# Patient Record
Sex: Male | Born: 1945
Health system: Southern US, Community
[De-identification: ages and names within clinical notes are randomized; demographics above are authoritative.]

## PROBLEM LIST (undated history)

## (undated) DIAGNOSIS — I2510 Atherosclerotic heart disease of native coronary artery without angina pectoris: Secondary | ICD-10-CM

## (undated) DIAGNOSIS — M109 Gout, unspecified: Secondary | ICD-10-CM

## (undated) DIAGNOSIS — M069 Rheumatoid arthritis, unspecified: Secondary | ICD-10-CM

## (undated) DIAGNOSIS — Z23 Encounter for immunization: Secondary | ICD-10-CM

## (undated) DIAGNOSIS — Z8249 Family history of ischemic heart disease and other diseases of the circulatory system: Secondary | ICD-10-CM

## (undated) DIAGNOSIS — R000 Tachycardia, unspecified: Secondary | ICD-10-CM

## (undated) DIAGNOSIS — I499 Cardiac arrhythmia, unspecified: Secondary | ICD-10-CM

## (undated) DIAGNOSIS — D649 Anemia, unspecified: Secondary | ICD-10-CM

## (undated) DIAGNOSIS — M62830 Muscle spasm of back: Secondary | ICD-10-CM

## (undated) DIAGNOSIS — R51 Headache: Secondary | ICD-10-CM

## (undated) DIAGNOSIS — I10 Essential (primary) hypertension: Secondary | ICD-10-CM

## (undated) DIAGNOSIS — D4620 Refractory anemia with excess of blasts, unspecified: Secondary | ICD-10-CM

## (undated) DIAGNOSIS — R0602 Shortness of breath: Secondary | ICD-10-CM

## (undated) HISTORY — DX: Headache: R51

## (undated) HISTORY — PX: KNEE ARTHROSCOPY: SHX127

## (undated) HISTORY — DX: Encounter for immunization: Z23

## (undated) HISTORY — DX: Rheumatoid arthritis, unspecified: M06.9

## (undated) HISTORY — PX: UMBILICAL HERNIA REPAIR: SHX196

## (undated) HISTORY — PX: CIRCUMCISION: SUR203

## (undated) HISTORY — DX: Muscle spasm of back: M62.830

## (undated) HISTORY — DX: Refractory anemia with excess of blasts, unspecified: D46.20

## (undated) HISTORY — PX: HERNIA REPAIR: SHX51

---

## 1999-04-08 ENCOUNTER — Encounter: Admission: RE | Admit: 1999-04-08 | Discharge: 1999-04-22 | Payer: Self-pay | Admitting: *Deleted

## 2007-12-21 ENCOUNTER — Encounter: Admission: RE | Admit: 2007-12-21 | Discharge: 2007-12-21 | Payer: Self-pay | Admitting: General Surgery

## 2007-12-22 ENCOUNTER — Ambulatory Visit (HOSPITAL_BASED_OUTPATIENT_CLINIC_OR_DEPARTMENT_OTHER): Admission: RE | Admit: 2007-12-22 | Discharge: 2007-12-22 | Payer: Self-pay | Admitting: General Surgery

## 2008-04-23 ENCOUNTER — Encounter: Admission: RE | Admit: 2008-04-23 | Discharge: 2008-04-23 | Payer: Self-pay | Admitting: Emergency Medicine

## 2008-06-11 ENCOUNTER — Encounter: Admission: RE | Admit: 2008-06-11 | Discharge: 2008-06-11 | Payer: Self-pay | Admitting: Rheumatology

## 2010-12-06 DIAGNOSIS — D4620 Refractory anemia with excess of blasts, unspecified: Secondary | ICD-10-CM

## 2010-12-06 DIAGNOSIS — D46Z Other myelodysplastic syndromes: Secondary | ICD-10-CM

## 2010-12-06 HISTORY — DX: Refractory anemia with excess of blasts, unspecified: D46.20

## 2010-12-06 HISTORY — DX: Other myelodysplastic syndromes: D46.Z

## 2011-02-26 ENCOUNTER — Other Ambulatory Visit: Payer: Self-pay | Admitting: Oncology

## 2011-02-26 ENCOUNTER — Encounter: Payer: Self-pay | Admitting: Oncology

## 2011-02-26 ENCOUNTER — Encounter (HOSPITAL_BASED_OUTPATIENT_CLINIC_OR_DEPARTMENT_OTHER): Payer: 59 | Admitting: Oncology

## 2011-02-26 DIAGNOSIS — D649 Anemia, unspecified: Secondary | ICD-10-CM

## 2011-02-26 LAB — CBC & DIFF AND RETIC
Basophils Absolute: 0 10*3/uL (ref 0.0–0.1)
Eosinophils Absolute: 0 10*3/uL (ref 0.0–0.5)
HCT: 30.8 % — ABNORMAL LOW (ref 38.4–49.9)
HGB: 10.3 g/dL — ABNORMAL LOW (ref 13.0–17.1)
Immature Retic Fract: 12.5 % (ref 0.00–13.40)
LYMPH%: 25.3 % (ref 14.0–49.0)
MONO#: 0.3 10*3/uL (ref 0.1–0.9)
NEUT#: 2.8 10*3/uL (ref 1.5–6.5)
NEUT%: 66.8 % (ref 39.0–75.0)
Platelets: 118 10*3/uL — ABNORMAL LOW (ref 140–400)
WBC: 4.2 10*3/uL (ref 4.0–10.3)

## 2011-02-26 LAB — MORPHOLOGY

## 2011-03-02 LAB — ERYTHROPOIETIN: Erythropoietin: 90.3 m[IU]/mL — ABNORMAL HIGH (ref 2.6–34.0)

## 2011-03-02 LAB — COMPREHENSIVE METABOLIC PANEL
Albumin: 4.8 g/dL (ref 3.5–5.2)
Alkaline Phosphatase: 61 U/L (ref 39–117)
Glucose, Bld: 121 mg/dL — ABNORMAL HIGH (ref 70–99)
Potassium: 3.9 mEq/L (ref 3.5–5.3)
Sodium: 141 mEq/L (ref 135–145)
Total Protein: 7.5 g/dL (ref 6.0–8.3)

## 2011-03-02 LAB — FOLATE: Folate: >20 ng/mL

## 2011-03-02 LAB — IRON AND TIBC: Iron: 169 ug/dL — ABNORMAL HIGH (ref 42–165)

## 2011-03-02 LAB — FERRITIN: Ferritin: 269 ng/mL (ref 22–322)

## 2011-03-02 LAB — METHYLMALONIC ACID, SERUM: Methylmalonic Acid, Quantitative: 177 nmol/L (ref 87–318)

## 2011-03-02 LAB — HAPTOGLOBIN: Haptoglobin: 90 mg/dL (ref 16–200)

## 2011-03-02 LAB — VITAMIN B12: Vitamin B-12: 358 pg/mL (ref 211–911)

## 2011-04-16 ENCOUNTER — Other Ambulatory Visit (HOSPITAL_COMMUNITY)
Admission: RE | Admit: 2011-04-16 | Discharge: 2011-04-16 | Disposition: A | Discharge status: Home / Self Care | Payer: 59 | Source: Ambulatory Visit | Attending: Oncology | Admitting: Oncology

## 2011-04-16 ENCOUNTER — Other Ambulatory Visit: Payer: Self-pay | Admitting: Oncology

## 2011-04-16 ENCOUNTER — Encounter (HOSPITAL_BASED_OUTPATIENT_CLINIC_OR_DEPARTMENT_OTHER): Payer: 59 | Admitting: Oncology

## 2011-04-16 DIAGNOSIS — D61818 Other pancytopenia: Secondary | ICD-10-CM

## 2011-04-16 DIAGNOSIS — D649 Anemia, unspecified: Secondary | ICD-10-CM

## 2011-04-16 LAB — CBC WITH DIFFERENTIAL/PLATELET
Basophils Absolute: 0 10*3/uL (ref 0.0–0.1)
Eosinophils Absolute: 0 10*3/uL (ref 0.0–0.5)
HGB: 9.4 g/dL — ABNORMAL LOW (ref 13.0–17.1)
NEUT#: 2.4 10*3/uL (ref 1.5–6.5)
RDW: 25.6 % — ABNORMAL HIGH (ref 11.0–14.6)
WBC: 3.7 10*3/uL — ABNORMAL LOW (ref 4.0–10.3)
lymph#: 1 10*3/uL (ref 0.9–3.3)
nRBC: 0 % (ref 0–0)

## 2011-04-16 LAB — CHCC SMEAR

## 2011-04-20 ENCOUNTER — Encounter (HOSPITAL_BASED_OUTPATIENT_CLINIC_OR_DEPARTMENT_OTHER): Payer: 59 | Admitting: Oncology

## 2011-04-20 DIAGNOSIS — D469 Myelodysplastic syndrome, unspecified: Secondary | ICD-10-CM

## 2011-04-20 DIAGNOSIS — I10 Essential (primary) hypertension: Secondary | ICD-10-CM

## 2011-04-20 DIAGNOSIS — M069 Rheumatoid arthritis, unspecified: Secondary | ICD-10-CM

## 2011-04-20 NOTE — Op Note (Signed)
NAME:  Eric [name], Eric [name] NO.:  [id number]   MEDICAL RECORD NO.:  [id number]          PATIENT TYPE:  AMB   LOCATION:  DSC                          FACILITY:  [facility]   PHYSICIAN:  Adolph Pollack, M.D.DATE OF BIRTH:  01/21/1946   DATE OF PROCEDURE:  12/22/2007  DATE OF DISCHARGE:                               OPERATIVE REPORT   PREOPERATIVE DIAGNOSIS:  Chronically incarcerated umbilical hernia.   POSTOPERATIVE DIAGNOSIS:  Chronically incarcerated umbilical hernia.   PROCEDURE:  Umbilical hernia repair with mesh.   SURGEON:  Adolph Pollack, M.D.   ANESTHESIA:  General.   INDICATIONS:  This is 64 year old male with an umbilical hernia.  It has  become somewhat larger in size and has tenting of the skin.  He now  presents for repair.  We discussed the procedure, risks and aftercare  preoperatively.   TECHNIQUE:  He is seen in the holding area and brought to the operating  room, placed supine on the operating table and general anesthetic was  administered.  The hair in the periumbilical area was clipped and the  area was sterilely prepped and draped.  Marcaine 0.5% plain Marcaine  solution was infiltrated in the periumbilical region superficially and  deep.  A subumbilical curvilinear incision was made through skin and  subcutaneous tissues until the fascia was identified.  The umbilicus was  encircled and dissected free from the fascia after I was able to reduce  the contents of the umbilical hernia.  This exposed the fascial defect.  Using electrocautery I dissected subcutaneous tissue off the fascia for  distance of 3-4 cm around the defect.  The fascial defect was then  primarily closed with 0-0 Surgilon sutures left long.  A piece of  polypropylene mesh brought to the field and cut to appropriate size to  allow for 3-4 cm overlap.  The primary repair sutures were threaded up  through the mesh and tied down anchoring the mesh directly over the  primary  repair.  The periphery of the mesh was then anchored to the  fascia with a running 0-0 Prolene suture.  Local anesthetic was then  infiltrated into the fascia.   Following this hemostasis was adequate.  The umbilicus was reimplanted  on the mesh with 3-0 Vicryl suture.  The subcutaneous tissue was then  closed over the mesh with a running 3-0 Vicryl suture.  The skin was  closed with 4-0 Monocryl subcuticular stitch followed by Steri-Strips  and sterile dressings.   He tolerated the procedure without apparent complications was taken  recovery in satisfactory condition.     Adolph Pollack, M.D.  Electronically Signed    TJR/MEDQ  D:  12/22/2007  T:  12/22/2007  Job:  [id number]   cc:   Oley Balm. Georgina Pillion, M.D.

## 2011-04-22 LAB — HEMOCHROMATOSIS DNA-PCR(C282Y,H63D)

## 2011-05-14 ENCOUNTER — Other Ambulatory Visit: Payer: Self-pay | Admitting: Oncology

## 2011-05-14 ENCOUNTER — Encounter (HOSPITAL_BASED_OUTPATIENT_CLINIC_OR_DEPARTMENT_OTHER): Payer: 59 | Admitting: Oncology

## 2011-05-14 DIAGNOSIS — I10 Essential (primary) hypertension: Secondary | ICD-10-CM

## 2011-05-14 DIAGNOSIS — D469 Myelodysplastic syndrome, unspecified: Secondary | ICD-10-CM

## 2011-05-14 DIAGNOSIS — M069 Rheumatoid arthritis, unspecified: Secondary | ICD-10-CM

## 2011-05-14 DIAGNOSIS — D696 Thrombocytopenia, unspecified: Secondary | ICD-10-CM

## 2011-05-14 DIAGNOSIS — D649 Anemia, unspecified: Secondary | ICD-10-CM

## 2011-05-14 LAB — CBC WITH DIFFERENTIAL/PLATELET
BASO%: 0.2 % (ref 0.0–2.0)
Basophils Absolute: 0 10*3/uL (ref 0.0–0.1)
EOS%: 0 % (ref 0.0–7.0)
HCT: 27.9 % — ABNORMAL LOW (ref 38.4–49.9)
LYMPH%: 26.2 % (ref 14.0–49.0)
MCH: 26.1 pg — ABNORMAL LOW (ref 27.2–33.4)
MCHC: 33.3 g/dL (ref 32.0–36.0)
MCV: 78.2 fL — ABNORMAL LOW (ref 79.3–98.0)
MONO%: 4.4 % (ref 0.0–14.0)
NEUT%: 69.2 % (ref 39.0–75.0)
Platelets: 100 10*3/uL — ABNORMAL LOW (ref 140–400)
lymph#: 1.1 10*3/uL (ref 0.9–3.3)

## 2011-05-28 ENCOUNTER — Encounter (HOSPITAL_BASED_OUTPATIENT_CLINIC_OR_DEPARTMENT_OTHER): Payer: 59 | Admitting: Oncology

## 2011-05-28 ENCOUNTER — Other Ambulatory Visit: Payer: Self-pay | Admitting: Oncology

## 2011-05-28 DIAGNOSIS — M069 Rheumatoid arthritis, unspecified: Secondary | ICD-10-CM

## 2011-05-28 DIAGNOSIS — I10 Essential (primary) hypertension: Secondary | ICD-10-CM

## 2011-05-28 DIAGNOSIS — D469 Myelodysplastic syndrome, unspecified: Secondary | ICD-10-CM

## 2011-05-28 LAB — CBC WITH DIFFERENTIAL/PLATELET
BASO%: 0.4 % (ref 0.0–2.0)
HCT: 29.6 % — ABNORMAL LOW (ref 38.4–49.9)
LYMPH%: 18 % (ref 14.0–49.0)
MCH: 26.1 pg — ABNORMAL LOW (ref 27.2–33.4)
MCHC: 33.4 g/dL (ref 32.0–36.0)
MCV: 78.1 fL — ABNORMAL LOW (ref 79.3–98.0)
MONO%: 4.9 % (ref 0.0–14.0)
NEUT%: 76.7 % — ABNORMAL HIGH (ref 39.0–75.0)
Platelets: 105 10*3/uL — ABNORMAL LOW (ref 140–400)
RBC: 3.79 10*6/uL — ABNORMAL LOW (ref 4.20–5.82)
nRBC: 0 % (ref 0–0)

## 2011-06-11 ENCOUNTER — Other Ambulatory Visit: Payer: Self-pay | Admitting: Oncology

## 2011-06-11 ENCOUNTER — Encounter (HOSPITAL_BASED_OUTPATIENT_CLINIC_OR_DEPARTMENT_OTHER): Payer: 59 | Admitting: Oncology

## 2011-06-11 DIAGNOSIS — I10 Essential (primary) hypertension: Secondary | ICD-10-CM

## 2011-06-11 DIAGNOSIS — D469 Myelodysplastic syndrome, unspecified: Secondary | ICD-10-CM

## 2011-06-11 DIAGNOSIS — M069 Rheumatoid arthritis, unspecified: Secondary | ICD-10-CM

## 2011-06-11 LAB — CBC WITH DIFFERENTIAL/PLATELET
Basophils Absolute: 0 10*3/uL (ref 0.0–0.1)
EOS%: 0 % (ref 0.0–7.0)
Eosinophils Absolute: 0 10*3/uL (ref 0.0–0.5)
HCT: 31 % — ABNORMAL LOW (ref 38.4–49.9)
HGB: 10.3 g/dL — ABNORMAL LOW (ref 13.0–17.1)
MCH: 26.2 pg — ABNORMAL LOW (ref 27.2–33.4)
MCV: 78.9 fL — ABNORMAL LOW (ref 79.3–98.0)
MONO%: 5.6 % (ref 0.0–14.0)
NEUT#: 3.6 10*3/uL (ref 1.5–6.5)
NEUT%: 66.1 % (ref 39.0–75.0)

## 2011-06-25 ENCOUNTER — Encounter (HOSPITAL_BASED_OUTPATIENT_CLINIC_OR_DEPARTMENT_OTHER): Payer: 59 | Admitting: Oncology

## 2011-06-25 ENCOUNTER — Other Ambulatory Visit: Payer: Self-pay | Admitting: Oncology

## 2011-06-25 DIAGNOSIS — I10 Essential (primary) hypertension: Secondary | ICD-10-CM

## 2011-06-25 DIAGNOSIS — D469 Myelodysplastic syndrome, unspecified: Secondary | ICD-10-CM

## 2011-06-25 DIAGNOSIS — M069 Rheumatoid arthritis, unspecified: Secondary | ICD-10-CM

## 2011-06-25 LAB — CBC WITH DIFFERENTIAL/PLATELET
BASO%: 0.2 % (ref 0.0–2.0)
Basophils Absolute: 0 10*3/uL (ref 0.0–0.1)
HCT: 31.1 % — ABNORMAL LOW (ref 38.4–49.9)
HGB: 10.5 g/dL — ABNORMAL LOW (ref 13.0–17.1)
LYMPH%: 23 % (ref 14.0–49.0)
MCH: 26.4 pg — ABNORMAL LOW (ref 27.2–33.4)
MCHC: 33.8 g/dL (ref 32.0–36.0)
MONO#: 0.5 10*3/uL (ref 0.1–0.9)
NEUT%: 67.1 % (ref 39.0–75.0)
Platelets: 91 10*3/uL — ABNORMAL LOW (ref 140–400)
WBC: 5.2 10*3/uL (ref 4.0–10.3)
lymph#: 1.2 10*3/uL (ref 0.9–3.3)

## 2011-07-05 ENCOUNTER — Other Ambulatory Visit: Payer: Self-pay | Admitting: Oncology

## 2011-07-05 ENCOUNTER — Encounter (HOSPITAL_BASED_OUTPATIENT_CLINIC_OR_DEPARTMENT_OTHER): Payer: 59 | Admitting: Oncology

## 2011-07-05 DIAGNOSIS — D469 Myelodysplastic syndrome, unspecified: Secondary | ICD-10-CM

## 2011-07-05 DIAGNOSIS — D696 Thrombocytopenia, unspecified: Secondary | ICD-10-CM

## 2011-07-05 DIAGNOSIS — I10 Essential (primary) hypertension: Secondary | ICD-10-CM

## 2011-07-05 DIAGNOSIS — M069 Rheumatoid arthritis, unspecified: Secondary | ICD-10-CM

## 2011-07-05 LAB — CBC WITH DIFFERENTIAL/PLATELET
Basophils Absolute: 0 10*3/uL (ref 0.0–0.1)
Eosinophils Absolute: 0 10*3/uL (ref 0.0–0.5)
HCT: 31.9 % — ABNORMAL LOW (ref 38.4–49.9)
HGB: 10.8 g/dL — ABNORMAL LOW (ref 13.0–17.1)
LYMPH%: 31.7 % (ref 14.0–49.0)
MCV: 82.8 fL (ref 79.3–98.0)
MONO#: 0.3 10*3/uL (ref 0.1–0.9)
MONO%: 5 % (ref 0.0–14.0)
NEUT#: 3.3 10*3/uL (ref 1.5–6.5)
NEUT%: 62.8 % (ref 39.0–75.0)
Platelets: 80 10*3/uL — ABNORMAL LOW (ref 140–400)
RBC: 3.86 10*6/uL — ABNORMAL LOW (ref 4.20–5.82)
WBC: 5.2 10*3/uL (ref 4.0–10.3)

## 2011-07-05 LAB — COMPREHENSIVE METABOLIC PANEL
AST: 15 U/L (ref 0–37)
Albumin: 4.6 g/dL (ref 3.5–5.2)
BUN: 21 mg/dL (ref 6–23)
CO2: 26 mEq/L (ref 19–32)
Calcium: 9.4 mg/dL (ref 8.4–10.5)
Chloride: 100 mEq/L (ref 96–112)
Creatinine, Ser: 0.72 mg/dL (ref 0.50–1.35)
Glucose, Bld: 136 mg/dL — ABNORMAL HIGH (ref 70–99)
Potassium: 3.9 mEq/L (ref 3.5–5.3)

## 2011-07-05 LAB — CHCC SMEAR

## 2011-07-05 LAB — LACTATE DEHYDROGENASE: LDH: 176 U/L (ref 94–250)

## 2011-08-06 ENCOUNTER — Encounter (HOSPITAL_BASED_OUTPATIENT_CLINIC_OR_DEPARTMENT_OTHER): Payer: 59 | Admitting: Oncology

## 2011-08-06 ENCOUNTER — Other Ambulatory Visit: Payer: Self-pay | Admitting: Oncology

## 2011-08-06 DIAGNOSIS — M069 Rheumatoid arthritis, unspecified: Secondary | ICD-10-CM

## 2011-08-06 DIAGNOSIS — I10 Essential (primary) hypertension: Secondary | ICD-10-CM

## 2011-08-06 DIAGNOSIS — D696 Thrombocytopenia, unspecified: Secondary | ICD-10-CM

## 2011-08-06 DIAGNOSIS — D469 Myelodysplastic syndrome, unspecified: Secondary | ICD-10-CM

## 2011-08-06 LAB — CBC WITH DIFFERENTIAL/PLATELET
Basophils Absolute: 0 10*3/uL (ref 0.0–0.1)
EOS%: 0 % (ref 0.0–7.0)
Eosinophils Absolute: 0 10*3/uL (ref 0.0–0.5)
HGB: 9.8 g/dL — ABNORMAL LOW (ref 13.0–17.1)
MCV: 77.1 fL — ABNORMAL LOW (ref 79.3–98.0)
MONO%: 8.6 % (ref 0.0–14.0)
NEUT#: 2.7 10*3/uL (ref 1.5–6.5)
RBC: 3.8 10*6/uL — ABNORMAL LOW (ref 4.20–5.82)
RDW: 25.7 % — ABNORMAL HIGH (ref 11.0–14.6)
lymph#: 1.6 10*3/uL (ref 0.9–3.3)
nRBC: 1 % — ABNORMAL HIGH (ref 0–0)

## 2011-08-06 LAB — TECHNOLOGIST REVIEW

## 2011-08-26 LAB — DIFFERENTIAL
Basophils Relative: 1
Eosinophils Absolute: 0
Eosinophils Relative: 0
Monocytes Absolute: 0.2
Monocytes Relative: 5

## 2011-08-26 LAB — COMPREHENSIVE METABOLIC PANEL
ALT: 31
Albumin: 4.1
Alkaline Phosphatase: 78
Chloride: 98
Potassium: 4.5
Sodium: 133 — ABNORMAL LOW
Total Bilirubin: 1.2
Total Protein: 7.7

## 2011-08-26 LAB — CBC
Hemoglobin: 11.8 — ABNORMAL LOW
Platelets: 240
RDW: 19.9 — ABNORMAL HIGH
WBC: 4.5

## 2011-09-03 ENCOUNTER — Encounter (HOSPITAL_BASED_OUTPATIENT_CLINIC_OR_DEPARTMENT_OTHER): Payer: 59 | Admitting: Oncology

## 2011-09-03 ENCOUNTER — Other Ambulatory Visit: Payer: Self-pay | Admitting: Oncology

## 2011-09-03 DIAGNOSIS — I10 Essential (primary) hypertension: Secondary | ICD-10-CM

## 2011-09-03 DIAGNOSIS — D696 Thrombocytopenia, unspecified: Secondary | ICD-10-CM

## 2011-09-03 DIAGNOSIS — M069 Rheumatoid arthritis, unspecified: Secondary | ICD-10-CM

## 2011-09-03 DIAGNOSIS — D469 Myelodysplastic syndrome, unspecified: Secondary | ICD-10-CM

## 2011-09-03 DIAGNOSIS — D4620 Refractory anemia with excess of blasts, unspecified: Secondary | ICD-10-CM

## 2011-09-03 DIAGNOSIS — I011 Acute rheumatic endocarditis: Secondary | ICD-10-CM

## 2011-09-03 LAB — CBC WITH DIFFERENTIAL/PLATELET
BASO%: 0.2 % (ref 0.0–2.0)
Basophils Absolute: 0 10*3/uL (ref 0.0–0.1)
EOS%: 0 % (ref 0.0–7.0)
HGB: 9.1 g/dL — ABNORMAL LOW (ref 13.0–17.1)
MCH: 25.2 pg — ABNORMAL LOW (ref 27.2–33.4)
MCHC: 32.7 g/dL (ref 32.0–36.0)
MCV: 77 fL — ABNORMAL LOW (ref 79.3–98.0)
MONO%: 7.9 % (ref 0.0–14.0)
RBC: 3.61 10*6/uL — ABNORMAL LOW (ref 4.20–5.82)
RDW: 26.2 % — ABNORMAL HIGH (ref 11.0–14.6)

## 2011-09-03 LAB — TECHNOLOGIST REVIEW

## 2011-09-17 ENCOUNTER — Encounter: Payer: Self-pay | Admitting: Oncology

## 2011-09-17 DIAGNOSIS — M069 Rheumatoid arthritis, unspecified: Secondary | ICD-10-CM | POA: Insufficient documentation

## 2011-09-17 DIAGNOSIS — D4620 Refractory anemia with excess of blasts, unspecified: Secondary | ICD-10-CM | POA: Insufficient documentation

## 2011-09-19 ENCOUNTER — Encounter: Payer: Self-pay | Admitting: Oncology

## 2011-10-07 ENCOUNTER — Other Ambulatory Visit: Payer: Self-pay | Admitting: Oncology

## 2011-10-07 ENCOUNTER — Telehealth: Payer: Self-pay | Admitting: *Deleted

## 2011-10-07 ENCOUNTER — Encounter (HOSPITAL_BASED_OUTPATIENT_CLINIC_OR_DEPARTMENT_OTHER): Payer: 59 | Admitting: Oncology

## 2011-10-07 DIAGNOSIS — D696 Thrombocytopenia, unspecified: Secondary | ICD-10-CM

## 2011-10-07 DIAGNOSIS — M069 Rheumatoid arthritis, unspecified: Secondary | ICD-10-CM

## 2011-10-07 DIAGNOSIS — D469 Myelodysplastic syndrome, unspecified: Secondary | ICD-10-CM

## 2011-10-07 DIAGNOSIS — D649 Anemia, unspecified: Secondary | ICD-10-CM

## 2011-10-07 DIAGNOSIS — Z23 Encounter for immunization: Secondary | ICD-10-CM

## 2011-10-07 DIAGNOSIS — I10 Essential (primary) hypertension: Secondary | ICD-10-CM

## 2011-10-07 LAB — MORPHOLOGY: PLT EST: DECREASED

## 2011-10-07 LAB — CBC WITH DIFFERENTIAL/PLATELET
Basophils Absolute: 0 10*3/uL (ref 0.0–0.1)
EOS%: 0 % (ref 0.0–7.0)
HGB: 9.8 g/dL — ABNORMAL LOW (ref 13.0–17.1)
MCH: 25.5 pg — ABNORMAL LOW (ref 27.2–33.4)
MCV: 76.1 fL — ABNORMAL LOW (ref 79.3–98.0)
MONO%: 7.6 % (ref 0.0–14.0)
NEUT%: 63.9 % (ref 39.0–75.0)
RDW: 26.7 % — ABNORMAL HIGH (ref 11.0–14.6)

## 2011-10-07 LAB — COMPREHENSIVE METABOLIC PANEL
AST: 14 U/L (ref 0–37)
Alkaline Phosphatase: 57 U/L (ref 39–117)
BUN: 23 mg/dL (ref 6–23)
Creatinine, Ser: 0.8 mg/dL (ref 0.50–1.35)
Total Bilirubin: 0.7 mg/dL (ref 0.3–1.2)

## 2011-10-29 ENCOUNTER — Other Ambulatory Visit: Payer: 59

## 2011-11-06 ENCOUNTER — Other Ambulatory Visit: Payer: Self-pay | Admitting: Oncology

## 2011-11-19 ENCOUNTER — Other Ambulatory Visit: Payer: Self-pay | Admitting: Gastroenterology

## 2011-11-26 ENCOUNTER — Other Ambulatory Visit (HOSPITAL_BASED_OUTPATIENT_CLINIC_OR_DEPARTMENT_OTHER): Payer: 59 | Admitting: Lab

## 2011-11-26 ENCOUNTER — Other Ambulatory Visit: Payer: Self-pay | Admitting: Oncology

## 2011-11-26 DIAGNOSIS — I10 Essential (primary) hypertension: Secondary | ICD-10-CM

## 2011-11-26 DIAGNOSIS — M069 Rheumatoid arthritis, unspecified: Secondary | ICD-10-CM

## 2011-11-26 DIAGNOSIS — D696 Thrombocytopenia, unspecified: Secondary | ICD-10-CM

## 2011-11-26 DIAGNOSIS — D469 Myelodysplastic syndrome, unspecified: Secondary | ICD-10-CM

## 2011-11-26 LAB — CBC WITH DIFFERENTIAL/PLATELET
Basophils Absolute: 0 10*3/uL (ref 0.0–0.1)
EOS%: 0 % (ref 0.0–7.0)
HCT: 29.4 % — ABNORMAL LOW (ref 38.4–49.9)
HGB: 9.8 g/dL — ABNORMAL LOW (ref 13.0–17.1)
MCH: 24.9 pg — ABNORMAL LOW (ref 27.2–33.4)
MCV: 74.6 fL — ABNORMAL LOW (ref 79.3–98.0)
MONO%: 9.2 % (ref 0.0–14.0)
NEUT%: 56.2 % (ref 39.0–75.0)
lymph#: 2.2 10*3/uL (ref 0.9–3.3)

## 2011-12-07 DIAGNOSIS — R000 Tachycardia, unspecified: Secondary | ICD-10-CM

## 2011-12-07 HISTORY — DX: Tachycardia, unspecified: R00.0

## 2011-12-10 ENCOUNTER — Telehealth: Payer: Self-pay | Admitting: Oncology

## 2011-12-10 ENCOUNTER — Telehealth: Payer: Self-pay | Admitting: *Deleted

## 2011-12-10 ENCOUNTER — Other Ambulatory Visit: Payer: Self-pay | Admitting: Oncology

## 2011-12-10 NOTE — Telephone Encounter (Signed)
Called pt's home and spoke w/ his wife.  Informed of Feb appt canceled and Lab for 12/13/11 also canceled since pt just had lab 2 weeks ago.  Informed of next lab appt on 02/07/12 and scheduling will call to make appt for labs and to see [name] again in May. Instructed to call for any questions or concerns prior to next appt..  Wife verbalized understanding.

## 2011-12-10 NOTE — Telephone Encounter (Signed)
Message from pt states he was called w/ appt to see [name] in February but he thought [name] told him he didn't need to see him again for 6 months from his last visit?  Pt's last visit was 10/12/11.  Note to [name] for clarification.

## 2011-12-10 NOTE — Telephone Encounter (Signed)
S/w the pt's wife and she is aware that we have cancelled the pt's feb md appt and kept the lab appt only.

## 2011-12-10 NOTE — Telephone Encounter (Signed)
Please call him.  He's right.   I will cancel the appointment in Feb.  I'll see him in May.  Thanks.

## 2011-12-13 ENCOUNTER — Other Ambulatory Visit: Payer: 59 | Admitting: Lab

## 2011-12-27 ENCOUNTER — Other Ambulatory Visit: Payer: 59 | Admitting: Lab

## 2011-12-27 ENCOUNTER — Ambulatory Visit: Payer: 59 | Admitting: Oncology

## 2012-01-12 ENCOUNTER — Other Ambulatory Visit: Payer: 59 | Admitting: Lab

## 2012-01-12 ENCOUNTER — Ambulatory Visit: Payer: 59 | Admitting: Oncology

## 2012-02-07 ENCOUNTER — Other Ambulatory Visit (HOSPITAL_BASED_OUTPATIENT_CLINIC_OR_DEPARTMENT_OTHER): Payer: Managed Care, Other (non HMO) | Admitting: Lab

## 2012-02-07 ENCOUNTER — Telehealth: Payer: Self-pay | Admitting: *Deleted

## 2012-02-07 ENCOUNTER — Telehealth: Payer: Self-pay | Admitting: Oncology

## 2012-02-07 ENCOUNTER — Other Ambulatory Visit: Payer: Self-pay | Admitting: Oncology

## 2012-02-07 ENCOUNTER — Other Ambulatory Visit: Payer: Self-pay | Admitting: *Deleted

## 2012-02-07 DIAGNOSIS — D649 Anemia, unspecified: Secondary | ICD-10-CM

## 2012-02-07 DIAGNOSIS — D469 Myelodysplastic syndrome, unspecified: Secondary | ICD-10-CM

## 2012-02-07 DIAGNOSIS — D696 Thrombocytopenia, unspecified: Secondary | ICD-10-CM

## 2012-02-07 LAB — CBC WITH DIFFERENTIAL/PLATELET
BASO%: 0.2 % (ref 0.0–2.0)
Basophils Absolute: 0 10*3/uL (ref 0.0–0.1)
HCT: 29.1 % — ABNORMAL LOW (ref 38.4–49.9)
HGB: 9.7 g/dL — ABNORMAL LOW (ref 13.0–17.1)
LYMPH%: 21.6 % (ref 14.0–49.0)
MCHC: 33.3 g/dL (ref 32.0–36.0)
MONO#: 0.3 10*3/uL (ref 0.1–0.9)
NEUT%: 72.9 % (ref 39.0–75.0)
Platelets: 70 10*3/uL — ABNORMAL LOW (ref 140–400)
WBC: 5.5 10*3/uL (ref 4.0–10.3)

## 2012-02-07 LAB — MORPHOLOGY: PLT EST: DECREASED

## 2012-02-07 NOTE — Telephone Encounter (Signed)
Message copied by Wende Mott on Mon Feb 07, 2012  1:27 PM ------      Message from: HA, Raliegh Ip T      Created: Mon Feb 07, 2012  9:49 AM       Please call pt.  His plt has dropped a little bit.  A scheduler will call him to have more frequent lab.  If he has sign of bleeding, he should let us know.   Thanks.

## 2012-02-07 NOTE — Telephone Encounter (Signed)
Spoke w/ wife, Kathie Rhodes.  Informed of Platelet count dropping a little to 70 and Dr. [name] order to check CBC every 2 weeks.  Instructed to call for any signs of bleeding and to expect call from scheduling regarding lab appts.  She verbalized understanding.

## 2012-02-07 NOTE — Telephone Encounter (Signed)
called pts home s/w wife and provded appts for 03/18-05/02

## 2012-02-21 ENCOUNTER — Other Ambulatory Visit (HOSPITAL_BASED_OUTPATIENT_CLINIC_OR_DEPARTMENT_OTHER): Payer: Managed Care, Other (non HMO) | Admitting: Lab

## 2012-02-21 ENCOUNTER — Telehealth: Payer: Self-pay

## 2012-02-21 DIAGNOSIS — D469 Myelodysplastic syndrome, unspecified: Secondary | ICD-10-CM

## 2012-02-21 DIAGNOSIS — D649 Anemia, unspecified: Secondary | ICD-10-CM

## 2012-02-21 DIAGNOSIS — D696 Thrombocytopenia, unspecified: Secondary | ICD-10-CM

## 2012-02-21 LAB — CBC WITH DIFFERENTIAL/PLATELET
EOS%: 0 % (ref 0.0–7.0)
LYMPH%: 31.3 % (ref 14.0–49.0)
MCH: 25.1 pg — ABNORMAL LOW (ref 27.2–33.4)
MCV: 75.9 fL — ABNORMAL LOW (ref 79.3–98.0)
MONO%: 8.3 % (ref 0.0–14.0)
Platelets: 75 10*3/uL — ABNORMAL LOW (ref 140–400)
RBC: 3.78 10*6/uL — ABNORMAL LOW (ref 4.20–5.82)
RDW: 26.3 % — ABNORMAL HIGH (ref 11.0–14.6)
nRBC: 0 % (ref 0–0)

## 2012-02-21 NOTE — Telephone Encounter (Signed)
Message copied by Kallie Locks on Mon Feb 21, 2012 11:13 AM ------      Message from: HA, Raliegh Ip T      Created: Mon Feb 21, 2012  8:38 AM       Please call pt.  His MDS is stable (Hgb, Plt are still low but stable).  Continue observation.  Thanks.

## 2012-02-21 NOTE — Telephone Encounter (Signed)
Message copied by Kallie Locks on Mon Feb 21, 2012 11:08 AM ------      Message from: HA, Raliegh Ip T      Created: Mon Feb 21, 2012  8:38 AM       Please call pt.  His MDS is stable (Hgb, Plt are still low but stable).  Continue observation.  Thanks.

## 2012-03-06 ENCOUNTER — Telehealth: Payer: Self-pay

## 2012-03-06 ENCOUNTER — Other Ambulatory Visit (HOSPITAL_BASED_OUTPATIENT_CLINIC_OR_DEPARTMENT_OTHER): Payer: Medicare Other | Admitting: Lab

## 2012-03-06 DIAGNOSIS — D649 Anemia, unspecified: Secondary | ICD-10-CM

## 2012-03-06 DIAGNOSIS — D6949 Other primary thrombocytopenia: Secondary | ICD-10-CM

## 2012-03-06 DIAGNOSIS — D469 Myelodysplastic syndrome, unspecified: Secondary | ICD-10-CM

## 2012-03-06 DIAGNOSIS — D696 Thrombocytopenia, unspecified: Secondary | ICD-10-CM

## 2012-03-06 LAB — CBC WITH DIFFERENTIAL/PLATELET
BASO%: 0.3 % (ref 0.0–2.0)
Basophils Absolute: 0 10*3/uL (ref 0.0–0.1)
EOS%: 0 % (ref 0.0–7.0)
HCT: 29.4 % — ABNORMAL LOW (ref 38.4–49.9)
HGB: 9.7 g/dL — ABNORMAL LOW (ref 13.0–17.1)
MCH: 25.5 pg — ABNORMAL LOW (ref 27.2–33.4)
MCHC: 33 g/dL (ref 32.0–36.0)
MCV: 77.2 fL — ABNORMAL LOW (ref 79.3–98.0)
MONO%: 7.6 % (ref 0.0–14.0)
NEUT%: 57.4 % (ref 39.0–75.0)
RDW: 26.9 % — ABNORMAL HIGH (ref 11.0–14.6)
lymph#: 2.1 10*3/uL (ref 0.9–3.3)

## 2012-03-06 LAB — TECHNOLOGIST REVIEW

## 2012-03-06 NOTE — Telephone Encounter (Signed)
Message copied by Kallie Locks on Mon Mar 06, 2012  9:58 AM ------      Message from: HA, Raliegh Ip T      Created: Mon Mar 06, 2012  8:32 AM       Please call patient.  His anemia and low platelet are stable from his MDS.  Will continue to monitor.  Thanks.  Patient maybe waiting out in the waiting room for his result.

## 2012-03-06 NOTE — Telephone Encounter (Signed)
Message copied by Kallie Locks on Mon Mar 06, 2012  1:57 PM ------      Message from: HA, Raliegh Ip T      Created: Mon Mar 06, 2012  8:32 AM       Please call patient.  His anemia and low platelet are stable from his MDS.  Will continue to monitor.  Thanks.  Patient maybe waiting out in the waiting room for his result.

## 2012-03-07 ENCOUNTER — Telehealth: Payer: Self-pay

## 2012-03-07 NOTE — Telephone Encounter (Signed)
Message copied by Kallie Locks on Tue Mar 07, 2012  1:27 PM ------      Message from: HA, Raliegh Ip T      Created: Mon Mar 06, 2012  8:32 AM       Please call patient.  His anemia and low platelet are stable from his MDS.  Will continue to monitor.  Thanks.  Patient maybe waiting out in the waiting room for his result.

## 2012-03-07 NOTE — Telephone Encounter (Signed)
Message copied by Kallie Locks on Tue Mar 07, 2012 12:30 PM ------      Message from: HA, Raliegh Ip T      Created: Mon Mar 06, 2012  8:32 AM       Please call patient.  His anemia and low platelet are stable from his MDS.  Will continue to monitor.  Thanks.  Patient maybe waiting out in the waiting room for his result.

## 2012-03-20 ENCOUNTER — Other Ambulatory Visit: Payer: Medicare Other | Admitting: Lab

## 2012-03-20 DIAGNOSIS — D696 Thrombocytopenia, unspecified: Secondary | ICD-10-CM

## 2012-03-20 DIAGNOSIS — D649 Anemia, unspecified: Secondary | ICD-10-CM

## 2012-03-20 DIAGNOSIS — D469 Myelodysplastic syndrome, unspecified: Secondary | ICD-10-CM

## 2012-03-20 LAB — CBC WITH DIFFERENTIAL/PLATELET
BASO%: 0.2 % (ref 0.0–2.0)
EOS%: 0 % (ref 0.0–7.0)
Eosinophils Absolute: 0 10*3/uL (ref 0.0–0.5)
LYMPH%: 32.8 % (ref 14.0–49.0)
MCHC: 33.4 g/dL (ref 32.0–36.0)
MCV: 76.5 fL — ABNORMAL LOW (ref 79.3–98.0)
MONO%: 12.3 % (ref 0.0–14.0)
NEUT#: 3.6 10*3/uL (ref 1.5–6.5)
RBC: 3.75 10*6/uL — ABNORMAL LOW (ref 4.20–5.82)
RDW: 26.4 % — ABNORMAL HIGH (ref 11.0–14.6)

## 2012-03-20 LAB — TECHNOLOGIST REVIEW: Technologist Review: 4

## 2012-04-06 ENCOUNTER — Ambulatory Visit (HOSPITAL_BASED_OUTPATIENT_CLINIC_OR_DEPARTMENT_OTHER): Payer: Medicare Other | Admitting: Oncology

## 2012-04-06 ENCOUNTER — Telehealth: Payer: Self-pay | Admitting: Oncology

## 2012-04-06 ENCOUNTER — Other Ambulatory Visit (HOSPITAL_BASED_OUTPATIENT_CLINIC_OR_DEPARTMENT_OTHER): Payer: Medicare Other | Admitting: Lab

## 2012-04-06 VITALS — BP 136/73 | HR 63 | Temp 97.0°F | Ht 68.0 in | Wt 204.3 lb

## 2012-04-06 DIAGNOSIS — M069 Rheumatoid arthritis, unspecified: Secondary | ICD-10-CM

## 2012-04-06 DIAGNOSIS — I10 Essential (primary) hypertension: Secondary | ICD-10-CM

## 2012-04-06 DIAGNOSIS — D696 Thrombocytopenia, unspecified: Secondary | ICD-10-CM

## 2012-04-06 DIAGNOSIS — R0602 Shortness of breath: Secondary | ICD-10-CM

## 2012-04-06 DIAGNOSIS — D469 Myelodysplastic syndrome, unspecified: Secondary | ICD-10-CM

## 2012-04-06 DIAGNOSIS — D638 Anemia in other chronic diseases classified elsewhere: Secondary | ICD-10-CM

## 2012-04-06 LAB — CBC WITH DIFFERENTIAL/PLATELET
BASO%: 0.3 % (ref 0.0–2.0)
EOS%: 0 % (ref 0.0–7.0)
HCT: 28.1 % — ABNORMAL LOW (ref 38.4–49.9)
MCH: 25.3 pg — ABNORMAL LOW (ref 27.2–33.4)
MCHC: 33.5 g/dL (ref 32.0–36.0)
MONO#: 0.5 10*3/uL (ref 0.1–0.9)
RBC: 3.71 10*6/uL — ABNORMAL LOW (ref 4.20–5.82)
RDW: 27.2 % — ABNORMAL HIGH (ref 11.0–14.6)
WBC: 7.3 10*3/uL (ref 4.0–10.3)
lymph#: 2.1 10*3/uL (ref 0.9–3.3)
nRBC: 0 % (ref 0–0)

## 2012-04-06 LAB — COMPREHENSIVE METABOLIC PANEL
Albumin: 4.5 g/dL (ref 3.5–5.2)
Alkaline Phosphatase: 45 U/L (ref 39–117)
BUN: 30 mg/dL — ABNORMAL HIGH (ref 6–23)
CO2: 26 mEq/L (ref 19–32)
Glucose, Bld: 123 mg/dL — ABNORMAL HIGH (ref 70–99)
Potassium: 3.9 mEq/L (ref 3.5–5.3)
Total Bilirubin: 1 mg/dL (ref 0.3–1.2)
Total Protein: 6.9 g/dL (ref 6.0–8.3)

## 2012-04-06 LAB — MORPHOLOGY

## 2012-04-06 NOTE — Patient Instructions (Signed)
1.  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  - Continue to observe with monthly blood check.  - Once blood count is significantly lower (ANC <1; Hgb <8; or Plt <50), we may consider repeating bone marrow biopsy to see if MDS has progressed to higher risk. - Chemotherapy may be considered once MDS becomes more than low risk.

## 2012-04-06 NOTE — Telephone Encounter (Signed)
Gv pt appt for [id number]

## 2012-04-06 NOTE — Progress Notes (Signed)
[facility]  Telephone:([phone] Fax:(336) [phone]   OFFICE PROGRESS NOTE   Cc:  Daphene Calamity, MD, MD  DIAGNOSIS:  MDS; low to intermediate 1 risk; normal cytogenetics and FISH panel for MDS.   CURRENT THERAPY: watchful observation.   INTERVAL HISTORY: Eric [name] 66 y.o. male returns for regular follow up with his wife.  Since last visit, he had retired from his [employer] job in March 2013.  He is very active around the house performing chores, working in the yard.  He has mild SOB, and DOE with heavy exertion; however, this has been quite stable and chronic.  He denies PND, orthopnea, pedal edema.    Patient denies fatigue, headache, visual changes, confusion, drenching night sweats, palpable lymph node swelling, mucositis, odynophagia, dysphagia, nausea vomiting, jaundice, chest pain, palpitation, productive cough, gum bleeding, epistaxis, hematemesis, hemoptysis, abdominal pain, abdominal swelling, early satiety, melena, hematochezia, hematuria, skin rash, spontaneous bleeding, joint swelling, joint pain, heat or cold intolerance, bowel bladder incontinence, back pain, focal motor weakness, paresthesia, depression, suicidal or homocidal ideation, feeling hopelessness.   Past Medical History  Diagnosis Date  . MDS (myelodysplastic syndrome), low grade 2012    on observation  . Rheumatoid arthritis     was on MTX until MDS dx    No past surgical history on file.  Current Outpatient Prescriptions  Medication Sig Dispense Refill  . acetaminophen (TYLENOL) 325 MG tablet Take 650 mg by mouth every 6 (six) hours as needed.      . predniSONE (DELTASONE) 10 MG tablet Take 10 mg by mouth daily.        ALLERGIES:   has no known allergies.  REVIEW OF SYSTEMS:  The rest of the 14-point review of system was negative.   Filed Vitals:   04/06/12 0815  BP: 136/73  Pulse: 63  Temp: 97 F (36.1 C)   Wt Readings from Last 3 Encounters:  04/06/12 204 lb  4.8 oz (92.67 kg)   ECOG Performance status: 0  PHYSICAL EXAMINATION:   General:  Mildly obese man, in no acute distress.  Eyes:  no scleral icterus.  ENT:  There were no oropharyngeal lesions.  Neck was without thyromegaly.  Lymphatics:  Negative cervical, supraclavicular or axillary adenopathy.  Respiratory: lungs were clear bilaterally without wheezing or crackles.  Cardiovascular:  Regular rate and rhythm, S1/S2, without murmur, rub or gallop.  There was no pedal edema.  GI:  abdomen was soft, flat, nontender, nondistended, without organomegaly.  Muscoloskeletal:  no spinal tenderness of palpation of vertebral spine.  Skin exam was without echymosis, petichae.  Neuro exam was nonfocal.  Patient was able to get on and off exam table without assistance.  Gait was normal.  Patient was alerted and oriented.  Attention was good.   Language was appropriate.  Mood was normal without depression.  Speech was not pressured.  Thought content was not tangential.     LABORATORY/RADIOLOGY DATA:  Lab Results  Component Value Date   WBC 7.3 04/06/2012   HGB 9.4* 04/06/2012   HCT 28.1* 04/06/2012   PLT 100* 04/06/2012   GLUCOSE 144* 10/07/2011   GLUCOSE 144* 10/07/2011   ALKPHOS 57 10/07/2011   ALKPHOS 57 10/07/2011   ALT 17 10/07/2011   ALT 17 10/07/2011   AST 14 10/07/2011   AST 14 10/07/2011   NA 137 10/07/2011   NA 137 10/07/2011   K 4.1 10/07/2011   K 4.1 10/07/2011   CL 102 10/07/2011   CL  102 10/07/2011   CREATININE 0.80 10/07/2011   CREATININE 0.80 10/07/2011   BUN 23 10/07/2011   BUN 23 10/07/2011   CO2 24 10/07/2011   CO2 24 10/07/2011    ASSESSMENT AND PLAN:  1.  MDS:  Stable CBC.  No clear evidence of progression of his MDS to a higher risk category.  He does have slight anemia; but it is not severe enough for pRBC transfusion.  I again recommended watchful observation.  In the future, if he develops worsened cytopenia (ANC <1; Hgb <8; or plt <50K), we may consider repeating bone marrow biopsy to ensure  that his MDS has not progressed.  No chemotherapy (hypomethylating agent) is indicated at this time given only slight anemia.   2.  Slight anemia:  From #1.    3.  SOB:  From #1 and 2.  Low clinical suspicion for cardiac or pulmonary causes.    4.  Rheumatoid arthritis:  Continue low dose Prednisone per PCP.   5.  Follow up:  Lab only appointment monthly.  Return to clinic in about 6 months.   6.  Age-appropriate cancer screening:  He had negative EGD and colonoscopy with Dr. Marjorie Smolder in 11/2011.  Next colonscopy is due in 11/2021.   The length of time of the face-to-face encounter was 15 minutes. More than 50% of time was spent counseling and coordination of care.

## 2012-04-17 ENCOUNTER — Other Ambulatory Visit: Payer: Managed Care, Other (non HMO) | Admitting: Lab

## 2012-05-08 ENCOUNTER — Telehealth: Payer: Self-pay | Admitting: *Deleted

## 2012-05-08 ENCOUNTER — Other Ambulatory Visit (HOSPITAL_BASED_OUTPATIENT_CLINIC_OR_DEPARTMENT_OTHER): Payer: Medicare Other

## 2012-05-08 DIAGNOSIS — D469 Myelodysplastic syndrome, unspecified: Secondary | ICD-10-CM

## 2012-05-08 LAB — CBC WITH DIFFERENTIAL/PLATELET
Basophils Absolute: 0 10*3/uL (ref 0.0–0.1)
Eosinophils Absolute: 0 10*3/uL (ref 0.0–0.5)
HGB: 9.4 g/dL — ABNORMAL LOW (ref 13.0–17.1)
MCV: 76.2 fL — ABNORMAL LOW (ref 79.3–98.0)
MONO#: 0.5 10*3/uL (ref 0.1–0.9)
MONO%: 8.5 % (ref 0.0–14.0)
NEUT#: 3.5 10*3/uL (ref 1.5–6.5)
RBC: 3.69 10*6/uL — ABNORMAL LOW (ref 4.20–5.82)
RDW: 26.3 % — ABNORMAL HIGH (ref 11.0–14.6)
WBC: 6.2 10*3/uL (ref 4.0–10.3)
lymph#: 2.2 10*3/uL (ref 0.9–3.3)
nRBC: 0 % (ref 0–0)

## 2012-05-08 LAB — TECHNOLOGIST REVIEW

## 2012-05-08 NOTE — Telephone Encounter (Signed)
Spoke w/ pt in lobby this morning.  Gave him copy of labs and informed his Hgb stable and platelets slightly decreased per [name] and lab differences from his MDS.  Explained if platelets drop less than 50K,  [name] may repeat BMBx or start therapy.  At this point we will continue to observe and pt to keep lab appt next month as scheduled.  Pt verbalized understanding and denies any new problems or questions.

## 2012-05-08 NOTE — Telephone Encounter (Signed)
Message copied by Wende Mott on Mon May 08, 2012  9:06 AM ------      Message from: HA, Raliegh Ip T      Created: Mon May 08, 2012  8:43 AM       Please call pt.  His Hgb is stable; plt slightly decreases (from MDS).  Still plt above my threshold of 50K to repeat bone marrow biopsy or to start therapy.  I recommend to continue watchful observation.

## 2012-06-07 ENCOUNTER — Other Ambulatory Visit (HOSPITAL_BASED_OUTPATIENT_CLINIC_OR_DEPARTMENT_OTHER): Payer: Medicare Other

## 2012-06-07 ENCOUNTER — Telehealth: Payer: Self-pay

## 2012-06-07 DIAGNOSIS — D649 Anemia, unspecified: Secondary | ICD-10-CM

## 2012-06-07 DIAGNOSIS — D469 Myelodysplastic syndrome, unspecified: Secondary | ICD-10-CM

## 2012-06-07 DIAGNOSIS — D696 Thrombocytopenia, unspecified: Secondary | ICD-10-CM

## 2012-06-07 LAB — CBC WITH DIFFERENTIAL/PLATELET
Basophils Absolute: 0 10*3/uL (ref 0.0–0.1)
EOS%: 0 % (ref 0.0–7.0)
Eosinophils Absolute: 0 10*3/uL (ref 0.0–0.5)
HCT: 27.7 % — ABNORMAL LOW (ref 38.4–49.9)
HGB: 9.3 g/dL — ABNORMAL LOW (ref 13.0–17.1)
MCH: 25.3 pg — ABNORMAL LOW (ref 27.2–33.4)
MONO#: 0.6 10*3/uL (ref 0.1–0.9)
NEUT#: 4.2 10*3/uL (ref 1.5–6.5)
NEUT%: 59.1 % (ref 39.0–75.0)
RDW: 26.4 % — ABNORMAL HIGH (ref 11.0–14.6)
WBC: 7.2 10*3/uL (ref 4.0–10.3)
lymph#: 2.3 10*3/uL (ref 0.9–3.3)

## 2012-06-07 LAB — TECHNOLOGIST REVIEW: Technologist Review: 3

## 2012-06-07 NOTE — Telephone Encounter (Signed)
Message copied by Kallie Locks on Wed Jun 07, 2012 11:45 AM ------      Message from: HA, Raliegh Ip T      Created: Wed Jun 07, 2012  8:37 AM       Please call pt.  Stable anemia and thrombocytopenia (from MDS).  Continue observation.  Thanks.

## 2012-06-28 ENCOUNTER — Other Ambulatory Visit: Payer: Self-pay | Admitting: Family Medicine

## 2012-06-28 ENCOUNTER — Ambulatory Visit
Admission: RE | Admit: 2012-06-28 | Discharge: 2012-06-28 | Disposition: A | Discharge status: Home / Self Care | Payer: Medicare Other | Source: Ambulatory Visit | Attending: Family Medicine | Admitting: Family Medicine

## 2012-06-28 DIAGNOSIS — R0602 Shortness of breath: Secondary | ICD-10-CM

## 2012-06-29 ENCOUNTER — Telehealth: Payer: Self-pay | Admitting: *Deleted

## 2012-06-29 NOTE — Telephone Encounter (Signed)
Pt left VM states returning call to [name].

## 2012-06-30 ENCOUNTER — Telehealth: Payer: Self-pay | Admitting: *Deleted

## 2012-06-30 NOTE — Telephone Encounter (Signed)
Returned pt's call.  He reports he saw Dr. Gerri Spore at [facility] (in place of Dr. Paulino Rily) who was out for a sore throat.  His hgb was 8.8 and they instructed pt to f/u w/ [name].  Pt states he feels fine and verbalizes understanding to keep his lab appt here next week on 8/02.   CBC received from [place] at [place] [facility].  Hgb was 8.8 and Plats 118 on 06/28/12.Marland Kitchen

## 2012-07-07 ENCOUNTER — Other Ambulatory Visit (HOSPITAL_BASED_OUTPATIENT_CLINIC_OR_DEPARTMENT_OTHER): Payer: Medicare Other | Admitting: Lab

## 2012-07-07 ENCOUNTER — Telehealth: Payer: Self-pay | Admitting: *Deleted

## 2012-07-07 DIAGNOSIS — D469 Myelodysplastic syndrome, unspecified: Secondary | ICD-10-CM

## 2012-07-07 DIAGNOSIS — D696 Thrombocytopenia, unspecified: Secondary | ICD-10-CM

## 2012-07-07 LAB — CBC WITH DIFFERENTIAL/PLATELET
BASO%: 0.2 % (ref 0.0–2.0)
EOS%: 0 % (ref 0.0–7.0)
LYMPH%: 30.3 % (ref 14.0–49.0)
MCHC: 33.2 g/dL (ref 32.0–36.0)
MCV: 75.5 fL — ABNORMAL LOW (ref 79.3–98.0)
MONO%: 7.5 % (ref 0.0–14.0)
NEUT#: 5.3 10*3/uL (ref 1.5–6.5)
Platelets: 95 10*3/uL — ABNORMAL LOW (ref 140–400)
RBC: 3.67 10*6/uL — ABNORMAL LOW (ref 4.20–5.82)
RDW: 26.4 % — ABNORMAL HIGH (ref 11.0–14.6)
nRBC: 0 % (ref 0–0)

## 2012-07-07 NOTE — Telephone Encounter (Signed)
Spoke w/ pt and wife in lobby this morning.  Gave him results of CBC,  Informed stable and to keep monthly lab appts as scheduled. Pt denies any new symptoms or problems,  Is recovering from recent URI.

## 2012-07-07 NOTE — Telephone Encounter (Signed)
Message copied by Wende Mott on Fri Jul 07, 2012 12:06 PM ------      Message from: [name] T      Created: Fri Jul 07, 2012  8:46 AM       Please call pt.  His MDS is stable (stable Hgb and Plt).  Continue observation.  Thanks.

## 2012-08-08 ENCOUNTER — Other Ambulatory Visit (HOSPITAL_BASED_OUTPATIENT_CLINIC_OR_DEPARTMENT_OTHER): Payer: Medicare Other | Admitting: Lab

## 2012-08-08 DIAGNOSIS — D469 Myelodysplastic syndrome, unspecified: Secondary | ICD-10-CM

## 2012-08-08 LAB — CBC WITH DIFFERENTIAL/PLATELET
Basophils Absolute: 0 10*3/uL (ref 0.0–0.1)
EOS%: 0 % (ref 0.0–7.0)
LYMPH%: 35.3 % (ref 14.0–49.0)
MCH: 25.3 pg — ABNORMAL LOW (ref 27.2–33.4)
MCV: 75.8 fL — ABNORMAL LOW (ref 79.3–98.0)
MONO%: 6.5 % (ref 0.0–14.0)
Platelets: 79 10*3/uL — ABNORMAL LOW (ref 140–400)
RBC: 3.76 10*6/uL — ABNORMAL LOW (ref 4.20–5.82)
RDW: 26.1 % — ABNORMAL HIGH (ref 11.0–14.6)
nRBC: 0 % (ref 0–0)

## 2012-08-08 LAB — TECHNOLOGIST REVIEW

## 2012-08-22 ENCOUNTER — Telehealth: Payer: Self-pay

## 2012-08-22 NOTE — Telephone Encounter (Signed)
Message copied by Kallie Locks on Tue Aug 22, 2012  2:27 PM ------      Message from: Eric [name]      Created: Tue Aug 08, 2012 12:41 PM       Please call pt.  His plt has slightly decreased; but Hgb slightly improved.  I think that his MDS is still stable.  Continue observation for now.  Thanks.

## 2012-09-07 ENCOUNTER — Other Ambulatory Visit: Payer: Medicare Other | Admitting: Lab

## 2012-09-07 ENCOUNTER — Telehealth: Payer: Self-pay

## 2012-09-07 DIAGNOSIS — D469 Myelodysplastic syndrome, unspecified: Secondary | ICD-10-CM

## 2012-09-07 DIAGNOSIS — D696 Thrombocytopenia, unspecified: Secondary | ICD-10-CM

## 2012-09-07 LAB — CBC WITH DIFFERENTIAL/PLATELET
BASO%: 0.2 % (ref 0.0–2.0)
Basophils Absolute: 0 10*3/uL (ref 0.0–0.1)
EOS%: 0 % (ref 0.0–7.0)
HCT: 30.8 % — ABNORMAL LOW (ref 38.4–49.9)
HGB: 10.1 g/dL — ABNORMAL LOW (ref 13.0–17.1)
MCH: 25.4 pg — ABNORMAL LOW (ref 27.2–33.4)
MCHC: 32.8 g/dL (ref 32.0–36.0)
MCV: 77.6 fL — ABNORMAL LOW (ref 79.3–98.0)
MONO%: 8.3 % (ref 0.0–14.0)
NEUT%: 65.4 % (ref 39.0–75.0)
lymph#: 2.1 10*3/uL (ref 0.9–3.3)

## 2012-09-07 NOTE — Telephone Encounter (Signed)
Message copied by Kallie Locks on Thu Sep 07, 2012  4:54 PM ------      Message from: HA, Raliegh Ip T      Created: Thu Sep 07, 2012  9:04 AM       Please call pt.  His blood counts from MDS are stable.  Continue observation.  Thanks.

## 2012-10-09 ENCOUNTER — Other Ambulatory Visit (HOSPITAL_BASED_OUTPATIENT_CLINIC_OR_DEPARTMENT_OTHER): Payer: Medicare Other | Admitting: Lab

## 2012-10-09 ENCOUNTER — Telehealth: Payer: Self-pay | Admitting: Oncology

## 2012-10-09 ENCOUNTER — Ambulatory Visit (HOSPITAL_BASED_OUTPATIENT_CLINIC_OR_DEPARTMENT_OTHER): Payer: Medicare Other | Admitting: Oncology

## 2012-10-09 ENCOUNTER — Encounter: Payer: Self-pay | Admitting: Oncology

## 2012-10-09 VITALS — BP 157/71 | HR 62 | Temp 96.9°F | Resp 20 | Ht 68.0 in | Wt 205.6 lb

## 2012-10-09 DIAGNOSIS — D469 Myelodysplastic syndrome, unspecified: Secondary | ICD-10-CM

## 2012-10-09 DIAGNOSIS — D649 Anemia, unspecified: Secondary | ICD-10-CM

## 2012-10-09 DIAGNOSIS — M069 Rheumatoid arthritis, unspecified: Secondary | ICD-10-CM

## 2012-10-09 DIAGNOSIS — D4620 Refractory anemia with excess of blasts, unspecified: Secondary | ICD-10-CM

## 2012-10-09 LAB — COMPREHENSIVE METABOLIC PANEL (CC13)
AST: 12 U/L (ref 5–34)
Albumin: 4 g/dL (ref 3.5–5.0)
Alkaline Phosphatase: 55 U/L (ref 40–150)
Chloride: 103 mEq/L (ref 98–107)
Potassium: 3.8 mEq/L (ref 3.5–5.1)
Sodium: 139 mEq/L (ref 136–145)
Total Protein: 7.5 g/dL (ref 6.4–8.3)

## 2012-10-09 LAB — MORPHOLOGY: PLT EST: DECREASED

## 2012-10-09 LAB — CBC WITH DIFFERENTIAL/PLATELET
BASO%: 0.2 % (ref 0.0–2.0)
EOS%: 0 % (ref 0.0–7.0)
HCT: 29.3 % — ABNORMAL LOW (ref 38.4–49.9)
LYMPH%: 36.9 % (ref 14.0–49.0)
MCH: 24.6 pg — ABNORMAL LOW (ref 27.2–33.4)
MCHC: 32.4 g/dL (ref 32.0–36.0)
MCV: 75.9 fL — ABNORMAL LOW (ref 79.3–98.0)
MONO#: 0.5 10*3/uL (ref 0.1–0.9)
MONO%: 8.1 % (ref 0.0–14.0)
NEUT%: 54.8 % (ref 39.0–75.0)
Platelets: 100 10*3/uL — ABNORMAL LOW (ref 140–400)
RBC: 3.86 10*6/uL — ABNORMAL LOW (ref 4.20–5.82)
WBC: 6.5 10*3/uL (ref 4.0–10.3)
nRBC: 0 % (ref 0–0)

## 2012-10-09 NOTE — Telephone Encounter (Signed)
appts made and printed for pt aom °

## 2012-10-09 NOTE — Progress Notes (Signed)
[facility]  Telephone:(336) [phone] Fax:(336) [phone]   OFFICE PROGRESS NOTE   Cc:  No primary provider on file.  DIAGNOSIS:  MDS; low to intermediate 1 risk; normal cytogenetics and FISH panel for MDS.   CURRENT THERAPY: watchful observation.   INTERVAL HISTORY: Eric [name] 66 y.o. male returns for regular follow up with his wife. He is having more pain in his bilateral knees due to his RA. He has had multiple steroid injections without relief. He is now doing PT at home without much improvement. He thinks he may need to consider knee replacements if no improvement. He has mild SOB, and DOE with heavy exertion; however, this has been quite stable and chronic.  He denies PND, orthopnea, pedal edema.    Patient denies fatigue, headache, visual changes, confusion, drenching night sweats, palpable lymph node swelling, mucositis, odynophagia, dysphagia, nausea vomiting, jaundice, chest pain, palpitation, productive cough, gum bleeding, epistaxis, hematemesis, hemoptysis, abdominal pain, abdominal swelling, early satiety, melena, hematochezia, hematuria, skin rash, spontaneous bleeding, joint swelling, joint pain, heat or cold intolerance, bowel bladder incontinence, back pain, focal motor weakness, paresthesia, depression, suicidal or homocidal ideation, feeling hopelessness.   Past Medical History  Diagnosis Date  . MDS (myelodysplastic syndrome), low grade 2012    on observation  . Rheumatoid arthritis     was on MTX until MDS dx    History reviewed. No pertinent past surgical history.  Current Outpatient Prescriptions  Medication Sig Dispense Refill  . acetaminophen (TYLENOL) 325 MG tablet Take 650 mg by mouth every 6 (six) hours as needed.      . Calcium-Vitamin D (CALTRATE 600 PLUS-VIT D PO) Take by mouth.      . predniSONE (DELTASONE) 10 MG tablet Take 7.5 mg by mouth daily.       Marland Kitchen lisinopril-hydrochlorothiazide (PRINZIDE,ZESTORETIC) 20-25 MG per tablet  Take 1 tablet by mouth Daily.      . traMADol (ULTRAM) 50 MG tablet Take 50 mg by mouth Every 6 hours as needed.        ALLERGIES:   has no known allergies.  REVIEW OF SYSTEMS:  The rest of the 14-point review of system was negative.   Filed Vitals:   10/09/12 0839  BP: 157/71  Pulse: 62  Temp: 96.9 F (36.1 C)  Resp: 20   Wt Readings from Last 3 Encounters:  10/09/12 205 lb 9.6 oz (93.26 kg)  04/06/12 204 lb 4.8 oz (92.67 kg)   ECOG Performance status: 0  PHYSICAL EXAMINATION:   General:  Mildly obese man, in no acute distress.  Eyes:  no scleral icterus.  ENT:  There were no oropharyngeal lesions.  Neck was without thyromegaly.  Lymphatics:  Negative cervical, supraclavicular or axillary adenopathy.  Respiratory: lungs were clear bilaterally without wheezing or crackles.  Cardiovascular:  Regular rate and rhythm, S1/S2, without murmur, rub or gallop.  There was no pedal edema.  GI:  abdomen was soft, flat, nontender, nondistended, without organomegaly.  Muscoloskeletal:  no spinal tenderness of palpation of vertebral spine.  Skin exam was without echymosis, petichae.  Neuro exam was nonfocal.  Patient was able to get on and off exam table without assistance.  Gait was normal.  Patient was alerted and oriented.  Attention was good.   Language was appropriate.  Mood was normal without depression.  Speech was not pressured.  Thought content was not tangential.     LABORATORY/RADIOLOGY DATA:  Lab Results  Component Value Date   WBC 6.5  10/09/2012   HGB 9.5* 10/09/2012   HCT 29.3* 10/09/2012   PLT 100* 10/09/2012   GLUCOSE 134* 10/09/2012   ALKPHOS 55 10/09/2012   ALT 13 10/09/2012   AST 12 10/09/2012   NA 139 10/09/2012   K 3.8 10/09/2012   CL 103 10/09/2012   CREATININE 0.8 10/09/2012   BUN 18.0 10/09/2012   CO2 27 10/09/2012    ASSESSMENT AND PLAN:  1.  MDS:  Stable CBC.  No clear evidence of progression of his MDS to a higher risk category.  He does have slight anemia; but it is  not severe enough for pRBC transfusion.  I again recommended watchful observation.  In the future, if he develops worsened cytopenia (ANC <1; Hgb <8; or plt <50K), we may consider repeating bone marrow biopsy to ensure that his MDS has not progressed.  No chemotherapy (hypomethylating agent) is indicated at this time given only slight anemia.   2.  Slight anemia:  From #1.    3.  SOB:  From #1 and 2.  Low clinical suspicion for cardiac or pulmonary causes.    4.  Rheumatoid arthritis:  Continue low dose Prednisone per PCP. He may need bilateral knee replacements. Counts are acceptable at this point if he needs surgery.  5.  Follow up:  Lab only appointment monthly.  Return to clinic in about 6 months.   6.  Age-appropriate cancer screening:  He had negative EGD and colonoscopy with Dr. Marjorie Smolder in 11/2011.  Next colonscopy is due in 11/2021.   The length of time of the face-to-face encounter was 15 minutes. More than 50% of time was spent counseling and coordination of care.

## 2012-11-06 ENCOUNTER — Telehealth: Payer: Self-pay | Admitting: *Deleted

## 2012-11-06 ENCOUNTER — Other Ambulatory Visit (HOSPITAL_BASED_OUTPATIENT_CLINIC_OR_DEPARTMENT_OTHER): Payer: Medicare Other

## 2012-11-06 DIAGNOSIS — D4620 Refractory anemia with excess of blasts, unspecified: Secondary | ICD-10-CM

## 2012-11-06 LAB — CBC WITH DIFFERENTIAL/PLATELET
Basophils Absolute: 0 10*3/uL (ref 0.0–0.1)
EOS%: 0 % (ref 0.0–7.0)
HCT: 29.5 % — ABNORMAL LOW (ref 38.4–49.9)
HGB: 9.5 g/dL — ABNORMAL LOW (ref 13.0–17.1)
MCH: 24.5 pg — ABNORMAL LOW (ref 27.2–33.4)
MCV: 76.2 fL — ABNORMAL LOW (ref 79.3–98.0)
MONO%: 9.6 % (ref 0.0–14.0)
NEUT%: 55.6 % (ref 39.0–75.0)
Platelets: 82 10*3/uL — ABNORMAL LOW (ref 140–400)

## 2012-11-06 NOTE — Telephone Encounter (Signed)
Message copied by Wende Mott on Mon Nov 06, 2012 10:34 AM ------      Message from: Clenton Pare R      Created: Mon Nov 06, 2012  9:49 AM       Call pt. Hemoglobin is stable. Platelets down slightly to 82,000. Continue observation with monthly labs as scheduled.

## 2012-11-06 NOTE — Telephone Encounter (Signed)
Called pt's home and spoke w/ wife.  Gave her pt;s lab results and instructed for pt to continue lab appts as scheduled.  She verbalized understanding.

## 2012-11-08 ENCOUNTER — Other Ambulatory Visit: Payer: Self-pay

## 2012-11-08 ENCOUNTER — Encounter (HOSPITAL_COMMUNITY): Payer: Self-pay | Admitting: Emergency Medicine

## 2012-11-08 ENCOUNTER — Emergency Department (HOSPITAL_COMMUNITY)
Admission: EM | Admit: 2012-11-08 | Discharge: 2012-11-08 | Disposition: A | Discharge status: Home / Self Care | Payer: Medicare Other | Attending: Emergency Medicine | Admitting: Emergency Medicine

## 2012-11-08 DIAGNOSIS — M069 Rheumatoid arthritis, unspecified: Secondary | ICD-10-CM | POA: Insufficient documentation

## 2012-11-08 DIAGNOSIS — D61818 Other pancytopenia: Secondary | ICD-10-CM | POA: Insufficient documentation

## 2012-11-08 DIAGNOSIS — R002 Palpitations: Secondary | ICD-10-CM | POA: Insufficient documentation

## 2012-11-08 DIAGNOSIS — Z8249 Family history of ischemic heart disease and other diseases of the circulatory system: Secondary | ICD-10-CM | POA: Insufficient documentation

## 2012-11-08 DIAGNOSIS — R42 Dizziness and giddiness: Secondary | ICD-10-CM | POA: Insufficient documentation

## 2012-11-08 DIAGNOSIS — D649 Anemia, unspecified: Secondary | ICD-10-CM | POA: Insufficient documentation

## 2012-11-08 DIAGNOSIS — Z79899 Other long term (current) drug therapy: Secondary | ICD-10-CM | POA: Insufficient documentation

## 2012-11-08 DIAGNOSIS — I498 Other specified cardiac arrhythmias: Secondary | ICD-10-CM | POA: Insufficient documentation

## 2012-11-08 DIAGNOSIS — I471 Supraventricular tachycardia: Secondary | ICD-10-CM

## 2012-11-08 DIAGNOSIS — I10 Essential (primary) hypertension: Secondary | ICD-10-CM | POA: Insufficient documentation

## 2012-11-08 HISTORY — DX: Anemia, unspecified: D64.9

## 2012-11-08 HISTORY — DX: Essential (primary) hypertension: I10

## 2012-11-08 HISTORY — DX: Family history of ischemic heart disease and other diseases of the circulatory system: Z82.49

## 2012-11-08 LAB — CBC WITH DIFFERENTIAL/PLATELET
Basophils Absolute: 0 10*3/uL (ref 0.0–0.1)
Eosinophils Absolute: 0 10*3/uL (ref 0.0–0.7)
MCH: 25 pg — ABNORMAL LOW (ref 26.0–34.0)
MCHC: 32.8 g/dL (ref 30.0–36.0)
Monocytes Absolute: 0.3 10*3/uL (ref 0.1–1.0)
Neutrophils Relative %: 69 % (ref 43–77)
Platelets: 69 10*3/uL — ABNORMAL LOW (ref 150–400)
RDW: 26.6 % — ABNORMAL HIGH (ref 11.5–15.5)

## 2012-11-08 LAB — BASIC METABOLIC PANEL
Calcium: 9.2 mg/dL (ref 8.4–10.5)
GFR calc Af Amer: >90 mL/min (ref >90)
GFR calc non Af Amer: >90 mL/min (ref >90)
Glucose, Bld: 107 mg/dL — ABNORMAL HIGH (ref 70–99)
Sodium: 139 mEq/L (ref 135–145)

## 2012-11-08 LAB — MAGNESIUM: Magnesium: 1.8 mg/dL (ref 1.5–2.5)

## 2012-11-08 LAB — PHOSPHORUS: Phosphorus: 3.2 mg/dL (ref 2.3–4.6)

## 2012-11-08 MED ORDER — METOPROLOL TARTRATE 50 MG PO TABS: 25.0000 mg | ORAL_TABLET | Freq: Two times a day (BID) | ORAL | Status: DC

## 2012-11-08 NOTE — ED Provider Notes (Signed)
Complains of heart racing onset 9 AM today accompanied by lightheadedness symptoms intermittent he denies any chest pain denies shortness of breath denies other complaint symptoms resolved at approximately 2 PM he is presently asymptomatic patient alert Glasgow Coma Score 15 lungs clear auscultation heart regular rate and rhythm abdomen nontender extremities without edema  Doug Sou, MD 11/08/12 1810

## 2012-11-08 NOTE — ED Provider Notes (Signed)
History     CSN: [id number]  Arrival date & time 11/08/12  1550   First MD Initiated Contact with Patient 11/08/12 1656     Chief Complaint  Patient presents with  . Tachycardia   HPI: Mr. Turnbough is a 66 yo CM with history of MDS, RA and HTN who presents with tachycardia and dizziness. He was in his normal state of health until this morning at 9 am when he became dizzy. He describes this sensation as light-headedness but not vertigo. Symptoms were intermittent. He ran several errands today. At 2 pm his dizziness became more constant so he took his BP it was 118/62, but his pulse was 140. He continued to feel dizzy and his pulse remained elevated so he went to the fire department close to his house. He again was noted to be tachycardic to 120's, so he was brought in for evaluation. He does endorse chronic, baseline SOB since he was diagnosed with MDS. His SOB did worsen when his heart rate was elevated. His heart rate normalized spontaneously, and his symptoms resolved spontaneously. He is currently asymptomatic. He denies exertional CP or SOB, no diarrhea, vomiting, dark stools, leg swelling or abdominal pain.   Past Medical History  Diagnosis Date  . MDS (myelodysplastic syndrome), low grade 2012    on observation  . Rheumatoid arthritis     was on MTX until MDS dx  . Hypertension     History reviewed. No pertinent past surgical history.  History reviewed. No pertinent family history.  History  Substance Use Topics  . Smoking status: Never Smoker   . Smokeless tobacco: Not on file  . Alcohol Use: No   Review of Systems  Constitutional: Negative for fever and chills.  HENT: Negative for congestion and rhinorrhea.   Eyes: Negative for photophobia and visual disturbance.  Respiratory: Negative for cough and shortness of breath.   Cardiovascular: Positive for palpitations. Negative for chest pain.  Gastrointestinal: Negative for nausea, vomiting, abdominal pain and diarrhea.   Genitourinary: Negative for dysuria, urgency and decreased urine volume.  Musculoskeletal: Negative for myalgias, back pain and arthralgias.  Skin: Negative for pallor and rash.  Neurological: Positive for dizziness and light-headedness. Negative for syncope.  Psychiatric/Behavioral: Negative for confusion and agitation.  All other systems reviewed and are negative.   Allergies  Review of patient's allergies indicates no known allergies.  Home Medications   Current Outpatient Rx  Name  Route  Sig  Dispense  Refill  . ACETAMINOPHEN 325 MG PO TABS   Oral   Take 650 mg by mouth every 6 (six) hours as needed.         Marland Kitchen CALTRATE 600 PLUS-VIT D PO   Oral   Take 1 tablet by mouth 2 (two) times daily.          Marland Kitchen LISINOPRIL-HYDROCHLOROTHIAZIDE 20-25 MG PO TABS   Oral   Take 1 tablet by mouth Daily.         . OMEGA-3-ACID ETHYL ESTERS 1 G PO CAPS   Oral   Take 2 g by mouth 2 (two) times daily.         Marland Kitchen PREDNISONE 10 MG PO TABS   Oral   Take 5 mg by mouth daily.          . TRAMADOL HCL 50 MG PO TABS   Oral   Take 50 mg by mouth Every 6 hours as needed. For pain  BP 138/75  Pulse 93  Temp 98.2 F (36.8 C) (Oral)  Resp 16  SpO2 100%  Physical Exam  Nursing note and vitals reviewed. Constitutional: He is oriented to person, place, and time. He is cooperative. No distress.       Overweight gentleman, well groomed.   HENT:  Head: Normocephalic and atraumatic.  Mouth/Throat: Oropharynx is clear and moist.  Eyes: Conjunctivae normal and EOM are normal. Pupils are equal, round, and reactive to light.  Neck: Normal range of motion. Neck supple. No JVD present.  Cardiovascular: Normal rate and regular rhythm.   No murmur heard. Pulmonary/Chest: Effort normal and breath sounds normal. No respiratory distress.  Abdominal: Soft. Bowel sounds are normal. There is no tenderness. There is no guarding.  Musculoskeletal: Normal range of motion.  Neurological:  He is alert and oriented to person, place, and time.  Skin: Skin is warm and dry. No pallor.    ED Course  Procedures   Labs Reviewed  CBC WITH DIFFERENTIAL - Abnormal; Notable for the following:    RBC 3.60 (*)     Hemoglobin 9.0 (*)     HCT 27.4 (*)     MCV 76.1 (*)     MCH 25.0 (*)     RDW 26.6 (*)     Platelets 69 (*)  PLATELET COUNT CONFIRMED BY SMEAR   All other components within normal limits  BASIC METABOLIC PANEL - Abnormal; Notable for the following:    Glucose, Bld 107 (*)     All other components within normal limits  MAGNESIUM  PHOSPHORUS   No results found.  1. SVT (supraventricular tachycardia)   2. Anemia   3. Family history of ischemic heart disease    MDM  66 yo CM with history of MDS, RA and HTN who presents with tachycardia and dizziness. Afebrile, vital signs stable. Asymptomatic on my exam. ECG from EMS appears to be accelerated junctional. No acute ischemia. ECG in ED is normal, no WPW, prolonged QT, or Brugada. As symptoms have resolved, patient denies any symptoms currently, electrolytes all normal feel he can be managed further as an outpatient. Hgb stable as well. Discussed case with Dr. Eldridge Dace who evaluated the patient, he recommended starting metoprolol 25 mg BID, he will see in clinic. Patient in agreement with plan. He remained HD stable while in the ED. Return precautions to include recurrent episode, chest pain, trouble breathing, feeling faint or passing out were given.   Reviewed ECG, labs and previous medical records, utilized in MDM  Discussed case with Dr. Ethelda Chick  ECG: SR, rate 84, normal axis, normal intervals, Q wave in lead III, otherwise normal. No comparison.   Clinical Impression 1. SVT, resolved 2. Myelodysplastic syndrome 3. Chronic anemia      Margie Billet, MD 11/09/12 [phone]

## 2012-11-08 NOTE — Consult Note (Signed)
Admit date: 11/08/2012 Referring Physician  Dr. Ethelda Chick Primary Physician  Dr. Paulino Rily Primary Cardiologist  Tamana Hatfield-new Reason for Consultation  arrhythmia  HPI: 66 year old male  with dysplastic syndrome as usual state of health this morning.  He started to feel a little bit dizzy.  He was hesitant to get up and walk much.  He also felt somewhat tired.  Regardless, he was busy this morning with several activities including taking his wife to a doctor's appointment.  He checked his blood pressure at home and his blood pressure is in the normal range but his heart rate was around 140.  The dizzy feelings became worse over the course of the day.  He came to the emergency room via EMS.  He had a narrow complex tachycardia with a heart rate in the 130s on the EMS ECG.  There were no clear P waves.  The rhythm was regular.  There were no fibrillatory or flutter waves.  By the time he arrived in the emergency room, his heart rate was normal sinus rhythm in the 80s.  In fact, review of the rhythm strips from EMS showed a strip of sinus tachycardia, likely after the SVT resolved.  Currently he is feeling well.  He denied any chest pain or shortness of breath.  He does have a family history of heart disease which is significant but he has not had any ischemic symptoms in the past.   PMH:   Past Medical History  Diagnosis Date  . MDS (myelodysplastic syndrome), low grade 2012    on observation  . Rheumatoid arthritis     was on MTX until MDS dx  . Hypertension      PSH:  History reviewed. No pertinent past surgical history.  Allergies:  Review of patient's allergies indicates no known allergies. Prior to Admit Meds:   (Not in a hospital admission) Fam HX:   History reviewed. No pertinent family history. Social HX:    History   Social History  . Marital Status: Married    Spouse Name: N/A    Number of Children: N/A  . Years of Education: N/A   Occupational History  . Not on file.    Social History Main Topics  . Smoking status: Never Smoker   . Smokeless tobacco: Not on file  . Alcohol Use: No  . Drug Use: No  . Sexually Active:    Other Topics Concern  . Not on file   Social History Narrative  . No narrative on file     ROS:  All 11 ROS were addressed and are negative except what is stated in the HPI  Physical Exam: Blood pressure 138/75, pulse 93, temperature 98.2 F (36.8 C), temperature source Oral, resp. rate 16, SpO2 100.00%.    General: Well developed, well nourished, in no acute distress Head: No xanthomas.   Normal cephalic and atramatic  Lungs:   Clear bilaterally to auscultation and percussion. Heart: HRRR S1 S2  Abdomen: Bowel sounds are positive, abdomen soft and non-tender without masses or                  Hernia's noted. Msk:  Normal strength and tone for age. Extremities:   No edema.  DP  2+  Neuro: Alert and oriented X 3. Psych:  Normal  affect, responds appropriately    Labs:   Lab Results  Component Value Date   WBC 5.6 11/08/2012   HGB 9.0* 11/08/2012   HCT 27.4* 11/08/2012  MCV 76.1* 11/08/2012   PLT 69* 11/08/2012    Lab 11/08/12 1722  NA 139  K 4.4  CL 103  CO2 27  BUN 21  CREATININE 0.75  CALCIUM 9.2  PROT --  BILITOT --  ALKPHOS --  ALT --  AST --  GLUCOSE 107*   No results found for this basename: PTT   No results found for this basename: INR, PROTIME   No results found for this basename: CKTOTAL, CKMB, CKMBINDEX, TROPONINI     No results found for this basename: CHOL   No results found for this basename: HDL   No results found for this basename: LDLCALC   No results found for this basename: TRIG   No results found for this basename: CHOLHDL   No results found for this basename: LDLDIRECT      Radiology:  No results found.  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  Would start treatment with metoprolol 25 mg by mouth twice a day.  I discussed this with the patient.  He is hesitant to start a new medicine.  He would like to take the metoprolol as needed.  If his symptoms become more frequent, he will take the medicine more regularly.  I asked him to avoid caffeine and alcohol as well.  He already uses only minimal caffeine and alcohol.  Will add on thyroid testing.  Chronic anemia for myelodysplastic syndrome.  This may exacerbate his tachycardia.  Several relatives including his brother and father have had heart disease.  He needs to try to lose weight and keep his lipids under control.  We will schedule outpatient followup for him including an echocardiogram.  Corky Crafts., MD  11/08/2012  6:47 PM

## 2012-11-08 NOTE — ED Notes (Signed)
Per [facility] pt states he was up doing work around the house when he began having dizziness, and his heart began to race so he went to the fire dept to be checked out. Medic states he was in tripod position, HR was in the 140's and he had labored breathing. Pt stated he felt like he was going to pass out. Dizziness subsided 30 mins pta, HR was 95-99 , 100% RA, gave him 4L via canula because the pt felt like he was short of breath and he felt better. Pt has a hx of hypertension, and a blood disorder called MDS, which he gets his blood checked at [facility] once a month. Takes Lisinipril for b/p, prednisone for MDS, fish oil, and calcium. Received 250cc NS prior to arrival.

## 2012-11-08 NOTE — ED Notes (Signed)
MD at bedside. 

## 2012-11-09 NOTE — ED Provider Notes (Signed)
I have personally seen and examined the patient.  I have discussed the plan of care with the resident.  I have reviewed the documentation on PMH/FH/Soc. History.  I have reviewed the documentation of the resident and agree.  Doug Sou, MD 11/09/12 [phone]

## 2012-12-04 ENCOUNTER — Other Ambulatory Visit (HOSPITAL_BASED_OUTPATIENT_CLINIC_OR_DEPARTMENT_OTHER): Payer: Medicare Other

## 2012-12-04 ENCOUNTER — Telehealth: Payer: Self-pay | Admitting: *Deleted

## 2012-12-04 DIAGNOSIS — D4620 Refractory anemia with excess of blasts, unspecified: Secondary | ICD-10-CM

## 2012-12-04 DIAGNOSIS — D46Z Other myelodysplastic syndromes: Secondary | ICD-10-CM

## 2012-12-04 LAB — CBC WITH DIFFERENTIAL/PLATELET
BASO%: 0.2 % (ref 0.0–2.0)
EOS%: 0 % (ref 0.0–7.0)
HCT: 27.9 % — ABNORMAL LOW (ref 38.4–49.9)
MCH: 24.5 pg — ABNORMAL LOW (ref 27.2–33.4)
MCHC: 32.3 g/dL (ref 32.0–36.0)
NEUT%: 60.9 % (ref 39.0–75.0)
lymph#: 1.9 10*3/uL (ref 0.9–3.3)

## 2012-12-04 NOTE — Telephone Encounter (Signed)
Message copied by Wende Mott on Mon Dec 04, 2012  5:04 PM ------      Message from: Clenton Pare R      Created: Mon Dec 04, 2012 10:47 AM       Cal pt. CBC remains stable. Recommend continued observation.

## 2012-12-04 NOTE — Telephone Encounter (Signed)
Spoke w/ pt in lobby this morning.  Gave him lab results and informed stable to keep lab appt next month as scheduled.  He verbalized understanding.

## 2012-12-18 ENCOUNTER — Encounter: Payer: Self-pay | Admitting: *Deleted

## 2012-12-18 ENCOUNTER — Other Ambulatory Visit: Payer: Self-pay | Admitting: Oncology

## 2012-12-18 NOTE — Progress Notes (Signed)
Faxed over letter from [name] for clearance for surgery,  Total knee replacement, to [facility] at fax [phone].  Also faxed most recent office note.

## 2012-12-20 ENCOUNTER — Telehealth: Payer: Self-pay | Admitting: *Deleted

## 2012-12-20 NOTE — Telephone Encounter (Signed)
Pt called to check on his clearance from [name] for his knee replacement surgery.  Informed pt this RN faxed over Dr. [name] letter for clearance to his orthopedic office on 1/13 and I just re faxed it this morning after getting pt's message.  He verbalized understanding and will check w/ his ortho.

## 2013-01-01 ENCOUNTER — Telehealth: Payer: Self-pay | Admitting: *Deleted

## 2013-01-01 ENCOUNTER — Other Ambulatory Visit (HOSPITAL_BASED_OUTPATIENT_CLINIC_OR_DEPARTMENT_OTHER): Payer: Medicare Other

## 2013-01-01 DIAGNOSIS — D4620 Refractory anemia with excess of blasts, unspecified: Secondary | ICD-10-CM

## 2013-01-01 LAB — CBC WITH DIFFERENTIAL/PLATELET
BASO%: 0.1 % (ref 0.0–2.0)
EOS%: 0 % (ref 0.0–7.0)
LYMPH%: 27.6 % (ref 14.0–49.0)
MCHC: 32.5 g/dL (ref 32.0–36.0)
MCV: 75.3 fL — ABNORMAL LOW (ref 79.3–98.0)
MONO#: 0.8 10*3/uL (ref 0.1–0.9)
MONO%: 11.8 % (ref 0.0–14.0)
Platelets: 73 10*3/uL — ABNORMAL LOW (ref 140–400)
RBC: 3.72 10*6/uL — ABNORMAL LOW (ref 4.20–5.82)
WBC: 7.1 10*3/uL (ref 4.0–10.3)
nRBC: 0 % (ref 0–0)

## 2013-01-01 NOTE — Telephone Encounter (Signed)
Message copied by Reesa Chew on Mon Jan 01, 2013  5:25 PM ------      Message from: Su Hilt C      Created: Mon Jan 01, 2013  5:08 PM                   ----- Message -----         From: Myrtis Ser, NP         Sent: 01/01/2013   9:30 AM           To: Marlowe Aschoff, RN            Please call pt. Anemia and thrombocytopenia is stable. Continue observation.

## 2013-01-01 NOTE — Telephone Encounter (Signed)
Spoke with patient, anemia and thrombocytopenia stable. Continue observation. Patient verbalized understanding.

## 2013-01-22 ENCOUNTER — Encounter (HOSPITAL_COMMUNITY): Payer: Self-pay | Admitting: Pharmacy Technician

## 2013-01-26 ENCOUNTER — Encounter (HOSPITAL_COMMUNITY)
Admission: RE | Admit: 2013-01-26 | Discharge: 2013-01-26 | Disposition: A | Discharge status: Home / Self Care | Payer: Medicare Other | Source: Ambulatory Visit | Attending: Orthopedic Surgery | Admitting: Orthopedic Surgery

## 2013-01-26 ENCOUNTER — Encounter (HOSPITAL_COMMUNITY): Payer: Self-pay

## 2013-01-26 ENCOUNTER — Other Ambulatory Visit: Payer: Self-pay | Admitting: Physician Assistant

## 2013-01-26 HISTORY — DX: Cardiac arrhythmia, unspecified: I49.9

## 2013-01-26 HISTORY — DX: Gout, unspecified: M10.9

## 2013-01-26 HISTORY — DX: Tachycardia, unspecified: R00.0

## 2013-01-26 HISTORY — DX: Shortness of breath: R06.02

## 2013-01-26 LAB — CBC WITH DIFFERENTIAL/PLATELET
Basophils Absolute: 0 10*3/uL (ref 0.0–0.1)
Eosinophils Relative: 0 % (ref 0–5)
MCH: 25 pg — ABNORMAL LOW (ref 26.0–34.0)
Monocytes Absolute: 0.3 10*3/uL (ref 0.1–1.0)
Neutrophils Relative %: 78 % — ABNORMAL HIGH (ref 43–77)
Platelets: 92 10*3/uL — ABNORMAL LOW (ref 150–400)
RBC: 3.52 MIL/uL — ABNORMAL LOW (ref 4.22–5.81)
RDW: 27 % — ABNORMAL HIGH (ref 11.5–15.5)
WBC: 7.4 10*3/uL (ref 4.0–10.5)

## 2013-01-26 LAB — COMPREHENSIVE METABOLIC PANEL
ALT: 17 U/L (ref 0–53)
AST: 17 U/L (ref 0–37)
Albumin: 3.8 g/dL (ref 3.5–5.2)
CO2: 27 mEq/L (ref 19–32)
Calcium: 9.7 mg/dL (ref 8.4–10.5)
Creatinine, Ser: 0.72 mg/dL (ref 0.50–1.35)
GFR calc non Af Amer: >90 mL/min (ref >90)
Sodium: 138 mEq/L (ref 135–145)
Total Protein: 8 g/dL (ref 6.0–8.3)

## 2013-01-26 LAB — URINALYSIS, ROUTINE W REFLEX MICROSCOPIC
Bilirubin Urine: NEGATIVE
Ketones, ur: NEGATIVE mg/dL
Leukocytes, UA: NEGATIVE
Nitrite: NEGATIVE
Protein, ur: NEGATIVE mg/dL
pH: 5 (ref 5.0–8.0)

## 2013-01-26 LAB — ABO/RH: ABO/RH(D): O POS

## 2013-01-26 LAB — PROTIME-INR: INR: 1.15 (ref 0.00–1.49)

## 2013-01-26 NOTE — Pre-Procedure Instructions (Signed)
CHRITOPHER COSTER  01/26/2013   Your procedure is scheduled on:  Friday, Feb. 28  Report to [facility] on 3rd floor at 0530 AM.  Call this number if you have problems the morning of surgery: [phone]   Remember:   Do not eat food or drink liquids after midnight.Thursday night   Take these medicines the morning of surgery with A SIP OF WATER: Metoprolol,Prednisone, Colchicine,Tramadol   Do not wear jewelry, make-up or nail polish.  Do not wear lotions, powders, or perfumes. You may not  wear deodorant.  Do not shave 48 hours prior to surgery. Men may shave face and neck.  Do not bring valuables to the hospital.  Contacts, dentures or bridgework may not be worn into surgery.  Leave suitcase in the car. After surgery it may be brought to your room.  For patients admitted to the hospital, checkout time is 11:00 AM the day of  discharge.   Special Instructions: Incentive Spirometry - Practice and bring it with you on the day of surgery. Shower using CHG 2 nights before surgery and the night before surgery.  If you shower the day of surgery use CHG.  Use special wash - you have one bottle of CHG for all showers.  You should use approximately 1/3 of the bottle for each shower.   Please read over the following fact sheets that you were given: Pain Booklet, Coughing and Deep Breathing, Blood Transfusion Information and Surgical Site Infection Prevention

## 2013-01-26 NOTE — Progress Notes (Signed)
Stop-Bang tool assessment positive for sleep apnea;Questionnaire faxed to PCP

## 2013-01-26 NOTE — Progress Notes (Signed)
Telephone call To [facility] @ Dr Candise Bowens office; HGB 8.8; letter noted in chart by Dr [name] GN:[id number]  And need for platelets pre op. Sherri will get message to Dr [name] and/or PA.

## 2013-01-27 LAB — URINE CULTURE: Colony Count: NO GROWTH

## 2013-01-29 ENCOUNTER — Other Ambulatory Visit: Payer: Medicare Other

## 2013-01-29 LAB — PREPARE PLATELET PHERESIS: Unit division: 0

## 2013-01-29 NOTE — Progress Notes (Signed)
Spoke with Eric [name] at Dr. Candise Bowens office about need for order for platelets pre-op.  She states that [place], [place] knows about this and will be putting orders in.

## 2013-01-29 NOTE — Progress Notes (Signed)
Anesthesia Chart Review:  Patient is a 67 year old male scheduled for a right TKA on 02/02/13 by Dr. [name].  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 OSA screening score was a 5.  PCP is Dr. Mila Palmer.  Hematologist is [name], who felt patient was okay to proceed with TKA from his standpoint.  Patient has not yet required transfusion or chemotherapy for his MDS.  [name] did recommend transfusion with one unit of platelets on the way to the OR to bring patient's PLT count closer to 100K.  (Dr. Candise Bowens office is aware and will be entering the order.)    He was recently established with Cardiologist Dr. Eldridge Dace in the ED following evaluation for PSVT on 11/08/12.  Out-patient follow-up with echo was recommended.  He has since been evaluated at [facility] by Cristopher Peru, NP (for Dr. Eldridge Dace) on 12/15/12 for hospital follow-up AVNRT PSVT follow-up.  He was not having any chest pain, palpitations, swelling, syncope, or PND at that time.  EKG on 11/08/12 showed NSR.  Echo on 11/15/12 [facility]) showed LVEF estimated at 60-65%, mild left atrial enlargement, aortic valve sclerosis but opens well, mild tricuspid regurgitation, mildly elevated estimated RVSP, impaired relaxation.  CXR on 06/28/12 showed no acute cardiopulmonary abnormality.  Preoperative labs noted.  H/H 8.8/26.5 (previously hgb  ~ 9.0 - 10 since 07/2012; last 9.1 on 01/01/13), PLT 92K (previously ~ 70 - 90K since 07/2012.  Overall his labs appear stable, but with a slight drop in his H/H.  Will change his T&S to a T&C for 2 units to have available if needed.  Dr. [name] or his staff will be writing order for PLTs.  Patient has had a recent evaluation including echo for AVNRT/PSVT and has been maintaining NSR on metoprolol.  At this point, no stress test has been ordered as he has been without chest pain.  He has been cleared by his hematologist.  He will be evaluated by his anesthesiologist on the day of surgery, but if no acute changes then would anticipate he could proceed as planned. He is scheduled to receive pre-operative platelets and may ultimately require PRBC as well.  Anesthesiologist Dr. Sandford Craze agrees with this plan.  Shonna Chock, PA-C 01/30/13  [phone]

## 2013-01-29 NOTE — Progress Notes (Signed)
Left message at Dr. Hoyle Barr office for copies of EKG, Echo and office notes to be faxed to [place].

## 2013-01-30 NOTE — H&P (Signed)
TOTAL KNEE ADMISSION H&P  Patient is being admitted for right total knee arthroplasty.  Subjective:  Chief Complaint:right knee pain.  HPI: Eric [name], 67 y.o. male, has a history of pain and functional disability in the right knee due to arthritis and has failed non-surgical conservative treatments for greater than 12 weeks to includeNSAID's and/or analgesics, corticosteriod injections, viscosupplementation injections and activity modification.  Onset of symptoms was gradual, starting years ago with gradually worsening course since that time. The patient noted no past surgery on the right knee(s).  Patient currently rates pain in the right knee(s) as moderate to severe with activity. Patient has night pain, worsening of pain with activity and weight bearing, pain that interferes with activities of daily living, crepitus and joint swelling.  Patient has evidence of periarticular osteophytes and joint space narrowing by imaging studies.There is no active infection.  Patient Active Problem List   Diagnosis Date Noted  . Anemia   . Family history of ischemic heart disease   . MDS (myelodysplastic syndrome), low grade   . Rheumatoid arthritis    Past Medical History  Diagnosis Date  . MDS (myelodysplastic syndrome), low grade 2012    on observation  . Rheumatoid arthritis     was on MTX until MDS dx  . Hypertension   . Anemia   . Family history of ischemic heart disease   . Shortness of breath     exertional  . Dysrhythmia   . Tachycardia 2013    evaluated by Dr Varanasi @ [facility]  . Gout     Past Surgical History  Procedure Laterality Date  . Knee arthroscopy Left   . Hernia repair    . Umbilical hernia repair    . Circumcision       (Not in a hospital admission) No Known Allergies  History  Substance Use Topics  . Smoking status: Former Smoker    Types: Cigarettes    Quit date: 01/26/1970  . Smokeless tobacco: Never Used  . Alcohol Use: No    No family history on  file.   Review of Systems  Constitutional: Negative.   HENT: Positive for hearing loss. Negative for ear pain, nosebleeds and sore throat.   Eyes: Negative.   Respiratory: Positive for shortness of breath. Negative for cough, hemoptysis, sputum production and wheezing.   Cardiovascular: Negative.   Gastrointestinal: Negative.   Genitourinary: Negative.   Musculoskeletal: Positive for joint pain. Negative for falls.  Skin: Negative.   Neurological: Negative for dizziness, tingling, sensory change, focal weakness, seizures, loss of consciousness and headaches.  Endo/Heme/Allergies: Bruises/bleeds easily.  Psychiatric/Behavioral: Negative for depression and suicidal ideas.    Objective:  Physical Exam  Constitutional: He is oriented to person, place, and time. He appears well-developed and well-nourished. No distress.  HENT:  Head: Normocephalic and atraumatic.  Nose: Nose normal.  Eyes: Conjunctivae and EOM are normal. Pupils are equal, round, and reactive to light.  Neck: Normal range of motion. Neck supple.  Cardiovascular: Normal rate, regular rhythm and intact distal pulses.   Murmur heard. Respiratory: Effort normal and breath sounds normal. No respiratory distress. He has no wheezes. He has no rales. He exhibits no tenderness.  GI: Soft. Bowel sounds are normal. He exhibits no distension. There is no tenderness.  Musculoskeletal:       Right knee: He exhibits abnormal alignment. Tenderness found.  RLE NVI, no calf tenderness, MJL tenderness knee, positive PF crepitus, stable ligamentous testing, varus deformity  Lymphadenopathy:      He has no cervical adenopathy.  Neurological: He is alert and oriented to person, place, and time. No cranial nerve deficit.  Skin: Skin is warm and dry. No rash noted. No erythema. No pallor.  Psychiatric: He has a normal mood and affect. His behavior is normal.    Vital signs in last 24 hours: @VSRANGES@  Labs:   Estimated body mass  index is 30.96 kg/(m^2) as calculated from the following:   Height as of 10/09/12: 5' 8" (1.727 m).   Weight as of an earlier encounter on 01/26/13: 92.352 kg (203 lb 9.6 oz).   Imaging Review Plain radiographs demonstrate severe degenerative joint disease of the right knee(s). The overall alignment issignificant varus. The bone quality appears to be good for age and reported activity level.  Assessment/Plan:  End stage arthritis, right knee   The patient history, physical examination, clinical judgment of the provider and imaging studies are consistent with end stage degenerative joint disease of the right knee(s) and total knee arthroplasty is deemed medically necessary. The treatment options including medical management, injection therapy arthroscopy and arthroplasty were discussed at length. The risks and benefits of total knee arthroplasty were presented and reviewed. The risks due to aseptic loosening, infection, stiffness, patella tracking problems, thromboembolic complications and other imponderables were discussed. The patient acknowledged the explanation, agreed to proceed with the plan and consent was signed. Patient is being admitted for inpatient treatment for surgery, pain control, PT, OT, prophylactic antibiotics, VTE prophylaxis, progressive ambulation and ADL's and discharge planning. The patient is planning to be discharged to skilled nursing facility [facility].  Cardiologist recommends continued use of beta blocker perioperatively.  Hematologist recommends transfusion of one unit platelets on way to OR.  Will discuss with ansthesia preop regarding possible stress dose with prednisone.  Use of antibiotic cement.  Likely benefit from hospitalist consult while inpatient for assistance in medical management.     

## 2013-01-31 ENCOUNTER — Telehealth: Payer: Self-pay | Admitting: Oncology

## 2013-01-31 ENCOUNTER — Other Ambulatory Visit (HOSPITAL_BASED_OUTPATIENT_CLINIC_OR_DEPARTMENT_OTHER): Payer: Medicare Other | Admitting: Lab

## 2013-01-31 ENCOUNTER — Encounter (HOSPITAL_COMMUNITY)
Admission: RE | Admit: 2013-01-31 | Discharge: 2013-01-31 | Disposition: A | Discharge status: Home / Self Care | Payer: Medicare Other | Source: Ambulatory Visit | Attending: Oncology | Admitting: Oncology

## 2013-01-31 DIAGNOSIS — D696 Thrombocytopenia, unspecified: Secondary | ICD-10-CM

## 2013-01-31 DIAGNOSIS — D4620 Refractory anemia with excess of blasts, unspecified: Secondary | ICD-10-CM

## 2013-01-31 LAB — CBC WITH DIFFERENTIAL/PLATELET
Eosinophils Absolute: 0 10*3/uL (ref 0.0–0.5)
HCT: 25.9 % — ABNORMAL LOW (ref 38.4–49.9)
LYMPH%: 39.4 % (ref 14.0–49.0)
MCV: 75.3 fL — ABNORMAL LOW (ref 79.3–98.0)
MONO#: 0.3 10*3/uL (ref 0.1–0.9)
MONO%: 7.2 % (ref 0.0–14.0)
NEUT#: 2.3 10*3/uL (ref 1.5–6.5)
NEUT%: 53.2 % (ref 39.0–75.0)
Platelets: 75 10*3/uL — ABNORMAL LOW (ref 140–400)
RBC: 3.44 10*6/uL — ABNORMAL LOW (ref 4.20–5.82)
WBC: 4.3 10*3/uL (ref 4.0–10.3)

## 2013-01-31 NOTE — Telephone Encounter (Signed)
s.w. pt and advised on adding a lab b4 tx tomorrow.Marland KitchenMarland KitchenMarland KitchenMarland Kitchenpt ok and aware

## 2013-02-01 ENCOUNTER — Encounter: Payer: Self-pay | Admitting: Oncology

## 2013-02-01 ENCOUNTER — Telehealth: Payer: Self-pay | Admitting: *Deleted

## 2013-02-01 ENCOUNTER — Other Ambulatory Visit: Payer: Medicare Other | Admitting: Lab

## 2013-02-01 ENCOUNTER — Other Ambulatory Visit: Payer: Self-pay | Admitting: Oncology

## 2013-02-01 ENCOUNTER — Ambulatory Visit (HOSPITAL_BASED_OUTPATIENT_CLINIC_OR_DEPARTMENT_OTHER): Payer: Medicare Other

## 2013-02-01 VITALS — BP 119/54 | HR 58 | Temp 98.1°F | Resp 18

## 2013-02-01 DIAGNOSIS — D4620 Refractory anemia with excess of blasts, unspecified: Secondary | ICD-10-CM

## 2013-02-01 LAB — ABO/RH: ABO/RH(D): O POS

## 2013-02-01 MED ORDER — SODIUM CHLORIDE 0.9 % IV SOLN
250.0000 mL | Freq: Once | INTRAVENOUS | Status: AC
  Administered 2013-02-01: 250 mL via INTRAVENOUS

## 2013-02-01 MED ORDER — DIPHENHYDRAMINE HCL 25 MG PO CAPS
25.0000 mg | ORAL_CAPSULE | Freq: Once | ORAL | Status: AC
  Administered 2013-02-01: 25 mg via ORAL

## 2013-02-01 MED ORDER — SODIUM CHLORIDE 0.9 % IJ SOLN
3.0000 mL | INTRAMUSCULAR | Status: DC | PRN
  Filled 2013-02-01: qty 10

## 2013-02-01 MED ORDER — CEFAZOLIN SODIUM-DEXTROSE 2-3 GM-% IV SOLR
2.0000 g | INTRAVENOUS | Status: AC
  Administered 2013-02-02: 2 g via INTRAVENOUS
  Filled 2013-02-01 (×2): qty 50

## 2013-02-01 MED ORDER — ACETAMINOPHEN 325 MG PO TABS
650.0000 mg | ORAL_TABLET | Freq: Once | ORAL | Status: AC
  Administered 2013-02-01: 650 mg via ORAL

## 2013-02-01 NOTE — Telephone Encounter (Signed)
Faxed letter from [name] to Dr. [name] regarding DVT prophylaxis.  Faxed to #[phone].  Left VM for Tresa Endo at office informing of letter and to call us back if any further questions.

## 2013-02-01 NOTE — Patient Instructions (Addendum)
Platelet Transfusion Information This is information about transfusions of platelets. Platelets are tiny cells made by the bone marrow and found in the blood. When a blood vessel is damaged platelets rush to the damaged area to help form a clot. This begins the healing process. When platelets get very low your blood may have trouble clotting. This may be from:  Illness.  Blood disorder.  Chemotherapy to treat cancer. Often lower platelet counts do not usually cause problems.  Platelets usually last for 7 to 10 days. If they are not used not used in an injury, they are broken down by the liver or spleen. Symptoms of low platelet count include:  Nosebleeds.  Bleeding gums.  Heavy periods.  Bruising and tiny blood spots in the skin.  Pin point spots of bleeding are called (petechiae).  Larger bruises (purpura).  Bleeding can be more serious if it happens in the brain or bowel. Platelet transfusions are often used to keep the platelet count at an acceptable level. Serious bleeding due to low platelets is uncommon. RISKS AND COMPLICATIONS Severe side effects from platelet transfusions are uncommon. Minor reactions may include:  Itching.  Rashes.  High temperature and shivering. Medications are available to stop transfusion reactions. Let your caregivers know if you develop any of the above problems.  If you are having platelet transfusions frequently they may get less effective. This is called becoming refractory to platelets. It is uncommon. This can happen from non-immune causes and immune causes. Non-immune causes include:  High temperatures.  Some medications.  An enlarged spleen. Immune causes happen when your body discovers the platelets are not your own and begin making antibodies against them. The antibodies kill the platelets quickly. Even with platelet transfusions you may still notice problems with bleeding or bruising. Let your caregivers know about this. Other things  can be done to help if this happens.  BEFORE THE PROCEDURE   Your doctors will check your platelet count regularly.  If the platelet count is too low it may be necessary to have a platelet transfusion.  This is more important before certain procedures with a risk of bleeding such as a spinal tap.  Platelet transfusion reduces the risk of bleeding during or after the procedure.  Except in emergencies, giving a transfusion requires a written consent. Before blood is taken from a donor, a complete history is taken to make sure the person has no history of previous diseases, nor engages in risky social behavior. Examples of this are intravenous drug use or sexual activity with multiple partners. This could lead to infected blood or blood products being used. This history is done even in spite of the extensive testing to make sure the blood is safe. All blood products transfused are tested to make sure it is a match for the person getting the blood. It is also checked for infections. Blood is the safest it has ever been. The risk of getting an infection is very low. PROCEDURE  The platelets are stored in small plastic bags which are kept at a low temperature.  Each bag is called a unit and sometimes two units are given. They are given through an intravenous line by drip infusion over about one half hour.  Usually blood is collected from multiple people to get enough to transfuse.  Sometimes, the platelets are collected from a single person. This is done using a special machine that separates the platelets from the blood. The machine is called an apheresis machine. Platelets collected   in this way are called apheresed platelets. Apheresed platelets reduce the risk of becoming sensitive to the platelets. This lowers the chances of having a transfusion reaction.  As it only takes a short time to give the platelets, this treatment can be given in an outpatients department. Platelets can also be given  before or after other treatments. SEEK IMMEDIATE MEDICAL CARE IF: Any of the following symptoms over the next 12 hours or several days:  Shaking chills.  Fever with a temperature greater than 102 F (38.9 C) develops.  Back pain or muscle pain.  People around you feel you are not acting correctly, or you are confused.  Blood in the urine or bowel movements or bleeding from any place in your body.  Shortness of breath, or difficulty breathing.  Dizziness.  Fainting.  You break out in a rash or develop hives.  You have a decrease in the amount of urine you are putting out, or the urine turns a dark color or changes to pink, red, or brown.  A severe headache or stiff neck.  Bruising more easily. Document Released: 09/19/2007 Document Revised: 02/14/2012 Document Reviewed: 09/19/2007 ExitCare Patient Information 2013 [employer].  

## 2013-02-01 NOTE — Telephone Encounter (Signed)
Dr. Candise Bowens office asking if [name] has recommendation on DVT prophylaxis post op?

## 2013-02-01 NOTE — Progress Notes (Signed)
Reviewed platelet transfusion procedure with patient and wife and s/s of reaction to report. Able to verbalize reason for platelets and symptoms to report.

## 2013-02-02 ENCOUNTER — Encounter (HOSPITAL_COMMUNITY): Payer: Self-pay | Admitting: *Deleted

## 2013-02-02 ENCOUNTER — Inpatient Hospital Stay (HOSPITAL_COMMUNITY)
Admission: RE | Admit: 2013-02-02 | Discharge: 2013-02-05 | DRG: 470 | Disposition: A | Discharge status: Home Health | Payer: Medicare Other | Source: Ambulatory Visit | Attending: Orthopedic Surgery | Admitting: Orthopedic Surgery

## 2013-02-02 ENCOUNTER — Encounter (HOSPITAL_COMMUNITY)
Admission: RE | Disposition: A | Discharge status: Home Health | Payer: Self-pay | Source: Ambulatory Visit | Attending: Orthopedic Surgery

## 2013-02-02 ENCOUNTER — Inpatient Hospital Stay (HOSPITAL_COMMUNITY): Payer: Medicare Other | Admitting: Certified Registered"

## 2013-02-02 ENCOUNTER — Encounter (HOSPITAL_COMMUNITY): Payer: Self-pay | Admitting: Vascular Surgery

## 2013-02-02 DIAGNOSIS — M069 Rheumatoid arthritis, unspecified: Secondary | ICD-10-CM | POA: Diagnosis present

## 2013-02-02 DIAGNOSIS — Y9210 Unspecified residential institution as the place of occurrence of the external cause: Secondary | ICD-10-CM | POA: Diagnosis not present

## 2013-02-02 DIAGNOSIS — IMO0002 Reserved for concepts with insufficient information to code with codable children: Secondary | ICD-10-CM

## 2013-02-02 DIAGNOSIS — Z7901 Long term (current) use of anticoagulants: Secondary | ICD-10-CM

## 2013-02-02 DIAGNOSIS — I10 Essential (primary) hypertension: Secondary | ICD-10-CM | POA: Diagnosis present

## 2013-02-02 DIAGNOSIS — K560 Paralytic ileus: Secondary | ICD-10-CM | POA: Diagnosis not present

## 2013-02-02 DIAGNOSIS — Z8249 Family history of ischemic heart disease and other diseases of the circulatory system: Secondary | ICD-10-CM

## 2013-02-02 DIAGNOSIS — T50995A Adverse effect of other drugs, medicaments and biological substances, initial encounter: Secondary | ICD-10-CM | POA: Diagnosis not present

## 2013-02-02 DIAGNOSIS — M109 Gout, unspecified: Secondary | ICD-10-CM | POA: Diagnosis present

## 2013-02-02 DIAGNOSIS — Z87891 Personal history of nicotine dependence: Secondary | ICD-10-CM

## 2013-02-02 DIAGNOSIS — Z79899 Other long term (current) drug therapy: Secondary | ICD-10-CM

## 2013-02-02 DIAGNOSIS — K5900 Constipation, unspecified: Secondary | ICD-10-CM

## 2013-02-02 DIAGNOSIS — D696 Thrombocytopenia, unspecified: Secondary | ICD-10-CM | POA: Diagnosis present

## 2013-02-02 DIAGNOSIS — D62 Acute posthemorrhagic anemia: Secondary | ICD-10-CM | POA: Diagnosis not present

## 2013-02-02 DIAGNOSIS — Z01812 Encounter for preprocedural laboratory examination: Secondary | ICD-10-CM

## 2013-02-02 DIAGNOSIS — D4620 Refractory anemia with excess of blasts, unspecified: Secondary | ICD-10-CM | POA: Diagnosis present

## 2013-02-02 DIAGNOSIS — M1710 Unilateral primary osteoarthritis, unspecified knee: Principal | ICD-10-CM | POA: Diagnosis present

## 2013-02-02 HISTORY — PX: TOTAL KNEE ARTHROPLASTY: SHX125

## 2013-02-02 LAB — CBC
Hemoglobin: 7.7 g/dL — ABNORMAL LOW (ref 13.0–17.0)
Hemoglobin: 7 g/dL — ABNORMAL LOW (ref 13.0–17.0)
MCH: 24.7 pg — ABNORMAL LOW (ref 26.0–34.0)
MCHC: 33.2 g/dL (ref 30.0–36.0)
MCHC: 33.2 g/dL (ref 30.0–36.0)
Platelets: 82 10*3/uL — ABNORMAL LOW (ref 150–400)
RDW: 26.7 % — ABNORMAL HIGH (ref 11.5–15.5)
RDW: 26.7 % — ABNORMAL HIGH (ref 11.5–15.5)

## 2013-02-02 LAB — BASIC METABOLIC PANEL
BUN: 21 mg/dL (ref 6–23)
Calcium: 9.4 mg/dL (ref 8.4–10.5)
GFR calc Af Amer: >90 mL/min (ref >90)
GFR calc non Af Amer: 90 mL/min — ABNORMAL LOW (ref >90)
GFR calc non Af Amer: 89 mL/min — ABNORMAL LOW (ref >90)
Glucose, Bld: 146 mg/dL — ABNORMAL HIGH (ref 70–99)
Potassium: 4.4 mEq/L (ref 3.5–5.1)
Sodium: 137 mEq/L (ref 135–145)

## 2013-02-02 LAB — PREPARE PLATELET PHERESIS: Unit division: 0

## 2013-02-02 SURGERY — ARTHROPLASTY, KNEE, TOTAL
Anesthesia: General | Site: Knee | Laterality: Right | Wound class: Clean

## 2013-02-02 MED ORDER — CHLORHEXIDINE GLUCONATE 4 % EX LIQD: 60.0000 mL | Freq: Once | CUTANEOUS | Status: DC

## 2013-02-02 MED ORDER — SENNOSIDES-DOCUSATE SODIUM 8.6-50 MG PO TABS
1.0000 | ORAL_TABLET | Freq: Every evening | ORAL | Status: DC | PRN
  Administered 2013-02-02: 1 via ORAL
  Filled 2013-02-02: qty 1

## 2013-02-02 MED ORDER — MIDAZOLAM HCL 5 MG/5ML IJ SOLN
INTRAMUSCULAR | Status: DC | PRN
  Administered 2013-02-02: 2 mg via INTRAVENOUS

## 2013-02-02 MED ORDER — LACTATED RINGERS IV SOLN
INTRAVENOUS | Status: DC | PRN
  Administered 2013-02-02 (×2): via INTRAVENOUS

## 2013-02-02 MED ORDER — DOCUSATE SODIUM 100 MG PO CAPS
100.0000 mg | ORAL_CAPSULE | Freq: Two times a day (BID) | ORAL | Status: DC
  Administered 2013-02-02 – 2013-02-05 (×6): 100 mg via ORAL
  Filled 2013-02-02 (×7): qty 1

## 2013-02-02 MED ORDER — OXYCODONE HCL 5 MG PO TABS
ORAL_TABLET | ORAL | Status: AC
  Filled 2013-02-02: qty 2

## 2013-02-02 MED ORDER — OXYCODONE HCL 5 MG PO TABS: 5.0000 mg | ORAL_TABLET | Freq: Once | ORAL | Status: DC | PRN

## 2013-02-02 MED ORDER — HYDROCORTISONE SOD SUCCINATE 100 MG IJ SOLR
INTRAMUSCULAR | Status: DC | PRN
Start: 2013-02-02 — End: 2013-02-02
  Administered 2013-02-02: 100 mg via INTRAVENOUS

## 2013-02-02 MED ORDER — ACETAMINOPHEN 10 MG/ML IV SOLN
INTRAVENOUS | Status: AC
  Filled 2013-02-02: qty 100

## 2013-02-02 MED ORDER — PROPOFOL 10 MG/ML IV BOLUS
INTRAVENOUS | Status: DC | PRN
  Administered 2013-02-02: 200 mg via INTRAVENOUS

## 2013-02-02 MED ORDER — LIDOCAINE HCL (CARDIAC) 20 MG/ML IV SOLN
INTRAVENOUS | Status: DC | PRN
  Administered 2013-02-02: 100 mg via INTRAVENOUS

## 2013-02-02 MED ORDER — LORATADINE 10 MG PO TABS
10.0000 mg | ORAL_TABLET | Freq: Every day | ORAL | Status: DC
  Administered 2013-02-03 – 2013-02-05 (×3): 10 mg via ORAL
  Filled 2013-02-02 (×3): qty 1

## 2013-02-02 MED ORDER — PHENOL 1.4 % MT LIQD: 1.0000 | OROMUCOSAL | Status: DC | PRN

## 2013-02-02 MED ORDER — DEXAMETHASONE SODIUM PHOSPHATE 4 MG/ML IJ SOLN: INTRAMUSCULAR | Status: DC | PRN

## 2013-02-02 MED ORDER — METOCLOPRAMIDE HCL 5 MG/ML IJ SOLN: 10.0000 mg | Freq: Once | INTRAMUSCULAR | Status: DC | PRN

## 2013-02-02 MED ORDER — SODIUM CHLORIDE 0.9 % IV SOLN: INTRAVENOUS | Status: DC

## 2013-02-02 MED ORDER — NEOSTIGMINE METHYLSULFATE 1 MG/ML IJ SOLN
INTRAMUSCULAR | Status: DC | PRN
  Administered 2013-02-02: 3 mg via INTRAVENOUS

## 2013-02-02 MED ORDER — ONDANSETRON HCL 4 MG PO TABS
4.0000 mg | ORAL_TABLET | Freq: Four times a day (QID) | ORAL | Status: DC | PRN
  Administered 2013-02-03: 4 mg via ORAL
  Filled 2013-02-02: qty 1

## 2013-02-02 MED ORDER — HYDROMORPHONE HCL PF 1 MG/ML IJ SOLN
1.0000 mg | INTRAMUSCULAR | Status: DC | PRN
  Administered 2013-02-02 – 2013-02-03 (×7): 1 mg via INTRAVENOUS
  Filled 2013-02-02 (×7): qty 1

## 2013-02-02 MED ORDER — SODIUM CHLORIDE 0.9 % IV SOLN
INTRAVENOUS | Status: DC
  Administered 2013-02-02 – 2013-02-03 (×2): via INTRAVENOUS

## 2013-02-02 MED ORDER — ACETAMINOPHEN 650 MG RE SUPP: 650.0000 mg | Freq: Four times a day (QID) | RECTAL | Status: DC | PRN

## 2013-02-02 MED ORDER — HYDROCORTISONE SOD SUCCINATE 100 MG IJ SOLR
100.0000 mg | Freq: Once | INTRAMUSCULAR | Status: DC
  Filled 2013-02-02: qty 2

## 2013-02-02 MED ORDER — BISACODYL 10 MG RE SUPP: 10.0000 mg | Freq: Every day | RECTAL | Status: DC | PRN

## 2013-02-02 MED ORDER — SODIUM CHLORIDE 0.9 % IR SOLN
Status: DC | PRN
  Administered 2013-02-02: 3000 mL

## 2013-02-02 MED ORDER — FLEET ENEMA 7-19 GM/118ML RE ENEM: 1.0000 | ENEMA | Freq: Once | RECTAL | Status: AC | PRN

## 2013-02-02 MED ORDER — LISINOPRIL 10 MG PO TABS
10.0000 mg | ORAL_TABLET | Freq: Every day | ORAL | Status: DC
  Administered 2013-02-03 – 2013-02-05 (×3): 10 mg via ORAL
  Filled 2013-02-02 (×3): qty 1

## 2013-02-02 MED ORDER — EPHEDRINE SULFATE 50 MG/ML IJ SOLN
INTRAMUSCULAR | Status: DC | PRN
  Administered 2013-02-02 (×2): 10 mg via INTRAVENOUS

## 2013-02-02 MED ORDER — HEPARIN SOD (PORK) LOCK FLUSH 100 UNIT/ML IV SOLN: 500.0000 [IU] | Freq: Every day | INTRAVENOUS | Status: DC | PRN

## 2013-02-02 MED ORDER — CEFAZOLIN SODIUM 1-5 GM-% IV SOLN
1.0000 g | Freq: Four times a day (QID) | INTRAVENOUS | Status: AC
  Administered 2013-02-02: 1 g via INTRAVENOUS
  Filled 2013-02-02 (×2): qty 50

## 2013-02-02 MED ORDER — BUPIVACAINE HCL (PF) 0.5 % IJ SOLN
INTRAMUSCULAR | Status: DC | PRN
  Administered 2013-02-02: 30 mL

## 2013-02-02 MED ORDER — ROCURONIUM BROMIDE 100 MG/10ML IV SOLN
INTRAVENOUS | Status: DC | PRN
  Administered 2013-02-02: 50 mg via INTRAVENOUS

## 2013-02-02 MED ORDER — OMEGA-3-ACID ETHYL ESTERS 1 G PO CAPS
2.0000 g | ORAL_CAPSULE | Freq: Two times a day (BID) | ORAL | Status: DC
  Administered 2013-02-04 – 2013-02-05 (×3): 2 g via ORAL
  Filled 2013-02-02 (×7): qty 2

## 2013-02-02 MED ORDER — GLYCOPYRROLATE 0.2 MG/ML IJ SOLN
INTRAMUSCULAR | Status: DC | PRN
  Administered 2013-02-02: 0.2 mg via INTRAVENOUS
  Administered 2013-02-02: 0.4 mg via INTRAVENOUS

## 2013-02-02 MED ORDER — ACETAMINOPHEN 10 MG/ML IV SOLN
1000.0000 mg | Freq: Four times a day (QID) | INTRAVENOUS | Status: DC
  Administered 2013-02-02: 1000 mg via INTRAVENOUS
  Filled 2013-02-02 (×3): qty 100

## 2013-02-02 MED ORDER — SUFENTANIL CITRATE 50 MCG/ML IV SOLN
INTRAVENOUS | Status: DC | PRN
  Administered 2013-02-02 (×4): 10 ug via INTRAVENOUS

## 2013-02-02 MED ORDER — METHOCARBAMOL 500 MG PO TABS
500.0000 mg | ORAL_TABLET | Freq: Four times a day (QID) | ORAL | Status: DC | PRN
  Administered 2013-02-02 – 2013-02-05 (×7): 500 mg via ORAL
  Filled 2013-02-02 (×6): qty 1

## 2013-02-02 MED ORDER — HYDROMORPHONE HCL PF 1 MG/ML IJ SOLN
INTRAMUSCULAR | Status: AC
  Filled 2013-02-02: qty 1

## 2013-02-02 MED ORDER — HEPARIN SOD (PORK) LOCK FLUSH 100 UNIT/ML IV SOLN: 250.0000 [IU] | INTRAVENOUS | Status: DC | PRN

## 2013-02-02 MED ORDER — METOPROLOL TARTRATE 25 MG PO TABS
25.0000 mg | ORAL_TABLET | Freq: Two times a day (BID) | ORAL | Status: DC
  Administered 2013-02-02 – 2013-02-05 (×6): 25 mg via ORAL
  Filled 2013-02-02 (×7): qty 1

## 2013-02-02 MED ORDER — ACETAMINOPHEN 325 MG PO TABS
650.0000 mg | ORAL_TABLET | Freq: Four times a day (QID) | ORAL | Status: DC | PRN
  Administered 2013-02-04 – 2013-02-05 (×5): 650 mg via ORAL
  Filled 2013-02-02 (×5): qty 2

## 2013-02-02 MED ORDER — MENTHOL 3 MG MT LOZG: 1.0000 | LOZENGE | OROMUCOSAL | Status: DC | PRN

## 2013-02-02 MED ORDER — PREDNISONE 5 MG PO TABS
5.0000 mg | ORAL_TABLET | Freq: Every day | ORAL | Status: DC
  Administered 2013-02-03 – 2013-02-05 (×3): 5 mg via ORAL
  Filled 2013-02-02 (×4): qty 1

## 2013-02-02 MED ORDER — OXYCODONE HCL 5 MG PO TABS
5.0000 mg | ORAL_TABLET | ORAL | Status: DC | PRN
  Administered 2013-02-02 – 2013-02-03 (×7): 10 mg via ORAL
  Filled 2013-02-02 (×7): qty 2

## 2013-02-02 MED ORDER — ONDANSETRON HCL 4 MG/2ML IJ SOLN
4.0000 mg | Freq: Four times a day (QID) | INTRAMUSCULAR | Status: DC | PRN
  Administered 2013-02-02 – 2013-02-04 (×3): 4 mg via INTRAVENOUS
  Filled 2013-02-02 (×3): qty 2

## 2013-02-02 MED ORDER — SODIUM CHLORIDE 0.9 % IJ SOLN: 10.0000 mL | INTRAMUSCULAR | Status: DC | PRN

## 2013-02-02 MED ORDER — HYDROCHLOROTHIAZIDE 12.5 MG PO CAPS
12.5000 mg | ORAL_CAPSULE | Freq: Every day | ORAL | Status: DC
  Administered 2013-02-03 – 2013-02-05 (×3): 12.5 mg via ORAL
  Filled 2013-02-02 (×3): qty 1

## 2013-02-02 MED ORDER — OXYCODONE HCL 5 MG/5ML PO SOLN: 5.0000 mg | Freq: Once | ORAL | Status: DC | PRN

## 2013-02-02 MED ORDER — ACETAMINOPHEN 10 MG/ML IV SOLN
1000.0000 mg | Freq: Four times a day (QID) | INTRAVENOUS | Status: AC
  Administered 2013-02-02 – 2013-02-03 (×4): 1000 mg via INTRAVENOUS
  Filled 2013-02-02 (×4): qty 100

## 2013-02-02 MED ORDER — LIDOCAINE HCL 4 % MT SOLN
OROMUCOSAL | Status: DC | PRN
  Administered 2013-02-02: 4 mL via TOPICAL

## 2013-02-02 MED ORDER — METHOCARBAMOL 500 MG PO TABS
ORAL_TABLET | ORAL | Status: AC
  Filled 2013-02-02: qty 1

## 2013-02-02 MED ORDER — ENOXAPARIN SODIUM 30 MG/0.3ML ~~LOC~~ SOLN
30.0000 mg | Freq: Two times a day (BID) | SUBCUTANEOUS | Status: DC
  Administered 2013-02-03: 30 mg via SUBCUTANEOUS
  Filled 2013-02-02 (×3): qty 0.3

## 2013-02-02 MED ORDER — ONDANSETRON HCL 4 MG/2ML IJ SOLN
INTRAMUSCULAR | Status: DC | PRN
  Administered 2013-02-02: 4 mg via INTRAVENOUS

## 2013-02-02 MED ORDER — HYDROMORPHONE HCL PF 1 MG/ML IJ SOLN
0.2500 mg | INTRAMUSCULAR | Status: DC | PRN
  Administered 2013-02-02 (×2): 0.5 mg via INTRAVENOUS

## 2013-02-02 MED ORDER — LISINOPRIL-HYDROCHLOROTHIAZIDE 20-25 MG PO TABS: 0.5000 | ORAL_TABLET | Freq: Every day | ORAL | Status: DC

## 2013-02-02 MED ORDER — 0.9 % SODIUM CHLORIDE (POUR BTL) OPTIME
TOPICAL | Status: DC | PRN
  Administered 2013-02-02: 1000 mL

## 2013-02-02 MED ORDER — METHOCARBAMOL 100 MG/ML IJ SOLN
500.0000 mg | Freq: Four times a day (QID) | INTRAVENOUS | Status: DC | PRN
  Filled 2013-02-02: qty 5

## 2013-02-02 SURGICAL SUPPLY — 61 items
BANDAGE ELASTIC 4 VELCRO ST LF (GAUZE/BANDAGES/DRESSINGS) ×2 IMPLANT
BANDAGE ELASTIC 6 VELCRO ST LF (GAUZE/BANDAGES/DRESSINGS) ×2 IMPLANT
BANDAGE ESMARK 6X9 LF (GAUZE/BANDAGES/DRESSINGS) ×1 IMPLANT
BLADE SAGITTAL 25.0X1.19X90 (BLADE) ×2 IMPLANT
BLADE SAW SAG 90X13X1.27 (BLADE) ×2 IMPLANT
BLADE SURG 10 STRL SS (BLADE) ×1 IMPLANT
BNDG CMPR 9X6 STRL LF SNTH (GAUZE/BANDAGES/DRESSINGS) ×1
BNDG ESMARK 6X9 LF (GAUZE/BANDAGES/DRESSINGS) ×2
BONE CEMENT GENTAMICIN (Cement) ×4 IMPLANT
BOWL SMART MIX CTS (DISPOSABLE) ×2 IMPLANT
CEMENT BONE GENTAMICIN 40 (Cement) IMPLANT
CLOTH BEACON ORANGE TIMEOUT ST (SAFETY) ×2 IMPLANT
COVER SURGICAL LIGHT HANDLE (MISCELLANEOUS) ×2 IMPLANT
CUFF TOURNIQUET SINGLE 34IN LL (TOURNIQUET CUFF) ×2 IMPLANT
CUFF TOURNIQUET SINGLE 44IN (TOURNIQUET CUFF) IMPLANT
DRAPE INCISE IOBAN 66X45 STRL (DRAPES) IMPLANT
DRAPE ORTHO SPLIT 77X108 STRL (DRAPES) ×4
DRAPE SURG ORHT 6 SPLT 77X108 (DRAPES) ×2 IMPLANT
DRAPE U-SHAPE 47X51 STRL (DRAPES) ×2 IMPLANT
DRSG ADAPTIC 3X8 NADH LF (GAUZE/BANDAGES/DRESSINGS) ×2 IMPLANT
DRSG PAD ABDOMINAL 8X10 ST (GAUZE/BANDAGES/DRESSINGS) ×3 IMPLANT
DURAPREP 26ML APPLICATOR (WOUND CARE) ×2 IMPLANT
ELECT REM PT RETURN 9FT ADLT (ELECTROSURGICAL) ×2
ELECTRODE REM PT RTRN 9FT ADLT (ELECTROSURGICAL) ×1 IMPLANT
EVACUATOR 1/8 PVC DRAIN (DRAIN) ×2 IMPLANT
FACESHIELD LNG OPTICON STERILE (SAFETY) ×4 IMPLANT
FLOSEAL 10ML (HEMOSTASIS) IMPLANT
GLOVE BIOGEL PI IND STRL 8 (GLOVE) ×4 IMPLANT
GLOVE BIOGEL PI INDICATOR 8 (GLOVE) ×4
GLOVE ORTHO TXT STRL SZ7.5 (GLOVE) ×6 IMPLANT
GLOVE SURG ORTHO 8.0 STRL STRW (GLOVE) ×6 IMPLANT
GOWN PREVENTION PLUS XLARGE (GOWN DISPOSABLE) ×2 IMPLANT
GOWN PREVENTION PLUS XXLARGE (GOWN DISPOSABLE) ×2 IMPLANT
GOWN STRL NON-REIN LRG LVL3 (GOWN DISPOSABLE) ×4 IMPLANT
HANDPIECE INTERPULSE COAX TIP (DISPOSABLE) ×2
HOOD PEEL AWAY FACE SHEILD DIS (HOOD) ×2 IMPLANT
IMMOBILIZER KNEE 22 UNIV (SOFTGOODS) ×1 IMPLANT
KIT BASIN OR (CUSTOM PROCEDURE TRAY) ×2 IMPLANT
KIT ROOM TURNOVER OR (KITS) ×2 IMPLANT
MANIFOLD NEPTUNE II (INSTRUMENTS) ×2 IMPLANT
NEEDLE 22X1 1/2 (OR ONLY) (NEEDLE) IMPLANT
NS IRRIG 1000ML POUR BTL (IV SOLUTION) ×2 IMPLANT
PACK TOTAL JOINT (CUSTOM PROCEDURE TRAY) ×2 IMPLANT
PAD ARMBOARD 7.5X6 YLW CONV (MISCELLANEOUS) ×4 IMPLANT
PAD CAST 4YDX4 CTTN HI CHSV (CAST SUPPLIES) ×1 IMPLANT
PADDING CAST COTTON 4X4 STRL (CAST SUPPLIES) ×2
PADDING CAST COTTON 6X4 STRL (CAST SUPPLIES) ×2 IMPLANT
SET HNDPC FAN SPRY TIP SCT (DISPOSABLE) ×1 IMPLANT
SPONGE GAUZE 4X4 12PLY (GAUZE/BANDAGES/DRESSINGS) ×2 IMPLANT
STAPLER VISISTAT 35W (STAPLE) ×2 IMPLANT
SUCTION FRAZIER TIP 10 FR DISP (SUCTIONS) ×2 IMPLANT
SUT ETHIBOND NAB CT1 #1 30IN (SUTURE) ×6 IMPLANT
SUT VIC AB 0 CT1 27 (SUTURE) ×2
SUT VIC AB 0 CT1 27XBRD ANBCTR (SUTURE) ×1 IMPLANT
SUT VIC AB 2-0 CT1 27 (SUTURE) ×4
SUT VIC AB 2-0 CT1 TAPERPNT 27 (SUTURE) ×2 IMPLANT
SYR CONTROL 10ML LL (SYRINGE) IMPLANT
TOWEL OR 17X24 6PK STRL BLUE (TOWEL DISPOSABLE) ×2 IMPLANT
TOWEL OR 17X26 10 PK STRL BLUE (TOWEL DISPOSABLE) ×2 IMPLANT
TRAY FOLEY CATH 14FR (SET/KITS/TRAYS/PACK) ×2 IMPLANT
WATER STERILE IRR 1000ML POUR (IV SOLUTION) ×4 IMPLANT

## 2013-02-02 NOTE — Brief Op Note (Signed)
02/02/2013  10:07 AM  PATIENT:  Eric [name]  67 y.o. male  PRE-OPERATIVE DIAGNOSIS:  OA RIGHT KNEE  POST-OPERATIVE DIAGNOSIS:  OA RIGHT KNEE  PROCEDURE:  Procedure(s) with comments: TOTAL KNEE ARTHROPLASTY (Right) - RIGHT ARTHROPLASTY KNEE MEDIAL/LATERAL COMPARTMENTS WITH PATELLA RESURFACING ANESTHESIA: GENERAL, PRE/POST OP FEMORAL NERVE  SURGEON:  Surgeon(s) and Role:    * W D Carloyn Manner., MD - Primary  PHYSICIAN ASSISTANT:   ASSISTANTS: Margart Sickles, PA-C   ANESTHESIA:   regional and general  EBL:  Total I/O In: 1000 [I.V.:1000] Out: 100 [Urine:100]  BLOOD ADMINISTERED:none  DRAINS: hemovac right knee self suction  LOCAL MEDICATIONS USED:  NONE  SPECIMEN:  No Specimen  DISPOSITION OF SPECIMEN:  N/A  COUNTS:  YES  TOURNIQUET:   Total Tourniquet Time Documented: Thigh (Right) - 81 minutes Total: Thigh (Right) - 81 minutes   DICTATION: .Other Dictation: Dictation Number unknown  PLAN OF CARE: Admit to inpatient   PATIENT DISPOSITION:  PACU - hemodynamically stable.   Delay start of Pharmacological VTE agent (>24hrs) due to surgical blood loss or risk of bleeding: yes

## 2013-02-02 NOTE — H&P (View-Only) (Signed)
TOTAL KNEE ADMISSION H&P  Patient is being admitted for right total knee arthroplasty.  Subjective:  Chief Complaint:right knee pain.  HPI: Eric [name], 67 y.o. male, has a history of pain and functional disability in the right knee due to arthritis and has failed non-surgical conservative treatments for greater than 12 weeks to includeNSAID's and/or analgesics, corticosteriod injections, viscosupplementation injections and activity modification.  Onset of symptoms was gradual, starting years ago with gradually worsening course since that time. The patient noted no past surgery on the right knee(s).  Patient currently rates pain in the right knee(s) as moderate to severe with activity. Patient has night pain, worsening of pain with activity and weight bearing, pain that interferes with activities of daily living, crepitus and joint swelling.  Patient has evidence of periarticular osteophytes and joint space narrowing by imaging studies.There is no active infection.  Patient Active Problem List   Diagnosis Date Noted  . Anemia   . Family history of ischemic heart disease   . MDS (myelodysplastic syndrome), low grade   . Rheumatoid arthritis    Past Medical History  Diagnosis Date  . MDS (myelodysplastic syndrome), low grade 2012    on observation  . Rheumatoid arthritis     was on MTX until MDS dx  . Hypertension   . Anemia   . Family history of ischemic heart disease   . Shortness of breath     exertional  . Dysrhythmia   . Tachycardia 2013    evaluated by Dr Eldridge Dace @ [place]  . Gout     Past Surgical History  Procedure Laterality Date  . Knee arthroscopy Left   . Hernia repair    . Umbilical hernia repair    . Circumcision       (Not in a hospital admission) No Known Allergies  History  Substance Use Topics  . Smoking status: Former Smoker    Types: Cigarettes    Quit date: 01/26/1970  . Smokeless tobacco: Never Used  . Alcohol Use: No    No family history on  file.   Review of Systems  Constitutional: Negative.   HENT: Positive for hearing loss. Negative for ear pain, nosebleeds and sore throat.   Eyes: Negative.   Respiratory: Positive for shortness of breath. Negative for cough, hemoptysis, sputum production and wheezing.   Cardiovascular: Negative.   Gastrointestinal: Negative.   Genitourinary: Negative.   Musculoskeletal: Positive for joint pain. Negative for falls.  Skin: Negative.   Neurological: Negative for dizziness, tingling, sensory change, focal weakness, seizures, loss of consciousness and headaches.  Endo/Heme/Allergies: Bruises/bleeds easily.  Psychiatric/Behavioral: Negative for depression and suicidal ideas.    Objective:  Physical Exam  Constitutional: He is oriented to person, place, and time. He appears well-developed and well-nourished. No distress.  HENT:  Head: Normocephalic and atraumatic.  Nose: Nose normal.  Eyes: Conjunctivae and EOM are normal. Pupils are equal, round, and reactive to light.  Neck: Normal range of motion. Neck supple.  Cardiovascular: Normal rate, regular rhythm and intact distal pulses.   Murmur heard. Respiratory: Effort normal and breath sounds normal. No respiratory distress. He has no wheezes. He has no rales. He exhibits no tenderness.  GI: Soft. Bowel sounds are normal. He exhibits no distension. There is no tenderness.  Musculoskeletal:       Right knee: He exhibits abnormal alignment. Tenderness found.  RLE NVI, no calf tenderness, MJL tenderness knee, positive PF crepitus, stable ligamentous testing, varus deformity  Lymphadenopathy:  He has no cervical adenopathy.  Neurological: He is alert and oriented to person, place, and time. No cranial nerve deficit.  Skin: Skin is warm and dry. No rash noted. No erythema. No pallor.  Psychiatric: He has a normal mood and affect. His behavior is normal.    Vital signs in last 24 hours: @VSRANGES @  Labs:   Estimated body mass  index is 30.96 kg/(m^2) as calculated from the following:   Height as of 10/09/12: 5\' 8"  (1.727 m).   Weight as of an earlier encounter on 01/26/13: 92.352 kg (203 lb 9.6 oz).   Imaging Review Plain radiographs demonstrate severe degenerative joint disease of the right knee(s). The overall alignment issignificant varus. The bone quality appears to be good for age and reported activity level.  Assessment/Plan:  End stage arthritis, right knee   The patient history, physical examination, clinical judgment of the provider and imaging studies are consistent with end stage degenerative joint disease of the right knee(s) and total knee arthroplasty is deemed medically necessary. The treatment options including medical management, injection therapy arthroscopy and arthroplasty were discussed at length. The risks and benefits of total knee arthroplasty were presented and reviewed. The risks due to aseptic loosening, infection, stiffness, patella tracking problems, thromboembolic complications and other imponderables were discussed. The patient acknowledged the explanation, agreed to proceed with the plan and consent was signed. Patient is being admitted for inpatient treatment for surgery, pain control, PT, OT, prophylactic antibiotics, VTE prophylaxis, progressive ambulation and ADL's and discharge planning. The patient is planning to be discharged to skilled nursing facility [facility].  Cardiologist recommends continued use of beta blocker perioperatively.  Hematologist recommends transfusion of one unit platelets on way to OR.  Will discuss with ansthesia preop regarding possible stress dose with prednisone.  Use of antibiotic cement.  Likely benefit from hospitalist consult while inpatient for assistance in medical management.

## 2013-02-02 NOTE — Plan of Care (Signed)
Problem: Phase I Progression Outcomes Goal: Dangle or out of bed evening of surgery Outcome: Completed/Met Date Met:  02/02/13 Pt ambulated 50 feet with PT.

## 2013-02-02 NOTE — Transfer of Care (Signed)
Immediate Anesthesia Transfer of Care Note  Patient: Eric [name]  Procedure(s) Performed: Procedure(s) with comments: TOTAL KNEE ARTHROPLASTY (Right) - RIGHT ARTHROPLASTY KNEE MEDIAL/LATERAL COMPARTMENTS WITH PATELLA RESURFACING ANESTHESIA: GENERAL, PRE/POST OP FEMORAL NERVE  Patient Location: PACU  Anesthesia Type:GA combined with regional for post-op pain  Level of Consciousness: oriented, sedated, patient cooperative and responds to stimulation  Airway & Oxygen Therapy: Patient Spontanous Breathing and Patient connected to nasal cannula oxygen  Post-op Assessment: Post -op Vital signs reviewed and stable and Patient moving all extremities X 4  Post vital signs: Reviewed and stable  Complications: No apparent anesthesia complications

## 2013-02-02 NOTE — Clinical Social Work Placement (Addendum)
Clinical Social Work Department  CLINICAL SOCIAL WORK PLACEMENT NOTE  02/02/2013  Patient: DAXSON REFFETT Account Number: [id number] Admit date:02/02/2013 Clinical Social Worker: Sabino Niemann, MSW Date/time: 02/02/2013 11:30 AM  Clinical Social Work is seeking post-discharge placement for this patient at the following level of care: SKILLED NURSING (*CSW will update this form in Epic as items are completed)  02/02/2013 Patient/family provided with [facility] Department of Clinical Social Work's list of facilities offering this level of care within the geographic area requested by the patient (or if unable, by the patient's family).  02/02/2013 Patient/family informed of their freedom to choose among providers that offer the needed level of care, that participate in Medicare, Medicaid or managed care program needed by the patient, have an available bed and are willing to accept the patient.  02/02/2013 Patient/family informed of [facility]' ownership interest in [facility], as well as of the fact that they are under no obligation to receive care at this facility.  PASARR submitted to EDS on PASARR number received from EDS on  FL2 transmitted to all facilities in geographic area requested by pt/family on 02/02/2013  FL2 transmitted to all facilities within larger geographic area on  Patient informed that his/her managed care company has contracts with or will negotiate with certain facilities, including the following:  Patient/family informed of bed offers received: 02/05/2013 Patient chooses bed at [facility] Physician recommends and patient chooses bed at  Patient to be transferred to on 02/05/2013 Patient to be transferred to facility by private vehicle  The following physician request were entered in Epic:  Additional Comments:  Sabino Niemann, MSW, [phone] Signing Off

## 2013-02-02 NOTE — Progress Notes (Signed)
Orthopedic Tech Progress Note Patient Details:  Eric [name] 11-Jan-1946 [id number]  CPM Right Knee CPM Right Knee: On Right Knee Flexion (Degrees): 60 Right Knee Extension (Degrees): 0 Additional Comments: TRAPEZE BAR   Shawnie Pons 02/02/2013, 12:04 PM

## 2013-02-02 NOTE — Interval H&P Note (Signed)
History and Physical Interval Note:  02/02/2013 7:29 AM  Eric [name]  has presented today for surgery, with the diagnosis of OA RIGHT KNEE  The various methods of treatment have been discussed with the patient and family. After consideration of risks, benefits and other options for treatment, the patient has consented to  Procedure(s) with comments: TOTAL KNEE ARTHROPLASTY (Right) - RIGHT ARTHROPLASTY KNEE MEDIAL/LATERAL COMPARTMENTS WITH PATELLA RESURFACING ANESTHESIA: GENERAL, PRE/POST OP FEMORAL NERVE as a surgical intervention .  The patient's history has been reviewed, patient examined, no change in status, stable for surgery.  I have reviewed the patient's chart and labs.  Questions were answered to the patient's satisfaction.     Abigale Dorow JR,W D

## 2013-02-02 NOTE — Consult Note (Signed)
[facility] Medical Consultation  Eric [name] WNU:[id number] DOB: [birth year] DOA: 02/02/2013 PCP: Eric Reeve, MD   Requesting physician: Dr. [name] (orthopedic surgery) Date of consultation: 02/02/2013 Reason for consultation: MDS management  Impression/Recommendations Active Problems:   MDS (myelodysplastic syndrome), low grade   Rheumatoid arthritis   Anemia   Thrombocytopenia, unspecified   HPI:  67 -year-old male with past medical history of MDS (under Dr. Lodema [name] care from oncology), rheumatoid arthritis, osteoarthritis who presented 02/02/2013 with severe functional disability secondary to right knee arthritis. Patient is status post right knee arthroplasty 02/02/2013. Orthopedic surgery requested medicine consult for management of medical comorbidities including MDS. Patient has no reports of chest pain, shortness of breath, palpitations. No reports of abdominal pain, nausea or vomiting. No lightheadedness or dizziness. No subsequent complications status post procedure.  Assessment and Plan:  Principal Problem:   *Osteoarthritis of right knee  Status post right knee arthroplasty, done 02/02/2013. No subsequent complications.  Management per orthopedic surgery. Active Problems:   MDS (myelodysplastic syndrome), low grade  Spoke with Dr. Gaylyn [name] of oncology. Patient is under watchful observation at this time. He has recommended we can use on Lovenox for DVT prophylaxis as patient is high risk of thrombosis.  Transfuse platelets only if patient is bleeding. Patient did receive one unit of platelets preoperatively.  Followup platelet count today  Hypertension  Blood pressure 146/60  Restart home medications: prinzide and metoprolol  Rheumatoid arthritis  Patient received one dose of Solu-Cortef 100 mg IV.    Anemia of chronic disease  Secondary to MDS, rheumatoid arthritis  Followup CBC to   Thrombocytopenia, unspecified  Followup CBC today  Transfuse  platelets only if patient is bleeding  DVT prophylaxis: per oncology Dr, Eric [name], recommendation is for Lovenox sub Q  Manson Passey [facility] Pager [phone]  Will follow up again tomorrow.  If 7PM-7AM, please contact night-coverage [email or web address] Password [facility] 02/02/2013, 10:26 AM  Review of Systems:  Constitutional: Negative for fever, chills, diaphoresis, activity change, appetite change and fatigue.  HENT: Negative for ear pain, nosebleeds, congestion, facial swelling, rhinorrhea, neck pain, neck stiffness and ear discharge.   Eyes: Negative for pain, discharge, redness, itching and visual disturbance.  Respiratory: Negative for cough, choking, chest tightness, shortness of breath, wheezing and stridor.   Cardiovascular: Negative for chest pain, palpitations and leg swelling.  Gastrointestinal: Negative for abdominal distention.  Genitourinary: Negative for dysuria, urgency, frequency, hematuria, flank pain, decreased urine volume, difficulty urinating and dyspareunia.  Musculoskeletal: right knee pain Neurological: Negative for dizziness, tremors, seizures, syncope, facial asymmetry, speech difficulty, weakness, light-headedness, numbness and headaches.  Hematological: Negative for adenopathy. Does not bruise/bleed easily.  Psychiatric/Behavioral: Negative for hallucinations, behavioral problems, confusion, dysphoric mood, decreased concentration and agitation.     Past Medical History  Diagnosis Date  . MDS (myelodysplastic syndrome), low grade 2012    on observation  . Rheumatoid arthritis     was on MTX until MDS dx  . Hypertension   . Anemia   . Family history of ischemic heart disease   . Shortness of breath     exertional  . Dysrhythmia   . Tachycardia 2013    evaluated by Dr Eldridge Dace @ [place]  . Gout    Past Surgical History  Procedure Laterality Date  . Knee arthroscopy Left   . Hernia repair    . Umbilical hernia repair    . Circumcision     Social  History:  reports that he quit smoking about 43 years  ago. His smoking use included Cigarettes. He smoked 0.00 packs per day. He has never used smokeless tobacco. He reports that he does not drink alcohol or use illicit drugs.  No Known Allergies  Family history: heart disease in parents  Prior to Admission medications   Medication Sig Start Date End Date Taking? Authorizing Provider  acetaminophen (TYLENOL) 500 MG tablet Take 1,000 mg by mouth every 6 (six) hours as needed for pain.   Yes Historical Provider, MD  Calcium-Vitamin D (CALTRATE 600 PLUS-VIT D PO) Take 1 tablet by mouth 2 (two) times daily.    Yes Historical Provider, MD  lisinopril-hydrochlorothiazide (PRINZIDE,ZESTORETIC) 20-25 MG per tablet Take 0.5 tablets by mouth Daily.  09/20/12  Yes Historical Provider, MD  loratadine (CLARITIN) 10 MG tablet Take 10 mg by mouth daily.   Yes Historical Provider, MD  metoprolol (LOPRESSOR) 50 MG tablet Take 0.5 tablets (25 mg total) by mouth 2 (two) times daily. 11/08/12  Yes Margie Billet, MD  omega-3 acid ethyl esters (LOVAZA) 1 G capsule Take 2 g by mouth 2 (two) times daily.   Yes Historical Provider, MD  predniSONE (DELTASONE) 10 MG tablet Take 5 mg by mouth daily.    Yes Historical Provider, MD   Physical Exam: Blood pressure 144/64, pulse 71, temperature 97.9 F (36.6 C), temperature source Oral, resp. rate 14, SpO2 99.00%. Filed Vitals:   02/02/13 0550 02/02/13 1015  BP: 116/76 144/64  Pulse: 55 71  Temp: 98 F (36.7 C) 97.9 F (36.6 C)  TempSrc: Oral   Resp: 20 14  SpO2: 97% 99%     General:  No acute distress, awake, oriented x 3  Eyes: PERRLA, EOMI  ENT: no tonsillar erythema or exudates  Neck: supple, no lymphadenopathy  Cardiovascular: regular rate and rhythm, (+) S1, S2  Respiratory: bilateral air entry, clear to auscultation  Abdomen: non tender/nondistended, (+) BS  Skin: warm, dry  Musculoskeletal: right knee pain, no edema  Psychiatric: normal  mood, affect  Neurologic: alert, no focal neurologic deficits  CBC:  Recent Labs Lab 01/31/13 0919  WBC 4.3  NEUTROABS 2.3  HGB 8.4*  HCT 25.9*  MCV 75.3*  PLT 75*    Radiological Exams on Admission: No results found.   Time spent: 75 minutes

## 2013-02-02 NOTE — Progress Notes (Signed)
   CARE MANAGEMENT NOTE 02/02/2013  Patient:  Eric [name], Eric [name]   Account Number:  [id number]  Date Initiated:  02/02/2013  Documentation initiated by:  [facility]  Subjective/Objective Assessment:   TOTAL KNEE ARTHROPLASTY     Action/Plan:   Anticipated DC Date:  01/29/2013   Anticipated DC Plan:  SKILLED NURSING FACILITY      DC Planning Services  CM consult      Choice offered to / List presented to:             [facility] agency  [facility]   Status of service:  In process, will continue to follow Medicare Important Message given?   (If response is "NO", the following Medicare IM given date fields will be blank) Date Medicare IM given:   Date Additional Medicare IM given:    Discharge Disposition:    Per UR Regulation:    If discussed at Long Length of Stay Meetings, dates discussed:    Comments:  02/02/2013 1500 NCM spoke to pt and states he may go to SNF post dc. Wants to see how well he improves post surgery. [facility] aware of pt's possible dc SNF. Will need HH order with [facility] if dc home with HH. Isidoro Donning RN CCM Case Mgmt phone [phone]

## 2013-02-02 NOTE — Progress Notes (Signed)
UR COMPLETED  

## 2013-02-02 NOTE — Progress Notes (Signed)
Will await PT/OT evaluation for disposition determination.   Sabino Niemann, MSW [phone]

## 2013-02-02 NOTE — Anesthesia Preprocedure Evaluation (Addendum)
Anesthesia Evaluation  Patient identified by MRN, date of birth, ID band Patient awake    Reviewed: Allergy & Precautions, H&P , NPO status , Patient's Chart, lab work & pertinent test results, reviewed documented beta blocker date and time   History of Anesthesia Complications Negative for: history of anesthetic complications  Airway Mallampati: I TM Distance: >3 FB Neck ROM: full    Dental  (+) Teeth Intact   Pulmonary shortness of breath and with exertion,  breath sounds clear to auscultation        Cardiovascular hypertension, On Medications and On Home Beta Blockers + dysrhythmias Rhythm:regular     Neuro/Psych negative neurological ROS  negative psych ROS   GI/Hepatic negative GI ROS, Neg liver ROS,   Endo/Other  negative endocrine ROS  Renal/GU negative Renal ROS  negative genitourinary   Musculoskeletal   Abdominal   Peds  Hematology  (+) Blood dyscrasia, anemia , Pt has myelodysplastic syndrome low grade  Pt stated received platelets last pm   Anesthesia Other Findings See surgeon's H&P   Reproductive/Obstetrics negative OB ROS                         Anesthesia Physical Anesthesia Plan  ASA: II  Anesthesia Plan: General ETT and General   Post-op Pain Management: MAC Combined w/ Regional for Post-op pain   Induction: Intravenous  Airway Management Planned: Oral ETT  Additional Equipment:   Intra-op Plan:   Post-operative Plan: Extubation in OR  Informed Consent: I have reviewed the patients History and Physical, chart, labs and discussed the procedure including the risks, benefits and alternatives for the proposed anesthesia with the patient or authorized representative who has indicated his/her understanding and acceptance.   Dental Advisory Given  Plan Discussed with: Anesthesiologist, CRNA and Surgeon  Anesthesia Plan Comments:        Anesthesia Quick  Evaluation

## 2013-02-02 NOTE — Anesthesia Procedure Notes (Signed)
Anesthesia Regional Block:  Femoral nerve block  Pre-Anesthetic Checklist: ,, timeout performed, Correct Patient, Correct Site, Correct Laterality, Correct Procedure, Correct Position, site marked, Risks and benefits discussed,  Surgical consent,  Pre-op evaluation,  At surgeon's request and post-op pain management  Laterality: Right  Prep: chloraprep       Needles:   Needle Type: Other     Needle Length: 9cm  Needle Gauge: 21    Additional Needles:  Procedures: ultrasound guided (picture in chart) Femoral nerve block Narrative:  Start time: 02/02/2013 6:49 AM End time: 02/02/2013 6:56 AM Injection made incrementally with aspirations every 5 mL.  Performed by: Personally  Anesthesiologist: C. Raiden Haydu MD  Additional Notes: Ultrasound guidance used to: id relevant anatomy, confirm needle position, local anesthetic spread, avoidance of vascular puncture. Picture saved. No complications. Block performed personally by Janetta Hora. Gelene Mink, MD    Femoral nerve block

## 2013-02-02 NOTE — Clinical Social Work Psychosocial (Signed)
Clinical Social Work Department  BRIEF PSYCHOSOCIAL ASSESSMENT  Patient: Eric [name] Account Number: [id number]  Admit date: 02/02/2013 Clinical Social Worker Sabino Niemann, MSW Date/Time:  Referred by: Physician Date Referred: 02/02/2013 Referred for   SNF Placement   Other Referral:  Interview type: Patient and spouse Other interview type: PSYCHOSOCIAL DATA  Living Status: Spouse Admitted from facility:  Level of care:  Primary support name:Eric [name]   Primary support relationship to patient: wife Degree of support available:  Strong and vested  CURRENT CONCERNS  Current Concerns   Post-Acute Placement   Other Concerns:  SOCIAL WORK ASSESSMENT / PLAN  CSW met with pt re: PT has not made a recommendation but SW started SNF process anyway  Pt lives with wife in [place]  CSW explained placement process and answered questions.   Pt reports [facility]  as his preference    CSW completed FL2 and initiated SNF search.     Assessment/plan status: Information/Referral to [facility]  Other assessment/ plan:  Information/referral to community resources:  SNF      PATIENT'S/FAMILY'S RESPONSE TO PLAN OF CARE:  Pt  Reports he is agreeable to ST SNF, if recommended by PT, in order to increase strength and independence with mobility prior to return home  Pt verbalized understanding of placement process and appreciation for CSW assist.   Sabino Niemann, MSW [phone]

## 2013-02-02 NOTE — Anesthesia Postprocedure Evaluation (Signed)
Anesthesia Post Note  Patient: Eric [name]  Procedure(s) Performed: Procedure(s) (LRB): TOTAL KNEE ARTHROPLASTY (Right)  Anesthesia type: general  Patient location: PACU  Post pain: Pain level controlled  Post assessment: Patient's Cardiovascular Status Stable  Last Vitals:  Filed Vitals:   02/02/13 1115  BP: 133/60  Pulse: 63  Temp: 36.7 C  Resp: 14    Post vital signs: Reviewed and stable  Level of consciousness: sedated  Complications: No apparent anesthesia complications

## 2013-02-02 NOTE — Evaluation (Signed)
Physical Therapy Evaluation Patient Details Name: Eric [name] MRN: [id number] DOB: Mar 10, 1946 Today's Date: 02/02/2013 Time: 4098-1191 PT Time Calculation (min): 33 min  PT Assessment / Plan / Recommendation Clinical Impression  Pt is a 67 y/o s/p R TKA.  Pt doing exceptionally well with PT.  Pt ambulated 50 feet with RW and supervison and was able to perform independent SLRs.  Pt is an excellent candidate to d/c home on POD #1.  Acute PT to follow pt to promote mobility, safety and independence.      PT Assessment  Patient needs continued PT services    Follow Up Recommendations  Home health PT    Does the patient have the potential to tolerate intense rehabilitation      Barriers to Discharge None      Equipment Recommendations  Rolling walker with 5" wheels;None recommended by PT (3 in 1)    Recommendations for Other Services     Frequency 7X/week    Precautions / Restrictions Precautions Precautions: Knee Precaution Comments: Educated pt on knee positioning.  Required Braces or Orthoses: Knee Immobilizer - Right Restrictions Weight Bearing Restrictions: Yes RLE Weight Bearing: Weight bearing as tolerated   Pertinent Vitals/Pain 2-3/10 pain in knee.  Pt medicated by RN during session.       Mobility  Bed Mobility Bed Mobility: Supine to Sit Supine to Sit: 5: Supervision;HOB flat Details for Bed Mobility Assistance: vc for technique. Pt able to manage RLE independently Transfers Transfers: Sit to Stand;Stand to Sit Sit to Stand: 5: Supervision;From bed;With upper extremity assist Stand to Sit: 5: Supervision;To chair/3-in-1;With upper extremity assist Details for Transfer Assistance: vcs for hand placement Ambulation/Gait Ambulation/Gait Assistance: 5: Supervision Ambulation Distance (Feet): 50 Feet Assistive device: Rolling walker Ambulation/Gait Assistance Details: vcs for gait sequencing.  Gait Pattern: Step-to pattern Stairs: No Wheelchair  Mobility Wheelchair Mobility: No    Exercises Total Joint Exercises Ankle Circles/Pumps: 10 reps;Both Straight Leg Raises: AROM;5 reps;Right (Pt able to perform SLR independently)   PT Diagnosis: Acute pain;Abnormality of gait  PT Problem List: Decreased strength;Decreased mobility;Pain;Decreased knowledge of precautions;Decreased range of motion;Obesity;Decreased knowledge of use of DME PT Treatment Interventions: Gait training;DME instruction;Stair training;Functional mobility training;Therapeutic activities;Therapeutic exercise;Patient/family education   PT Goals Acute Rehab PT Goals PT Goal Formulation: With patient Time For Goal Achievement: 02/09/13 Potential to Achieve Goals: Good Pt will go Supine/Side to Sit: Independently PT Goal: Supine/Side to Sit - Progress: Goal set today Pt will go Sit to Supine/Side: Independently PT Goal: Sit to Supine/Side - Progress: Goal set today Pt will Transfer Bed to Chair/Chair to Bed: with modified independence PT Transfer Goal: Bed to Chair/Chair to Bed - Progress: Goal set today Pt will Ambulate: >150 feet;with modified independence;with rolling walker PT Goal: Ambulate - Progress: Goal set today Pt will Go Up / Down Stairs: 3-5 stairs;with supervision;with rail(s) PT Goal: Up/Down Stairs - Progress: Goal set today Pt will Perform Home Exercise Program: Independently PT Goal: Perform Home Exercise Program - Progress: Goal set today  Visit Information  Last PT Received On: 02/02/13 Assistance Needed: +1    Subjective Data  Subjective: Agree to PT Eval Patient Stated Goal: Walk without pain   Prior Functioning  Home Living Lives With: Spouse Available Help at Discharge: Family;Available 24 hours/day Type of Home: House Home Access: Stairs to enter [employer] of Steps: 3 Entrance Stairs-Rails: Right;Left;Can reach both Home Layout: One level Bathroom Shower/Tub: Tub/shower unit;Tub only;Walk-in shower Bathroom  Toilet: Standard Bathroom Accessibility: Yes How Accessible:  Accessible via wheelchair;Accessible via walker Home Adaptive Equipment: None Prior Function Level of Independence: Independent Able to Take Stairs?: Yes Driving: Yes Vocation: Retired Musician: No difficulties    Copywriter, advertising Overall Cognitive Status: Appears within functional limits for tasks assessed/performed Arousal/Alertness: Awake/alert Orientation Level: Oriented X4 / Intact Behavior During Session: WFL for tasks performed    Extremity/Trunk Assessment Right Lower Extremity Assessment RLE ROM/Strength/Tone: Unable to fully assess;Due to pain Left Lower Extremity Assessment LLE ROM/Strength/Tone: WFL for tasks assessed (pain in knee with gait. )   Balance    End of Session PT - End of Session Equipment Utilized During Treatment: Gait belt;Right knee immobilizer Activity Tolerance: Patient tolerated treatment well Patient left: in chair;with call bell/phone within reach;with family/visitor present Nurse Communication: Mobility status CPM Right Knee CPM Right Knee: Off Additional Comments: CPM off at 1615  GP     Eric [name] 02/02/2013, 5:07 PM Eric [name] DPT [phone]

## 2013-02-03 DIAGNOSIS — I10 Essential (primary) hypertension: Secondary | ICD-10-CM

## 2013-02-03 LAB — CBC
MCV: 74.2 fL — ABNORMAL LOW (ref 78.0–100.0)
Platelets: 110 10*3/uL — ABNORMAL LOW (ref 150–400)
RBC: 2.71 MIL/uL — ABNORMAL LOW (ref 4.22–5.81)
WBC: 8.8 10*3/uL (ref 4.0–10.5)

## 2013-02-03 MED ORDER — METOCLOPRAMIDE HCL 5 MG/ML IJ SOLN
10.0000 mg | Freq: Four times a day (QID) | INTRAMUSCULAR | Status: DC | PRN
  Administered 2013-02-03 (×3): 10 mg via INTRAVENOUS
  Filled 2013-02-03 (×3): qty 2

## 2013-02-03 MED ORDER — ENOXAPARIN SODIUM 30 MG/0.3ML ~~LOC~~ SOLN
30.0000 mg | SUBCUTANEOUS | Status: DC
  Administered 2013-02-04 – 2013-02-05 (×2): 30 mg via SUBCUTANEOUS
  Filled 2013-02-03 (×2): qty 0.3

## 2013-02-03 NOTE — Progress Notes (Signed)
Occupational Therapy Evaluation Patient Details Name: Eric [name] MRN: [id number] DOB: 15-May-1946 Today's Date: 02/03/2013 Time: 4098-1191 OT Time Calculation (min): 16 min  OT Assessment / Plan / Recommendation Clinical Impression  67 yo s/p R TKA. Pt will benefit from skilled OT to complete ADL education with pt'/wife to facilitate safe D/C home.     OT Assessment  Patient needs continued OT Services    Follow Up Recommendations  No OT follow up    Barriers to Discharge None    Equipment Recommendations  None recommended by OT    Recommendations for Other Services    Frequency  Min 2X/week    Precautions / Restrictions Precautions Precautions: Knee Required Braces or Orthoses: Knee Immobilizer - Right Restrictions RLE Weight Bearing: Weight bearing as tolerated   Pertinent Vitals/Pain Pain6/10. nsg aware    ADL  Grooming: Set up Upper Body Bathing: Set up Lower Body Bathing: Moderate assistance Upper Body Dressing: Set up Lower Body Dressing: Moderate assistance Toilet Transfer: Minimal assistance Tub/Shower Transfer: Other (comment) (demonstrated shower technique) Transfers/Ambulation Related to ADLs: S ADL Comments: limited today due to pain and nausea    OT Diagnosis: Generalized weakness;Acute pain  OT Problem List: Decreased strength;Decreased range of motion;Decreased activity tolerance;Decreased knowledge of use of DME or AE;Decreased knowledge of precautions;Obesity;Pain OT Treatment Interventions: Self-care/ADL training;Energy conservation;DME and/or AE instruction;Therapeutic activities;Patient/family education   OT Goals Acute Rehab OT Goals OT Goal Formulation: With patient Time For Goal Achievement: 02/17/13 Potential to Achieve Goals: Good ADL Goals Pt Will Perform Tub/Shower Transfer: Shower transfer;with caregiver independent in assisting;Ambulation;with DME;with supervision ADL Goal: Tub/Shower Transfer - Progress: Goal set  today Additional ADL Goal #1: pt/wife will verbalize understaning of compensatory techniques for ADL.AE to increase independence ADL Goal: Additional Goal #1 - Progress: Goal set today  Visit Information  Last OT Received On: 02/03/13 Assistance Needed: +1    Subjective Data      Prior Functioning     Home Living Lives With: Spouse Available Help at Discharge: Family;Available 24 hours/day Type of Home: House Home Access: Stairs to enter [employer] of Steps: 3 Entrance Stairs-Rails: Right;Left;Can reach both Home Layout: One level Bathroom Shower/Tub: Tub/shower unit;Tub only;Walk-in shower Bathroom Toilet: Standard Bathroom Accessibility: Yes How Accessible: Accessible via wheelchair;Accessible via walker Home Adaptive Equipment: None Prior Function Level of Independence: Independent Able to Take Stairs?: Yes Driving: Yes Vocation: Retired Musician: No difficulties         Vision/Perception     Copywriter, advertising Overall Cognitive Status: Appears within functional limits for tasks assessed/performed Arousal/Alertness: Awake/alert    Extremity/Trunk Assessment Right Upper Extremity Assessment RUE ROM/Strength/Tone: WFL for tasks assessed Left Upper Extremity Assessment LUE ROM/Strength/Tone: WFL for tasks assessed Right Lower Extremity Assessment RLE ROM/Strength/Tone: Deficits;Unable to fully assess;Due to pain;Due to precautions Left Lower Extremity Assessment LLE ROM/Strength/Tone: [facility] for tasks assessed (c/o knee pain) Trunk Assessment Trunk Assessment: Normal     Mobility Transfers Sit to Stand: 5: Supervision Stand to Sit: 5: Supervision Details for Transfer Assistance: S     Exercise     Balance  WFL   End of Session OT - End of Session Activity Tolerance: Patient limited by fatigue;Patient limited by pain;Other (comment) (limited by nausea) Patient left: in chair;with call bell/phone within reach;with  family/visitor present Nurse Communication: Mobility status  GO     Eric [name],HILLARY 02/03/2013, 3:34 PM [facility], OTR/L  [phone] 02/03/2013

## 2013-02-03 NOTE — Progress Notes (Signed)
PT Cancellation Note  Patient Details Name: Eric [name] MRN: [id number] DOB: Aug 14, 1946   Cancelled Treatment:    Reason Eval/Treat Not Completed: Medical issues which prohibited therapy (Pt Hgb 6.9 receiving transfusion )   Alferd Apa 02/03/2013, 11:55 AM

## 2013-02-03 NOTE — Progress Notes (Signed)
Physical Therapy Treatment Patient Details Name: Eric [name] MRN: [id number] DOB: 1946/05/15 Today's Date: 02/03/2013 Time: 1420-1505 PT Time Calculation (min): 45 min  PT Assessment / Plan / Recommendation Comments on Treatment Session  Pt doing well with mobility. Fatigued quickly.  Pt breathing heavily but spouse states that is how he always breathes.      Follow Up Recommendations  Home health PT     Does the patient have the potential to tolerate intense rehabilitation     Barriers to Discharge        Equipment Recommendations  Rolling walker with 5" wheels;None recommended by PT    Recommendations for Other Services    Frequency 7X/week   Plan Discharge plan remains appropriate;Frequency remains appropriate    Precautions / Restrictions Precautions Precautions: Knee Required Braces or Orthoses: Knee Immobilizer - Right Restrictions Weight Bearing Restrictions: Yes RLE Weight Bearing: Weight bearing as tolerated   Pertinent Vitals/Pain 6/10 pain in knee.  Pt medicated prior to session.     Mobility  Bed Mobility Bed Mobility: Supine to Sit Supine to Sit: 5: Supervision;HOB flat Details for Bed Mobility Assistance: Pt able to manage RLE independently Transfers Transfers: Sit to Stand;Stand to Sit Sit to Stand: 6: Modified independent (Device/Increase time);From bed;With upper extremity assist Stand to Sit: 5: Supervision;To chair/3-in-1;With upper extremity assist Details for Transfer Assistance: VCx for hand placement.  Ambulation/Gait Ambulation/Gait Assistance: 5: Supervision Ambulation Distance (Feet): 30 Feet Assistive device: Rolling walker Ambulation/Gait Assistance Details: No instruction required.  Gait Pattern: Step-to pattern Stairs: No Wheelchair Mobility Wheelchair Mobility: No    Exercises Total Joint Exercises Ankle Circles/Pumps: 10 reps;Both Quad Sets: 10 reps;Right;AROM Short Arc Quad: 10 reps;AAROM;Right Heel Slides: Right;10  reps;AAROM Knee Flexion: 5 reps;Right;AAROM;Seated Goniometric ROM: 6-90 degrees AAROM in R Knee   PT Diagnosis:    PT Problem List:   PT Treatment Interventions:     PT Goals Acute Rehab PT Goals PT Goal Formulation: With patient Time For Goal Achievement: 02/09/13 Potential to Achieve Goals: Good Pt will go Supine/Side to Sit: Independently PT Goal: Supine/Side to Sit - Progress: Progressing toward goal Pt will go Sit to Supine/Side: Independently PT Goal: Sit to Supine/Side - Progress: Progressing toward goal Pt will Transfer Bed to Chair/Chair to Bed: with modified independence PT Transfer Goal: Bed to Chair/Chair to Bed - Progress: Progressing toward goal Pt will Ambulate: >150 feet;with modified independence;with rolling walker PT Goal: Ambulate - Progress: Progressing toward goal Pt will Go Up / Down Stairs: 3-5 stairs;with supervision;with rail(s) Pt will Perform Home Exercise Program: Independently PT Goal: Perform Home Exercise Program - Progress: Progressing toward goal  Visit Information  Last PT Received On: 02/03/13 Assistance Needed: +1    Subjective Data  Subjective: I still feel sick but I will try Patient Stated Goal: Walk without pain   Cognition  Cognition Overall Cognitive Status: Appears within functional limits for tasks assessed/performed Arousal/Alertness: Awake/alert Orientation Level: Oriented X4 / Intact Behavior During Session: [name] for tasks performed    Balance     End of Session PT - End of Session Equipment Utilized During Treatment: Gait belt;Right knee immobilizer Activity Tolerance: Patient tolerated treatment well Patient left: in chair;with call bell/phone within reach;with family/visitor present Nurse Communication: Mobility status   GP     Eric [name] 02/03/2013, 6:42 PM Eric [name] L. Mena Simonis DPT [phone]

## 2013-02-03 NOTE — Progress Notes (Signed)
[facility] PROGRESS NOTE  Eric [name] AVW:[id number] DOB: [birth year] DOA: 02/02/2013 PCP: [name], MD  Assessment/Plan: 1. MDS - with chronic anemia and thrombocytopenia (baseline Hb around 8.5, baseline platelet count around 80)  2. Acute blood loss anemia post op on 02/03/13 Hb was 6.9 - ordered transfusion of 1 unit of prbcs on 02/03/13  3. Nausea and bloating with probable mild ileus related to opiates on 3/1/4 - plan for reglan  4. RA 5. S/ p TKR 02/02/13 4.    HTN   Code Status: full  Family Communication: wife at bedside  Disposition Plan: per primary team    HPI/Subjective: C/o nausea and bloating   Objective: Filed Vitals:   02/03/13 0000 02/03/13 0138 02/03/13 0400 02/03/13 0536  BP:  149/73  150/66  Pulse:  90  87  Temp:  98.4 F (36.9 C)  97.9 F (36.6 C)  TempSrc:  Oral  Oral  Resp: 18 18 18 16   SpO2:  97%  98%   Patient Vitals for the past 24 hrs:  BP Temp Temp src Pulse Resp SpO2  02/03/13 0536 150/66 mmHg 97.9 F (36.6 C) Oral 87 16 98 %  02/03/13 0400 - - - - 18 -  02/03/13 0138 149/73 mmHg 98.4 F (36.9 C) Oral 90 18 97 %  02/03/13 0000 - - - - 18 -  02/02/13 2120 135/62 mmHg 97.7 F (36.5 C) Oral 78 16 99 %  02/02/13 2000 - - - - 18 -  02/02/13 1530 - - - - 18 -  02/02/13 1517 122/55 mmHg 97.7 F (36.5 C) - 76 18 100 %  02/02/13 1200 - - - - 18 -  02/02/13 1142 126/59 mmHg 97.9 F (36.6 C) - 64 18 100 %  02/02/13 1130 - - - 68 17 100 %  02/02/13 1115 133/60 mmHg 98 F (36.7 C) - 63 14 100 %  02/02/13 1100 133/59 mmHg - - 65 15 100 %  02/02/13 1045 136/65 mmHg - - 61 17 97 %  02/02/13 1030 132/63 mmHg - - 65 12 100 %  02/02/13 1015 144/64 mmHg 97.9 F (36.6 C) - 71 14 99 %     Intake/Output Summary (Last 24 hours) at 02/03/13 0832 Last data filed at 02/03/13 0610  Gross per 24 hour  Intake   3080 ml  Output   3250 ml  Net   -170 ml   There were no vitals filed for this visit.  Exam:   General:   axox3  Cardiovascular: rrr  Respiratory: ctab   Abdomen: distended, tympanitic   Data Reviewed: Basic Metabolic Panel:  Recent Labs Lab 02/02/13 1409 02/02/13 1522  NA 137 135  K 4.4 4.4  CL 101 98  CO2 26 25  GLUCOSE 146* 188*  BUN 21 21  CREATININE 0.83 0.84  CALCIUM 9.4 8.8   Liver Function Tests: No results found for this basename: AST, ALT, ALKPHOS, BILITOT, PROT, ALBUMIN,  in the last 168 hours No results found for this basename: LIPASE, AMYLASE,  in the last 168 hours No results found for this basename: AMMONIA,  in the last 168 hours CBC:  Recent Labs Lab 01/31/13 0919 02/02/13 1409 02/02/13 1522 02/03/13 0635  WBC 4.3 5.6 5.9 8.8  NEUTROABS 2.3  --   --   --   HGB 8.4* 7.7* 7.0* 6.9*  HCT 25.9* 23.2* 21.1* 20.1*  MCV 75.3* 74.4* 74.0* 74.2*  PLT 75* 82* 81* 110*   Cardiac  Enzymes: No results found for this basename: CKTOTAL, CKMB, CKMBINDEX, TROPONINI,  in the last 168 hours BNP (last 3 results) No results found for this basename: PROBNP,  in the last 8760 hours CBG: No results found for this basename: GLUCAP,  in the last 168 hours  Recent Results (from the past 240 hour(s))  SURGICAL PCR SCREEN     Status: None   Collection Time    01/26/13 10:03 AM      Result Value Range Status   MRSA, PCR NEGATIVE  NEGATIVE Final   Staphylococcus aureus NEGATIVE  NEGATIVE Final   Comment:            The Xpert SA Assay (FDA     approved for NASAL specimens     in patients over 38 years of age),     is one component of     a comprehensive surveillance     program.  Test performance has     been validated by [employer] for patients greater     than or equal to 6 year old.     It is not intended     to diagnose infection nor to     guide or monitor treatment.  URINE CULTURE     Status: None   Collection Time    01/26/13 10:17 AM      Result Value Range Status   Specimen Description URINE, CLEAN CATCH   Final   Special Requests NONE   Final    Culture  Setup Time 01/26/2013 11:52   Final   Colony Count NO GROWTH   Final   Culture NO GROWTH   Final   Report Status 01/27/2013 FINAL   Final     Studies: No results found.  Scheduled Meds: . acetaminophen  1,000 mg Intravenous Q6H  . docusate sodium  100 mg Oral BID  . enoxaparin (LOVENOX) injection  30 mg Subcutaneous Q12H  . lisinopril  10 mg Oral Daily   And  . hydrochlorothiazide  12.5 mg Oral Daily  . loratadine  10 mg Oral Daily  . metoprolol  25 mg Oral BID  . omega-3 acid ethyl esters  2 g Oral BID  . predniSONE  5 mg Oral Q breakfast   Continuous Infusions: . sodium chloride 75 mL/hr at 02/03/13 0610    Principal Problem:   Osteoarthritis of right knee Active Problems:   MDS (myelodysplastic syndrome), low grade   Rheumatoid arthritis   Anemia   Thrombocytopenia, unspecified   HTN (hypertension)      Eric [name]  [facility] Pager [phone]. If 8PM-8AM, please contact night-coverage at [email or web address], password [facility] 02/03/2013, 8:32 AM  LOS: 1 day

## 2013-02-03 NOTE — Progress Notes (Signed)
Subjective: 1 Day Post-Op Procedure(s) (LRB): TOTAL KNEE ARTHROPLASTY (Right) Patient reports pain as 6 on 0-10 scale and moderate.    Objective: Vital signs in last 24 hours: Temp:  [97.7 F (36.5 C)-98.4 F (36.9 C)] 97.9 F (36.6 C) (03/01 0536) Pulse Rate:  [61-90] 87 (03/01 0536) Resp:  [12-18] 16 (03/01 0536) BP: (122-150)/(55-73) 150/66 mmHg (03/01 0536) SpO2:  [97 %-100 %] 98 % (03/01 0536)  Intake/Output from previous day: 02/28 0701 - 03/01 0700 In: 4080 [P.O.:1080; I.V.:3000] Out: 3250 [Urine:2500; Drains:750] Intake/Output this shift: Total I/O In: -  Out: 200 [Urine:200]   Recent Labs  02/02/13 1409 02/02/13 1522 02/03/13 0635  HGB 7.7* 7.0* 6.9*    Recent Labs  02/02/13 1522 02/03/13 0635  WBC 5.9 8.8  RBC 2.85* 2.71*  HCT 21.1* 20.1*  PLT 81* 110*    Recent Labs  02/02/13 1409 02/02/13 1522  NA 137 135  K 4.4 4.4  CL 101 98  CO2 26 25  BUN 21 21  CREATININE 0.83 0.84  GLUCOSE 146* 188*  CALCIUM 9.4 8.8   No results found for this basename: LABPT, INR,  in the last 72 hours  Neurovascular intact  Assessment/Plan: 1 Day Post-Op Procedure(s) (LRB): TOTAL KNEE ARTHROPLASTY (Right) Transfuse additional unit PRBC, decrease lovenox qd  Breland Trouten JR,W D 02/03/2013, 9:53 AM

## 2013-02-03 NOTE — Progress Notes (Signed)
PHYSICAL THERAPY PROGRESS NOTE 02/03/2013   02/03/13 1400  PT Visit Information  Last PT Received On 02/03/13  PT Time Calculation  PT Start Time 1210  PT Stop Time 1225  PT Time Calculation (min) 15 min  Subjective Data  Subjective I feel nauseated. Pt agreed to ther ex with PT.    Patient Stated Goal Walk without pain  Precautions  Precautions Knee  Required Braces or Orthoses Knee Immobilizer - Right  Restrictions  Weight Bearing Restrictions Yes  RLE Weight Bearing WBAT  Cognition  Overall Cognitive Status Appears within functional limits for tasks assessed/performed  Arousal/Alertness Awake/alert  Orientation Level Oriented X4 / Intact  Behavior During Session [facility] for tasks performed  Bed Mobility  Bed Mobility Not assessed  Transfers  Transfers Not assessed  Ambulation/Gait  Ambulation/Gait Assistance Not tested (comment)  Wheelchair Mobility  Wheelchair Mobility No  Exercises  Exercises Total Joint  Total Joint Exercises  Ankle Circles/Pumps 10 reps;Both  Quad Sets 10 reps;Right;AROM  Short Arc Quad 10 reps;AAROM;Right  Heel Slides Right;10 reps;AAROM  PT - End of Session  Activity Tolerance Patient tolerated treatment well  Patient left in bed;in CPM;with call bell/phone within reach  Nurse Communication Mobility status  PT - Assessment/Plan  Comments on Treatment Session Pt receiving 1st unit of blood for low Hgb.  Agree to ther ex.  Will see pt again later for mobility training.    PT Plan Discharge plan remains appropriate;Frequency remains appropriate  PT Frequency 7X/week  Follow Up Recommendations Home health PT  PT equipment Rolling walker with 5" wheels;None recommended by PT  Acute Rehab PT Goals  PT Goal Formulation With patient  Time For Goal Achievement 02/09/13  Potential to Achieve Goals Good  Pt will go Supine/Side to Sit Independently  Pt will Perform Home Exercise Program Independently  PT Goal: Perform Home Exercise Program - Progress  Progressing toward goal  PT General Charges  $$ ACUTE PT VISIT 1 Procedure  PT Treatments  $Therapeutic Exercise 8-22 mins  Shriyan Arakawa L. Lorana Maffeo DPT [phone]

## 2013-02-03 NOTE — Op Note (Signed)
NAME:  Eric [name], Eric [name] NO.:  [id number]  MEDICAL RECORD NO.:  [id number]  LOCATION:  [id number]                        FACILITY:  [facility]  PHYSICIAN:  Dyke Brackett, M.D.    DATE OF BIRTH:  [birth year]  DATE OF PROCEDURE:  02/02/2013 DATE OF DISCHARGE:                              OPERATIVE REPORT   PREOPERATIVE DIAGNOSIS:  Severe osteoarthritis of right knee.  POSTOPERATIVE DIAGNOSIS:  Severe osteoarthritis of right knee.  OPERATION:  Right total knee replacement ([employer] cemented knee size 4 femur, tibia, 10-mm bearing, 38-mm all poly patella).  TOURNIQUET TIME:  63 minutes.  ASSISTANT:  Margart Sickles, PA-C  ANESTHESIA:  General with nerve block.  DESCRIPTION OF PROCEDURE:  Sterile prep and drape, exsanguination of the leg, inflation of 350, straight skin incision in medial parapatellar approach made on the knee.  We cut 11 degrees off the distal femur with a 5-degree valgus cut due to a mild flexion contracture, then cut about 4 degrees below the most diseased medial compartment on the tibial side, although at the conclusion of placement of trial, we did a revision tibia cut to gain more motion in both flexion and extension, this resecting probably 5-6 mm below the tibia.  Extension gap was noted to be about 0 with the spacer in.  Femur was sized to be a size 4.  We placed the intramedullary guide with the proper rotation, performed anterior, posterior and chamfer cuts, flexion gap eventually mentioned, extension gap at 10 mm.  Carolin Guernsey was cut for the tibial prosthesis.  We then placed the tibial trial.  Box cut was made on the femur.  Trial femur was placed.  Patella was cut leaving about 15 mm of native patella for an all poly patella.  Again, the trials were placed.  We noted to be somewhat tight in both flexion and extension, and resected an additional 2 mm of tibia, then re-trialed.  Full extension was obtained.  Good stability in flexion.  We did  extensive medial reefing and stripping to relieve the contracted medial collateral ligament as well.  Final components were then inserted in after pulsatile lavage.  We did elect to use antibiotic impregnated cement due to the patient being on relatively high-dose prednisone and a history of a myeloproliferative disorder.  This was mailed to harden.  Small amount of cement was removed.  We did this with the trial bearing.  Once the trial bearing was removed, we removed some excess cement. Then, once the tourniquet was released, no excess bleeding was noted. We then closed with interrupted Ethibond #1 followed by 0, 2-0 Vicryl, and skin clips.  Hemovac drain was placed exiting superolaterally. Taken to the recovery room in stable condition.     Dyke Brackett, M.D.     WDC/MEDQ  D:  02/02/2013  T:  02/03/2013  Job:  [id number]

## 2013-02-04 LAB — TYPE AND SCREEN
ABO/RH(D): O POS
Antibody Screen: NEGATIVE
Unit division: 0

## 2013-02-04 LAB — BASIC METABOLIC PANEL
BUN: 9 mg/dL (ref 6–23)
Calcium: 9.2 mg/dL (ref 8.4–10.5)
GFR calc Af Amer: >90 mL/min (ref >90)
GFR calc non Af Amer: >90 mL/min (ref >90)
Glucose, Bld: 170 mg/dL — ABNORMAL HIGH (ref 70–99)
Potassium: 4.1 mEq/L (ref 3.5–5.1)
Sodium: 132 mEq/L — ABNORMAL LOW (ref 135–145)

## 2013-02-04 LAB — CBC
MCH: 26 pg (ref 26.0–34.0)
MCHC: 34 g/dL (ref 30.0–36.0)
RDW: 23.8 % — ABNORMAL HIGH (ref 11.5–15.5)

## 2013-02-04 MED ORDER — POLYETHYLENE GLYCOL 3350 17 G PO PACK
34.0000 g | PACK | Freq: Once | ORAL | Status: AC
  Administered 2013-02-04: 34 g via ORAL
  Filled 2013-02-04 (×2): qty 1

## 2013-02-04 MED ORDER — FLEET ENEMA 7-19 GM/118ML RE ENEM: 1.0000 | ENEMA | Freq: Every day | RECTAL | Status: DC | PRN

## 2013-02-04 MED ORDER — TEMAZEPAM 15 MG PO CAPS
15.0000 mg | ORAL_CAPSULE | Freq: Every evening | ORAL | Status: DC | PRN
  Administered 2013-02-04: 15 mg via ORAL
  Filled 2013-02-04: qty 1

## 2013-02-04 NOTE — Progress Notes (Signed)
Physical Therapy Treatment Patient Details Name: Eric [name] MRN: [id number] DOB: 04-19-1946 Today's Date: 02/04/2013 Time: 7829-5621 PT Time Calculation (min): 25 min  PT Assessment / Plan / Recommendation Comments on Treatment Session  Pt is a 67 y.o./M s/p right TKA. Pt progressing very well towards his PT goals with incr gait distance, incr exercises perfomed and less assistance/cues needed today. Initiated stair instruction this pm, will need a review prior to discharge home.  Remains appropriate for HHPT post acute stay.     Follow Up Recommendations  Home health PT           Equipment Recommendations  Rolling walker with 5" wheels       Frequency 7X/week   Plan Discharge plan remains appropriate;Frequency remains appropriate    Precautions / Restrictions Precautions Precautions: Knee Required Braces or Orthoses: Knee Immobilizer - Right Knee Immobilizer - Right: On when out of bed or walking Restrictions RLE Weight Bearing: Weight bearing as tolerated       Mobility  Bed Mobility Bed Mobility: Sit to Supine Supine to Sit: 5: Supervision;HOB elevated (HOB approx 30 degrees elevated) Sit to Supine: 5: Supervision;HOB flat Details for Bed Mobility Assistance: pt educated to use a belt to assist with getting his right leg out of bed with sitting up and to use his left leg to lift the right leg with lying down. Pt to keep practicing both methods to see which he prefers. Transfers Sit to Stand: 6: Modified independent (Device/Increase time);From bed;With upper extremity assist Stand to Sit: 6: Modified independent (Device/Increase time);To bed;With upper extremity assist Details for Transfer Assistance: no cues or assistance needed with transfers Ambulation/Gait Ambulation/Gait Assistance: 5: Supervision Ambulation Distance (Feet): 100 Feet Assistive device: Rolling walker Ambulation/Gait Assistance Details: min vc's for posture and walker position with gait Gait  Pattern: Step-through pattern;Antalgic;Decreased stance time - right;Decreased step length - left Stairs: Yes Stairs Assistance: 4: Min guard Stairs Assistance Details (indicate cue type and reason): min vc's on correct sequence/technique with stair management. Stair Management Technique: Step to pattern;Two rails;Forwards Number of Stairs: 3    Exercises Total Joint Exercises Ankle Circles/Pumps: AROM;Both;10 reps;Supine Quad Sets: AROM;Strengthening;Right;10 reps;Supine Heel Slides: AAROM;Strengthening;Right;10 reps;Supine Hip ABduction/ADduction: AAROM;Strengthening;Right;10 reps;Supine Straight Leg Raises: AAROM;Strengthening;Right;10 reps;Supine    PT Goals Acute Rehab PT Goals PT Goal: Supine/Side to Sit - Progress: Progressing toward goal PT Goal: Sit to Supine/Side - Progress: Progressing toward goal PT Transfer Goal: Bed to Chair/Chair to Bed - Progress: Met PT Goal: Ambulate - Progress: Progressing toward goal PT Goal: Up/Down Stairs - Progress: Progressing toward goal PT Goal: Perform Home Exercise Program - Progress: Progressing toward goal  Visit Information  Last PT Received On: 02/04/13 Assistance Needed: +1    Subjective Data  Subjective: Still with some nausea, agreeable to therapy at this time. RN in room to give Tylenol just prior to out of bed.   Cognition  Cognition Overall Cognitive Status: Appears within functional limits for tasks assessed/performed Arousal/Alertness: Awake/alert Orientation Level: Appears intact for tasks assessed Behavior During Session: [facility] for tasks performed       End of Session PT - End of Session Equipment Utilized During Treatment: Gait belt;Right knee immobilizer Activity Tolerance: Patient tolerated treatment well Patient left: in bed;with call bell/phone within reach;with family/visitor present Nurse Communication: Mobility status   GP     Sallyanne Kuster 02/04/2013, 1:43 PM  Sallyanne Kuster, PTA Office- [phone]

## 2013-02-04 NOTE — Progress Notes (Signed)
Physical Therapy Treatment Patient Details Name: Eric [name] MRN: [id number] DOB: [birth year] Today's Date: 02/04/2013 Time: 6578-4696 PT Time Calculation (min): 24 min  PT Assessment / Plan / Recommendation Comments on Treatment Session  Pt is a 66 y.o./M s/p right TKA. Pt progressing very well towards his PT goals with incr gait distance, incr exercises perfomed and less assistance/cues needed today. Remains appropriate for HHPT post acute stay.     Follow Up Recommendations  Home health PT           Equipment Recommendations  Rolling walker with 5" wheels       Frequency 7X/week   Plan Discharge plan remains appropriate;Frequency remains appropriate    Precautions / Restrictions Precautions Precautions: None Required Braces or Orthoses: Knee Immobilizer - Right Knee Immobilizer - Right: On when out of bed or walking Restrictions RLE Weight Bearing: Weight bearing as tolerated       Mobility  Bed Mobility Bed Mobility: Not assessed Transfers Sit to Stand: 6: Modified independent (Device/Increase time);From chair/3-in-1;With upper extremity assist;With armrests Stand to Sit: 6: Modified independent (Device/Increase time);To chair/3-in-1;With upper extremity assist;With armrests Details for Transfer Assistance: no cues or assistance needed with transfers Ambulation/Gait Ambulation/Gait Assistance: 5: Supervision Ambulation Distance (Feet): 80 Feet Assistive device: Rolling walker Ambulation/Gait Assistance Details: min vc's for posture, walker position and to incr left step length and right stance time. Gait Pattern: Step-to pattern;Decreased stride length;Trunk flexed;Antalgic;Decreased step length - left;Decreased stance time - right    Exercises Total Joint Exercises Ankle Circles/Pumps: AROM;Both;10 reps;Seated Quad Sets: AROM;Strengthening;Right;10 reps;Seated Heel Slides: AAROM;Strengthening;Right;10 reps;Seated Hip ABduction/ADduction:  AAROM;Strengthening;Right;10 reps;Seated Straight Leg Raises: AAROM;Strengthening;Right;10 reps;Seated    PT Goals Acute Rehab PT Goals PT Transfer Goal: Bed to Chair/Chair to Bed - Progress: Progressing toward goal PT Goal: Ambulate - Progress: Progressing toward goal PT Goal: Perform Home Exercise Program - Progress: Progressing toward goal  Visit Information  Last PT Received On: 02/04/13 Assistance Needed: +1    Subjective Data  Subjective: Reports feeling some nausea, however agreeable to therapy at this time.   Cognition  Cognition Overall Cognitive Status: Appears within functional limits for tasks assessed/performed Arousal/Alertness: Awake/alert Orientation Level: Appears intact for tasks assessed Behavior During Session: [facility] for tasks performed       End of Session PT - End of Session Equipment Utilized During Treatment: Gait belt;Right knee immobilizer Activity Tolerance: Patient tolerated treatment well Patient left: in chair;with call bell/phone within reach;with family/visitor present Nurse Communication: Mobility status   GP     Sallyanne Kuster 02/04/2013, 12:24 PM  Sallyanne Kuster, PTA Office- [phone]

## 2013-02-04 NOTE — Progress Notes (Signed)
[facility] PROGRESS NOTE  Eric [name]:[id number] DOB: August 28, 1946 DOA: 02/02/2013 PCP: [name], MD  Assessment/Plan: 1. MDS - with chronic anemia and thrombocytopenia (baseline Hb around 8.5, baseline platelet count around 80)  2. Acute blood loss anemia post op on 02/03/13 Hb was 6.9 - ordered transfusion of 1 unit of prbcs on 02/03/13 . By February 04, 2013 the hemoglobin is up to 8.4 which is close to patient's baseline.  3. Nausea and bloating with probable mild ileus related to opiates on 3/1/4 - plan for reglan  4. Constipation - patient was prescribed MiraLAX, Dulcolax suppository and enema on March 2 5. RA 6. S/ p TKR 02/02/13 7.    HTN  8.  Fever noted on March 1 - suspect reactive to the surgery and transfusion. No need for antibiotics. Monitor closely  Code Status: full  Family Communication: wife at bedside  Disposition Plan: per primary team    HPI/Subjective: C/o nausea and distended abdomen   Objective: Filed Vitals:   02/03/13 1949 02/03/13 2326 02/04/13 0714 02/04/13 0800  BP: 142/64 137/65 140/63   Pulse: 103 105 104   Temp: 98.9 F (37.2 C) 101.1 F (38.4 C) 98.2 F (36.8 C)   TempSrc:      Resp: 18 18 18 20   SpO2: 90% 97% 97%    Patient Vitals for the past 24 hrs:  BP Temp Temp src Pulse Resp SpO2  02/04/13 0800 - - - - 20 -  02/04/13 0714 140/63 mmHg 98.2 F (36.8 C) - 104 18 97 %  02/03/13 2326 137/65 mmHg 101.1 F (38.4 C) - 105 18 97 %  02/03/13 1949 142/64 mmHg 98.9 F (37.2 C) - 103 18 90 %  02/03/13 1830 146/55 mmHg 99.2 F (37.3 C) - 94 18 -  02/03/13 1715 152/57 mmHg 100.5 F (38.1 C) - 95 18 -  02/03/13 1630 155/63 mmHg 98.9 F (37.2 C) - 95 16 -  02/03/13 1600 - - - - 16 96 %  02/03/13 1442 172/66 mmHg 98.3 F (36.8 C) Oral 84 18 96 %  02/03/13 1315 162/65 mmHg 99.8 F (37.7 C) Oral 80 20 99 %  02/03/13 1215 170/64 mmHg 98.1 F (36.7 C) - 86 16 -  02/03/13 1115 174/57 mmHg 98.6 F (37 C) - 85 16 -  02/03/13 1038  174/66 mmHg 98.4 F (36.9 C) Oral 91 - 95 %     Intake/Output Summary (Last 24 hours) at 02/04/13 0907 Last data filed at 02/04/13 0715  Gross per 24 hour  Intake   1765 ml  Output      0 ml  Net   1765 ml   There were no vitals filed for this visit.  Exam:   General:  axox3  Cardiovascular: rrr  Respiratory: ctab   Abdomen: distended, tympanitic   Data Reviewed: Basic Metabolic Panel:  Recent Labs Lab 02/02/13 1409 02/02/13 1522  NA 137 135  K 4.4 4.4  CL 101 98  CO2 26 25  GLUCOSE 146* 188*  BUN 21 21  CREATININE 0.83 0.84  CALCIUM 9.4 8.8   Liver Function Tests: No results found for this basename: AST, ALT, ALKPHOS, BILITOT, PROT, ALBUMIN,  in the last 168 hours No results found for this basename: LIPASE, AMYLASE,  in the last 168 hours No results found for this basename: AMMONIA,  in the last 168 hours CBC:  Recent Labs Lab 01/31/13 0919 02/02/13 1409 02/02/13 1522 02/03/13 0635 02/04/13 0805  WBC  4.3 5.6 5.9 8.8 PENDING  NEUTROABS 2.3  --   --   --   --   HGB 8.4* 7.7* 7.0* 6.9* 8.4*  HCT 25.9* 23.2* 21.1* 20.1* 24.7*  MCV 75.3* 74.4* 74.0* 74.2* 76.5*  PLT 75* 82* 81* 110* PENDING   Cardiac Enzymes: No results found for this basename: CKTOTAL, CKMB, CKMBINDEX, TROPONINI,  in the last 168 hours BNP (last 3 results) No results found for this basename: PROBNP,  in the last 8760 hours CBG: No results found for this basename: GLUCAP,  in the last 168 hours  Recent Results (from the past 240 hour(s))  SURGICAL PCR SCREEN     Status: None   Collection Time    01/26/13 10:03 AM      Result Value Range Status   MRSA, PCR NEGATIVE  NEGATIVE Final   Staphylococcus aureus NEGATIVE  NEGATIVE Final   Comment:            The Xpert SA Assay (FDA     approved for NASAL specimens     in patients over 39 years of age),     is one component of     a comprehensive surveillance     program.  Test performance has     been validated by [employer]  for patients greater     than or equal to 55 year old.     It is not intended     to diagnose infection nor to     guide or monitor treatment.  URINE CULTURE     Status: None   Collection Time    01/26/13 10:17 AM      Result Value Range Status   Specimen Description URINE, CLEAN CATCH   Final   Special Requests NONE   Final   Culture  Setup Time 01/26/2013 11:52   Final   Colony Count NO GROWTH   Final   Culture NO GROWTH   Final   Report Status 01/27/2013 FINAL   Final     Studies: No results found.  Scheduled Meds: . docusate sodium  100 mg Oral BID  . enoxaparin (LOVENOX) injection  30 mg Subcutaneous Q24H  . lisinopril  10 mg Oral Daily   And  . hydrochlorothiazide  12.5 mg Oral Daily  . loratadine  10 mg Oral Daily  . metoprolol  25 mg Oral BID  . omega-3 acid ethyl esters  2 g Oral BID  . polyethylene glycol  34 g Oral Once  . predniSONE  5 mg Oral Q breakfast   Continuous Infusions:    Principal Problem:   Osteoarthritis of right knee Active Problems:   MDS (myelodysplastic syndrome), low grade   Rheumatoid arthritis   Anemia   Thrombocytopenia, unspecified   HTN (hypertension)      LAZA,SORIN  [facility] Pager [phone]. If 8PM-8AM, please contact night-coverage at [email or web address], password [facility] 02/04/2013, 9:07 AM  LOS: 2 days

## 2013-02-04 NOTE — Progress Notes (Signed)
Subjective: 2 Days Post-Op Procedure(s) (LRB): TOTAL KNEE ARTHROPLASTY (Right) Patient reports pain as mild.    Objective: Vital signs in last 24 hours: Temp:  [98.1 F (36.7 C)-101.1 F (38.4 C)] 98.2 F (36.8 C) (03/02 0714) Pulse Rate:  [80-105] 104 (03/02 0714) Resp:  [16-20] 20 (03/02 0800) BP: (137-172)/(55-66) 140/63 mmHg (03/02 0714) SpO2:  [90 %-99 %] 97 % (03/02 0714)  Intake/Output from previous day: 03/01 0701 - 03/02 0700 In: 1525 [P.O.:1200; Blood:325] Out: 200 [Urine:200] Intake/Output this shift: Total I/O In: 480 [P.O.:480] Out: -    Recent Labs  02/02/13 1409 02/02/13 1522 02/03/13 0635 02/04/13 0805  HGB 7.7* 7.0* 6.9* 8.4*    Recent Labs  02/03/13 0635 02/04/13 0805  WBC 8.8 13.0*  RBC 2.71* 3.23*  HCT 20.1* 24.7*  PLT 110* 77*    Recent Labs  02/02/13 1522 02/04/13 0805  NA 135 132*  K 4.4 4.1  CL 98 93*  CO2 25 29  BUN 21 9  CREATININE 0.84 0.80  GLUCOSE 188* 170*  CALCIUM 8.8 9.2   No results found for this basename: LABPT, INR,  in the last 72 hours  Neurovascular intact Sensation intact distally Intact pulses distally Dorsiflexion/Plantar flexion intact Incision: dressing C/D/I and scant drainage No cellulitis present Compartment soft Dressing changed, drain pulled  Assessment/Plan: 2 Days Post-Op Procedure(s) (LRB): TOTAL KNEE ARTHROPLASTY (Right) Up with therapy Plan for discharge tomorrow Discharge to SNF dvt proph lovenox Pain control as needed Appreciate medicine assistance  Margart Sickles 02/04/2013, 12:02 PM

## 2013-02-04 NOTE — Progress Notes (Signed)
Occupational Therapy Treatment Patient Details Name: Eric [name] MRN: [id number] DOB: 09-08-1946 Today's Date: 02/04/2013 Time: 1191-4782 OT Time Calculation (min): 23 min  OT Assessment / Plan / Recommendation Comments on Treatment Session Pt making good progress. S with transfers. Pt has decided to go to rehab at [facility] - Plans to D/C to [facility] per pt and Dr. [name]. All further OT to be addressed at SNF.    Follow Up Recommendations  SNF  - per pt/Dr. [name].     Barriers to Discharge       Equipment Recommendations       Recommendations for Other Services  none  Frequency Min 2X/week   Plan Discharge plan needs to be updated    Precautions / Restrictions Precautions Precautions: None Required Braces or Orthoses: Knee Immobilizer - Right Knee Immobilizer - Right: On when out of bed or walking Restrictions RLE Weight Bearing: Weight bearing as tolerated   Pertinent Vitals/Pain 2. knee    ADL  Tub/Shower Transfer: Min guard Tub/Shower Transfer Method: Science writer: Other (comment);Walk in shower (3 in 1) Equipment Used: Gait belt;Rolling walker Transfers/Ambulation Related to ADLs: S ADL Comments: Educated wife on transfer technique    OT Diagnosis:    OT Problem List:   OT Treatment Interventions:     OT Goals Acute Rehab OT Goals OT Goal Formulation: With patient Time For Goal Achievement: 02/17/13 Potential to Achieve Goals: Good ADL Goals Pt Will Perform Tub/Shower Transfer: Shower transfer;with caregiver independent in assisting;Ambulation;with DME;with supervision ADL Goal: Tub/Shower Transfer - Progress: Progressing toward goals Additional ADL Goal #1: pt/wife will verbalize understaning of compensatory techniques for ADL.AE to increase independence ADL Goal: Additional Goal #1 - Progress: Progressing toward goals  Visit Information  Last OT Received On: 02/04/13 Assistance Needed: +1    Subjective Data       Prior Functioning       Cognition  Cognition Overall Cognitive Status: Appears within functional limits for tasks assessed/performed Arousal/Alertness: Awake/alert Orientation Level: Appears intact for tasks assessed Behavior During Session: [facility] for tasks performed    Mobility  Bed Mobility Bed Mobility: Sit to Supine Sit to Supine: 4: Min assist Details for Bed Mobility Assistance: to lift RLE back onto bed Transfers Transfers: Sit to Stand;Stand to Sit Sit to Stand: 5: Supervision;From chair/3-in-1 Stand to Sit: 5: Supervision;To bed Details for Transfer Assistance: no cues or assistance needed with transfers    Exercises     Balance  S   End of Session OT - End of Session Equipment Utilized During Treatment: Gait belt Activity Tolerance: Patient tolerated treatment well Patient left: in bed;with call bell/phone within reach;with family/visitor present Nurse Communication: Mobility status  GO     Winnie Umali,HILLARY 02/04/2013, 12:57 PM [facility], OTR/L  [phone] 02/04/2013

## 2013-02-05 ENCOUNTER — Encounter (HOSPITAL_COMMUNITY): Payer: Self-pay | Admitting: Orthopedic Surgery

## 2013-02-05 LAB — TYPE AND SCREEN
ABO/RH(D): O POS
Antibody Screen: NEGATIVE

## 2013-02-05 MED ORDER — OXYCODONE HCL 5 MG PO TABS: ORAL_TABLET | ORAL | Status: DC

## 2013-02-05 MED ORDER — TRAMADOL HCL 50 MG PO TABS: ORAL_TABLET | ORAL | Status: DC

## 2013-02-05 MED ORDER — METHOCARBAMOL 500 MG PO TABS: 500.0000 mg | ORAL_TABLET | Freq: Four times a day (QID) | ORAL | Status: DC | PRN

## 2013-02-05 MED ORDER — HYDROCODONE-ACETAMINOPHEN 5-325 MG PO TABS: ORAL_TABLET | ORAL | Status: DC

## 2013-02-05 MED ORDER — ENOXAPARIN SODIUM 30 MG/0.3ML ~~LOC~~ SOLN: 30.0000 mg | SUBCUTANEOUS | Status: DC

## 2013-02-05 NOTE — Progress Notes (Signed)
Subjective: 3 Days Post-Op Procedure(s) (LRB): TOTAL KNEE ARTHROPLASTY (Right) Patient reports pain as mild.    Objective: Vital signs in last 24 hours: Temp:  [98 F (36.7 C)-98.2 F (36.8 C)] 98.2 F (36.8 C) (03/03 0725) Pulse Rate:  [89-103] 103 (03/03 0725) Resp:  [16-20] 18 (03/03 0725) BP: (124-139)/(52-71) 139/71 mmHg (03/03 0725) SpO2:  [97 %-99 %] 99 % (03/03 0725)  Intake/Output from previous day: 03/02 0701 - 03/03 0700 In: 1200 [P.O.:1200] Out: 1050 [Urine:1050] Intake/Output this shift:     Recent Labs  02/02/13 1409 02/02/13 1522 02/03/13 0635 02/04/13 0805  HGB 7.7* 7.0* 6.9* 8.4*    Recent Labs  02/03/13 0635 02/04/13 0805  WBC 8.8 13.0*  RBC 2.71* 3.23*  HCT 20.1* 24.7*  PLT 110* 77*    Recent Labs  02/02/13 1522 02/04/13 0805  NA 135 132*  K 4.4 4.1  CL 98 93*  CO2 25 29  BUN 21 9  CREATININE 0.84 0.80  GLUCOSE 188* 170*  CALCIUM 8.8 9.2   No results found for this basename: LABPT, INR,  in the last 72 hours  Neurovascular intact Sensation intact distally Intact pulses distally Dorsiflexion/Plantar flexion intact Incision: dressing C/D/I  Assessment/Plan: 3 Days Post-Op Procedure(s) (LRB): TOTAL KNEE ARTHROPLASTY (Right) Discharge to SNF  [place], Joshua 02/05/2013, 8:38 AM

## 2013-02-05 NOTE — Discharge Summary (Signed)
PATIENT ID: Eric [name]        MRN:  [id number]          DOB/AGE: Oct 25, 1946 / 67 y.o.    DISCHARGE SUMMARY  ADMISSION DATE:    02/02/2013 DISCHARGE DATE:   02/05/2013   ADMISSION DIAGNOSIS: OA RIGHT KNEE    DISCHARGE DIAGNOSIS:  OA RIGHT KNEE    ADDITIONAL DIAGNOSIS: Principal Problem:   Osteoarthritis of right knee Active Problems:   MDS (myelodysplastic syndrome), low grade   Rheumatoid arthritis   Anemia   Thrombocytopenia, unspecified   HTN (hypertension)  Past Medical History  Diagnosis Date  . MDS (myelodysplastic syndrome), low grade 2012    on observation  . Rheumatoid arthritis     was on MTX until MDS dx  . Hypertension   . Anemia   . Family history of ischemic heart disease   . Shortness of breath     exertional  . Dysrhythmia   . Tachycardia 2013    evaluated by Dr Eldridge Dace @ [place]  . Gout     PROCEDURE: Procedure(s): TOTAL KNEE ARTHROPLASTY [place] 02/02/2013  CONSULTS: medicine, PT, OT  Treatment Team:  Alison Murray, MD   HISTORY:  See H&P in chart  HOSPITAL COURSE:  Eric [name] is a 67 y.o. admitted on 02/02/2013 and found to have a diagnosis of OA RIGHT KNEE.  After appropriate laboratory studies were obtained  they were taken to the operating room on 02/02/2013 and underwent  Procedure(s): TOTAL KNEE ARTHROPLASTY Right.   They were given perioperative antibiotics:  Anti-infectives   Start     Dose/Rate Route Frequency Ordered Stop   02/02/13 1400  ceFAZolin (ANCEF) IVPB 1 g/50 mL premix     1 g 100 mL/hr over 30 Minutes Intravenous Every 6 hours 02/02/13 1143 02/03/13 0159   02/02/13 0600  ceFAZolin (ANCEF) IVPB 2 g/50 mL premix     2 g 100 mL/hr over 30 Minutes Intravenous On call to O.R. 02/01/13 1425 02/02/13 0750    .  Tolerated the procedure well.  Placed with a foley intraoperatively.  Given Ofirmev at induction and for 24 hours.     POD #1, allowed out of bed to a chair.  PT for ambulation and exercise program.  Foley  D/C'd in morning.  IV saline locked.  O2 discontionued.  POD #2, continued PT and ambulation.   Hemovac pulled. .  The remainder of the hospital course was dedicated to ambulation and strengthening.   The patient was discharged on 3 Days Post-Op in  Stable condition.  Blood products given 2 units PRBC  DIAGNOSTIC STUDIES: Recent vital signs: Patient Vitals for the past 24 hrs:  BP Temp Temp src Pulse Resp SpO2  02/05/13 0725 139/71 mmHg 98.2 F (36.8 C) Oral 103 18 99 %  02/04/13 2105 124/52 mmHg 98 F (36.7 C) Oral 103 18 97 %  02/04/13 1600 - - - - 16 -  02/04/13 1427 126/60 mmHg 98.2 F (36.8 C) Oral 89 20 99 %  02/04/13 1200 - - - - 18 -       Recent laboratory studies:  Recent Labs  01/31/13 0919 02/02/13 1409 02/02/13 1522 02/03/13 0635 02/04/13 0805  WBC 4.3 5.6 5.9 8.8 13.0*  HGB 8.4* 7.7* 7.0* 6.9* 8.4*  HCT 25.9* 23.2* 21.1* 20.1* 24.7*  PLT 75* 82* 81* 110* 77*    Recent Labs  02/02/13 1409 02/02/13 1522 02/04/13 0805  NA 137 135 132*  K  4.4 4.4 4.1  CL 101 98 93*  CO2 26 25 29   BUN 21 21 9   CREATININE 0.83 0.84 0.80  GLUCOSE 146* 188* 170*  CALCIUM 9.4 8.8 9.2   Lab Results  Component Value Date   INR 1.15 01/26/2013     Recent Radiographic Studies :  No results found.  DISCHARGE INSTRUCTIONS:  Future Appointments Provider Department Dept Phone   02/26/2013 9:00 AM [facility] [phone]   03/26/2013 9:00 AM [facility] Only [facility] [phone]   04/23/2013 9:00 AM [facility] Only [facility] [phone]   04/23/2013 9:30 AM [name], MD [facility] [phone]      DISCHARGE MEDICATIONS:     Medication List    STOP taking these medications       aspirin EC 81 MG tablet     colchicine 0.6 MG tablet      TAKE these medications       acetaminophen 500 MG tablet  Commonly known as:   TYLENOL  Take 1,000 mg by mouth every 6 (six) hours as needed for pain.     CALTRATE 600 PLUS-VIT D PO  Take 1 tablet by mouth 2 (two) times daily.     enoxaparin 30 MG/0.3ML injection  Commonly known as:  LOVENOX  Inject 0.3 mLs (30 mg total) into the skin daily.     HYDROcodone-acetaminophen 5-325 MG per tablet  Commonly known as:  NORCO  May take 1 or 2 tabs q4-6hrs prn breakthrough pain from his tramadol or Tylenol     lisinopril-hydrochlorothiazide 20-25 MG per tablet  Commonly known as:  PRINZIDE,ZESTORETIC  Take 0.5 tablets by mouth Daily.     loratadine 10 MG tablet  Commonly known as:  CLARITIN  Take 10 mg by mouth daily.     methocarbamol 500 MG tablet  Commonly known as:  ROBAXIN  Take 1 tablet (500 mg total) by mouth every 6 (six) hours as needed.     metoprolol 50 MG tablet  Commonly known as:  LOPRESSOR  Take 0.5 tablets (25 mg total) by mouth 2 (two) times daily.     omega-3 acid ethyl esters 1 G capsule  Commonly known as:  LOVAZA  Take 2 g by mouth 2 (two) times daily.     predniSONE 10 MG tablet  Commonly known as:  DELTASONE  Take 5 mg by mouth daily.     traMADol 50 MG tablet  Commonly known as:  ULTRAM  1-2 tabs po q4-6hrs prn pain        FOLLOW UP VISIT:       Follow-up Information   Schedule an appointment as soon as possible for a visit with CAFFREY JR,W D, MD. (to be seen on 02/15/13)    Contact information:   [address] [place] [address] [phone]       DISPOSITION:   Skilled Nursing Facility/Rehab  CONDITION:  Stable   Margart Sickles 02/05/2013, 8:49 AM

## 2013-02-05 NOTE — Progress Notes (Signed)
Physical Therapy Treatment Patient Details Name: Eric [name] MRN: [id number] DOB: 01/25/1946 Today's Date: 02/05/2013 Time: 1191-4782 PT Time Calculation (min): 14 min  PT Assessment / Plan / Recommendation Comments on Treatment Session  Pt presents with a R TKA. Pt progessing  and moving well today. Pt plans to D/C to SNF secondary to incident with wife falling last night. Pt to benefit from continued skilled therapy at SNF to address gait deficits, decrease ROM and decrease stregnth to help pt return to PLOF.     Follow Up Recommendations  SNF     Does the patient have the potential to tolerate intense rehabilitation     Barriers to Discharge        Equipment Recommendations  Rolling walker with 5" wheels (3-in1)    Recommendations for Other Services    Frequency 7X/week   Plan Discharge plan needs to be updated;Frequency remains appropriate    Precautions / Restrictions Precautions Precautions: Knee Required Braces or Orthoses: Knee Immobilizer - Right Knee Immobilizer - Right: On when out of bed or walking Restrictions Weight Bearing Restrictions: Yes RLE Weight Bearing: Weight bearing as tolerated   Pertinent Vitals/Pain 4/10 pain. Premedicated.     Mobility  Bed Mobility Bed Mobility: Not assessed Transfers Transfers: Sit to Stand;Stand to Sit Sit to Stand: With upper extremity assist;From chair/3-in-1;5: Supervision Stand to Sit: 6: Modified independent (Device/Increase time);With upper extremity assist;To chair/3-in-1 Details for Transfer Assistance: required min vc's for safety. Ambulation/Gait Ambulation/Gait Assistance: 5: Supervision Ambulation Distance (Feet): 150 Feet Assistive device: Rolling walker Ambulation/Gait Assistance Details: min vc's for gait sequencing and upright posture.  Gait Pattern: Step-to pattern;Antalgic;Lateral trunk lean to left;Decreased stance time - right;Decreased step length - left Stairs: Yes Stairs Assistance: 4: Min  guard Stairs Assistance Details (indicate cue type and reason): vc's for correct sequencing and safety with RW.  Stair Management Technique: Two rails;Step to pattern;Backwards;With walker Number of Stairs: 3 Wheelchair Mobility Wheelchair Mobility: No    Exercises     PT Diagnosis:    PT Problem List:   PT Treatment Interventions:     PT Goals Acute Rehab PT Goals PT Goal Formulation: With patient Time For Goal Achievement: 02/09/13 Potential to Achieve Goals: Good PT Goal: Ambulate - Progress: Progressing toward goal PT Goal: Up/Down Stairs - Progress: Progressing toward goal  Visit Information  Last PT Received On: 02/05/13 Assistance Needed: +1    Subjective Data  Subjective: Pt reports that he feels better today. hopes to d/c to SNF today. Reports wife had a fall, helping him to bathroom last night.    Cognition  Cognition Overall Cognitive Status: Appears within functional limits for tasks assessed/performed Arousal/Alertness: Awake/alert Orientation Level: Appears intact for tasks assessed Behavior During Session: [facility] for tasks performed    Balance  Balance Balance Assessed: No  End of Session PT - End of Session Equipment Utilized During Treatment: Gait belt;Right knee immobilizer Activity Tolerance: Patient tolerated treatment well Patient left: in chair;with call bell/phone within reach;with family/visitor present Nurse Communication: Mobility status CPM Right Knee CPM Right Knee: [address]   GP    [place], [place], [place] [phone]  Sunny Schlein, [place] [phone] 02/05/2013, 10:18 AM

## 2013-02-08 ENCOUNTER — Encounter (HOSPITAL_COMMUNITY): Payer: Self-pay | Admitting: Emergency Medicine

## 2013-02-08 ENCOUNTER — Inpatient Hospital Stay (HOSPITAL_COMMUNITY)
Admission: EM | Admit: 2013-02-08 | Discharge: 2013-02-09 | DRG: 812 | Disposition: A | Discharge status: Skilled Nursing Facility | Payer: Medicare Other | Attending: Internal Medicine | Admitting: Internal Medicine

## 2013-02-08 DIAGNOSIS — M1711 Unilateral primary osteoarthritis, right knee: Secondary | ICD-10-CM

## 2013-02-08 DIAGNOSIS — D649 Anemia, unspecified: Secondary | ICD-10-CM

## 2013-02-08 DIAGNOSIS — M069 Rheumatoid arthritis, unspecified: Secondary | ICD-10-CM | POA: Diagnosis present

## 2013-02-08 DIAGNOSIS — D696 Thrombocytopenia, unspecified: Secondary | ICD-10-CM | POA: Diagnosis present

## 2013-02-08 DIAGNOSIS — I10 Essential (primary) hypertension: Secondary | ICD-10-CM

## 2013-02-08 DIAGNOSIS — Z87891 Personal history of nicotine dependence: Secondary | ICD-10-CM

## 2013-02-08 DIAGNOSIS — Z79899 Other long term (current) drug therapy: Secondary | ICD-10-CM

## 2013-02-08 DIAGNOSIS — D62 Acute posthemorrhagic anemia: Principal | ICD-10-CM | POA: Diagnosis present

## 2013-02-08 DIAGNOSIS — D4620 Refractory anemia with excess of blasts, unspecified: Secondary | ICD-10-CM | POA: Diagnosis present

## 2013-02-08 DIAGNOSIS — Y831 Surgical operation with implant of artificial internal device as the cause of abnormal reaction of the patient, or of later complication, without mention of misadventure at the time of the procedure: Secondary | ICD-10-CM | POA: Diagnosis present

## 2013-02-08 DIAGNOSIS — R7309 Other abnormal glucose: Secondary | ICD-10-CM | POA: Diagnosis present

## 2013-02-08 DIAGNOSIS — L02419 Cutaneous abscess of limb, unspecified: Secondary | ICD-10-CM | POA: Diagnosis present

## 2013-02-08 DIAGNOSIS — D61818 Other pancytopenia: Secondary | ICD-10-CM | POA: Diagnosis present

## 2013-02-08 DIAGNOSIS — E871 Hypo-osmolality and hyponatremia: Secondary | ICD-10-CM | POA: Diagnosis present

## 2013-02-08 DIAGNOSIS — T8140XA Infection following a procedure, unspecified, initial encounter: Secondary | ICD-10-CM | POA: Diagnosis present

## 2013-02-08 LAB — CBC WITH DIFFERENTIAL/PLATELET
Basophils Absolute: 0 10*3/uL (ref 0.0–0.1)
Eosinophils Absolute: 0 10*3/uL (ref 0.0–0.7)
Eosinophils Relative: 0 % (ref 0–5)
Lymphs Abs: 0.2 10*3/uL — ABNORMAL LOW (ref 0.7–4.0)
MCH: 26.1 pg (ref 26.0–34.0)
MCV: 77.6 fL — ABNORMAL LOW (ref 78.0–100.0)
Monocytes Absolute: 0.6 10*3/uL (ref 0.1–1.0)
Neutrophils Relative %: 76 % (ref 43–77)
Platelets: 44 10*3/uL — ABNORMAL LOW (ref 150–400)
RBC: 2.41 MIL/uL — ABNORMAL LOW (ref 4.22–5.81)
RDW: 24.1 % — ABNORMAL HIGH (ref 11.5–15.5)
WBC: 3.4 10*3/uL — ABNORMAL LOW (ref 4.0–10.5)

## 2013-02-08 LAB — BASIC METABOLIC PANEL
BUN: 22 mg/dL (ref 6–23)
Calcium: 8.5 mg/dL (ref 8.4–10.5)
Creatinine, Ser: 1.02 mg/dL (ref 0.50–1.35)
GFR calc Af Amer: 86 mL/min — ABNORMAL LOW (ref >90)
GFR calc non Af Amer: 75 mL/min — ABNORMAL LOW (ref >90)

## 2013-02-08 LAB — PREPARE RBC (CROSSMATCH)

## 2013-02-08 LAB — GLUCOSE, CAPILLARY: Glucose-Capillary: 100 mg/dL — ABNORMAL HIGH (ref 70–99)

## 2013-02-08 MED ORDER — INSULIN ASPART 100 UNIT/ML ~~LOC~~ SOLN: 0.0000 [IU] | Freq: Three times a day (TID) | SUBCUTANEOUS | Status: DC

## 2013-02-08 MED ORDER — SODIUM CHLORIDE 0.9 % IV SOLN
Freq: Once | INTRAVENOUS | Status: AC
  Administered 2013-02-08: 21:00:00 via INTRAVENOUS

## 2013-02-08 MED ORDER — PREDNISONE 5 MG PO TABS
5.0000 mg | ORAL_TABLET | Freq: Every day | ORAL | Status: DC
  Administered 2013-02-09: 5 mg via ORAL
  Filled 2013-02-08: qty 1

## 2013-02-08 MED ORDER — SODIUM CHLORIDE 0.9 % IV SOLN: 250.0000 mL | INTRAVENOUS | Status: DC | PRN

## 2013-02-08 MED ORDER — TRAMADOL HCL 50 MG PO TABS
50.0000 mg | ORAL_TABLET | ORAL | Status: DC | PRN
  Administered 2013-02-09: 100 mg via ORAL
  Filled 2013-02-08: qty 2

## 2013-02-08 MED ORDER — PIPERACILLIN-TAZOBACTAM 3.375 G IVPB 30 MIN
3.3750 g | INTRAVENOUS | Status: AC
  Administered 2013-02-08: 3.375 g via INTRAVENOUS
  Filled 2013-02-08: qty 50

## 2013-02-08 MED ORDER — ACETAMINOPHEN 325 MG PO TABS
ORAL_TABLET | ORAL | Status: AC
  Filled 2013-02-08: qty 2

## 2013-02-08 MED ORDER — ACETAMINOPHEN 500 MG PO TABS
ORAL_TABLET | ORAL | Status: AC
  Filled 2013-02-08: qty 2

## 2013-02-08 MED ORDER — INSULIN ASPART 100 UNIT/ML ~~LOC~~ SOLN: 0.0000 [IU] | Freq: Every day | SUBCUTANEOUS | Status: DC

## 2013-02-08 MED ORDER — METHOCARBAMOL 500 MG PO TABS
500.0000 mg | ORAL_TABLET | Freq: Four times a day (QID) | ORAL | Status: DC | PRN
  Administered 2013-02-09: 500 mg via ORAL
  Filled 2013-02-08: qty 1

## 2013-02-08 MED ORDER — LORATADINE 10 MG PO TABS
10.0000 mg | ORAL_TABLET | Freq: Every day | ORAL | Status: DC
  Administered 2013-02-09: 10 mg via ORAL
  Filled 2013-02-08: qty 1

## 2013-02-08 MED ORDER — ACETAMINOPHEN 325 MG PO TABS
650.0000 mg | ORAL_TABLET | Freq: Once | ORAL | Status: AC
  Administered 2013-02-08: 650 mg via ORAL
  Filled 2013-02-08: qty 2

## 2013-02-08 MED ORDER — DIPHENHYDRAMINE HCL 25 MG PO CAPS
25.0000 mg | ORAL_CAPSULE | Freq: Once | ORAL | Status: AC
  Administered 2013-02-08: 25 mg via ORAL
  Filled 2013-02-08: qty 1

## 2013-02-08 MED ORDER — PIPERACILLIN-TAZOBACTAM 3.375 G IVPB
3.3750 g | Freq: Three times a day (TID) | INTRAVENOUS | Status: DC
  Administered 2013-02-09 (×2): 3.375 g via INTRAVENOUS
  Filled 2013-02-08 (×5): qty 50

## 2013-02-08 MED ORDER — SODIUM CHLORIDE 0.9 % IJ SOLN
3.0000 mL | Freq: Two times a day (BID) | INTRAMUSCULAR | Status: DC
  Administered 2013-02-09: 3 mL via INTRAVENOUS

## 2013-02-08 MED ORDER — VANCOMYCIN HCL IN DEXTROSE 1-5 GM/200ML-% IV SOLN
1000.0000 mg | Freq: Two times a day (BID) | INTRAVENOUS | Status: DC
  Administered 2013-02-08 – 2013-02-09 (×2): 1000 mg via INTRAVENOUS
  Filled 2013-02-08 (×4): qty 200

## 2013-02-08 MED ORDER — ONDANSETRON HCL 4 MG PO TABS: 4.0000 mg | ORAL_TABLET | Freq: Four times a day (QID) | ORAL | Status: DC | PRN

## 2013-02-08 MED ORDER — ACETAMINOPHEN 500 MG PO TABS
1000.0000 mg | ORAL_TABLET | Freq: Four times a day (QID) | ORAL | Status: DC | PRN
  Administered 2013-02-08: 1000 mg via ORAL
  Filled 2013-02-08: qty 1

## 2013-02-08 MED ORDER — METOPROLOL TARTRATE 25 MG PO TABS
25.0000 mg | ORAL_TABLET | Freq: Two times a day (BID) | ORAL | Status: DC
  Administered 2013-02-09 (×2): 25 mg via ORAL
  Filled 2013-02-08 (×3): qty 1

## 2013-02-08 MED ORDER — SODIUM CHLORIDE 0.9 % IJ SOLN: 3.0000 mL | INTRAMUSCULAR | Status: DC | PRN

## 2013-02-08 MED ORDER — ONDANSETRON HCL 4 MG/2ML IJ SOLN: 4.0000 mg | Freq: Four times a day (QID) | INTRAMUSCULAR | Status: DC | PRN

## 2013-02-08 NOTE — ED Notes (Signed)
Pt presenting to ed with c/o low hemoglobin pt from [facility]. Pt is in [facility] for  rehab for his total right knee replacement. Pt has no other complaints at this time

## 2013-02-08 NOTE — ED Notes (Signed)
Admitting md at bedside

## 2013-02-08 NOTE — ED Provider Notes (Signed)
Medical screening examination/treatment/procedure(s) were conducted as a shared visit with non-physician practitioner(s) and myself.  I personally evaluated the patient during the encounter.  Symptomatic anemia, will transfuse.  Derwood Kaplan, MD 02/08/13 2259

## 2013-02-08 NOTE — H&P (Signed)
[facility] History and Physical  Eric [name] EXB:[id number] DOB: Nov 13, 1946 DOA: 02/08/2013  Referring physician: er PCP: [name], MD  Specialists: [name]  Chief Complaint: low Hgb at SNF  HPI: Eric [name] is a 67 y.o. male  Who had a recent knee replacement.  He had his Hgb checked at the SNF and they found it to be low.  Patient does endorse some SOB.  No fever, no chills.  No blood in stools but no recent BM.  Last transfusion was 2/28.  He sees [name] every 6 months for MDS- last note from [name] states:  Stable CBC. No clear evidence of progression of his MDS to a higher risk category. He does have slight anemia; but it is not severe enough for pRBC transfusion. I again recommended watchful observation. In the future, if he develops worsened cytopenia (ANC <1; Hgb <8; or plt <50K), we may consider repeating bone marrow biopsy to ensure that his MDS has not progressed. No chemotherapy (hypomethylating agent) is indicated at this time given only slight anemia. His baseline is Hb around 8.5, baseline platelet count around 80   In the ER, The PA spoke with the on call Hematologist [facility]) who recommended patient be obs for a transfusion.    He also recently was started on ABX for ? Cellulitis around where his incision from the knee replacement is   Review of Systems: all systems reviewed, negative unless stated above    Past Medical History  Diagnosis Date  . MDS (myelodysplastic syndrome), low grade 2012    on observation  . Rheumatoid arthritis     was on MTX until MDS dx  . Hypertension   . Anemia   . Family history of ischemic heart disease   . Shortness of breath     exertional  . Dysrhythmia   . Tachycardia 2013    evaluated by Dr Eldridge Dace @ [place]  . Gout    Past Surgical History  Procedure Laterality Date  . Knee arthroscopy Left   . Hernia repair    . Umbilical hernia repair    . Circumcision    . Total knee arthroplasty Right 02/02/2013   Procedure: TOTAL KNEE ARTHROPLASTY;  Surgeon: Thera Flake., MD;  Location: [facility] OR;  Service: Orthopedics;  Laterality: Right;  RIGHT ARTHROPLASTY KNEE MEDIAL/LATERAL COMPARTMENTS WITH PATELLA RESURFACING ANESTHESIA: GENERAL, PRE/POST OP FEMORAL NERVE   Social History:  reports that he quit smoking about 43 years ago. His smoking use included Cigarettes. He smoked 0.00 packs per day. He has never used smokeless tobacco. He reports that he does not drink alcohol or use illicit drugs.  No Known Allergies  Family Hx: CAD  Prior to Admission medications   Medication Sig Start Date End Date Taking? Authorizing Provider  acetaminophen (TYLENOL) 500 MG tablet Take 1,000 mg by mouth every 6 (six) hours as needed for pain.   Yes Historical Provider, MD  Calcium-Vitamin D (CALTRATE 600 PLUS-VIT D PO) Take 1 tablet by mouth 2 (two) times daily.    Yes Historical Provider, MD  enoxaparin (LOVENOX) 30 MG/0.3ML injection Inject 0.3 mLs (30 mg total) into the skin daily. 02/05/13  Yes Joshua Chadwell, PA-C  lisinopril-hydrochlorothiazide (PRINZIDE,ZESTORETIC) 20-25 MG per tablet Take 0.5 tablets by mouth daily.  09/20/12  Yes Historical Provider, MD  loratadine (CLARITIN) 10 MG tablet Take 10 mg by mouth daily.   Yes Historical Provider, MD  methocarbamol (ROBAXIN) 500 MG tablet Take 500 mg by  mouth every 6 (six) hours as needed (for muscle pain.).   Yes Historical Provider, MD  metoprolol (LOPRESSOR) 50 MG tablet Take 0.5 tablets (25 mg total) by mouth 2 (two) times daily. 11/08/12  Yes Margie Billet, MD  omega-3 acid ethyl esters (LOVAZA) 1 G capsule Take 2 g by mouth 2 (two) times daily.   Yes Historical Provider, MD  predniSONE (DELTASONE) 10 MG tablet Take 5 mg by mouth daily.    Yes Historical Provider, MD  traMADol (ULTRAM) 50 MG tablet Take 50-100 mg by mouth every 4 (four) hours as needed for pain.   Yes Historical Provider, MD   Physical Exam: Filed Vitals:   02/08/13 1501 02/08/13 1758  BP: 118/55  122/60  Pulse: 79 77  Temp: 98.4 F (36.9 C)   TempSrc: Oral   Resp: 20 20  SpO2: 99% 96%     General:  A+Ox3, NAD  Eyes: wnl  ENT: wnl  Neck: supple  Cardiovascular: rrr  Respiratory: clear anterior  Abdomen: +BS, soft, NT  Skin: no rashes or lesion  Musculoskeletal: moves all 4 extremities  Psychiatric: no SI/no HI  Neurologic: CN 2-12 intact  Labs on Admission:  Basic Metabolic Panel:  Recent Labs Lab 02/02/13 1409 02/02/13 1522 02/04/13 0805 02/08/13 1513  NA 137 135 132* 129*  K 4.4 4.4 4.1 4.6  CL 101 98 93* 92*  CO2 26 25 29 26   GLUCOSE 146* 188* 170* 171*  BUN 21 21 9 22   CREATININE 0.83 0.84 0.80 1.02  CALCIUM 9.4 8.8 9.2 8.5   Liver Function Tests: No results found for this basename: AST, ALT, ALKPHOS, BILITOT, PROT, ALBUMIN,  in the last 168 hours No results found for this basename: LIPASE, AMYLASE,  in the last 168 hours No results found for this basename: AMMONIA,  in the last 168 hours CBC:  Recent Labs Lab 02/02/13 1409 02/02/13 1522 02/03/13 0635 02/04/13 0805 02/08/13 1513  WBC 5.6 5.9 8.8 13.0* 3.4*  NEUTROABS  --   --   --   --  2.6  HGB 7.7* 7.0* 6.9* 8.4* 6.3*  HCT 23.2* 21.1* 20.1* 24.7* 18.7*  MCV 74.4* 74.0* 74.2* 76.5* 77.6*  PLT 82* 81* 110* 77* 44*   Cardiac Enzymes: No results found for this basename: CKTOTAL, CKMB, CKMBINDEX, TROPONINI,  in the last 168 hours  BNP (last 3 results) No results found for this basename: PROBNP,  in the last 8760 hours CBG:  Recent Labs Lab 02/05/13 1102  GLUCAP 121*    Radiological Exams on Admission: No results found.    Assessment/Plan Active Problems:   MDS (myelodysplastic syndrome), low grade   Anemia   Thrombocytopenia, unspecified   Hyponatremia   1. MDS- will need to consult [name] in the AM to see if further work up is required- added him to consult list- transfuse 2 units PRBCs, hold lovenox as plts low- TED/SCDs and ambulation 2. Recent knee infection  with cellulitis- vanc/zosyn while in hospital- change back to PO abx when goes back to rehab 3. Hyponatremia- hold HCTZ 4. Hyperglycemia- SSI and monitor, check HgA1C    Code Status: full Family Communication: wife at bedside Disposition Plan: hope for d/c in AM  Time spent: 70 min  VANN, JESSICA [facility] Pager [phone]  If 7PM-7AM, please contact night-coverage [email or web address] Password [facility] 02/08/2013, 6:15 PM

## 2013-02-08 NOTE — ED Provider Notes (Signed)
History     CSN: [id number]  Arrival date & time 02/08/13  1450   First MD Initiated Contact with Patient 02/08/13 1503      Chief Complaint  Patient presents with  . Anemia    (Consider location/radiation/quality/duration/timing/severity/associated sxs/prior treatment) The history is provided by the patient, medical records, the spouse and a relative. No language interpreter was used.    Eric [name] is a 67 y.o. male  with a hx of MDS, RA, HTN, anemia, tachycardia, gout presents to the Emergency Department complaining of gradual, persistent, progressively worsening fatigue, SOB onset yesterday.  Pt was given 2U blood prior to surgery on 02/02/13 but was found to be anemic at [facility] (rehab) today. Associated symptoms include fatigue, weakness, SOB, fever (102.9 this am), chills.  nothing makes it better and nothing makes it worse.  Pt denies neck pain, back pain, chest pain, abdominal pain, nausea, vomiting, diarrhea.      Past Medical History  Diagnosis Date  . MDS (myelodysplastic syndrome), low grade 2012    on observation  . Rheumatoid arthritis     was on MTX until MDS dx  . Hypertension   . Anemia   . Family history of ischemic heart disease   . Shortness of breath     exertional  . Dysrhythmia   . Tachycardia 2013    evaluated by Dr Eldridge Dace @ [place]  . Gout     Past Surgical History  Procedure Laterality Date  . Knee arthroscopy Left   . Hernia repair    . Umbilical hernia repair    . Circumcision    . Total knee arthroplasty Right 02/02/2013    Procedure: TOTAL KNEE ARTHROPLASTY;  Surgeon: Thera Flake., MD;  Location: [facility] OR;  Service: Orthopedics;  Laterality: Right;  RIGHT ARTHROPLASTY KNEE MEDIAL/LATERAL COMPARTMENTS WITH PATELLA RESURFACING ANESTHESIA: GENERAL, PRE/POST OP FEMORAL NERVE    History reviewed. No pertinent family history.  History  Substance Use Topics  . Smoking status: Former Smoker    Types: Cigarettes    Quit date:  01/26/1970  . Smokeless tobacco: Never Used  . Alcohol Use: No      Review of Systems  Constitutional: Positive for fatigue. Negative for fever, diaphoresis, appetite change and unexpected weight change.  HENT: Negative for mouth sores and neck stiffness.   Eyes: Negative for visual disturbance.  Respiratory: Positive for shortness of breath. Negative for cough, chest tightness and wheezing.   Cardiovascular: Negative for chest pain.  Gastrointestinal: Negative for nausea, vomiting, abdominal pain, diarrhea and constipation.  Endocrine: Negative for polydipsia, polyphagia and polyuria.  Genitourinary: Negative for dysuria, urgency, frequency and hematuria.  Musculoskeletal: Negative for back pain.  Skin: Positive for pallor. Negative for rash.  Allergic/Immunologic: Negative for immunocompromised state.  Neurological: Negative for syncope, light-headedness and headaches.  Hematological: Does not bruise/bleed easily.  Psychiatric/Behavioral: Negative for sleep disturbance. The patient is not nervous/anxious.   All other systems reviewed and are negative.    Allergies  Review of patient's allergies indicates no known allergies.  Home Medications   Current Outpatient Rx  Name  Route  Sig  Dispense  Refill  . acetaminophen (TYLENOL) 500 MG tablet   Oral   Take 1,000 mg by mouth every 6 (six) hours as needed for pain.         . Calcium-Vitamin D (CALTRATE 600 PLUS-VIT D PO)   Oral   Take 1 tablet by mouth 2 (two) times daily.          Marland Kitchen  enoxaparin (LOVENOX) 30 MG/0.3ML injection   Subcutaneous   Inject 0.3 mLs (30 mg total) into the skin daily.   12 Syringe   0   . lisinopril-hydrochlorothiazide (PRINZIDE,ZESTORETIC) 20-25 MG per tablet   Oral   Take 0.5 tablets by mouth daily.          Marland Kitchen loratadine (CLARITIN) 10 MG tablet   Oral   Take 10 mg by mouth daily.         . methocarbamol (ROBAXIN) 500 MG tablet   Oral   Take 500 mg by mouth every 6 (six) hours as  needed (for muscle pain.).         Marland Kitchen metoprolol (LOPRESSOR) 50 MG tablet   Oral   Take 0.5 tablets (25 mg total) by mouth 2 (two) times daily.   15 tablet   0   . omega-3 acid ethyl esters (LOVAZA) 1 G capsule   Oral   Take 2 g by mouth 2 (two) times daily.         . predniSONE (DELTASONE) 10 MG tablet   Oral   Take 5 mg by mouth daily.          . traMADol (ULTRAM) 50 MG tablet   Oral   Take 50-100 mg by mouth every 4 (four) hours as needed for pain.           BP 122/60  Pulse 77  Temp(Src) 98.4 F (36.9 C) (Oral)  Resp 20  SpO2 96%  Physical Exam  Nursing note and vitals reviewed. Constitutional: He is oriented to person, place, and time. He appears well-developed and well-nourished. No distress.  HENT:  Head: Normocephalic and atraumatic.  Mouth/Throat: Oropharynx is clear and moist. Mucous membranes are pale. No oropharyngeal exudate.  Eyes: Conjunctivae and EOM are normal. Pupils are equal, round, and reactive to light. No scleral icterus.  Neck: Normal range of motion. Neck supple.  Cardiovascular: Normal rate, regular rhythm, normal heart sounds and intact distal pulses.  Exam reveals no gallop and no friction rub.   No murmur heard. Pulmonary/Chest: Breath sounds normal. No accessory muscle usage. Not tachypneic. No respiratory distress. He has no decreased breath sounds. He has no wheezes. He has no rhonchi. He has no rales. He exhibits no tenderness.  Pt appears SOB, but denies dyspnea when questioned - pt not tachycardic  Abdominal: Soft. Bowel sounds are normal. He exhibits no mass. There is no tenderness. There is no rebound and no guarding.  Musculoskeletal: Normal range of motion. He exhibits no edema.  Well healing surgical incision to the R knee with mild erythema and without drainage.  Swelling to the R leg has not increased after surgery; no pitting edema  Lymphadenopathy:    He has no cervical adenopathy.  Neurological: He is alert and  oriented to person, place, and time. He has normal reflexes. No cranial nerve deficit. He exhibits normal muscle tone. Coordination normal.  Speech is clear and goal oriented Moves extremities without ataxia  Skin: Skin is warm and dry. No rash noted. He is not diaphoretic. No erythema. There is pallor.  Psychiatric: He has a normal mood and affect.    ED Course  Procedures (including critical care time)  Labs Reviewed  CBC WITH DIFFERENTIAL - Abnormal; Notable for the following:    WBC 3.4 (*)    RBC 2.41 (*)    Hemoglobin 6.3 (*)    HCT 18.7 (*)    MCV 77.6 (*)    RDW 24.1 (*)  Platelets 44 (*)    Lymphocytes Relative 6 (*)    Monocytes Relative 18 (*)    Lymphs Abs 0.2 (*)    All other components within normal limits  BASIC METABOLIC PANEL - Abnormal; Notable for the following:    Sodium 129 (*)    Chloride 92 (*)    Glucose, Bld 171 (*)    GFR calc non Af Amer 75 (*)    GFR calc Af Amer 86 (*)    All other components within normal limits  IRON AND TIBC  PREPARE RBC (CROSSMATCH)  TYPE AND SCREEN   No results found.   1. Anemia   2. Rheumatoid arthritis   3. MDS (myelodysplastic syndrome), low grade   4. Thrombocytopenia, unspecified       MDM  Eric [name] presents from [facility] with symptomatic anemia. Pt Hgb 6.3 today with Plts at 44.  Discussed with Heme/onc who recommends admission via [facility] for transfusion.  Pt SOB, but not tachycardic, hypoxic or with hemoptysis.  He does not report dyspnea.  Pt with recent surgery, but I doubt PE as pt also has fatigue and low platelets consistent with symptomatic anemia.    Discussed with Dr Clelia Croft in Oncology who recommends admission for transfusion.  Will admit to [facility].          Dahlia Client Muthersbaugh, PA-C 02/08/13 1810

## 2013-02-08 NOTE — ED Notes (Signed)
Contacted Dr. Julian Reil re pt's bed status d/t blood tx.  Changed bed assignment to tele.  Bed pending at this time.

## 2013-02-08 NOTE — ED Notes (Signed)
Contacted Dr. Julian Reil re pt's eleveted temp after first unit of PRBC transfused.  Okayed to administer tylenol and benadryl and requested to have floor coverage PA Lenny Pastel to be notified as well.

## 2013-02-08 NOTE — Progress Notes (Signed)
ANTIBIOTIC CONSULT NOTE - INITIAL  Pharmacy Consult for Vanco and Zosyn Indication: Cellulitis  No Known Allergies  Patient Measurements:    Vital Signs: Temp: 98.4 F (36.9 C) (03/06 1501) Temp src: Oral (03/06 1501) BP: 122/60 mmHg (03/06 1758) Pulse Rate: 77 (03/06 1758)  Labs:  Recent Labs  02/08/13 1513  WBC 3.4*  HGB 6.3*  PLT 44*  CREATININE 1.02   The CrCl is unknown because both a height and weight (above a minimum accepted value) are required for this calculation. No results found for this basename: VANCOTROUGH, VANCOPEAK, VANCORANDOM, GENTTROUGH, GENTPEAK, GENTRANDOM, TOBRATROUGH, TOBRAPEAK, TOBRARND, AMIKACINPEAK, AMIKACINTROU, AMIKACIN,  in the last 72 hours   Microbiology: No results found for this or any previous visit (from the past 720 hour(s)).  Medical History: Past Medical History  Diagnosis Date  . MDS (myelodysplastic syndrome), low grade 2012    on observation  . Rheumatoid arthritis     was on MTX until MDS dx  . Hypertension   . Anemia   . Family history of ischemic heart disease   . Shortness of breath     exertional  . Dysrhythmia   . Tachycardia 2013    evaluated by Dr Eldridge Dace @ [facility]  . Gout     Medications:  Anti-infectives   None     Assessment: 67 yo M presents to ER on 02/08/13 from SNF with low Hgb. Pt is s/p R TKA on 02/02/13. Pt was started on antibiotics as an outpatient (ceftriaxone and doxycycline) for possible cellulitis at his knee incision site. Plan now is to change to Vanco and Zosyn per Rx for cellulitis. Will use wt = 92.4kg on 01/26/13 and CrCl(n)~ 72.  Goal of Therapy:  Vancomycin trough level 10-15 mcg/ml  Plan:  1) Zosyn 3.375g IV q8h (4 hour infusion) - will send 1st dose to ER to be infused over 30 min. 2) Vanco 1g IV 12h 3) F/U updated ht/wt info  Darrol Angel, PharmD Pager: [phone] 02/08/2013,6:47 PM

## 2013-02-08 NOTE — ED Notes (Signed)
Tom C. PA returned page and is notified re pt's elevated temp.  Will administer tylenol and benadryl as ordered.

## 2013-02-09 DIAGNOSIS — D696 Thrombocytopenia, unspecified: Secondary | ICD-10-CM

## 2013-02-09 LAB — GLUCOSE, CAPILLARY
Glucose-Capillary: 107 mg/dL — ABNORMAL HIGH (ref 70–99)
Glucose-Capillary: 112 mg/dL — ABNORMAL HIGH (ref 70–99)

## 2013-02-09 LAB — IRON AND TIBC: UIBC: 131 ug/dL (ref 125–400)

## 2013-02-09 LAB — TYPE AND SCREEN
ABO/RH(D): O POS
Unit division: 0

## 2013-02-09 LAB — CBC
MCHC: 34.2 g/dL (ref 30.0–36.0)
RDW: 22.3 % — ABNORMAL HIGH (ref 11.5–15.5)

## 2013-02-09 LAB — BASIC METABOLIC PANEL
BUN: 22 mg/dL (ref 6–23)
Creatinine, Ser: 0.89 mg/dL (ref 0.50–1.35)
GFR calc Af Amer: >90 mL/min (ref >90)
GFR calc non Af Amer: 87 mL/min — ABNORMAL LOW (ref >90)
Potassium: 4.1 mEq/L (ref 3.5–5.1)

## 2013-02-09 MED ORDER — DIPHENHYDRAMINE HCL 25 MG PO CAPS: 25.0000 mg | ORAL_CAPSULE | Freq: Four times a day (QID) | ORAL | Status: DC | PRN

## 2013-02-09 MED ORDER — COLLAGENASE 250 UNIT/GM EX OINT
TOPICAL_OINTMENT | Freq: Every day | CUTANEOUS | Status: DC
Start: 2013-02-09 — End: 2013-02-09
  Filled 2013-02-09: qty 30

## 2013-02-09 MED ORDER — COLLAGENASE 250 UNIT/GM EX OINT: TOPICAL_OINTMENT | Freq: Every day | CUTANEOUS | Status: DC | PRN

## 2013-02-09 NOTE — Discharge Summary (Signed)
Physician Discharge Summary  Eric OBRYANT MVH:[id number] DOB: [birth year] DOA: 02/08/2013  PCP: [name], MD  Admit date: 02/08/2013 Discharge date: 02/09/2013  Recommendations for Outpatient Follow-up:  1. Pt will need to follow up with PCP in 2-3 weeks post discharge 2. Please obtain BMP to evaluate electrolytes and kidney function 3. Please also check CBC to evaluate Hg and Hct levels 4. Please note that pt was discharged on Doxycycline 100 mg BID to complete the therapy for 10 more days post discharge 5. Pt has received Zosyn and Vancomycin while in the hospital 6. Per oncologist [name] recommendations, despite thrombocytopenia, he is still at risk of thrombosis due to recent knee surgery, recommendation was to resume pharmacologic prophylaxis.   Discharge Diagnoses: Acute blood loss anemia in the setting of MDS Active Problems:   MDS (myelodysplastic syndrome), low grade   Anemia   Thrombocytopenia, unspecified   Hyponatremia  Discharge Condition: Stable  Diet recommendation: Heart healthy diet discussed in details   History of present illness:  Pt is 67 y.o. male who had a recent knee replacement, had his Hgb checked at the SNF and they found it to be low. Patient reported some SOB but no fever, no chills, no blood in stools. Last transfusion was 2/28. He sees [name] every 6 months for MDS- last note from [name] states: Stable CBC.   In the ER, The PA spoke with the on call Hematologist (Dr. Clelia Croft) who recommended patient be observation for a ? Need of transfusion.   Hospital Course:   1. MDS - pt is now status post 2 units of PRBC transfusion with appropriate rise in Hg post transfusion, continue Lovenox for DVT as per [name] 2. Recent knee infection with cellulitis - vanc/zosyn while in hospital, will change back to Doxycycline with recommended 10 days regimen post discharge  3. Hyponatremia - stable 4. Hyperglycemia - reasonable inpatient control    Procedures/Studies:  None  Consultations:  Oncology  Orthopedics  Antibiotics:  Received 24 hours of Vancomycin and Zosyn, transitioned to oral Doxycycline to complete the therapy for 10 more days   Discharge Exam: Filed Vitals:   02/09/13 0429  BP: 117/55  Pulse: 68  Temp: 98.7 F (37.1 C)  Resp: 20   Filed Vitals:   02/08/13 2345 02/09/13 0000 02/09/13 0021 02/09/13 0429  BP: 125/54 122/53 121/55 117/55  Pulse: 70 68 66 68  Temp: 98.2 F (36.8 C) 98.7 F (37.1 C) 98.1 F (36.7 C) 98.7 F (37.1 C)  TempSrc: Oral Oral Oral Oral  Resp: 20 19 19 20   Height: 5' 8.5" (1.74 m)     Weight: 202 lb 9.6 oz (91.9 kg)     SpO2: 99%   100%    General: Pt is alert, follows commands appropriately, not in acute distress Cardiovascular: Regular rate and rhythm, S1/S2 +, no murmurs, no rubs, no gallops Respiratory: Clear to auscultation bilaterally, no wheezing, no crackles, no rhonchi Abdominal: Soft, non tender, non distended, bowel sounds +, no guarding Extremities: no edema, no cyanosis, pulses palpable bilaterally DP and PT Neuro: Grossly nonfocal  Discharge Instructions  Discharge Orders   Future Appointments Emily Forse Department Dept Phone   02/26/2013 9:00 AM [facility] [place] [facility] [phone]   03/26/2013 9:00 AM [facility] [place] [facility] [phone]   04/23/2013 9:00 AM [facility] [place] [facility] [phone]   04/23/2013 9:30 AM [name],  [name] [facility] [phone]   Future Orders Complete By Expires     Diet - low sodium heart healthy  As directed     Increase activity slowly  As directed         Medication List    STOP taking these medications       ROCEPHIN 1 G injection  Generic drug:  cefTRIAXone      TAKE these medications       acetaminophen 500 MG tablet  Commonly known as:  TYLENOL  Take 1,000 mg by mouth  every 6 (six) hours as needed for pain.     CALTRATE 600 PLUS-VIT D PO  Take 1 tablet by mouth 2 (two) times daily.     collagenase ointment  Commonly known as:  SANTYL  Apply topically daily as needed. Apply to the affected area     diphenhydrAMINE 25 mg capsule  Commonly known as:  BENADRYL  Take 1 capsule (25 mg total) by mouth every 6 (six) hours as needed for itching or allergies.     doxycycline 100 MG tablet  Commonly known as:  VIBRA-TABS  Take 100 mg by mouth 2 (two) times daily. 10 day course of therapy started 02/08/13.     enoxaparin 30 MG/0.3ML injection  Commonly known as:  LOVENOX  Inject 0.3 mLs (30 mg total) into the skin daily.     lisinopril-hydrochlorothiazide 20-25 MG per tablet  Commonly known as:  PRINZIDE,ZESTORETIC  Take 0.5 tablets by mouth daily.     loratadine 10 MG tablet  Commonly known as:  CLARITIN  Take 10 mg by mouth daily.     methocarbamol 500 MG tablet  Commonly known as:  ROBAXIN  Take 500 mg by mouth every 6 (six) hours as needed (for muscle pain.).     metoprolol 50 MG tablet  Commonly known as:  LOPRESSOR  Take 0.5 tablets (25 mg total) by mouth 2 (two) times daily.     omega-3 acid ethyl esters 1 G capsule  Commonly known as:  LOVAZA  Take 2 g by mouth 2 (two) times daily.     predniSONE 10 MG tablet  Commonly known as:  DELTASONE  Take 5 mg by mouth daily.     traMADol 50 MG tablet  Commonly known as:  ULTRAM  Take 50-100 mg by mouth every 4 (four) hours as needed for pain.           Follow-up Information   Follow up with [name], MD.   Contact information:   [address] [name] [address] [phone]       Follow up with [name], MD In 1 week.   Contact information:   [address] [place] [address] [phone]        The results of significant diagnostics from this hospitalization (including imaging, microbiology, ancillary and laboratory) are listed below for reference.      Microbiology: No results found for this or any previous visit (from the past 240 hour(s)).   Labs: Basic Metabolic Panel:  Recent Labs Lab 02/02/13 1409 02/02/13 1522 02/04/13 0805 02/08/13 1513 02/09/13 0423  NA 137 135 132* 129* 135  K 4.4 4.4 4.1 4.6 4.1  CL 101 98 93* 92* 97  CO2 26 25 29 26 28   GLUCOSE 146* 188* 170* 171* 101*  BUN 21 21 9 22 22   CREATININE 0.83 0.84 0.80 1.02 0.89  CALCIUM 9.4 8.8 9.2 8.5 8.9  Liver Function Tests:  Recent Labs Lab 02/09/13 0423  BILITOT 0.9   CBC:  Recent Labs Lab 02/02/13 1522 02/03/13 0635 02/04/13 0805 02/08/13 1513 02/09/13 0423  WBC 5.9 8.8 13.0* 3.4* 2.5*  NEUTROABS  --   --   --  2.6  --   HGB 7.0* 6.9* 8.4* 6.3* 7.6*  HCT 21.1* 20.1* 24.7* 18.7* 22.2*  MCV 74.0* 74.2* 76.5* 77.6* 78.4  PLT 81* 110* 77* 44* 56*   CBG:  Recent Labs Lab 02/05/13 1102 02/08/13 2131 02/09/13 0704 02/09/13 1144  GLUCAP 121* 100* 107* 112*     SIGNED: Time coordinating discharge: Over 30 minutes  Debbora Presto, MD  [facility] 02/09/2013, 1:54 PM Pager [phone]  If 7PM-7AM, please contact night-coverage [email or web address] Password [email or web address]

## 2013-02-09 NOTE — Progress Notes (Signed)
Subjective:    Pt s/p one week s/p right total knee replacement readmitted for low Hgb, hx of myeloproliferative disorder, also mention of some cellulitis surrounding incision  Patient reports pain as controlled with medicine   Objective: Vital signs in last 24 hours: Temp:  [98.1 F (36.7 C)-100 F (37.8 C)] 98.7 F (37.1 C) (03/07 0429) Pulse Rate:  [66-81] 68 (03/07 0429) Resp:  [19-28] 20 (03/07 0429) BP: (104-125)/(48-71) 117/55 mmHg (03/07 0429) SpO2:  [96 %-100 %] 100 % (03/07 0429) Weight:  [91.9 kg (202 lb 9.6 oz)] 91.9 kg (202 lb 9.6 oz) (03/06 2345)  Intake/Output from previous day: 03/06 0701 - 03/07 0700 In: 3772.5 [I.V.:2500; Blood:1272.5] Out: 300 [Urine:300] Intake/Output this shift: Total I/O In: 240 [P.O.:240] Out: -    Recent Labs  02/08/13 1513 02/09/13 0423  HGB 6.3* 7.6*    Recent Labs  02/08/13 1513 02/09/13 0423  WBC 3.4* 2.5*  RBC 2.41* 2.83*  HCT 18.7* 22.2*  PLT 44* 56*    Recent Labs  02/08/13 1513 02/09/13 0423  NA 129* 135  K 4.6 4.1  CL 92* 97  CO2 26 28  BUN 22 22  CREATININE 1.02 0.89  GLUCOSE 171* 101*  CALCIUM 8.5 8.9   No results found for this basename: LABPT, INR,  in the last 72 hours  Physical Exam: Mild cellulitis around incision RLE NVI Dorsiflexion, plantarflexion intact No calf pain with palpation   Assessment/Plan:     Mild cellulitis post op R TKA 1 wk out Low Hgb hx of Myleoproliferative disorder  Agree with medicines decision for IV abx while inpatient, would recommend po doxycycline upon d/c back to SNF or home. PT consult for ROM and mobility while inpatient. Happy to address any other concerns while inpatient.  Margart Sickles 02/09/2013, 12:38 PM

## 2013-02-09 NOTE — Consult Note (Signed)
[facility] INPATIENT PROGRESS NOTE  Name: Eric [name]      MRN: [id number]    Location: 1441/1441-01  Date: 02/09/2013 Time:9:51 AM   Subjective: Interval History:Eric [name] reported mild SOB and DOE.  He has gout and RA pain in the bilateral ankles (left worse than right). He had right knee replacement last Friday.  He has some mild increased in warmth of the right knee. He had low grade fever prior to admission.  He denied significant swelling of the right knee.  He denied visible bleeding anywhere. He denied headache, jaundice, mucositis. He was able to ambulate with a walker at the rehab.  He did not have pain on weight bearing of right knee but pain in bilateral ankles.   Objective: Vital signs in last 24 hours: Temp:  [98.1 F (36.7 C)-100 F (37.8 C)] 98.7 F (37.1 C) (03/07 0429) Pulse Rate:  [66-81] 68 (03/07 0429) Resp:  [19-28] 20 (03/07 0429) BP: (104-125)/(48-71) 117/55 mmHg (03/07 0429) SpO2:  [96 %-100 %] 100 % (03/07 0429) Weight:  [202 lb 9.6 oz (91.9 kg)] 202 lb 9.6 oz (91.9 kg) (03/06 2345)    Intake/Output from previous day: 03/06 0701 - 03/07 0700 In: 3772.5 [I.V.:2500] Out: 300 [Urine:300]    PHYSICAL EXAM: Gen: Well-nourished man, in no acute distress. Eyes: No scleral icterus or jaundice. ENT: There was no oropharyngeal lesions. Neck was supple without thyromegaly. Lymphatics: Negative for cervical, supraclavicular, axillary, or inguinal adenopathy.  Respiratory: Lungs were clear bilaterally without wheezing or crackles. Cardiovascular: normal heart rate and rhythm; S1/S2; without murmur, rubs, or gallop. There was no pedal edema. GI: Abdomen was soft, flat, nontender, nondistended, without organomegaly. Musculoskeletal exam: No spinal tenderness on palpation of vertebral spine. Right knee with staples.  There was slight increased in warmth and erythema of the wound.  There was no pain to palpation or purulent discharge.  There was moderate  pain to palpation of bilateral ankles. Skin exam was without ecchymosis, petechiae. Patient was alert and oriented. Attention was good. Language was appropriate. Mood was normal without depression. Speech was not pressured. Thought content was not tangential.       Studies/Results: Results for orders placed during the hospital encounter of 02/08/13 (from the past 48 hour(s))  CBC WITH DIFFERENTIAL     Status: Abnormal   Collection Time    02/08/13  3:13 PM      Result Value Range   WBC 3.4 (*) 4.0 - 10.5 K/uL   RBC 2.41 (*) 4.22 - 5.81 MIL/uL   Hemoglobin 6.3 (*) 13.0 - 17.0 g/dL   Comment: RESULT REPEATED AND VERIFIED     CRITICAL RESULT CALLED TO, READ BACK BY AND VERIFIED WITH:     HAMILTON,J. AT 1548 ON [id number] BY LOVE,T.   HCT 18.7 (*) 39.0 - 52.0 %   MCV 77.6 (*) 78.0 - 100.0 fL   MCH 26.1  26.0 - 34.0 pg   MCHC 33.7  30.0 - 36.0 g/dL   RDW 91.4 (*) 78.2 - 95.6 %   Platelets 44 (*) 150 - 400 K/uL   Comment: SPECIMEN CHECKED FOR CLOTS     REPEATED TO VERIFY   Neutrophils Relative 76  43 - 77 %   Lymphocytes Relative 6 (*) 12 - 46 %   Monocytes Relative 18 (*) 3 - 12 %   Eosinophils Relative 0  0 - 5 %   Basophils Relative 0  0 - 1 %   Neutro  Abs 2.6  1.7 - 7.7 K/uL   Lymphs Abs 0.2 (*) 0.7 - 4.0 K/uL   Monocytes Absolute 0.6  0.1 - 1.0 K/uL   Eosinophils Absolute 0.0  0.0 - 0.7 K/uL   Basophils Absolute 0.0  0.0 - 0.1 K/uL   RBC Morphology ROULEAUX     Comment: POLYCHROMASIA PRESENT   Smear Review PLATELET COUNT CONFIRMED BY SMEAR    BASIC METABOLIC PANEL     Status: Abnormal   Collection Time    02/08/13  3:13 PM      Result Value Range   Sodium 129 (*) 135 - 145 mEq/L   Potassium 4.6  3.5 - 5.1 mEq/L   Chloride 92 (*) 96 - 112 mEq/L   CO2 26  19 - 32 mEq/L   Glucose, Bld 171 (*) 70 - 99 mg/dL   BUN 22  6 - 23 mg/dL   Creatinine, Ser 0.45  0.50 - 1.35 mg/dL   Calcium 8.5  8.4 - 40.9 mg/dL   GFR calc non Af Amer 75 (*) >90 mL/min   GFR calc Af Amer 86 (*) >90  mL/min   Comment:            The eGFR has been calculated     using the CKD EPI equation.     This calculation has not been     validated in all clinical     situations.     eGFR's persistently     <90 mL/min signify     possible Chronic Kidney Disease.  IRON AND TIBC     Status: Abnormal   Collection Time    02/08/13  3:13 PM      Result Value Range   Iron <10 (*) 42 - 135 ug/dL   TIBC Not calculated due to Iron <10.  215 - 435 ug/dL   Saturation Ratios Not calculated due to Iron <10.  20 - 55 %   UIBC 131  125 - 400 ug/dL  TYPE AND SCREEN     Status: None   Collection Time    02/08/13  5:45 PM      Result Value Range   ABO/RH(D) O POS     Antibody Screen NEG     Sample Expiration 02/11/2013     Unit Number [id number]     Blood Component Type RED CELLS,LR     Unit division 00     Status of Unit ISSUED,FINAL     Transfusion Status OK TO TRANSFUSE     Crossmatch Result Compatible     Unit Number [id number]     Blood Component Type RED CELLS,LR     Unit division 00     Status of Unit ISSUED,FINAL     Transfusion Status OK TO TRANSFUSE     Crossmatch Result Compatible    PREPARE RBC (CROSSMATCH)     Status: None   Collection Time    02/08/13  6:00 PM      Result Value Range   Order Confirmation ORDER PROCESSED BY BLOOD BANK    GLUCOSE, CAPILLARY     Status: Abnormal   Collection Time    02/08/13  9:31 PM      Result Value Range   Glucose-Capillary 100 (*) 70 - 99 mg/dL  BASIC METABOLIC PANEL     Status: Abnormal   Collection Time    02/09/13  4:23 AM      Result Value Range   Sodium 135  135 - 145  mEq/L   Potassium 4.1  3.5 - 5.1 mEq/L   Chloride 97  96 - 112 mEq/L   CO2 28  19 - 32 mEq/L   Glucose, Bld 101 (*) 70 - 99 mg/dL   BUN 22  6 - 23 mg/dL   Creatinine, Ser 1.91  0.50 - 1.35 mg/dL   Calcium 8.9  8.4 - 47.8 mg/dL   GFR calc non Af Amer 87 (*) >90 mL/min   GFR calc Af Amer >90  >90 mL/min   Comment:            The eGFR has been calculated      using the CKD EPI equation.     This calculation has not been     validated in all clinical     situations.     eGFR's persistently     <90 mL/min signify     possible Chronic Kidney Disease.  CBC     Status: Abnormal   Collection Time    02/09/13  4:23 AM      Result Value Range   WBC 2.5 (*) 4.0 - 10.5 K/uL   RBC 2.83 (*) 4.22 - 5.81 MIL/uL   Hemoglobin 7.6 (*) 13.0 - 17.0 g/dL   Comment: POST TRANSFUSION SPECIMEN     DELTA CHECK NOTED   HCT 22.2 (*) 39.0 - 52.0 %   MCV 78.4  78.0 - 100.0 fL   MCH 26.9  26.0 - 34.0 pg   MCHC 34.2  30.0 - 36.0 g/dL   RDW 29.5 (*) 62.1 - 30.8 %   Platelets 56 (*) 150 - 400 K/uL   Comment: CONSISTENT WITH PREVIOUS RESULT  RETICULOCYTES     Status: Abnormal   Collection Time    02/09/13  4:23 AM      Result Value Range   Retic Ct Pct 0.6  0.4 - 3.1 %   RBC. 2.86 (*) 4.22 - 5.81 MIL/uL   Retic Count, Manual 17.2 (*) 19.0 - 186.0 K/uL  LACTATE DEHYDROGENASE     Status: None   Collection Time    02/09/13  4:23 AM      Result Value Range   LDH 210  94 - 250 U/L  BILIRUBIN, TOTAL     Status: None   Collection Time    02/09/13  4:23 AM      Result Value Range   Total Bilirubin 0.9  0.3 - 1.2 mg/dL  GLUCOSE, CAPILLARY     Status: Abnormal   Collection Time    02/09/13  7:04 AM      Result Value Range   Glucose-Capillary 107 (*) 70 - 99 mg/dL   Comment 1 Documented in Chart     Comment 2 Notify RN     No results found.   MEDICATIONS: reviewed     Assessment/Plan:   1.  MDS. Low grade on observation. 2.  Recent right knee replacement.  3.  Admission with worsening anemia and thrombocytopenia:   - No evidence of active bleeding (retic low; not high).  No evidence of hemolysis (normal LDH, tbili).  - Low blood count due to depressed bone marrow function due to MDS in the setting of recent surgery.  - Rec:  Follow CBC and transfuse for Hgb <7; or plt <10K or active bleeding. 4.  DVT prophylaxis:  Despite thrombocytopenia, he is still  at risk of thrombosis due to recent knee surgery. I would recommend to resume pharmacologic prophylaxis.    5.  Upon D/C from this admission, patient was instructed to call [facility] [phone] for close follow up appointment for outpatient transfusion prn.

## 2013-02-09 NOTE — Progress Notes (Signed)
Patient is set to return to [facility] today. Patient & wife at bedside agreeable, wife to transport to facility.   Clinical Social Work Department BRIEF PSYCHOSOCIAL ASSESSMENT 02/09/2013  Patient:  Eric [name], Eric [name]     Account Number:  [id number]     Admit date:  02/08/2013  Clinical Social Worker:  Orpah Greek  Date/Time:  02/09/2013 03:43 PM  Referred by:  Physician  Date Referred:  02/09/2013 Referred for  Other - See comment   Other Referral:   Admitted from: [facility] SNF   Interview type:  Patient Other interview type:   and wife at bedside    PSYCHOSOCIAL DATA Living Status:  FACILITY Admitted from facility:  [facility] Level of care:  Skilled Nursing Facility Primary support name:  Gurjit Loconte (daughter) h#: [phone] Primary support relationship to patient:  SPOUSE Degree of support available:   good    CURRENT CONCERNS Current Concerns  Post-Acute Placement   Other Concerns:    SOCIAL WORK ASSESSMENT / PLAN CSW spoke with patient re: discharge planning. Patient was admitted from [facility] and plans to return there to continue rehab stay.   Assessment/plan status:  Information/Referral to [facility] Other assessment/ plan:   Information/referral to community resources:   CSW confirmed with Paula Compton at [facility] that because patient was admitted for less than 24 hours - no FL2 is needed. CSW sent discharge summary to facility prior to discharge.    PATIENT'S/FAMILY'S RESPONSE TO PLAN OF CARE: Patient & wife are agreeable with plan to return to [facility] at discharge - wife to transport.        Unice Bailey, LCSW [facility] Clinical Social Worker cell #: [phone]

## 2013-02-15 ENCOUNTER — Other Ambulatory Visit: Payer: Self-pay | Admitting: Oncology

## 2013-02-15 ENCOUNTER — Telehealth: Payer: Self-pay | Admitting: Oncology

## 2013-02-15 NOTE — Telephone Encounter (Signed)
called Eric [name] place and verbally gv all appt until May est app

## 2013-02-16 ENCOUNTER — Telehealth: Payer: Self-pay

## 2013-02-16 ENCOUNTER — Other Ambulatory Visit (HOSPITAL_BASED_OUTPATIENT_CLINIC_OR_DEPARTMENT_OTHER): Payer: Medicare Other | Admitting: Lab

## 2013-02-16 ENCOUNTER — Encounter (HOSPITAL_COMMUNITY)
Admission: RE | Admit: 2013-02-16 | Discharge: 2013-02-16 | Disposition: A | Discharge status: Home / Self Care | Payer: Medicare Other | Source: Ambulatory Visit | Attending: Oncology | Admitting: Oncology

## 2013-02-16 DIAGNOSIS — D469 Myelodysplastic syndrome, unspecified: Secondary | ICD-10-CM | POA: Insufficient documentation

## 2013-02-16 LAB — CBC WITH DIFFERENTIAL/PLATELET
Basophils Absolute: 0 10*3/uL (ref 0.0–0.1)
EOS%: 0 % (ref 0.0–7.0)
HCT: 23.6 % — ABNORMAL LOW (ref 38.4–49.9)
HGB: 7.7 g/dL — ABNORMAL LOW (ref 13.0–17.1)
MONO#: 0.1 10*3/uL (ref 0.1–0.9)
NEUT#: 1.9 10*3/uL (ref 1.5–6.5)
NEUT%: 80.7 % — ABNORMAL HIGH (ref 39.0–75.0)
RDW: 21.9 % — ABNORMAL HIGH (ref 11.0–14.6)
WBC: 2.3 10*3/uL — ABNORMAL LOW (ref 4.0–10.3)
lymph#: 0.4 10*3/uL — ABNORMAL LOW (ref 0.9–3.3)

## 2013-02-16 NOTE — Telephone Encounter (Signed)
Message copied by Kallie Locks on Fri Feb 16, 2013  4:55 PM ------      Message from: [name] T      Created: Fri Feb 16, 2013  3:33 PM       Please inform patient that he does not need transfusion.  Thanks. ------

## 2013-02-19 ENCOUNTER — Other Ambulatory Visit (HOSPITAL_BASED_OUTPATIENT_CLINIC_OR_DEPARTMENT_OTHER): Payer: Medicare Other

## 2013-02-19 ENCOUNTER — Other Ambulatory Visit: Payer: Self-pay | Admitting: Oncology

## 2013-02-19 ENCOUNTER — Telehealth: Payer: Self-pay | Admitting: *Deleted

## 2013-02-19 LAB — CBC WITH DIFFERENTIAL/PLATELET
BASO%: 0.3 % (ref 0.0–2.0)
Eosinophils Absolute: 0 10*3/uL (ref 0.0–0.5)
HCT: 25.7 % — ABNORMAL LOW (ref 38.4–49.9)
LYMPH%: 31.2 % (ref 14.0–49.0)
MCHC: 32.7 g/dL (ref 32.0–36.0)
MONO#: 0.2 10*3/uL (ref 0.1–0.9)
NEUT#: 2.1 10*3/uL (ref 1.5–6.5)
NEUT%: 63.4 % (ref 39.0–75.0)
Platelets: 135 10*3/uL — ABNORMAL LOW (ref 140–400)
RBC: 3.27 10*6/uL — ABNORMAL LOW (ref 4.20–5.82)
WBC: 3.3 10*3/uL — ABNORMAL LOW (ref 4.0–10.3)
lymph#: 1 10*3/uL (ref 0.9–3.3)
nRBC: 0 % (ref 0–0)

## 2013-02-19 LAB — HOLD TUBE, BLOOD BANK

## 2013-02-19 NOTE — Telephone Encounter (Signed)
Spoke w/ pt and wife in lobby.  Gave copy of lab results and relayed Dr. [name] message below. They verbalized understanding and I also gave him new copy of schedule w/ weekly labs.

## 2013-02-19 NOTE — Telephone Encounter (Signed)
Message copied by Wende Mott on Mon Feb 19, 2013  2:37 PM ------      Message from: HA, Raliegh Ip T      Created: Mon Feb 19, 2013  2:09 PM       Please advise pt that his blood counts have slightly improved.  He has MDS; but blood counts were lowered immediately post op.  Now, they are better.  No need for blood transfusion.  Continue to observe. ------

## 2013-02-26 ENCOUNTER — Telehealth: Payer: Self-pay | Admitting: *Deleted

## 2013-02-26 ENCOUNTER — Other Ambulatory Visit (HOSPITAL_BASED_OUTPATIENT_CLINIC_OR_DEPARTMENT_OTHER): Payer: Medicare Other | Admitting: Lab

## 2013-02-26 DIAGNOSIS — D469 Myelodysplastic syndrome, unspecified: Secondary | ICD-10-CM

## 2013-02-26 DIAGNOSIS — D4620 Refractory anemia with excess of blasts, unspecified: Secondary | ICD-10-CM

## 2013-02-26 LAB — CBC WITH DIFFERENTIAL/PLATELET
Basophils Absolute: 0 10*3/uL (ref 0.0–0.1)
Eosinophils Absolute: 0 10*3/uL (ref 0.0–0.5)
HGB: 8 g/dL — ABNORMAL LOW (ref 13.0–17.1)
MONO#: 0.6 10*3/uL (ref 0.1–0.9)
NEUT#: 2.8 10*3/uL (ref 1.5–6.5)
RBC: 3.15 10*6/uL — ABNORMAL LOW (ref 4.20–5.82)
RDW: 22.7 % — ABNORMAL HIGH (ref 11.0–14.6)
WBC: 5.7 10*3/uL (ref 4.0–10.3)
nRBC: 0 % (ref 0–0)

## 2013-02-26 LAB — TECHNOLOGIST REVIEW

## 2013-02-26 NOTE — Telephone Encounter (Signed)
Spoke with patient's wife, labs stable may now come every other week, as opposed to weekly. Wife verbalized understanding.

## 2013-02-26 NOTE — Telephone Encounter (Signed)
Message copied by Reesa Chew on Mon Feb 26, 2013 12:35 PM ------      Message from: HA, Raliegh Ip T      Created: Mon Feb 26, 2013  9:29 AM       Please call pt.  His CBC is stable.  He can space out his lab check every 2 weeks now as opposed to weekly.  Thanks. ------

## 2013-03-05 ENCOUNTER — Other Ambulatory Visit: Payer: Medicare Other

## 2013-03-12 ENCOUNTER — Other Ambulatory Visit (HOSPITAL_BASED_OUTPATIENT_CLINIC_OR_DEPARTMENT_OTHER): Payer: Medicare Other | Admitting: Lab

## 2013-03-12 ENCOUNTER — Encounter: Payer: Self-pay | Admitting: Oncology

## 2013-03-12 DIAGNOSIS — D4620 Refractory anemia with excess of blasts, unspecified: Secondary | ICD-10-CM

## 2013-03-12 LAB — CBC WITH DIFFERENTIAL/PLATELET
Basophils Absolute: 0 10*3/uL (ref 0.0–0.1)
EOS%: 0 % (ref 0.0–7.0)
Eosinophils Absolute: 0 10*3/uL (ref 0.0–0.5)
HCT: 26.3 % — ABNORMAL LOW (ref 38.4–49.9)
HGB: 8.6 g/dL — ABNORMAL LOW (ref 13.0–17.1)
MONO#: 0.5 10*3/uL (ref 0.1–0.9)
NEUT#: 5.8 10*3/uL (ref 1.5–6.5)
RDW: 23.7 % — ABNORMAL HIGH (ref 11.0–14.6)
WBC: 7.6 10*3/uL (ref 4.0–10.3)
lymph#: 1.3 10*3/uL (ref 0.9–3.3)

## 2013-03-12 LAB — TECHNOLOGIST REVIEW

## 2013-03-19 ENCOUNTER — Other Ambulatory Visit: Payer: Self-pay | Admitting: *Deleted

## 2013-03-19 ENCOUNTER — Other Ambulatory Visit: Payer: Medicare Other

## 2013-03-26 ENCOUNTER — Encounter: Payer: Self-pay | Admitting: Oncology

## 2013-03-26 ENCOUNTER — Other Ambulatory Visit (HOSPITAL_BASED_OUTPATIENT_CLINIC_OR_DEPARTMENT_OTHER): Payer: Medicare Other

## 2013-03-26 ENCOUNTER — Encounter: Payer: Self-pay | Admitting: *Deleted

## 2013-03-26 DIAGNOSIS — D469 Myelodysplastic syndrome, unspecified: Secondary | ICD-10-CM

## 2013-03-26 DIAGNOSIS — D4620 Refractory anemia with excess of blasts, unspecified: Secondary | ICD-10-CM

## 2013-03-26 LAB — CBC WITH DIFFERENTIAL/PLATELET
BASO%: 0.5 % (ref 0.0–2.0)
Eosinophils Absolute: 0 10*3/uL (ref 0.0–0.5)
HCT: 24.3 % — ABNORMAL LOW (ref 38.4–49.9)
MCHC: 33.4 g/dL (ref 32.0–36.0)
MONO#: 0.7 10*3/uL (ref 0.1–0.9)
NEUT#: 3.3 10*3/uL (ref 1.5–6.5)
NEUT%: 59.1 % (ref 39.0–75.0)
WBC: 5.6 10*3/uL (ref 4.0–10.3)
lymph#: 1.5 10*3/uL (ref 0.9–3.3)

## 2013-03-26 LAB — TECHNOLOGIST REVIEW

## 2013-03-26 NOTE — Progress Notes (Signed)
S/w pt in lobby and gave lab results.  Informed that [name] has reviewed them and says they are stable.  Continue labs every 2 weeks and next f/u w/ [name] in May.  Pt verbalized understanding.

## 2013-03-27 ENCOUNTER — Telehealth: Payer: Self-pay | Admitting: Oncology

## 2013-04-02 ENCOUNTER — Other Ambulatory Visit: Payer: Medicare Other

## 2013-04-09 ENCOUNTER — Other Ambulatory Visit (HOSPITAL_BASED_OUTPATIENT_CLINIC_OR_DEPARTMENT_OTHER): Payer: Medicare Other | Admitting: Lab

## 2013-04-09 DIAGNOSIS — D4620 Refractory anemia with excess of blasts, unspecified: Secondary | ICD-10-CM

## 2013-04-09 LAB — COMPREHENSIVE METABOLIC PANEL (CC13)
ALT: 15 U/L (ref 0–55)
AST: 15 U/L (ref 5–34)
Albumin: 3.8 g/dL (ref 3.5–5.0)
BUN: 18.1 mg/dL (ref 7.0–26.0)
CO2: 26 mEq/L (ref 22–29)
Calcium: 9.2 mg/dL (ref 8.4–10.4)
Chloride: 102 mEq/L (ref 98–107)
Potassium: 4 mEq/L (ref 3.5–5.1)

## 2013-04-09 LAB — CBC WITH DIFFERENTIAL/PLATELET
BASO%: 0.3 % (ref 0.0–2.0)
EOS%: 0 % (ref 0.0–7.0)
MCH: 25.5 pg — ABNORMAL LOW (ref 27.2–33.4)
MCHC: 32.8 g/dL (ref 32.0–36.0)
RDW: 25.6 % — ABNORMAL HIGH (ref 11.0–14.6)
lymph#: 1 10*3/uL (ref 0.9–3.3)
nRBC: 1 % — ABNORMAL HIGH (ref 0–0)

## 2013-04-09 LAB — TECHNOLOGIST REVIEW

## 2013-04-09 LAB — CHCC SMEAR

## 2013-04-13 ENCOUNTER — Telehealth: Payer: Self-pay

## 2013-04-16 ENCOUNTER — Other Ambulatory Visit: Payer: Medicare Other

## 2013-04-19 ENCOUNTER — Ambulatory Visit (HOSPITAL_BASED_OUTPATIENT_CLINIC_OR_DEPARTMENT_OTHER): Payer: Medicare Other | Admitting: Oncology

## 2013-04-19 ENCOUNTER — Other Ambulatory Visit (HOSPITAL_BASED_OUTPATIENT_CLINIC_OR_DEPARTMENT_OTHER): Payer: Medicare Other | Admitting: Lab

## 2013-04-19 ENCOUNTER — Telehealth: Payer: Self-pay | Admitting: Oncology

## 2013-04-19 VITALS — BP 120/57 | HR 57 | Temp 97.7°F | Resp 18 | Ht 68.5 in | Wt 198.8 lb

## 2013-04-19 DIAGNOSIS — M069 Rheumatoid arthritis, unspecified: Secondary | ICD-10-CM

## 2013-04-19 DIAGNOSIS — R0602 Shortness of breath: Secondary | ICD-10-CM

## 2013-04-19 DIAGNOSIS — D4620 Refractory anemia with excess of blasts, unspecified: Secondary | ICD-10-CM

## 2013-04-19 DIAGNOSIS — D469 Myelodysplastic syndrome, unspecified: Secondary | ICD-10-CM

## 2013-04-19 NOTE — Progress Notes (Signed)
[name]  Telephone:(336) [phone] Fax:([phone]   OFFICE PROGRESS NOTE   Cc:  Eric Reeve, [name]  DIAGNOSIS:  MDS; low to intermediate 1 risk; normal cytogenetics and FISH panel for MDS.   CURRENT THERAPY: watchful observation.   INTERVAL HISTORY: Eric [name] 67 y.o. male returns for regular follow up with his wife.  Since last visit, he had retired from his [employer] job in March 2013.  He had knee replacement in 01/2013.  He had anemia and thrombocytopenia ever since. He is now retired.  He is still independent of activities of daily living.  He has been able to work in the garden for a few hours everyday planting vegetables.  He does have SOB and DOE.  He still has joint pain from RA which is stable for which he takes Prednisone.     Patient denies fever, anorexia, weight loss, fatigue, headache, visual changes, confusion, drenching night sweats, palpable lymph node swelling, mucositis, odynophagia, dysphagia, nausea vomiting, jaundice, chest pain, palpitation, productive cough, gum bleeding, epistaxis, hematemesis, hemoptysis, abdominal pain, abdominal swelling, early satiety, melena, hematochezia, hematuria, skin rash, spontaneous bleeding, heat or cold intolerance, bowel bladder incontinence, back pain, focal motor weakness, paresthesia, depression.      Past Medical History  Diagnosis Date  . MDS (myelodysplastic syndrome), low grade 2012    on observation  . Rheumatoid arthritis     was on MTX until MDS dx  . Hypertension   . Anemia   . Family history of ischemic heart disease   . Shortness of breath     exertional  . Dysrhythmia   . Tachycardia 2013    evaluated by Eric [name]  . Gout     Past Surgical History  Procedure Laterality Date  . Knee arthroscopy Left   . Hernia repair    . Umbilical hernia repair    . Circumcision    . Total knee arthroplasty Right 02/02/2013    Procedure: TOTAL KNEE ARTHROPLASTY;  Surgeon: Eric [name];  Location: [facility] OR;  Service: Orthopedics;  Laterality: Right;  RIGHT ARTHROPLASTY KNEE MEDIAL/LATERAL COMPARTMENTS WITH PATELLA RESURFACING ANESTHESIA: GENERAL, PRE/POST OP FEMORAL NERVE    Current Outpatient Prescriptions  Medication Sig Dispense Refill  . acetaminophen (TYLENOL) 500 MG tablet Take 1,000 mg by mouth every 6 (six) hours as needed for pain.      Marland Kitchen allopurinol (ZYLOPRIM) 100 MG tablet Take 100 mg by mouth daily.      . Calcium-Vitamin D (CALTRATE 600 PLUS-VIT D PO) Take 1 tablet by mouth 2 (two) times daily.       Marland Kitchen lisinopril-hydrochlorothiazide (PRINZIDE,ZESTORETIC) 20-25 MG per tablet Take 0.5 tablets by mouth daily.       Marland Kitchen loratadine (CLARITIN) 10 MG tablet Take 10 mg by mouth daily.      . methocarbamol (ROBAXIN) 500 MG tablet Take 500 mg by mouth every 6 (six) hours as needed (for muscle pain.).      Marland Kitchen metoprolol (LOPRESSOR) 50 MG tablet Take 0.5 tablets (25 mg total) by mouth 2 (two) times daily.  15 tablet  0  . Omega-3 Fatty Acids (FISH OIL) 1000 MG CAPS Take 2,000 mg by mouth 2 (two) times daily.      . predniSONE (DELTASONE) 10 MG tablet Take 5 mg by mouth daily.       . traMADol (ULTRAM) 50 MG tablet Take 50-100 mg by mouth every 4 (four) hours as needed for pain.  No current facility-administered medications for this visit.    ALLERGIES:  has No Known Allergies.  REVIEW OF SYSTEMS:  The rest of the 14-point review of system was negative.   Filed Vitals:   04/19/13 0921  BP: 120/57  Pulse: 57  Temp: 97.7 F (36.5 C)  Resp: 18   Wt Readings from Last 3 Encounters:  04/19/13 198 lb 12.8 oz (90.175 kg)  02/08/13 202 lb 9.6 oz (91.9 kg)  01/26/13 203 lb 9.6 oz (92.352 kg)   ECOG Performance status: 0  PHYSICAL EXAMINATION:   General:  Mildly obese man, in no acute distress.  Eyes:  no scleral icterus.  ENT:  There were no oropharyngeal lesions.  Neck was without thyromegaly.  Lymphatics:  Negative cervical, supraclavicular or  axillary adenopathy.  Respiratory: lungs were clear bilaterally without wheezing or crackles.  Cardiovascular:  Regular rate and rhythm, S1/S2, without murmur, rub or gallop.  There was no pedal edema.  GI:  abdomen was soft, flat, nontender, nondistended, without organomegaly.  Muscoloskeletal:  no spinal tenderness of palpation of vertebral spine.  Skin exam was without echymosis, petichae.  Neuro exam was nonfocal.  Patient was able to get on and off exam table without assistance.  Gait was normal.  Patient was alert and oriented.  Attention was good.   Language was appropriate.  Mood was normal without depression.  Speech was not pressured.  Thought content was not tangential.     LABORATORY/RADIOLOGY DATA:  Lab Results  Component Value Date   WBC 5.8 04/09/2013   HGB 8.3* 04/09/2013   HCT 25.3* 04/09/2013   PLT 78* 04/09/2013   GLUCOSE 113* 04/09/2013   ALKPHOS 63 04/09/2013   ALT 15 04/09/2013   AST 15 04/09/2013   NA 139 04/09/2013   K 4.0 04/09/2013   CL 102 04/09/2013   CREATININE 0.8 04/09/2013   BUN 18.1 04/09/2013   CO2 26 04/09/2013   INR 1.15 01/26/2013   HGBA1C 6.3* 02/08/2013    ASSESSMENT AND PLAN:  1.  MDS:  His Hgb and Platelet have been low since 01/2013.  Most likely the timing with his knee replacement was coincidental.  However, it has been 3 months now and he still has anemia and thrombocytopenia worse than before.  I need to rule out progression of MDS to higher stage where he may benefit from either Aranesp or Vidaza. I recommended diagnostic bone marrow biopsy. [name] and his wife expressed informed understanding and agreed to proceed with bone marrow biopsy next week.     2.  Slight anemia and thrombocytopenia:  From #1.    3.  SOB:  From #1 and 2.   4.  Rheumatoid arthritis:  Continue low dose Prednisone per PCP.   5.  Follow up:  In about 1 month for final result of bone marrow biopsy.   The length of time of the face-to-face encounter was 25 minutes. More than 50% of time  was spent counseling and coordination of care.

## 2013-04-23 ENCOUNTER — Ambulatory Visit: Payer: Medicare Other | Admitting: Oncology

## 2013-04-23 ENCOUNTER — Other Ambulatory Visit: Payer: Medicare Other

## 2013-04-26 ENCOUNTER — Other Ambulatory Visit (HOSPITAL_COMMUNITY)
Admission: RE | Admit: 2013-04-26 | Discharge: 2013-04-26 | Disposition: A | Discharge status: Home / Self Care | Payer: Medicare Other | Source: Ambulatory Visit | Attending: Oncology | Admitting: Oncology

## 2013-04-26 ENCOUNTER — Ambulatory Visit (HOSPITAL_BASED_OUTPATIENT_CLINIC_OR_DEPARTMENT_OTHER): Payer: Medicare Other | Admitting: Oncology

## 2013-04-26 ENCOUNTER — Other Ambulatory Visit: Payer: Medicare Other | Admitting: Lab

## 2013-04-26 VITALS — BP 113/65 | HR 57 | Temp 97.1°F | Resp 20

## 2013-04-26 DIAGNOSIS — D509 Iron deficiency anemia, unspecified: Secondary | ICD-10-CM | POA: Insufficient documentation

## 2013-04-26 DIAGNOSIS — D696 Thrombocytopenia, unspecified: Secondary | ICD-10-CM

## 2013-04-26 DIAGNOSIS — D469 Myelodysplastic syndrome, unspecified: Secondary | ICD-10-CM | POA: Insufficient documentation

## 2013-04-26 DIAGNOSIS — D4620 Refractory anemia with excess of blasts, unspecified: Secondary | ICD-10-CM

## 2013-04-26 DIAGNOSIS — D46Z Other myelodysplastic syndromes: Secondary | ICD-10-CM

## 2013-04-26 DIAGNOSIS — D649 Anemia, unspecified: Secondary | ICD-10-CM

## 2013-04-26 LAB — CBC
MCH: 25.9 pg — ABNORMAL LOW (ref 26.0–34.0)
MCV: 77.6 fL — ABNORMAL LOW (ref 78.0–100.0)
Platelets: 83 10*3/uL — ABNORMAL LOW (ref 150–400)
RDW: 27.1 % — ABNORMAL HIGH (ref 11.5–15.5)

## 2013-04-26 NOTE — Procedures (Signed)
   [facility]  Telephone:([phone] Fax:(336) [phone]   BONE MARROW BIOPSY AND ASPIRATION   INDICATION:  History of low grade MDS; now with progressive anemia, thrombocytopenia.  Rule out disease progression.   Procedure: After obtained from consent, PAIGE VANDERWOUDE was placed in the prone position. Time out was performed verifying correct patient and procedure. The skin overlying the left posterior crest was prepped with Betadine and draped in the usual sterile fashion. The skin and periosteum were infiltrated with 20 mL of 2% lidocaine. A small puncture wound was made with #11 scalpel blade.  Bone marrow aspirate was obtained on the first pass of the aspiration needle.  One separate core biopsy was obtained through the same incision.   The aspirate was sent for routine histology, flow cytometry, and cytogenetics.  Core biopsy was sent for routine histology.   Shellia Cleverly tolerated procedure well with minimal  blood loss and without immediate complication.   A sterile dressing was applied.   [name] M.D. 04/26/2013

## 2013-04-26 NOTE — Progress Notes (Signed)
Dressing noted to have visible blood. More dressing applied with Hyperfix tape. Patient repositioned to apply more pressure to the site. Dressing checked again at 0930. No more bleeding visible, Dressing reinforced, Pt given discharge instructions. Pt denies complaints at this time. Pt discharged alert and ambulatory.

## 2013-04-26 NOTE — Patient Instructions (Addendum)
[facility] Discharge Instructions for Post Bone Marrow Procedure  Today you had a bone marrow biopsy and aspirate of left hip Please keep the pressure dressing in place for at least 24 hours.  Have someone check your dressing periodically for bleeding.  If needed you can reapply a pressure dressing to the site.  Take pain medication as directed.  IF BLEEDING REOCCURS THAT SHOULD BE REPORTED IMMEDIATELY. Call the Cancer Center at [phone] if during business hours. Or report to the Emergency Room.   I have been informed and understand all the instructions given to me. I know to contact the clinic, my physician, or go to the Emergency Department if any problems should occur. I do not have any questions at this time, but understand that I may call the clinic during office hours at [phone]  should I have any questions or need assistance in obtaining follow up care.    __________________________________________  _____________  __________ Signature of Patient or Authorized Representative            Date                   Time    __________________________________________ Nurse's Signature

## 2013-05-08 ENCOUNTER — Encounter: Payer: Self-pay | Admitting: Oncology

## 2013-05-08 LAB — TISSUE HYBRIDIZATION (BONE MARROW)-NCBH

## 2013-05-16 ENCOUNTER — Encounter: Payer: Self-pay | Admitting: Oncology

## 2013-05-16 ENCOUNTER — Other Ambulatory Visit: Payer: Self-pay | Admitting: Oncology

## 2013-05-21 ENCOUNTER — Other Ambulatory Visit: Payer: Medicare Other | Admitting: Lab

## 2013-05-21 ENCOUNTER — Ambulatory Visit: Payer: Medicare Other | Admitting: Internal Medicine

## 2013-05-24 ENCOUNTER — Ambulatory Visit (HOSPITAL_BASED_OUTPATIENT_CLINIC_OR_DEPARTMENT_OTHER): Payer: Medicare Other | Admitting: Oncology

## 2013-05-24 ENCOUNTER — Other Ambulatory Visit (HOSPITAL_BASED_OUTPATIENT_CLINIC_OR_DEPARTMENT_OTHER): Payer: Medicare Other | Admitting: Lab

## 2013-05-24 ENCOUNTER — Telehealth: Payer: Self-pay | Admitting: Oncology

## 2013-05-24 VITALS — BP 114/56 | HR 50 | Temp 97.7°F | Resp 18 | Ht 68.5 in | Wt 202.0 lb

## 2013-05-24 DIAGNOSIS — D4620 Refractory anemia with excess of blasts, unspecified: Secondary | ICD-10-CM

## 2013-05-24 DIAGNOSIS — D649 Anemia, unspecified: Secondary | ICD-10-CM

## 2013-05-24 DIAGNOSIS — D46Z Other myelodysplastic syndromes: Secondary | ICD-10-CM

## 2013-05-24 DIAGNOSIS — D696 Thrombocytopenia, unspecified: Secondary | ICD-10-CM

## 2013-05-24 DIAGNOSIS — M069 Rheumatoid arthritis, unspecified: Secondary | ICD-10-CM

## 2013-05-24 LAB — CBC WITH DIFFERENTIAL/PLATELET
EOS%: 0 % (ref 0.0–7.0)
Eosinophils Absolute: 0 10*3/uL (ref 0.0–0.5)
HGB: 8.5 g/dL — ABNORMAL LOW (ref 13.0–17.1)
MCH: 25 pg — ABNORMAL LOW (ref 27.2–33.4)
MCV: 77.9 fL — ABNORMAL LOW (ref 79.3–98.0)
MONO%: 13.6 % (ref 0.0–14.0)
NEUT#: 5 10*3/uL (ref 1.5–6.5)
RBC: 3.4 10*6/uL — ABNORMAL LOW (ref 4.20–5.82)
RDW: 27.8 % — ABNORMAL HIGH (ref 11.0–14.6)
lymph#: 2.1 10*3/uL (ref 0.9–3.3)
nRBC: 1 % — ABNORMAL HIGH (ref 0–0)

## 2013-05-24 LAB — TECHNOLOGIST REVIEW

## 2013-05-24 MED ORDER — DARBEPOETIN ALFA-POLYSORBATE 150 MCG/0.3ML IJ SOLN
150.0000 ug | Freq: Once | INTRAMUSCULAR | Status: AC
  Administered 2013-05-24: 150 ug via SUBCUTANEOUS
  Filled 2013-05-24: qty 0.3

## 2013-05-24 NOTE — Progress Notes (Signed)
[facility]  Telephone:(336) [phone] [phone]   OFFICE PROGRESS NOTE   Cc:  [name], MD  DIAGNOSIS:  MDS; intermediate 1 risk; normal cytogenetics and FISH panel for MDS; last bone marrow biopsy in 04/2013 showed 4% blast; with normal cytogenetics and MDS FISH panel.    CURRENT THERAPY: watchful observation.   INTERVAL HISTORY: Eric [name] 67 y.o. male returns for regular follow up with his wife.  He reports feeling well.  He does a lot of yard work.  With heavy exertion, he has mild DOS.  At rest, he denied SOB, cough, chest pain, bleeding symptoms.  His right knee has no pain now; and the pain in the left knee has also improved. The rest of the 14-point review of system was negative.     Past Medical History  Diagnosis Date  . MDS (myelodysplastic syndrome), low grade 2012    on observation; MDS FISH panel on 04/26/2013 was normal; cytogenetics were also negative.   . Rheumatoid arthritis(714.0)     was on MTX until MDS dx  . Hypertension   . Anemia   . Family history of ischemic heart disease   . Shortness of breath     exertional  . Dysrhythmia   . Tachycardia 2013    evaluated by Dr Eldridge Dace @ [place]  . Gout     Past Surgical History  Procedure Laterality Date  . Knee arthroscopy Left   . Hernia repair    . Umbilical hernia repair    . Circumcision    . Total knee arthroplasty Right 02/02/2013    Procedure: TOTAL KNEE ARTHROPLASTY;  Surgeon: Thera Flake., MD;  Location: [facility] OR;  Service: Orthopedics;  Laterality: Right;  RIGHT ARTHROPLASTY KNEE MEDIAL/LATERAL COMPARTMENTS WITH PATELLA RESURFACING ANESTHESIA: GENERAL, PRE/POST OP FEMORAL NERVE    Current Outpatient Prescriptions  Medication Sig Dispense Refill  . acetaminophen (TYLENOL) 500 MG tablet Take 1,000 mg by mouth every 6 (six) hours as needed for pain.      Marland Kitchen allopurinol (ZYLOPRIM) 100 MG tablet Take 100 mg by mouth daily.      . Calcium-Vitamin D (CALTRATE 600  PLUS-VIT D PO) Take 1 tablet by mouth 2 (two) times daily.       Marland Kitchen lisinopril-hydrochlorothiazide (PRINZIDE,ZESTORETIC) 20-25 MG per tablet Take 0.5 tablets by mouth daily.       Marland Kitchen loratadine (CLARITIN) 10 MG tablet Take 10 mg by mouth daily.      . methocarbamol (ROBAXIN) 500 MG tablet Take 500 mg by mouth every 6 (six) hours as needed (for muscle pain.).      Marland Kitchen metoprolol (LOPRESSOR) 50 MG tablet Take 0.5 tablets (25 mg total) by mouth 2 (two) times daily.  15 tablet  0  . Omega-3 Fatty Acids (FISH OIL) 1000 MG CAPS Take 2,000 mg by mouth 2 (two) times daily.      . predniSONE (DELTASONE) 5 MG tablet Take 5 mg by mouth daily. Take 2 tablets (10mg ) every other day; take 1 and 1/2 (7 & 1/2) tablet all other days.      . traMADol (ULTRAM) 50 MG tablet Take 50-100 mg by mouth every 4 (four) hours as needed for pain.       No current facility-administered medications for this visit.    ALLERGIES:  has No Known Allergies.  REVIEW OF SYSTEMS:  The rest of the 14-point review of system was negative.   Filed Vitals:   05/24/13 0942  BP:  114/56  Pulse: 50  Temp: 97.7 F (36.5 C)  Resp: 18   Wt Readings from Last 3 Encounters:  05/24/13 202 lb (91.627 kg)  04/19/13 198 lb 12.8 oz (90.175 kg)  02/08/13 202 lb 9.6 oz (91.9 kg)   ECOG Performance status: 0  PHYSICAL EXAMINATION:   General:  Mildly obese man, in no acute distress.  Eyes:  no scleral icterus.  ENT:  There were no oropharyngeal lesions.  Neck was without thyromegaly.  Lymphatics:  Negative cervical, supraclavicular or axillary adenopathy.  Respiratory: lungs were clear bilaterally without wheezing or crackles.  Cardiovascular:  Regular rate and rhythm, S1/S2, without murmur, rub or gallop.  There was no pedal edema.  GI:  abdomen was soft, flat, nontender, nondistended, without organomegaly.  Muscoloskeletal:  no spinal tenderness of palpation of vertebral spine.  Skin exam was without echymosis, petichae.  Neuro exam was nonfocal.   Patient was able to get on and off exam table without assistance.  Gait was normal.  Patient was alert and oriented.  Attention was good.   Language was appropriate.  Mood was normal without depression.  Speech was not pressured.  Thought content was not tangential.     LABORATORY/RADIOLOGY DATA:  Lab Results  Component Value Date   WBC 8.1 05/24/2013   HGB 8.5* 05/24/2013   HCT 26.5* 05/24/2013   PLT 73* 05/24/2013   GLUCOSE 113* 04/09/2013   ALKPHOS 63 04/09/2013   ALT 15 04/09/2013   AST 15 04/09/2013   NA 139 04/09/2013   K 4.0 04/09/2013   CL 102 04/09/2013   CREATININE 0.8 04/09/2013   BUN 18.1 04/09/2013   CO2 26 04/09/2013   INR 1.15 01/26/2013   HGBA1C 6.3* 02/08/2013    ASSESSMENT AND PLAN:  1.  Diagnosis:  MDS (myelodysplastic syndrome); intermediate 1 risk (due to anemia and low platelet count) 2.  Recommendations:  *  Start Aranesp (hormone for red blood cell) subcutaenous injection every month for the next 3 months.  If no response in a few months, we can increase the dose and add on Neulasta (hormone booster for white blood cell) which has bene known to increase effect of Aranesp.  *  After 6 months, if no response, or if platelet drops to less than 50, we may need to start chemo Vidaza IV d1-7 every 4 weeks cycle.   I discussed with Mr. Heckard and his wife that Aranesp can improve his Hgb, and his stamina.  There is a slight increased risk of CVA, CAD.  He and his wife expressed informed understanding and wished to start Aranesp today.   3.  Rheumatoid arthritis:  Continue low dose Prednisone per PCP.   4.  Follow up:  Lab appointment monthly with Aranesp.  Return visit in about 3 months.     I informed Mr. Oxley and his wife that I am leaving the practice.  The Cancer Center will arrange for him to see another provider when he returns.     The length of time of the face-to-face encounter was 25 minutes. More than 50% of time was spent counseling and coordination of care.

## 2013-05-24 NOTE — Patient Instructions (Addendum)
1.  Diagnosis:  MDS (myelodysplastic syndrome); intermediate 1 risk (due to anemia and low platelet count) 2.  Recommendations:  *  Start Aranesp (hormone for red blood cell) subcutaenous injection every month for the next 3 months.  If no response in a few months, we can increase the dose and add on Neulasta (hormone booster for white blood cell) which has bene known to increase effect of Aranesp.  *  After 6 months, if no response, or if platelet drops to less than 50, we may need to start chemo Vidaza IV d1-7 every 4 weeks cycle.

## 2013-05-25 ENCOUNTER — Telehealth: Payer: Self-pay | Admitting: Oncology

## 2013-05-29 ENCOUNTER — Encounter: Payer: Self-pay | Admitting: Oncology

## 2013-05-31 ENCOUNTER — Telehealth: Payer: Self-pay | Admitting: *Deleted

## 2013-05-31 NOTE — Telephone Encounter (Signed)
Pt requests letter or phone call from [name] to Theadore Nan at the [facility].. (phone [phone],  Fax [phone]).  They will check pt's and wife's well water if [name] can instruct them what to test for.  They recommend testing for heavy metals, including zinc and uranium, along w/ insecticides.  Is there anything else [name] thinks should be checked?

## 2013-06-06 ENCOUNTER — Encounter: Payer: Self-pay | Admitting: *Deleted

## 2013-06-06 ENCOUNTER — Encounter: Payer: Self-pay | Admitting: Oncology

## 2013-06-06 NOTE — Progress Notes (Signed)
Faxed letter from [name] to Nils Pyle at [facility] Dept.. To fax [phone] w/ orders to check pt's well water for carcinogens.  See letter for more detail.

## 2013-06-20 ENCOUNTER — Other Ambulatory Visit (HOSPITAL_BASED_OUTPATIENT_CLINIC_OR_DEPARTMENT_OTHER): Payer: Medicare Other | Admitting: Lab

## 2013-06-20 ENCOUNTER — Other Ambulatory Visit: Payer: Self-pay | Admitting: Oncology

## 2013-06-20 ENCOUNTER — Ambulatory Visit (HOSPITAL_BASED_OUTPATIENT_CLINIC_OR_DEPARTMENT_OTHER): Payer: Medicare Other

## 2013-06-20 VITALS — BP 120/57 | HR 54 | Temp 98.2°F

## 2013-06-20 DIAGNOSIS — D4620 Refractory anemia with excess of blasts, unspecified: Secondary | ICD-10-CM

## 2013-06-20 DIAGNOSIS — D469 Myelodysplastic syndrome, unspecified: Secondary | ICD-10-CM

## 2013-06-20 LAB — CBC WITH DIFFERENTIAL/PLATELET
BASO%: 0.2 % (ref 0.0–2.0)
EOS%: 0 % (ref 0.0–7.0)
MCH: 24.5 pg — ABNORMAL LOW (ref 27.2–33.4)
MCHC: 31.6 g/dL — ABNORMAL LOW (ref 32.0–36.0)
MONO#: 0.5 10*3/uL (ref 0.1–0.9)
RDW: 27.8 % — ABNORMAL HIGH (ref 11.0–14.6)
WBC: 5.9 10*3/uL (ref 4.0–10.3)
lymph#: 2.1 10*3/uL (ref 0.9–3.3)
nRBC: 0 % (ref 0–0)

## 2013-06-20 LAB — TECHNOLOGIST REVIEW

## 2013-06-20 MED ORDER — DARBEPOETIN ALFA-POLYSORBATE 150 MCG/0.3ML IJ SOLN
150.0000 ug | Freq: Once | INTRAMUSCULAR | Status: AC
  Administered 2013-06-20: 150 ug via SUBCUTANEOUS
  Filled 2013-06-20: qty 0.3

## 2013-06-21 ENCOUNTER — Other Ambulatory Visit: Payer: Self-pay | Admitting: Oncology

## 2013-06-22 ENCOUNTER — Ambulatory Visit: Payer: Medicare Other

## 2013-06-22 ENCOUNTER — Other Ambulatory Visit: Payer: Medicare Other | Admitting: Lab

## 2013-07-23 ENCOUNTER — Ambulatory Visit (HOSPITAL_BASED_OUTPATIENT_CLINIC_OR_DEPARTMENT_OTHER): Payer: Medicare Other

## 2013-07-23 ENCOUNTER — Other Ambulatory Visit (HOSPITAL_BASED_OUTPATIENT_CLINIC_OR_DEPARTMENT_OTHER): Payer: Medicare Other | Admitting: Lab

## 2013-07-23 VITALS — BP 127/59 | HR 52 | Temp 97.6°F

## 2013-07-23 DIAGNOSIS — D4620 Refractory anemia with excess of blasts, unspecified: Secondary | ICD-10-CM

## 2013-07-23 DIAGNOSIS — D469 Myelodysplastic syndrome, unspecified: Secondary | ICD-10-CM

## 2013-07-23 LAB — CBC WITH DIFFERENTIAL/PLATELET
BASO%: 0.3 % (ref 0.0–2.0)
LYMPH%: 29.8 % (ref 14.0–49.0)
MCHC: 31.8 g/dL — ABNORMAL LOW (ref 32.0–36.0)
MONO#: 0.7 10*3/uL (ref 0.1–0.9)
NEUT#: 4.3 10*3/uL (ref 1.5–6.5)
RBC: 3.34 10*6/uL — ABNORMAL LOW (ref 4.20–5.82)
RDW: 27.8 % — ABNORMAL HIGH (ref 11.0–14.6)
WBC: 7.1 10*3/uL (ref 4.0–10.3)
lymph#: 2.1 10*3/uL (ref 0.9–3.3)
nRBC: 1 % — ABNORMAL HIGH (ref 0–0)

## 2013-07-23 LAB — TECHNOLOGIST REVIEW

## 2013-07-23 MED ORDER — DARBEPOETIN ALFA-POLYSORBATE 300 MCG/0.6ML IJ SOLN
300.0000 ug | Freq: Once | INTRAMUSCULAR | Status: AC
  Administered 2013-07-23: 300 ug via SUBCUTANEOUS
  Filled 2013-07-23: qty 0.6

## 2013-08-22 ENCOUNTER — Other Ambulatory Visit: Payer: Self-pay | Admitting: *Deleted

## 2013-08-22 DIAGNOSIS — D4620 Refractory anemia with excess of blasts, unspecified: Secondary | ICD-10-CM

## 2013-08-23 ENCOUNTER — Other Ambulatory Visit (HOSPITAL_BASED_OUTPATIENT_CLINIC_OR_DEPARTMENT_OTHER): Payer: Medicare Other

## 2013-08-23 ENCOUNTER — Encounter: Payer: Self-pay | Admitting: Hematology and Oncology

## 2013-08-23 ENCOUNTER — Ambulatory Visit (HOSPITAL_BASED_OUTPATIENT_CLINIC_OR_DEPARTMENT_OTHER): Payer: Medicare Other

## 2013-08-23 ENCOUNTER — Telehealth: Payer: Self-pay | Admitting: Hematology and Oncology

## 2013-08-23 ENCOUNTER — Ambulatory Visit (HOSPITAL_BASED_OUTPATIENT_CLINIC_OR_DEPARTMENT_OTHER): Payer: Medicare Other | Admitting: Hematology and Oncology

## 2013-08-23 VITALS — BP 160/75 | HR 62 | Temp 97.5°F | Resp 18 | Ht 68.5 in | Wt 204.5 lb

## 2013-08-23 DIAGNOSIS — D4620 Refractory anemia with excess of blasts, unspecified: Secondary | ICD-10-CM

## 2013-08-23 DIAGNOSIS — Z23 Encounter for immunization: Secondary | ICD-10-CM

## 2013-08-23 DIAGNOSIS — D649 Anemia, unspecified: Secondary | ICD-10-CM

## 2013-08-23 DIAGNOSIS — D696 Thrombocytopenia, unspecified: Secondary | ICD-10-CM

## 2013-08-23 HISTORY — DX: Encounter for immunization: Z23

## 2013-08-23 LAB — CBC WITH DIFFERENTIAL/PLATELET
EOS%: 0 % (ref 0.0–7.0)
Eosinophils Absolute: 0 10*3/uL (ref 0.0–0.5)
MCV: 76.9 fL — ABNORMAL LOW (ref 79.3–98.0)
MONO%: 8.9 % (ref 0.0–14.0)
NEUT#: 4.5 10*3/uL (ref 1.5–6.5)
RBC: 3.51 10*6/uL — ABNORMAL LOW (ref 4.20–5.82)
RDW: 27.7 % — ABNORMAL HIGH (ref 11.0–14.6)
lymph#: 2.1 10*3/uL (ref 0.9–3.3)
nRBC: 0 % (ref 0–0)

## 2013-08-23 LAB — TECHNOLOGIST REVIEW

## 2013-08-23 MED ORDER — INFLUENZA VAC SPLIT QUAD 0.5 ML IM SUSP
0.5000 mL | INTRAMUSCULAR | Status: DC
  Filled 2013-08-23: qty 0.5

## 2013-08-23 MED ORDER — INFLUENZA VAC SPLIT QUAD 0.5 ML IM SUSP
0.5000 mL | Freq: Once | INTRAMUSCULAR | Status: AC
  Administered 2013-08-23: 0.5 mL via INTRAMUSCULAR
  Filled 2013-08-23: qty 0.5

## 2013-08-23 MED ORDER — DARBEPOETIN ALFA-POLYSORBATE 300 MCG/0.6ML IJ SOLN
300.0000 ug | Freq: Once | INTRAMUSCULAR | Status: AC
  Administered 2013-08-23: 300 ug via SUBCUTANEOUS
  Filled 2013-08-23: qty 0.6

## 2013-08-23 NOTE — Progress Notes (Signed)
[facility] OFFICE PROGRESS NOTE  [name], MD [address] [place] [address] No chief complaint on file.   DIAGNOSIS: Myelodysplastic syndrome, intermediate risk category, with normal cytogenetics and fish panel, ongoing treatment with erythropoietin stimulating agents.  SUMMARY OF HEMATOLOGIC HISTORY:  I have reviewed his records extensively and collaborated the history with the patient. The patient had a history of anemia and thrombocytopenia. In May of 2014 he underwent bone marrow aspirate and biopsy which showed 4% lost, with normal cytogenetics and MDS fish panel. From May to September 2014, he has received 3 doses of Aranesp. The first 2 injections at 150 mcg dose, and a month ago he received 300 mcg.  INTERVAL HISTORY: Eric [name] 67 y.o. male returns for followup prior to cycle 4 of Aranesp. He denies any recent infections. Denies any recent fevers chills or night sweats. He denies any recent bleeding such as epistaxis hematuria or hematochezia. He has easy bruising. His appetite is stable and denies any recent weight loss. 6 months ago he underwent left knee replacement surgery and is doing well. He is very active in activities of daily living.  I have reviewed the past medical history, past surgical history, social history and family history with the patient and they are unchanged from previous note.  ALLERGIES:  has No Known Allergies.  MEDICATIONS: has a current medication list which includes the following prescription(s): acetaminophen, allopurinol, calcium-vitamin d, lisinopril-hydrochlorothiazide, loratadine, metoprolol, fish oil, prednisone, tramadol, and methocarbamol, and the following Facility-Administered Medications: influenza vac split quadrivalent pf.   REVIEW OF SYSTEMS:   Constitutional: Denies fevers, chills or abnormal weight loss Eyes: Denies blurriness of vision Ears, nose, mouth, throat, and face: Denies mucositis or  sore throat Respiratory: Denies cough, dyspnea or wheezes Cardiovascular: Denies palpitation, chest discomfort or lower extremity swelling Gastrointestinal:  Denies nausea, heartburn or change in bowel habits Skin: Denies abnormal skin rashes Lymphatics: Denies new lymphadenopathy or easy bruising Neurological:Denies numbness, tingling or new weaknesses Behavioral/Psych: Mood is stable, no new changes  All other systems were reviewed with the patient and are negative.  PHYSICAL EXAMINATION: ECOG PERFORMANCE STATUS: 0 - Asymptomatic  Filed Vitals:   08/23/13 0805  BP: 160/75  Pulse: 62  Temp: 97.5 F (36.4 C)  Resp: 18    GENERAL:alert, no distress and comfortable SKIN: skin color, texture, turgor are normal, no rashes or significant lesions EYES: normal, Conjunctiva are pink and non-injected, sclera clear OROPHARYNX:no exudate, no erythema and lips, buccal mucosa, and tongue normal  NECK: supple, thyroid normal size, non-tender, without nodularity LYMPH:  no palpable lymphadenopathy in the cervical, axillary or inguinal LUNGS: clear to auscultation and percussion with normal breathing effort HEART: regular rate & rhythm and no murmurs and no lower extremity edema ABDOMEN:abdomen soft, non-tender and normal bowel sounds Musculoskeletal:no cyanosis of digits and no clubbing  NEURO: alert & oriented x 3 with fluent speech, no focal motor/sensory deficits  LABORATORY DATA:  I have reviewed the data as listed Results for orders placed in visit on 08/23/13 (from the past 48 hour(s))  CBC WITH DIFFERENTIAL     Status: Abnormal   Collection Time    08/23/13  7:53 AM      Result Value Range   WBC 7.3  4.0 - 10.3 10e3/uL   NEUT# 4.5  1.5 - 6.5 10e3/uL   HGB 8.6 (*) 13.0 - 17.1 g/dL   HCT 47.8 (*) 29.5 - 62.1 %   Platelets 57 (*) 140 - 400  10e3/uL   MCV 76.9 (*) 79.3 - 98.0 fL   MCH 24.5 (*) 27.2 - 33.4 pg   MCHC 31.9 (*) 32.0 - 36.0 g/dL   RBC 5.40 (*) 9.81 - 1.91 10e6/uL    RDW 27.7 (*) 11.0 - 14.6 %   lymph# 2.1  0.9 - 3.3 10e3/uL   MONO# 0.7  0.1 - 0.9 10e3/uL   Eosinophils Absolute 0.0  0.0 - 0.5 10e3/uL   Basophils Absolute 0.0  0.0 - 0.1 10e3/uL   NEUT% 61.7  39.0 - 75.0 %   LYMPH% 29.3  14.0 - 49.0 %   MONO% 8.9  0.0 - 14.0 %   EOS% 0.0  0.0 - 7.0 %   BASO% 0.1  0.0 - 2.0 %   nRBC 0  0 - 0 %    ASSESSMENT: Myelodysplastic syndrome, ongoing treatment with erythropoietin stimulating begins.   PLAN:  #1 myelodysplastic syndrome After reviewing his medical records, there were a few missing information. Rather than redrawing his blood today, I recommend deferring those additional workup until his next visit. With increased dose of his treatment, his blood counts appear to be responding. His hemoglobin is improving. The patient is asymptomatic. In the future, improve his response to treatment, we could potentially reduce the interval between injections, increase the dose of his prednisone. Prednisone is known to have some minor effect on patient with low-grade myelodysplastic syndrome. At present time he is in agreement to proceed with this injection today without dose adjustment. #2 anemia The patient is asymptomatic. We will defer blood transfusions in the future less he becomes symptomatic. #3 thrombocytopenia This is related to his underlying bone marrow disorder. He is asymptomatic from just minor bruising. We will observe for now. #4 preventive care I recommend influenza vaccination today and he is in agreement to proceed. All questions were answered. The patient knows to call the clinic with any problems, questions or concerns. We can certainly see the patient much sooner if necessary. No barriers to learning was detected.  The patient and plan discussed with [facility], Zanetta Dehaan  and he is in agreement with the aforementioned.  I spent 25 minutes counseling the patient face to face. The total time spent in the appointment was 40 minutes and more than 50% was  on counseling.     Cynia Abruzzo, MD 08/23/2013 8:30 AM

## 2013-08-23 NOTE — Telephone Encounter (Signed)
Gave pt apppt for lab, October and MD for October 2014

## 2013-09-20 ENCOUNTER — Other Ambulatory Visit: Payer: Self-pay | Admitting: Hematology and Oncology

## 2013-09-21 ENCOUNTER — Ambulatory Visit: Payer: Medicare Other

## 2013-09-21 ENCOUNTER — Ambulatory Visit (HOSPITAL_BASED_OUTPATIENT_CLINIC_OR_DEPARTMENT_OTHER): Payer: Medicare Other

## 2013-09-21 ENCOUNTER — Telehealth: Payer: Self-pay | Admitting: Hematology and Oncology

## 2013-09-21 ENCOUNTER — Other Ambulatory Visit: Payer: Medicare Other | Admitting: Lab

## 2013-09-21 ENCOUNTER — Other Ambulatory Visit (HOSPITAL_BASED_OUTPATIENT_CLINIC_OR_DEPARTMENT_OTHER): Payer: Medicare Other | Admitting: Lab

## 2013-09-21 ENCOUNTER — Ambulatory Visit (HOSPITAL_BASED_OUTPATIENT_CLINIC_OR_DEPARTMENT_OTHER): Payer: Medicare Other | Admitting: Hematology and Oncology

## 2013-09-21 ENCOUNTER — Encounter (INDEPENDENT_AMBULATORY_CARE_PROVIDER_SITE_OTHER): Payer: Self-pay

## 2013-09-21 VITALS — BP 122/62 | HR 55 | Temp 97.1°F | Resp 19 | Ht 68.5 in | Wt 203.3 lb

## 2013-09-21 DIAGNOSIS — D649 Anemia, unspecified: Secondary | ICD-10-CM

## 2013-09-21 DIAGNOSIS — D469 Myelodysplastic syndrome, unspecified: Secondary | ICD-10-CM

## 2013-09-21 DIAGNOSIS — D696 Thrombocytopenia, unspecified: Secondary | ICD-10-CM

## 2013-09-21 DIAGNOSIS — D4620 Refractory anemia with excess of blasts, unspecified: Secondary | ICD-10-CM

## 2013-09-21 DIAGNOSIS — M129 Arthropathy, unspecified: Secondary | ICD-10-CM

## 2013-09-21 LAB — CBC & DIFF AND RETIC
Basophils Absolute: 0 10*3/uL (ref 0.0–0.1)
HCT: 25.7 % — ABNORMAL LOW (ref 38.4–49.9)
HGB: 8.2 g/dL — ABNORMAL LOW (ref 13.0–17.1)
Immature Retic Fract: 17.5 % — ABNORMAL HIGH (ref 3.00–10.60)
MONO#: 0.7 10*3/uL (ref 0.1–0.9)
NEUT%: 78 % — ABNORMAL HIGH (ref 39.0–75.0)
Retic Ct Abs: 59.97 10*3/uL (ref 34.80–93.90)
WBC: 7.6 10*3/uL (ref 4.0–10.3)
lymph#: 1 10*3/uL (ref 0.9–3.3)

## 2013-09-21 LAB — COMPREHENSIVE METABOLIC PANEL (CC13)
ALT: 15 U/L (ref 0–55)
AST: 13 U/L (ref 5–34)
Chloride: 106 mEq/L (ref 98–109)
Creatinine: 0.9 mg/dL (ref 0.7–1.3)
Total Bilirubin: 0.81 mg/dL (ref 0.20–1.20)

## 2013-09-21 LAB — TECHNOLOGIST REVIEW

## 2013-09-21 LAB — HOLD TUBE, BLOOD BANK

## 2013-09-21 MED ORDER — DARBEPOETIN ALFA-POLYSORBATE 300 MCG/0.6ML IJ SOLN
300.0000 ug | Freq: Once | INTRAMUSCULAR | Status: AC
  Administered 2013-09-21: 300 ug via SUBCUTANEOUS
  Filled 2013-09-21: qty 0.6

## 2013-09-21 NOTE — Patient Instructions (Signed)
Azacitidine suspension for injection (subcutaneous use) What is this medicine? AZACITIDINE (ay za SITE i deen) is a chemotherapy drug. This medicine reduces the growth of cancer cells and can suppress the immune system. It is used for treating myelodysplastic syndrome or some types of leukemia. This medicine may be used for other purposes; ask your health care provider or pharmacist if you have questions. What should I tell my health care provider before I take this medicine? They need to know if you have any of these conditions: -infection (especially a virus infection such as chickenpox, cold sores, or herpes) -kidney disease -liver disease -liver tumors -an unusual or allergic reaction to azacitidine, mannitol, other medicines, foods, dyes, or preservatives -pregnant or trying to get pregnant -breast-feeding How should I use this medicine? This medicine is for injection under the skin. It is administered in a hospital or clinic by a specially trained health care professional. Talk to your pediatrician regarding the use of this medicine in children. While this drug may be prescribed for selected conditions, precautions do apply. Overdosage: If you think you have taken too much of this medicine contact a poison control center or emergency room at once. NOTE: This medicine is only for you. Do not share this medicine with others. What if I miss a dose? It is important not to miss your dose. Call your doctor or health care professional if you are unable to keep an appointment. What may interact with this medicine? -vaccines Talk to your doctor or health care professional before taking any of these medicines: -acetaminophen -aspirin -ibuprofen -ketoprofen -naproxen This list may not describe all possible interactions. Give your health care provider a list of all the medicines, herbs, non-prescription drugs, or dietary supplements you use. Also tell them if you smoke, drink alcohol, or use  illegal drugs. Some items may interact with your medicine. What should I watch for while using this medicine? Visit your doctor for checks on your progress. This drug may make you feel generally unwell. This is not uncommon, as chemotherapy can affect healthy cells as well as cancer cells. Report any side effects. Continue your course of treatment even though you feel ill unless your doctor tells you to stop. In some cases, you may be given additional medicines to help with side effects. Follow all directions for their use. Call your doctor or health care professional for advice if you get a fever, chills or sore throat, or other symptoms of a cold or flu. Do not treat yourself. This drug decreases your body's ability to fight infections. Try to avoid being around people who are sick. This medicine may increase your risk to bruise or bleed. Call your doctor or health care professional if you notice any unusual bleeding. Be careful brushing and flossing your teeth or using a toothpick because you may get an infection or bleed more easily. If you have any dental work done, tell your dentist you are receiving this medicine. Avoid taking products that contain aspirin, acetaminophen, ibuprofen, naproxen, or ketoprofen unless instructed by your doctor. These medicines may hide a fever. Do not have any vaccinations without your doctor's approval and avoid anyone who has recently had oral polio vaccine. Do not become pregnant while taking this medicine. Women should inform their doctor if they wish to become pregnant or think they might be pregnant. There is a potential for serious side effects to an unborn child. Talk to your health care professional or pharmacist for more information. Do not breast-feed an   infant while taking this medicine. If you are a man, you should not father a child while receiving treatment. What side effects may I notice from receiving this medicine? Side effects that you should report  to your doctor or health care professional as soon as possible: -allergic reactions like skin rash, itching or hives, swelling of the face, lips, or tongue -low blood counts - this medicine may decrease the number of white blood cells, red blood cells and platelets. You may be at increased risk for infections and bleeding. -signs of infection - fever or chills, cough, sore throat, pain or difficulty passing urine -signs of decreased platelets or bleeding - bruising, pinpoint red spots on the skin, black, tarry stools, blood in the urine -signs of decreased red blood cells - unusually weak or tired, fainting spells, lightheadedness -reactions at the injection site including redness, pain, itching, or bruising -breathing problems -changes in vision -fever -mouth sores -stomach pain -vomiting Side effects that usually do not require medical attention (report to your doctor or health care professional if they continue or are bothersome): -constipation -diarrhea -loss of appetite -nausea -pain or redness at the injection site -weak or tired This list may not describe all possible side effects. Call your doctor for medical advice about side effects. You may report side effects to FDA at 1-800-FDA-1088. Where should I keep my medicine? This drug is given in a hospital or clinic and will not be stored at home. NOTE: This sheet is a summary. It may not cover all possible information. If you have questions about this medicine, talk to your doctor, pharmacist, or health care provider.  2013, [employer]. (02/15/2008 11:04:07 AM)  

## 2013-09-21 NOTE — Progress Notes (Signed)
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 The patient had a history of anemia and thrombocytopenia. In May of 2014 he underwent bone marrow aspirate and biopsy which showed 4% lost, with normal cytogenetics and MDS fish panel. From May to September 2014, he has received 3 doses of Aranesp. The first 2 injections at 150 mcg dose, and a month ago he received 300 mcg.   INTERVAL HISTORY: Eric [name] 67 y.o. male returns for followup.  He denies any recent fever, chills, night sweats or abnormal weight loss He denies any recent bleeding such as epistaxis, hematuria, or hematochezia. No recent bleeding. His appetite is stable no recent weight loss.  I have reviewed the past medical history, past surgical history, social history and family history with the patient and they are unchanged from previous note.  ALLERGIES:  has No Known Allergies.  MEDICATIONS:  Current Outpatient Prescriptions  Medication Sig Dispense Refill  . acetaminophen (TYLENOL) 500 MG tablet Take 1,000 mg by mouth every 6 (six) hours as needed for pain.      Marland Kitchen allopurinol (ZYLOPRIM) 100 MG tablet Take 100 mg by mouth daily.      . Calcium-Vitamin D (CALTRATE 600 PLUS-VIT D PO) Take 1 tablet by mouth 2 (two) times daily.       Marland Kitchen lisinopril-hydrochlorothiazide (PRINZIDE,ZESTORETIC) 20-25 MG per tablet Take 0.5 tablets by mouth daily.       Marland Kitchen loratadine (CLARITIN) 10 MG tablet Take 10 mg by mouth daily.      . metoprolol (LOPRESSOR) 50 MG tablet Take 0.5 tablets (25 mg total) by mouth 2 (two) times daily.  15 tablet  0   . Omega-3 Fatty Acids (FISH OIL) 1000 MG CAPS Take 2,000 mg by mouth 2 (two) times daily.      . predniSONE (DELTASONE) 5 MG tablet Take 7.5 mg by mouth daily.       . traMADol (ULTRAM) 50 MG tablet Take 50-100 mg by mouth every 4 (four) hours as needed for pain.       No current facility-administered medications for this visit.    REVIEW OF SYSTEMS:   Constitutional: Denies fevers, chills or abnormal weight loss Eyes: Denies blurriness of vision Ears, nose, mouth, throat, and face: Denies mucositis or sore throat Respiratory: Denies cough, dyspnea or wheezes Cardiovascular: Denies palpitation, chest discomfort or lower extremity swelling Gastrointestinal:  Denies nausea, heartburn or change in bowel habits Skin: Denies abnormal skin rashes Lymphatics: Denies new lymphadenopathy or easy bruising Neurological:Denies numbness, tingling or new weaknesses Behavioral/Psych: Mood is stable, no new changes  All other systems were reviewed with the patient and are negative.  PHYSICAL EXAMINATION: ECOG PERFORMANCE STATUS: 0 - Asymptomatic  Filed Vitals:   09/21/13 1450  BP: 122/62  Pulse: 55  Temp: 97.1 F (36.2 C)  Resp: 19   Filed Weights   09/21/13 1450  Weight: 203 lb 4.8 oz (92.216 kg)    GENERAL:alert, no distress and comfortable SKIN: skin color, texture, turgor are normal, no rashes or significant lesions EYES: normal, Conjunctiva are pink and non-injected, sclera clear OROPHARYNX:no exudate, no erythema and lips, buccal mucosa, and tongue normal  NECK: supple, thyroid normal size, non-tender, without nodularity LYMPH:  no palpable lymphadenopathy in the cervical, axillary or inguinal LUNGS: clear to auscultation and percussion with normal breathing effort HEART: regular rate & rhythm and no murmurs and no lower extremity edema ABDOMEN:abdomen soft, non-tender and normal bowel sounds Musculoskeletal:no cyanosis of digits and no clubbing  NEURO: alert & oriented x 3 with  fluent speech, no focal motor/sensory deficits  LABORATORY DATA:  I have reviewed the data as listed    Component Value Date/Time   NA 139 09/21/2013 1434   NA 135 02/09/2013 0423   K 4.4 09/21/2013 1434   K 4.1 02/09/2013 0423   CL 102 04/09/2013 1347   CL 97 02/09/2013 0423   CO2 25 09/21/2013 1434   CO2 28 02/09/2013 0423   GLUCOSE 114 09/21/2013 1434   GLUCOSE 113* 04/09/2013 1347   GLUCOSE 101* 02/09/2013 0423   BUN 20.4 09/21/2013 1434   BUN 22 02/09/2013 0423   CREATININE 0.9 09/21/2013 1434   CREATININE 0.89 02/09/2013 0423   CALCIUM 9.0 09/21/2013 1434   CALCIUM 8.9 02/09/2013 0423   PROT 7.6 09/21/2013 1434   PROT 8.0 01/26/2013 1018   ALBUMIN 3.7 09/21/2013 1434   ALBUMIN 3.8 01/26/2013 1018   AST 13 09/21/2013 1434   AST 17 01/26/2013 1018   ALT 15 09/21/2013 1434   ALT 17 01/26/2013 1018   ALKPHOS 57 09/21/2013 1434   ALKPHOS 63 01/26/2013 1018   BILITOT 0.81 09/21/2013 1434   BILITOT 0.9 02/09/2013 0423   GFRNONAA 87* 02/09/2013 0423   GFRAA >90 02/09/2013 0423    No results found for this basename: SPEP,  UPEP,   kappa and lambda light chains    Lab Results  Component Value Date   WBC 7.6 09/21/2013   NEUTROABS 5.9 09/21/2013   HGB 8.2* 09/21/2013   HCT 25.7* 09/21/2013   MCV 76.7* 09/21/2013   PLT 69* 09/21/2013      Chemistry      Component Value Date/Time   NA 139 09/21/2013 1434   NA 135 02/09/2013 0423   K 4.4 09/21/2013 1434   K 4.1 02/09/2013 0423   CL 102 04/09/2013 1347   CL 97 02/09/2013 0423   CO2 25 09/21/2013 1434   CO2 28 02/09/2013 0423   BUN 20.4 09/21/2013 1434   BUN 22 02/09/2013 0423   CREATININE 0.9 09/21/2013 1434   CREATININE 0.89 02/09/2013 0423      Component Value Date/Time   CALCIUM 9.0 09/21/2013 1434   CALCIUM 8.9 02/09/2013 0423   ALKPHOS 57 09/21/2013 1434   ALKPHOS 63 01/26/2013 1018   AST 13 09/21/2013 1434   AST 17 01/26/2013 1018   ALT 15 09/21/2013 1434   ALT 17 01/26/2013 1018   BILITOT 0.81 09/21/2013 1434   BILITOT 0.9 02/09/2013 0423      ASSESSMENT:  Myelodysplastic syndrome   PLAN:  #1 myelodysplastic syndrome Discussed with him the possibility of introducing Vidaza. He would need repeat bone marrow aspirate and biopsy. Discuss with him some of the side effects to be expected with chemotherapy. The patient wants to think about it. In the meantime we'll continue supportive care. #2 anemia This is likely anemia related to his disease. The patient denies recent history of bleeding such as epistaxis, hematuria or hematochezia. He is asymptomatic from the anemia. We will observe for now.  he does not require transfusion now. We will continue no increased dose of darbepoetin every month. I did discuss  about possibility of reducing the interval between injections or increased dose of the prednisone. The patient wants to think about it. #3 thrombocytopenia This is to do his myelodysplastic syndrome.The cause is unknown. It is mild and there is little change compared from previous platelet count. The patient denies recent history of bleeding such as epistaxis, hematuria or hematochezia. He is asymptomatic from the thrombocytopenia. I will observe for now.  he does not require transfusion now.  #4 arthritis Is currently on 7.5 mg prednisone. This dose could be potentially be increased to help treat his myelodysplastic syndrome as well.  Orders Placed This Encounter  Procedures  . CBC with Differential    Standing Status: Future     Number of Occurrences:      Standing Expiration Date: 06/13/2014  . Comprehensive metabolic panel    Standing Status: Future     Number of Occurrences:      Standing Expiration Date: 09/21/2014  . Hold Tube, Blood Bank    Standing Status: Future     Number of Occurrences:      Standing Expiration Date: 09/21/2014   All questions were answered. The patient knows to call the clinic with any problems, questions or concerns. No barriers to learning was detected. I spent 25 minutes counseling the patient  face to face. The total time spent in the appointment was 40 minutes and more than 50% was on counseling and review of test results     [facility], Daphane Odekirk, MD 09/21/2013 3:40 PM

## 2013-09-21 NOTE — Telephone Encounter (Signed)
gv and pritned appt sched and avs forpt for NOV adn DEC

## 2013-10-23 ENCOUNTER — Ambulatory Visit: Payer: Medicare Other

## 2013-10-23 ENCOUNTER — Ambulatory Visit (HOSPITAL_BASED_OUTPATIENT_CLINIC_OR_DEPARTMENT_OTHER): Payer: Medicare Other | Admitting: Hematology and Oncology

## 2013-10-23 ENCOUNTER — Other Ambulatory Visit (HOSPITAL_BASED_OUTPATIENT_CLINIC_OR_DEPARTMENT_OTHER): Payer: Medicare Other | Admitting: Lab

## 2013-10-23 ENCOUNTER — Ambulatory Visit: Payer: Medicare Other | Admitting: Hematology and Oncology

## 2013-10-23 ENCOUNTER — Other Ambulatory Visit: Payer: Medicare Other | Admitting: Lab

## 2013-10-23 ENCOUNTER — Encounter (INDEPENDENT_AMBULATORY_CARE_PROVIDER_SITE_OTHER): Payer: Self-pay

## 2013-10-23 ENCOUNTER — Telehealth: Payer: Self-pay | Admitting: Hematology and Oncology

## 2013-10-23 ENCOUNTER — Telehealth: Payer: Self-pay | Admitting: *Deleted

## 2013-10-23 VITALS — BP 129/66 | HR 54 | Temp 97.3°F | Resp 20 | Ht 68.5 in | Wt 202.0 lb

## 2013-10-23 DIAGNOSIS — D649 Anemia, unspecified: Secondary | ICD-10-CM

## 2013-10-23 DIAGNOSIS — D469 Myelodysplastic syndrome, unspecified: Secondary | ICD-10-CM

## 2013-10-23 DIAGNOSIS — D4620 Refractory anemia with excess of blasts, unspecified: Secondary | ICD-10-CM

## 2013-10-23 DIAGNOSIS — M129 Arthropathy, unspecified: Secondary | ICD-10-CM

## 2013-10-23 DIAGNOSIS — D696 Thrombocytopenia, unspecified: Secondary | ICD-10-CM

## 2013-10-23 DIAGNOSIS — D46Z Other myelodysplastic syndromes: Secondary | ICD-10-CM

## 2013-10-23 LAB — CBC WITH DIFFERENTIAL/PLATELET
BASO%: 0.2 % (ref 0.0–2.0)
EOS%: 0 % (ref 0.0–7.0)
Eosinophils Absolute: 0 10*3/uL (ref 0.0–0.5)
LYMPH%: 15.1 % (ref 14.0–49.0)
MCH: 24.8 pg — ABNORMAL LOW (ref 27.2–33.4)
MCHC: 32.1 g/dL (ref 32.0–36.0)
MONO#: 0.9 10*3/uL (ref 0.1–0.9)
MONO%: 10.5 % (ref 0.0–14.0)
NEUT#: 6.3 10*3/uL (ref 1.5–6.5)
Platelets: 73 10*3/uL — ABNORMAL LOW (ref 140–400)
RBC: 3.47 10*6/uL — ABNORMAL LOW (ref 4.20–5.82)
RDW: 26.9 % — ABNORMAL HIGH (ref 11.0–14.6)
WBC: 8.4 10*3/uL (ref 4.0–10.3)
nRBC: 0 % (ref 0–0)

## 2013-10-23 LAB — COMPREHENSIVE METABOLIC PANEL (CC13)
ALT: 15 U/L (ref 0–55)
Anion Gap: 8 mEq/L (ref 3–11)
BUN: 21.2 mg/dL (ref 7.0–26.0)
CO2: 25 mEq/L (ref 22–29)
Chloride: 107 mEq/L (ref 98–109)
Sodium: 141 mEq/L (ref 136–145)
Total Bilirubin: 0.89 mg/dL (ref 0.20–1.20)
Total Protein: 7.6 g/dL (ref 6.4–8.3)

## 2013-10-23 MED ORDER — DARBEPOETIN ALFA-POLYSORBATE 300 MCG/0.6ML IJ SOLN
300.0000 ug | Freq: Once | INTRAMUSCULAR | Status: AC
  Administered 2013-10-23: 300 ug via SUBCUTANEOUS
  Filled 2013-10-23: qty 0.6

## 2013-10-23 NOTE — Telephone Encounter (Signed)
Pt did not show for appts this morning.  Left VM on cell phone asking him to return nurse's call.

## 2013-10-23 NOTE — Telephone Encounter (Signed)
Pt called back and states he thought his appts were tomorrow not today.  Dr. Bertis Ruddy can see him this afternoon at 2 pm.  Instructed pt to come at 1;30 pm for lab, then Dr. Bertis Ruddy.  He verbalized understanding.

## 2013-10-23 NOTE — Telephone Encounter (Signed)
gv and printed appt sched and avs for pt for DEc °

## 2013-10-23 NOTE — Progress Notes (Signed)
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   the patient has been receiving Aranesp injection once a month  INTERVAL HISTORY: Eric [name] 67 y.o. male returns for further followup. He denies any recent fever, chills, night sweats or abnormal weight loss The patient denies any recent signs or symptoms of bleeding such as spontaneous epistaxis, hematuria or hematochezia. He had cold sore developing at the lower lip, improved on topical antiviral medication I have reviewed the past medical history, past surgical history, social history and family history with the patient and they are unchanged from previous note.  ALLERGIES:  has No Known Allergies.  MEDICATIONS:  Current Outpatient Prescriptions  Medication Sig Dispense Refill  . acetaminophen (TYLENOL) 500 MG tablet Take 1,000 mg by mouth every 6 (six) hours as needed for pain.      Marland Kitchen allopurinol (ZYLOPRIM) 100 MG tablet Take 100 mg by mouth daily.      . Calcium-Vitamin D (CALTRATE 600 PLUS-VIT D PO) Take 1 tablet by mouth 2 (two) times daily.       Marland Kitchen lisinopril-hydrochlorothiazide (PRINZIDE,ZESTORETIC) 20-25 MG per tablet Take 0.5 tablets by mouth daily.       Marland Kitchen loratadine (CLARITIN) 10 MG tablet Take 10 mg by mouth daily.      . metoprolol (LOPRESSOR) 50 MG tablet Take 0.5 tablets (25 mg total) by mouth 2 (two) times daily.  15 tablet  0  . Omega-3 Fatty Acids (FISH  OIL) 1000 MG CAPS Take 2,000 mg by mouth 2 (two) times daily.      . predniSONE (DELTASONE) 5 MG tablet Take 7.5 mg by mouth daily.       . traMADol (ULTRAM) 50 MG tablet Take 50-100 mg by mouth every 4 (four) hours as needed for pain.       No current facility-administered medications for this visit.    REVIEW OF SYSTEMS:   Constitutional: Denies fevers, chills or abnormal weight loss Eyes: Denies blurriness of vision Ears, nose, mouth, throat, and face: Denies mucositis or sore throat Respiratory: Denies cough, dyspnea or wheezes Cardiovascular: Denies palpitation, chest discomfort or lower extremity swelling Gastrointestinal:  Denies nausea, heartburn or change in bowel habits Skin: Denies abnormal skin rashes Lymphatics: Denies new lymphadenopathy or easy bruising Neurological:Denies numbness, tingling or new weaknesses Behavioral/Psych: Mood is stable, no new changes  All other systems were reviewed with the patient and are negative.  PHYSICAL EXAMINATION: ECOG PERFORMANCE STATUS: 0 - Asymptomatic  Filed Vitals:   10/23/13 1434  BP: 129/66  Pulse: 54  Temp: 97.3 F (36.3 C)  Resp: 20   Filed Weights   10/23/13 1434  Weight: 202 lb (91.627 kg)    GENERAL:alert, no distress and comfortable. The patient is mildly cushingoid SKIN: skin color, texture, turgor are normal, no rashes or significant lesions EYES: normal, Conjunctiva are pink and non-injected, sclera clear OROPHARYNX:no exudate, no erythema, noted a blister on the lower lip which is healing   NECK: supple, thyroid normal size, non-tender, without nodularity LYMPH:  no palpable lymphadenopathy in the cervical, axillary or inguinal LUNGS: clear to auscultation and percussion with normal breathing effort HEART: regular rate & rhythm and no murmurs and no lower extremity edema ABDOMEN:abdomen soft, non-tender and normal bowel sounds Musculoskeletal:no cyanosis of digits and no clubbing  NEURO: alert & oriented x  3 with fluent speech, no focal motor/sensory deficits  LABORATORY DATA:  I have reviewed the data as listed    Component Value Date/Time   NA 141 10/23/2013 1353   NA 135 02/09/2013 0423   K 4.2 10/23/2013 1353   K 4.1 02/09/2013 0423   CL 102 04/09/2013 1347   CL 97 02/09/2013 0423   CO2 25 10/23/2013 1353   CO2 28 02/09/2013 0423   GLUCOSE 116 10/23/2013 1353   GLUCOSE 113* 04/09/2013 1347   GLUCOSE 101* 02/09/2013 0423   BUN 21.2 10/23/2013 1353   BUN 22 02/09/2013 0423   CREATININE 0.8 10/23/2013 1353   CREATININE 0.89 02/09/2013 0423   CALCIUM 9.3 10/23/2013 1353   CALCIUM 8.9 02/09/2013 0423   PROT 7.6 10/23/2013 1353   PROT 8.0 01/26/2013 1018   ALBUMIN 4.0 10/23/2013 1353   ALBUMIN 3.8 01/26/2013 1018   AST 16 10/23/2013 1353   AST 17 01/26/2013 1018   ALT 15 10/23/2013 1353   ALT 17 01/26/2013 1018   ALKPHOS 60 10/23/2013 1353   ALKPHOS 63 01/26/2013 1018   BILITOT 0.89 10/23/2013 1353   BILITOT 0.9 02/09/2013 0423   GFRNONAA 87* 02/09/2013 0423   GFRAA >90 02/09/2013 0423    No results found for this basename: SPEP,  UPEP,   kappa and lambda light chains    Lab Results  Component Value Date   WBC 8.4 10/23/2013   NEUTROABS 6.3 10/23/2013   HGB 8.6* 10/23/2013   HCT 26.8* 10/23/2013   MCV 77.2* 10/23/2013   PLT 73 Large & giant platelets* 10/23/2013      Chemistry      Component Value Date/Time   NA 141 10/23/2013 1353   NA 135 02/09/2013 0423   K 4.2 10/23/2013 1353   K 4.1 02/09/2013 0423   CL 102 04/09/2013 1347   CL 97 02/09/2013 0423   CO2 25 10/23/2013 1353   CO2 28 02/09/2013 0423   BUN 21.2 10/23/2013 1353   BUN 22 02/09/2013 0423   CREATININE 0.8 10/23/2013 1353   CREATININE 0.89 02/09/2013 0423      Component Value Date/Time   CALCIUM 9.3 10/23/2013 1353   CALCIUM 8.9 02/09/2013 0423   ALKPHOS 60 10/23/2013 1353   ALKPHOS 63 01/26/2013 1018   AST 16 10/23/2013 1353   AST 17 01/26/2013 1018   ALT 15 10/23/2013 1353   ALT 17 01/26/2013 1018   BILITOT 0.89 10/23/2013 1353    BILITOT 0.9 02/09/2013 0423     ASSESSMENT & PLAN:  #1 myelodysplastic syndrome We discussed about the possibility of increasing the dose of Aranesp, increase his dose of prednisone, or start Vidaza. We discussed about the risks and benefits of each option. At present time he would like to try higher dose of prednisone. Currently he is on 7.5 mg daily and we plan to increase to 15 mg daily. #2 anemia This is likely anemia related to his underlying disease. The patient denies recent history of bleeding such as epistaxis, hematuria or hematochezia. He is asymptomatic from the anemia. We will observe for now.  He does not require transfusion now. We will continue on current dose Aranesp #3 thrombocytopenia  This is likely due to his underlying disease. The patient denies recent history of bleeding such as epistaxis, hematuria or hematochezia. He is asymptomatic from the low platelet count. I will observe for now.  he does not require transfusion now.  #4 arthritis Hopefully with the increased dose of prednisone that would help with his arthritis as well. #5 cold sore This is improving. He'll continue the topical antiviral cream.  Orders Placed This Encounter  Procedures  . CBC & Diff and Retic    Standing Status: Future     Number of Occurrences:      Standing Expiration Date: 10/23/2014  . Comprehensive metabolic panel    Standing Status: Future     Number of Occurrences:      Standing Expiration Date: 10/23/2014  . CBC with Differential    Standing Status: Standing     Number of Occurrences: 4     Standing Expiration Date: 10/23/2014  . SCHEDULING COMMUNICATION LAB    Lab appointment 15 min.   All questions were answered. The patient knows to call the clinic with any problems, questions or concerns. No barriers to learning was detected.    [facility], Mayar Whittier, MD 10/23/2013 3:16 PM

## 2013-11-22 ENCOUNTER — Ambulatory Visit: Payer: Medicare Other

## 2013-11-22 ENCOUNTER — Encounter: Payer: Self-pay | Admitting: Hematology and Oncology

## 2013-11-22 ENCOUNTER — Telehealth: Payer: Self-pay | Admitting: Hematology and Oncology

## 2013-11-22 ENCOUNTER — Other Ambulatory Visit: Payer: Medicare Other | Admitting: Lab

## 2013-11-22 ENCOUNTER — Ambulatory Visit (HOSPITAL_BASED_OUTPATIENT_CLINIC_OR_DEPARTMENT_OTHER): Payer: Medicare Other | Admitting: Hematology and Oncology

## 2013-11-22 ENCOUNTER — Other Ambulatory Visit (HOSPITAL_BASED_OUTPATIENT_CLINIC_OR_DEPARTMENT_OTHER): Payer: Medicare Other

## 2013-11-22 VITALS — BP 125/58 | HR 48 | Temp 97.3°F | Resp 19 | Ht 68.5 in | Wt 201.9 lb

## 2013-11-22 DIAGNOSIS — D46Z Other myelodysplastic syndromes: Secondary | ICD-10-CM

## 2013-11-22 DIAGNOSIS — D696 Thrombocytopenia, unspecified: Secondary | ICD-10-CM

## 2013-11-22 DIAGNOSIS — D4620 Refractory anemia with excess of blasts, unspecified: Secondary | ICD-10-CM

## 2013-11-22 DIAGNOSIS — D649 Anemia, unspecified: Secondary | ICD-10-CM

## 2013-11-22 LAB — CBC & DIFF AND RETIC
HCT: 27.8 % — ABNORMAL LOW (ref 38.4–49.9)
HGB: 8.8 g/dL — ABNORMAL LOW (ref 13.0–17.1)
Immature Retic Fract: 13.2 % — ABNORMAL HIGH (ref 3.00–10.60)
MCH: 24.7 pg — ABNORMAL LOW (ref 27.2–33.4)
Platelets: 71 10*3/uL — ABNORMAL LOW (ref 140–400)
RDW: 27.3 % — ABNORMAL HIGH (ref 11.0–14.6)
Retic Ct Abs: 57.32 10*3/uL (ref 34.80–93.90)
WBC: 7.2 10*3/uL (ref 4.0–10.3)

## 2013-11-22 LAB — MANUAL DIFFERENTIAL
Basophil: 0 % (ref 0–2)
EOS: 0 % (ref 0–7)
LYMPH: 39 % (ref 14–49)
MONO: 7 % (ref 0–14)
Metamyelocytes: 1 % — ABNORMAL HIGH (ref 0–0)
Myelocytes: 2 % — ABNORMAL HIGH (ref 0–0)
Other Cell: 0 % (ref 0–0)
PLT EST: DECREASED
PROMYELO: 0 % (ref 0–0)
SEG: 49 % (ref 38–77)

## 2013-11-22 LAB — COMPREHENSIVE METABOLIC PANEL (CC13)
ALT: 15 U/L (ref 0–55)
AST: 15 U/L (ref 5–34)
Albumin: 4.1 g/dL (ref 3.5–5.0)
Calcium: 9.6 mg/dL (ref 8.4–10.4)
Chloride: 101 mEq/L (ref 98–109)
Potassium: 4.2 mEq/L (ref 3.5–5.1)

## 2013-11-22 MED ORDER — DARBEPOETIN ALFA-POLYSORBATE 300 MCG/0.6ML IJ SOLN
300.0000 ug | Freq: Once | INTRAMUSCULAR | Status: AC
  Administered 2013-11-22: 300 ug via SUBCUTANEOUS
  Filled 2013-11-22: qty 0.6

## 2013-11-22 NOTE — Progress Notes (Signed)
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    INTERVAL HISTORY: Eric [name] 67 y.o. male returns for further followup. We increased prednisone dose of 15 mg daily. It helped with his arthritis and energy. The patient denies any recent signs or symptoms of bleeding such as spontaneous epistaxis, hematuria or hematochezia. He has good energy level and denies any symptoms of anemia such as profound fatigue, chest pain or shortness of breath on exertion. No recent infection. He denies any recent fever, chills, night sweats or abnormal weight loss  I have reviewed the past medical history, past surgical history, social history and family history with the patient and they are unchanged from previous note.  ALLERGIES:  has No Known Allergies.  MEDICATIONS:  Current Outpatient Prescriptions  Medication Sig Dispense Refill  . acetaminophen (TYLENOL) 500 MG tablet Take 1,000 mg by mouth every 6 (six) hours as needed for pain.      Marland Kitchen alendronate (FOSAMAX) 70 MG tablet Take 70 mg by mouth once a week. Take with a full glass of water on an empty stomach.      Marland Kitchen allopurinol (ZYLOPRIM) 100 MG tablet Take 100 mg by mouth daily.      . Calcium-Vitamin D (CALTRATE 600 PLUS-VIT D PO) Take 1 tablet by mouth 2 (two) times daily.       Marland Kitchen lisinopril-hydrochlorothiazide  (PRINZIDE,ZESTORETIC) 20-25 MG per tablet Take 0.5 tablets by mouth daily.       Marland Kitchen loratadine (CLARITIN) 10 MG tablet Take 10 mg by mouth daily.      . metoprolol (LOPRESSOR) 50 MG tablet Take 0.5 tablets (25 mg total) by mouth 2 (two) times daily.  15 tablet  0  . Omega-3 Fatty Acids (FISH OIL) 1000 MG CAPS Take 2,000 mg by mouth 2 (two) times daily.      . predniSONE (DELTASONE) 5 MG tablet Take 15 mg by mouth daily.       . traMADol (ULTRAM) 50 MG tablet Take 50-100 mg by mouth every 4 (four) hours as needed for pain.       No current facility-administered medications for this visit.    REVIEW OF SYSTEMS:   Constitutional: Denies fevers, chills or abnormal weight loss Eyes: Denies blurriness of vision Ears, nose, mouth, throat, and face: Denies mucositis or sore throat Respiratory: Denies cough, dyspnea or wheezes Cardiovascular: Denies palpitation, chest discomfort or lower extremity swelling Gastrointestinal:  Denies nausea, heartburn or change in bowel habits Skin: Denies abnormal skin rashes Lymphatics: Denies new lymphadenopathy or easy bruising Neurological:Denies numbness, tingling or new weaknesses Behavioral/Psych: Mood is stable, no new changes  All other systems were reviewed with the patient and are negative.  PHYSICAL EXAMINATION: ECOG PERFORMANCE STATUS: 0 - Asymptomatic  Filed Vitals:   11/22/13 0911  BP: 125/58  Pulse: 48  Temp: 97.3 F (36.3 C)  Resp: 19   Filed Weights   11/22/13 0911  Weight: 201 lb 14.4 oz (91.581 kg)    GENERAL:alert,  no distress and comfortable SKIN: skin color, texture, turgor are normal, no rashes or significant lesions EYES: normal, Conjunctiva are pink and non-injected, sclera clear OROPHARYNX:no exudate, no erythema and lips, buccal mucosa, and tongue normal  NECK: supple, thyroid normal size, non-tender, without nodularity LYMPH:  no palpable lymphadenopathy in the cervical, axillary or inguinal LUNGS: clear to auscultation  and percussion with normal breathing effort HEART: regular rate & rhythm and no murmurs and no lower extremity edema ABDOMEN:abdomen soft, non-tender and normal bowel sounds Musculoskeletal:no cyanosis of digits and no clubbing  NEURO: alert & oriented x 3 with fluent speech, no focal motor/sensory deficits  LABORATORY DATA:  I have reviewed the data as listed    Component Value Date/Time   NA 138 11/22/2013 0847   NA 135 02/09/2013 0423   K 4.2 11/22/2013 0847   K 4.1 02/09/2013 0423   CL 102 04/09/2013 1347   CL 97 02/09/2013 0423   CO2 27 11/22/2013 0847   CO2 28 02/09/2013 0423   GLUCOSE 98 11/22/2013 0847   GLUCOSE 113* 04/09/2013 1347   GLUCOSE 101* 02/09/2013 0423   BUN 22.6 11/22/2013 0847   BUN 22 02/09/2013 0423   CREATININE 0.8 11/22/2013 0847   CREATININE 0.89 02/09/2013 0423   CALCIUM 9.6 11/22/2013 0847   CALCIUM 8.9 02/09/2013 0423   PROT 7.4 11/22/2013 0847   PROT 8.0 01/26/2013 1018   ALBUMIN 4.1 11/22/2013 0847   ALBUMIN 3.8 01/26/2013 1018   AST 15 11/22/2013 0847   AST 17 01/26/2013 1018   ALT 15 11/22/2013 0847   ALT 17 01/26/2013 1018   ALKPHOS 48 11/22/2013 0847   ALKPHOS 63 01/26/2013 1018   BILITOT 1.15 11/22/2013 0847   BILITOT 0.9 02/09/2013 0423   GFRNONAA 87* 02/09/2013 0423   GFRAA >90 02/09/2013 0423    No results found for this basename: SPEP,  UPEP,   kappa and lambda light chains    Lab Results  Component Value Date   WBC 7.2 11/22/2013   NEUTROABS 6.3 10/23/2013   HGB 8.8* 11/22/2013   HCT 27.8* 11/22/2013   MCV 78.1* 11/22/2013   PLT 71* 11/22/2013      Chemistry      Component Value Date/Time   NA 138 11/22/2013 0847   NA 135 02/09/2013 0423   K 4.2 11/22/2013 0847   K 4.1 02/09/2013 0423   CL 102 04/09/2013 1347   CL 97 02/09/2013 0423   CO2 27 11/22/2013 0847   CO2 28 02/09/2013 0423   BUN 22.6 11/22/2013 0847   BUN 22 02/09/2013 0423   CREATININE 0.8 11/22/2013 0847   CREATININE 0.89 02/09/2013 0423      Component Value Date/Time   CALCIUM 9.6  11/22/2013 0847   CALCIUM 8.9 02/09/2013 0423   ALKPHOS 48 11/22/2013 0847   ALKPHOS 63 01/26/2013 1018   AST 15 11/22/2013 0847   AST 17 01/26/2013 1018   ALT 15 11/22/2013 0847   ALT 17 01/26/2013 1018   BILITOT 1.15 11/22/2013 0847   BILITOT 0.9 02/09/2013 0423      ASSESSMENT & PLAN:  #1 myelodysplastic syndrome We discussed about the possibility of increasing the dose or frequency of Aranesp, or to start Vidaza. We discussed about the risks and benefits of each option. At present time, I see no improvement of his blood counts with the higher dose of prednisone. I recommend we tapered him back to 7.5 mg every day. The purpose of maintaining on 7.5 mg is not  for myelodysplastic syndrome, rather is for his chronic arthritis.  #2 anemia This is likely anemia related to his underlying disease. The patient denies recent history of bleeding such as epistaxis, hematuria or hematochezia. He is asymptomatic from the anemia. We will observe for now.  He does not require transfusion now. I recommend increasing the frequency of Aranesp to every 3 weeks instead of every 4 weeks to keep a goal of hemoglobin greater than 10. He agreed. #3 thrombocytopenia This is likely due to his underlying disease. The patient denies recent history of bleeding such as epistaxis, hematuria or hematochezia. He is asymptomatic from the low platelet count. I will observe for now.  he does not require transfusion now #4 chronic arthritis He will continue on 7.5 mg prednisone daily. #5 preventive care He ss currently on calcium, vitamin D and recently was started on oral Fosamax to prevent risk of osteoporosis. Orders Placed This Encounter  Procedures  . CBC & Diff and Retic    Standing Status: Standing     Number of Occurrences: 9     Standing Expiration Date: 11/22/2014  . Basic metabolic panel    Standing Status: Standing     Number of Occurrences: 9     Standing Expiration Date: 11/22/2014  . Erythropoietin     Standing Status: Future     Number of Occurrences:      Standing Expiration Date: 11/22/2014   All questions were answered. The patient knows to call the clinic with any problems, questions or concerns. No barriers to learning was detected.    Lynx Goodrich, MD 11/22/2013 10:14 AM

## 2013-11-22 NOTE — Progress Notes (Signed)
HGB 8.8 today.  Aranesp 300 mcg given by desk nurse during office visit.

## 2013-11-22 NOTE — Telephone Encounter (Signed)
appts made per 12/18 POF and Chart Order AVS and CAL given shh

## 2013-11-23 ENCOUNTER — Telehealth: Payer: Self-pay | Admitting: *Deleted

## 2013-11-23 NOTE — Telephone Encounter (Signed)
Nurse from Dr. Ali Lowe office called to ask if pt has received Flu vaccine in our clinic this year.  Informed her of flu shot given in Sept this year.

## 2013-12-13 ENCOUNTER — Ambulatory Visit (HOSPITAL_BASED_OUTPATIENT_CLINIC_OR_DEPARTMENT_OTHER): Payer: Medicare Other

## 2013-12-13 ENCOUNTER — Other Ambulatory Visit (HOSPITAL_BASED_OUTPATIENT_CLINIC_OR_DEPARTMENT_OTHER): Payer: Medicare Other

## 2013-12-13 VITALS — BP 159/53 | HR 58 | Temp 98.1°F

## 2013-12-13 DIAGNOSIS — D469 Myelodysplastic syndrome, unspecified: Secondary | ICD-10-CM

## 2013-12-13 DIAGNOSIS — D4620 Refractory anemia with excess of blasts, unspecified: Secondary | ICD-10-CM

## 2013-12-13 DIAGNOSIS — D649 Anemia, unspecified: Secondary | ICD-10-CM

## 2013-12-13 LAB — CBC & DIFF AND RETIC
BASO%: 0.2 % (ref 0.0–2.0)
Basophils Absolute: 0 10*3/uL (ref 0.0–0.1)
EOS%: 0 % (ref 0.0–7.0)
Eosinophils Absolute: 0 10*3/uL (ref 0.0–0.5)
HCT: 29 % — ABNORMAL LOW (ref 38.4–49.9)
HGB: 9.3 g/dL — ABNORMAL LOW (ref 13.0–17.1)
Immature Retic Fract: 15.6 % — ABNORMAL HIGH (ref 3.00–10.60)
LYMPH%: 18.5 % (ref 14.0–49.0)
MCH: 25.1 pg — AB (ref 27.2–33.4)
MCHC: 32.1 g/dL (ref 32.0–36.0)
MCV: 78.4 fL — ABNORMAL LOW (ref 79.3–98.0)
MONO#: 1.5 10*3/uL — ABNORMAL HIGH (ref 0.1–0.9)
MONO%: 12.4 % (ref 0.0–14.0)
NEUT#: 8.6 10*3/uL — ABNORMAL HIGH (ref 1.5–6.5)
NEUT%: 68.9 % (ref 39.0–75.0)
NRBC: 0 % (ref 0–0)
Platelets: 100 10*3/uL — ABNORMAL LOW (ref 140–400)
RBC: 3.7 10*6/uL — ABNORMAL LOW (ref 4.20–5.82)
RDW: 27.4 % — ABNORMAL HIGH (ref 11.0–14.6)
RETIC CT ABS: 66.97 10*3/uL (ref 34.80–93.90)
Retic %: 1.81 % — ABNORMAL HIGH (ref 0.80–1.80)
WBC: 12.4 10*3/uL — AB (ref 4.0–10.3)
lymph#: 2.3 10*3/uL (ref 0.9–3.3)

## 2013-12-13 LAB — BASIC METABOLIC PANEL (CC13)
ANION GAP: 11 meq/L (ref 3–11)
BUN: 20.9 mg/dL (ref 7.0–26.0)
CALCIUM: 9.9 mg/dL (ref 8.4–10.4)
CO2: 28 mEq/L (ref 22–29)
CREATININE: 0.8 mg/dL (ref 0.7–1.3)
Chloride: 99 mEq/L (ref 98–109)
Glucose: 130 mg/dl (ref 70–140)
Potassium: 4.1 mEq/L (ref 3.5–5.1)
Sodium: 138 mEq/L (ref 136–145)

## 2013-12-13 LAB — TECHNOLOGIST REVIEW

## 2013-12-13 MED ORDER — DARBEPOETIN ALFA-POLYSORBATE 300 MCG/0.6ML IJ SOLN
300.0000 ug | Freq: Once | INTRAMUSCULAR | Status: AC
  Administered 2013-12-13: 300 ug via SUBCUTANEOUS
  Filled 2013-12-13: qty 0.6

## 2013-12-14 LAB — ERYTHROPOIETIN: ERYTHROPOIETIN: 44 m[IU]/mL — AB (ref 2.6–18.5)

## 2014-01-03 ENCOUNTER — Ambulatory Visit (HOSPITAL_BASED_OUTPATIENT_CLINIC_OR_DEPARTMENT_OTHER): Payer: Medicare Other

## 2014-01-03 ENCOUNTER — Telehealth: Payer: Self-pay | Admitting: Hematology and Oncology

## 2014-01-03 ENCOUNTER — Ambulatory Visit (HOSPITAL_BASED_OUTPATIENT_CLINIC_OR_DEPARTMENT_OTHER): Payer: Medicare Other | Admitting: Hematology and Oncology

## 2014-01-03 ENCOUNTER — Other Ambulatory Visit (HOSPITAL_BASED_OUTPATIENT_CLINIC_OR_DEPARTMENT_OTHER): Payer: Medicare Other

## 2014-01-03 VITALS — BP 134/63 | HR 52 | Temp 98.0°F | Wt 204.0 lb

## 2014-01-03 DIAGNOSIS — D4620 Refractory anemia with excess of blasts, unspecified: Secondary | ICD-10-CM

## 2014-01-03 DIAGNOSIS — D649 Anemia, unspecified: Secondary | ICD-10-CM

## 2014-01-03 DIAGNOSIS — D469 Myelodysplastic syndrome, unspecified: Secondary | ICD-10-CM

## 2014-01-03 DIAGNOSIS — D696 Thrombocytopenia, unspecified: Secondary | ICD-10-CM

## 2014-01-03 DIAGNOSIS — M129 Arthropathy, unspecified: Secondary | ICD-10-CM

## 2014-01-03 LAB — CBC & DIFF AND RETIC
BASO%: 0.1 % (ref 0.0–2.0)
Basophils Absolute: 0 10*3/uL (ref 0.0–0.1)
EOS%: 0 % (ref 0.0–7.0)
Eosinophils Absolute: 0 10*3/uL (ref 0.0–0.5)
HCT: 28.3 % — ABNORMAL LOW (ref 38.4–49.9)
HGB: 9.2 g/dL — ABNORMAL LOW (ref 13.0–17.1)
Immature Retic Fract: 15.4 % — ABNORMAL HIGH (ref 3.00–10.60)
LYMPH%: 28.1 % (ref 14.0–49.0)
MCH: 25.1 pg — AB (ref 27.2–33.4)
MCHC: 32.5 g/dL (ref 32.0–36.0)
MCV: 77.1 fL — AB (ref 79.3–98.0)
MONO#: 0.5 10*3/uL (ref 0.1–0.9)
MONO%: 5.9 % (ref 0.0–14.0)
NEUT%: 65.9 % (ref 39.0–75.0)
NEUTROS ABS: 5 10*3/uL (ref 1.5–6.5)
Platelets: 88 10*3/uL — ABNORMAL LOW (ref 140–400)
RBC: 3.67 10*6/uL — AB (ref 4.20–5.82)
RDW: 26.3 % — ABNORMAL HIGH (ref 11.0–14.6)
RETIC %: 1.39 % (ref 0.80–1.80)
Retic Ct Abs: 51.01 10*3/uL (ref 34.80–93.90)
WBC: 7.7 10*3/uL (ref 4.0–10.3)
lymph#: 2.2 10*3/uL (ref 0.9–3.3)

## 2014-01-03 LAB — BASIC METABOLIC PANEL (CC13)
ANION GAP: 11 meq/L (ref 3–11)
BUN: 17.7 mg/dL (ref 7.0–26.0)
CHLORIDE: 101 meq/L (ref 98–109)
CO2: 26 mEq/L (ref 22–29)
Calcium: 9.4 mg/dL (ref 8.4–10.4)
Creatinine: 0.9 mg/dL (ref 0.7–1.3)
GLUCOSE: 146 mg/dL — AB (ref 70–140)
Potassium: 3.8 mEq/L (ref 3.5–5.1)
Sodium: 138 mEq/L (ref 136–145)

## 2014-01-03 LAB — TECHNOLOGIST REVIEW

## 2014-01-03 MED ORDER — DARBEPOETIN ALFA-POLYSORBATE 500 MCG/ML IJ SOLN
500.0000 ug | Freq: Once | INTRAMUSCULAR | Status: AC
  Administered 2014-01-03: 500 ug via SUBCUTANEOUS
  Filled 2014-01-03: qty 1

## 2014-01-03 NOTE — Telephone Encounter (Signed)
gv pt appt schedule for feb thru april °

## 2014-01-03 NOTE — Progress Notes (Signed)
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    INTERVAL HISTORY: Eric [name] 68 y.o. male returns for further followup. He still has persistent fatigue. He denies any recent fever, chills, night sweats or abnormal weight loss The patient denies any recent signs or symptoms of bleeding such as spontaneous epistaxis, hematuria or hematochezia.  I have reviewed the past medical history, past surgical history, social history and family history with the patient and they are unchanged from previous note.  ALLERGIES:  has No Known Allergies.  MEDICATIONS:  Current Outpatient Prescriptions  Medication Sig Dispense Refill  . acetaminophen (TYLENOL) 500 MG tablet Take 1,000 mg by mouth every 6 (six) hours as needed for pain.      Marland Kitchen alendronate (FOSAMAX) 70 MG tablet Take 70 mg by mouth once a week. Take with a full glass of water on an empty stomach.      Marland Kitchen allopurinol (ZYLOPRIM) 100 MG tablet Take 100 mg by mouth daily.      . Calcium-Vitamin D (CALTRATE 600 PLUS-VIT D PO) Take 1 tablet by mouth 2 (two) times daily.       Marland Kitchen lisinopril-hydrochlorothiazide (PRINZIDE,ZESTORETIC) 20-25 MG per tablet Take 0.5 tablets by mouth daily.       Marland Kitchen loratadine (CLARITIN) 10 MG tablet Take 10 mg by mouth daily.      . metoprolol (LOPRESSOR) 50 MG tablet Take 0.5 tablets (25 mg total) by mouth 2  (two) times daily.  15 tablet  0  . Omega-3 Fatty Acids (FISH OIL) 1000 MG CAPS Take 2,000 mg by mouth 2 (two) times daily.      . predniSONE (DELTASONE) 5 MG tablet Take 10 mg by mouth daily.       . traMADol (ULTRAM) 50 MG tablet Take 50-100 mg by mouth every 4 (four) hours as needed for pain.       No current facility-administered medications for this visit.    REVIEW OF SYSTEMS:   Neurological:Denies numbness, tingling or new weaknesses Behavioral/Psych: Mood is stable, no new changes  All other systems were reviewed with the patient and are negative.  PHYSICAL EXAMINATION: ECOG PERFORMANCE STATUS: 1 - Symptomatic but completely ambulatory  Filed Vitals:   01/03/14 0836  BP: 134/63  Pulse: 52  Temp: 98 F (36.7 C)   Filed Weights   01/03/14 0836  Weight: 204 lb (92.534 kg)    GENERAL:alert, no distress and comfortable SKIN: skin color, texture, turgor are normal, no rashes or significant lesions  Musculoskeletal:no cyanosis of digits and no clubbing  NEURO: alert & oriented x 3 with fluent speech, no focal motor/sensory deficits  LABORATORY DATA:  I have reviewed the data as listed    Component Value Date/Time   NA 138 01/03/2014 0808   NA 135 02/09/2013 0423   K 3.8 01/03/2014 0808   K 4.1 02/09/2013 0423   CL 102 04/09/2013 1347   CL 97 02/09/2013 0423   CO2 26 01/03/2014 0808   CO2  28 02/09/2013 0423   GLUCOSE 146* 01/03/2014 0808   GLUCOSE 113* 04/09/2013 1347   GLUCOSE 101* 02/09/2013 0423   BUN 17.7 01/03/2014 0808   BUN 22 02/09/2013 0423   CREATININE 0.9 01/03/2014 0808   CREATININE 0.89 02/09/2013 0423   CALCIUM 9.4 01/03/2014 0808   CALCIUM 8.9 02/09/2013 0423   PROT 7.4 11/22/2013 0847   PROT 8.0 01/26/2013 1018   ALBUMIN 4.1 11/22/2013 0847   ALBUMIN 3.8 01/26/2013 1018   AST 15 11/22/2013 0847   AST 17 01/26/2013 1018   ALT 15 11/22/2013 0847   ALT 17 01/26/2013 1018   ALKPHOS 48 11/22/2013 0847   ALKPHOS 63 01/26/2013 1018   BILITOT 1.15 11/22/2013 0847   BILITOT  0.9 02/09/2013 0423   GFRNONAA 87* 02/09/2013 0423   GFRAA >90 02/09/2013 0423    No results found for this basename: SPEP,  UPEP,   kappa and lambda light chains    Lab Results  Component Value Date   WBC 7.7 01/03/2014   NEUTROABS 5.0 01/03/2014   HGB 9.2* 01/03/2014   HCT 28.3* 01/03/2014   MCV 77.1* 01/03/2014   PLT 88 Large & giant platelets* 01/03/2014      Chemistry      Component Value Date/Time   NA 138 01/03/2014 0808   NA 135 02/09/2013 0423   K 3.8 01/03/2014 0808   K 4.1 02/09/2013 0423   CL 102 04/09/2013 1347   CL 97 02/09/2013 0423   CO2 26 01/03/2014 0808   CO2 28 02/09/2013 0423   BUN 17.7 01/03/2014 0808   BUN 22 02/09/2013 0423   CREATININE 0.9 01/03/2014 0808   CREATININE 0.89 02/09/2013 0423      Component Value Date/Time   CALCIUM 9.4 01/03/2014 0808   CALCIUM 8.9 02/09/2013 0423   ALKPHOS 48 11/22/2013 0847   ALKPHOS 63 01/26/2013 1018   AST 15 11/22/2013 0847   AST 17 01/26/2013 1018   ALT 15 11/22/2013 0847   ALT 17 01/26/2013 1018   BILITOT 1.15 11/22/2013 0847   BILITOT 0.9 02/09/2013 0423     ASSESSMENT & PLAN:  #1 myelodysplastic syndrome Previously, we discussed about the possibility of increasing the dose or frequency of Aranesp, or to start [place]. We discussed about the risks and benefits of each option. Previously, we increased dose of his prednisone but there were no improvement of his blood counts with the higher dose of prednisone. He is currently back to 7.5 mg every day. The purpose of maintaining on 7.5 mg is not for myelodysplastic syndrome, rather is for his chronic arthritis.  With our last visit, I increased the frequency of Aranesp to every 3 weeks. His hemoglobin has improved from 8 g to 9 g. He still had persistent fatigue. I recommend increasing the dosage of Aranesp to 500 mcg and he agreed to proceed. #2 anemia This is likely anemia related to his underlying disease. The patient denies recent history of bleeding such as epistaxis, hematuria or  hematochezia. He is asymptomatic from the anemia. We will observe for now.  He does not require transfusion now. I recommend increasing the dose of Aranesp to 500 mcg and to keep frequency every 3 weeks to keep a goal of hemoglobin greater than 10. He agreed. #3 thrombocytopenia This is likely due to his underlying disease. The patient denies recent history of bleeding such as epistaxis, hematuria or hematochezia. He is asymptomatic from the low platelet count. I will observe for now.  he  does not require transfusion now #4 chronic arthritis He will continue on 7.5 mg prednisone daily.  Orders Placed This Encounter  Procedures  . CBC & Diff and Retic    Standing Status: Future     Number of Occurrences:      Standing Expiration Date: 01/03/2015  . Erythropoietin    Standing Status: Future     Number of Occurrences:      Standing Expiration Date: 01/03/2015   All questions were answered. The patient knows to call the clinic with any problems, questions or concerns. No barriers to learning was detected.    [place], [place], MD 01/03/2014 9:35 AM

## 2014-01-24 ENCOUNTER — Other Ambulatory Visit (HOSPITAL_BASED_OUTPATIENT_CLINIC_OR_DEPARTMENT_OTHER): Payer: Medicare Other

## 2014-01-24 ENCOUNTER — Ambulatory Visit (HOSPITAL_BASED_OUTPATIENT_CLINIC_OR_DEPARTMENT_OTHER): Payer: Medicare Other

## 2014-01-24 VITALS — BP 142/44 | HR 56 | Temp 97.9°F

## 2014-01-24 DIAGNOSIS — D649 Anemia, unspecified: Secondary | ICD-10-CM

## 2014-01-24 DIAGNOSIS — D46Z Other myelodysplastic syndromes: Secondary | ICD-10-CM

## 2014-01-24 DIAGNOSIS — D4620 Refractory anemia with excess of blasts, unspecified: Secondary | ICD-10-CM

## 2014-01-24 LAB — CBC & DIFF AND RETIC
BASO%: 0.3 % (ref 0.0–2.0)
Basophils Absolute: 0 10*3/uL (ref 0.0–0.1)
EOS%: 0 % (ref 0.0–7.0)
Eosinophils Absolute: 0 10*3/uL (ref 0.0–0.5)
HEMATOCRIT: 27.5 % — AB (ref 38.4–49.9)
HEMOGLOBIN: 8.7 g/dL — AB (ref 13.0–17.1)
Immature Retic Fract: 19.2 % — ABNORMAL HIGH (ref 3.00–10.60)
LYMPH%: 27.5 % (ref 14.0–49.0)
MCH: 24.7 pg — AB (ref 27.2–33.4)
MCHC: 31.6 g/dL — ABNORMAL LOW (ref 32.0–36.0)
MCV: 78.1 fL — AB (ref 79.3–98.0)
MONO#: 0.5 10*3/uL (ref 0.1–0.9)
MONO%: 7.2 % (ref 0.0–14.0)
NEUT#: 4.8 10*3/uL (ref 1.5–6.5)
NEUT%: 65 % (ref 39.0–75.0)
Platelets: 75 10*3/uL — ABNORMAL LOW (ref 140–400)
RBC: 3.52 10*6/uL — ABNORMAL LOW (ref 4.20–5.82)
RDW: 27.2 % — ABNORMAL HIGH (ref 11.0–14.6)
Retic %: 1.95 % — ABNORMAL HIGH (ref 0.80–1.80)
Retic Ct Abs: 68.64 10*3/uL (ref 34.80–93.90)
WBC: 7.3 10*3/uL (ref 4.0–10.3)
lymph#: 2 10*3/uL (ref 0.9–3.3)
nRBC: 0 % (ref 0–0)

## 2014-01-24 LAB — BASIC METABOLIC PANEL (CC13)
Anion Gap: 11 mEq/L (ref 3–11)
BUN: 20.5 mg/dL (ref 7.0–26.0)
CHLORIDE: 108 meq/L (ref 98–109)
CO2: 24 mEq/L (ref 22–29)
Calcium: 9.2 mg/dL (ref 8.4–10.4)
Creatinine: 0.8 mg/dL (ref 0.7–1.3)
GLUCOSE: 144 mg/dL — AB (ref 70–140)
POTASSIUM: 4 meq/L (ref 3.5–5.1)
Sodium: 143 mEq/L (ref 136–145)

## 2014-01-24 LAB — TECHNOLOGIST REVIEW: Technologist Review: 2

## 2014-01-24 MED ORDER — DARBEPOETIN ALFA-POLYSORBATE 500 MCG/ML IJ SOLN
500.0000 ug | Freq: Once | INTRAMUSCULAR | Status: AC
  Administered 2014-01-24: 500 ug via SUBCUTANEOUS
  Filled 2014-01-24: qty 1

## 2014-02-06 ENCOUNTER — Encounter: Payer: Self-pay | Admitting: Interventional Cardiology

## 2014-02-14 ENCOUNTER — Ambulatory Visit (HOSPITAL_BASED_OUTPATIENT_CLINIC_OR_DEPARTMENT_OTHER): Payer: Medicare Other

## 2014-02-14 ENCOUNTER — Other Ambulatory Visit (HOSPITAL_BASED_OUTPATIENT_CLINIC_OR_DEPARTMENT_OTHER): Payer: Medicare Other

## 2014-02-14 VITALS — BP 133/66 | HR 59 | Temp 98.0°F

## 2014-02-14 DIAGNOSIS — D469 Myelodysplastic syndrome, unspecified: Secondary | ICD-10-CM

## 2014-02-14 DIAGNOSIS — D649 Anemia, unspecified: Secondary | ICD-10-CM

## 2014-02-14 DIAGNOSIS — D4620 Refractory anemia with excess of blasts, unspecified: Secondary | ICD-10-CM

## 2014-02-14 LAB — TECHNOLOGIST REVIEW

## 2014-02-14 LAB — CBC & DIFF AND RETIC
BASO%: 0.2 % (ref 0.0–2.0)
BASOS ABS: 0 10*3/uL (ref 0.0–0.1)
EOS%: 0 % (ref 0.0–7.0)
Eosinophils Absolute: 0 10*3/uL (ref 0.0–0.5)
HEMATOCRIT: 30 % — AB (ref 38.4–49.9)
HGB: 9.6 g/dL — ABNORMAL LOW (ref 13.0–17.1)
Immature Retic Fract: 11.4 % — ABNORMAL HIGH (ref 3.00–10.60)
LYMPH%: 25 % (ref 14.0–49.0)
MCH: 24.7 pg — AB (ref 27.2–33.4)
MCHC: 32 g/dL (ref 32.0–36.0)
MCV: 77.3 fL — ABNORMAL LOW (ref 79.3–98.0)
MONO#: 0.6 10*3/uL (ref 0.1–0.9)
MONO%: 6.1 % (ref 0.0–14.0)
NEUT#: 6.8 10*3/uL — ABNORMAL HIGH (ref 1.5–6.5)
NEUT%: 68.7 % (ref 39.0–75.0)
PLATELETS: 83 10*3/uL — AB (ref 140–400)
RBC: 3.88 10*6/uL — ABNORMAL LOW (ref 4.20–5.82)
RDW: 27.2 % — ABNORMAL HIGH (ref 11.0–14.6)
Retic %: 1.55 % (ref 0.80–1.80)
Retic Ct Abs: 60.14 10*3/uL (ref 34.80–93.90)
WBC: 9.9 10*3/uL (ref 4.0–10.3)
lymph#: 2.5 10*3/uL (ref 0.9–3.3)

## 2014-02-14 LAB — BASIC METABOLIC PANEL (CC13)
Anion Gap: 13 mEq/L — ABNORMAL HIGH (ref 3–11)
BUN: 19.6 mg/dL (ref 7.0–26.0)
CHLORIDE: 105 meq/L (ref 98–109)
CO2: 24 meq/L (ref 22–29)
Calcium: 9.1 mg/dL (ref 8.4–10.4)
Creatinine: 0.9 mg/dL (ref 0.7–1.3)
Glucose: 122 mg/dl (ref 70–140)
Potassium: 4 mEq/L (ref 3.5–5.1)
SODIUM: 142 meq/L (ref 136–145)

## 2014-02-14 MED ORDER — DARBEPOETIN ALFA-POLYSORBATE 500 MCG/ML IJ SOLN
500.0000 ug | Freq: Once | INTRAMUSCULAR | Status: AC
  Administered 2014-02-14: 500 ug via SUBCUTANEOUS
  Filled 2014-02-14: qty 1

## 2014-02-25 ENCOUNTER — Other Ambulatory Visit: Payer: Self-pay | Admitting: Interventional Cardiology

## 2014-03-07 ENCOUNTER — Other Ambulatory Visit (HOSPITAL_BASED_OUTPATIENT_CLINIC_OR_DEPARTMENT_OTHER): Payer: Medicare Other

## 2014-03-07 ENCOUNTER — Ambulatory Visit (HOSPITAL_BASED_OUTPATIENT_CLINIC_OR_DEPARTMENT_OTHER): Payer: Medicare Other | Admitting: Hematology and Oncology

## 2014-03-07 ENCOUNTER — Encounter: Payer: Self-pay | Admitting: Hematology and Oncology

## 2014-03-07 ENCOUNTER — Telehealth: Payer: Self-pay | Admitting: Hematology and Oncology

## 2014-03-07 ENCOUNTER — Ambulatory Visit: Payer: Medicare Other

## 2014-03-07 VITALS — BP 133/55 | HR 62 | Temp 97.4°F | Resp 20 | Ht 68.5 in | Wt 203.0 lb

## 2014-03-07 DIAGNOSIS — D649 Anemia, unspecified: Secondary | ICD-10-CM

## 2014-03-07 DIAGNOSIS — D696 Thrombocytopenia, unspecified: Secondary | ICD-10-CM

## 2014-03-07 DIAGNOSIS — D4620 Refractory anemia with excess of blasts, unspecified: Secondary | ICD-10-CM

## 2014-03-07 DIAGNOSIS — R519 Headache, unspecified: Secondary | ICD-10-CM

## 2014-03-07 DIAGNOSIS — R51 Headache: Secondary | ICD-10-CM

## 2014-03-07 DIAGNOSIS — M069 Rheumatoid arthritis, unspecified: Secondary | ICD-10-CM

## 2014-03-07 HISTORY — DX: Headache, unspecified: R51.9

## 2014-03-07 LAB — BASIC METABOLIC PANEL (CC13)
Anion Gap: 14 mEq/L — ABNORMAL HIGH (ref 3–11)
BUN: 24.4 mg/dL (ref 7.0–26.0)
CO2: 23 meq/L (ref 22–29)
Calcium: 9.5 mg/dL (ref 8.4–10.4)
Chloride: 106 mEq/L (ref 98–109)
Creatinine: 0.9 mg/dL (ref 0.7–1.3)
GLUCOSE: 172 mg/dL — AB (ref 70–140)
POTASSIUM: 4 meq/L (ref 3.5–5.1)
Sodium: 142 mEq/L (ref 136–145)

## 2014-03-07 LAB — CBC & DIFF AND RETIC
BASO%: 0.2 % (ref 0.0–2.0)
BASOS ABS: 0 10*3/uL (ref 0.0–0.1)
EOS%: 0 % (ref 0.0–7.0)
Eosinophils Absolute: 0 10*3/uL (ref 0.0–0.5)
HEMATOCRIT: 29.4 % — AB (ref 38.4–49.9)
HEMOGLOBIN: 9.3 g/dL — AB (ref 13.0–17.1)
Immature Retic Fract: 11.1 % — ABNORMAL HIGH (ref 3.00–10.60)
LYMPH%: 20.6 % (ref 14.0–49.0)
MCH: 24.6 pg — ABNORMAL LOW (ref 27.2–33.4)
MCHC: 31.6 g/dL — AB (ref 32.0–36.0)
MCV: 77.8 fL — AB (ref 79.3–98.0)
MONO#: 0.8 10*3/uL (ref 0.1–0.9)
MONO%: 8.6 % (ref 0.0–14.0)
NEUT#: 6.6 10*3/uL — ABNORMAL HIGH (ref 1.5–6.5)
NEUT%: 70.6 % (ref 39.0–75.0)
PLATELETS: 82 10*3/uL — AB (ref 140–400)
RBC: 3.78 10*6/uL — ABNORMAL LOW (ref 4.20–5.82)
RDW: 27.9 % — ABNORMAL HIGH (ref 11.0–14.6)
Retic %: 1.34 % (ref 0.80–1.80)
Retic Ct Abs: 50.65 10*3/uL (ref 34.80–93.90)
WBC: 9.4 10*3/uL (ref 4.0–10.3)
lymph#: 1.9 10*3/uL (ref 0.9–3.3)
nRBC: 0 % (ref 0–0)

## 2014-03-07 LAB — TECHNOLOGIST REVIEW

## 2014-03-07 MED ORDER — DARBEPOETIN ALFA-POLYSORBATE 500 MCG/ML IJ SOLN
500.0000 ug | Freq: Once | INTRAMUSCULAR | Status: AC
  Administered 2014-03-07: 500 ug via SUBCUTANEOUS
  Filled 2014-03-07: qty 1

## 2014-03-07 NOTE — Telephone Encounter (Signed)
lmonvm advising the pt of his next lab and injection appt in April and to pick up the rest of his schedules at that time.

## 2014-03-07 NOTE — Progress Notes (Signed)
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   The patient is placed on Aranesp 500 mcg every 3 weeks INTERVAL HISTORY: Eric [name] 68 y.o. male returns for further followup. The patient fell recently about a month ago, causing some head stiffness. He denies any loss of consciousness. He complained of severe neck pain but it is improving. With his last injection, he also complained of severe headache lasting for about a week. He denies any high blood pressure after the injection. The patient denies any recent signs or symptoms of bleeding such as spontaneous epistaxis, hematuria or hematochezia. He denies any recent fever, chills, night sweats or abnormal weight loss   I have reviewed the past medical history, past surgical history, social history and family history with the patient and they are unchanged from previous note.  ALLERGIES:  has No Known Allergies.  MEDICATIONS:  Current Outpatient Prescriptions  Medication Sig Dispense Refill  . acetaminophen (TYLENOL) 500 MG tablet Take 1,000 mg by mouth every 6 (six) hours as needed for pain.      Marland Kitchen alendronate (FOSAMAX) 70 MG tablet Take 70 mg by mouth once a week. Take with a full glass of water on an empty stomach.      Marland Kitchen allopurinol (ZYLOPRIM) 100 MG tablet Take 100 mg by mouth daily.      . Calcium-Vitamin D (CALTRATE  600 PLUS-VIT D PO) Take 1 tablet by mouth 2 (two) times daily.       . cyclobenzaprine (FLEXERIL) 10 MG tablet Take 10 mg by mouth as needed.      Marland Kitchen lisinopril-hydrochlorothiazide (PRINZIDE,ZESTORETIC) 20-25 MG per tablet Take 0.5 tablets by mouth daily.       Marland Kitchen loratadine (CLARITIN) 10 MG tablet Take 10 mg by mouth daily.      . metoprolol (LOPRESSOR) 50 MG tablet take 1/2 tablet by mouth twice a day  30 tablet  3  . Omega-3 Fatty Acids (FISH OIL) 1000 MG CAPS Take 2,000 mg by mouth 2 (two) times daily.      . predniSONE (DELTASONE) 5 MG tablet Take 10 mg by mouth daily.       . traMADol (ULTRAM) 50 MG tablet Take 50-100 mg by mouth every 4 (four) hours as needed for pain.       No current facility-administered medications for this visit.    REVIEW OF SYSTEMS:   Constitutional: Denies fevers, chills or abnormal weight loss Eyes: Denies blurriness of vision Ears, nose, mouth, throat, and face: Denies mucositis or sore throat Respiratory: Denies cough, dyspnea or wheezes Cardiovascular: Denies palpitation, chest discomfort or lower extremity swelling Gastrointestinal:  Denies nausea, heartburn or change in bowel habits Skin: Denies abnormal skin rashes Lymphatics: Denies new lymphadenopathy or easy bruising Neurological:Denies numbness, tingling or new weaknesses Behavioral/Psych: Mood is stable, no new changes  All other systems were reviewed with the patient and are negative.  PHYSICAL EXAMINATION: ECOG PERFORMANCE STATUS: 0 - Asymptomatic  Filed Vitals:   03/07/14 6122  BP: 133/55  Pulse: 62  Temp: 97.4 F (36.3 C)  Resp: 20   Filed Weights   03/07/14 0855  Weight: 203 lb (92.08 kg)    GENERAL:alert, no distress and comfortable. He is mildly cushingoid SKIN: skin color, texture, turgor are normal, no rashes or significant lesions EYES: normal, Conjunctiva are pink and non-injected, sclera clear Musculoskeletal:no cyanosis of digits and no clubbing  NEURO: alert &  oriented x 3 with fluent speech, no focal motor/sensory deficits  LABORATORY DATA:  I have reviewed the data as listed    Component Value Date/Time   NA 142 02/14/2014 0805   NA 135 02/09/2013 0423   K 4.0 02/14/2014 0805   K 4.1 02/09/2013 0423   CL 102 04/09/2013 1347   CL 97 02/09/2013 0423   CO2 24 02/14/2014 0805   CO2 28 02/09/2013 0423   GLUCOSE 122 02/14/2014 0805   GLUCOSE 113* 04/09/2013 1347   GLUCOSE 101* 02/09/2013 0423   BUN 19.6 02/14/2014 0805   BUN 22 02/09/2013 0423   CREATININE 0.9 02/14/2014 0805   CREATININE 0.89 02/09/2013 0423   CALCIUM 9.1 02/14/2014 0805   CALCIUM 8.9 02/09/2013 0423   PROT 7.4 11/22/2013 0847   PROT 8.0 01/26/2013 1018   ALBUMIN 4.1 11/22/2013 0847   ALBUMIN 3.8 01/26/2013 1018   AST 15 11/22/2013 0847   AST 17 01/26/2013 1018   ALT 15 11/22/2013 0847   ALT 17 01/26/2013 1018   ALKPHOS 48 11/22/2013 0847   ALKPHOS 63 01/26/2013 1018   BILITOT 1.15 11/22/2013 0847   BILITOT 0.9 02/09/2013 0423   GFRNONAA 87* 02/09/2013 0423   GFRAA >90 02/09/2013 0423    No results found for this basename: SPEP,  UPEP,   kappa and lambda light chains    Lab Results  Component Value Date   WBC 9.4 03/07/2014   NEUTROABS 6.6* 03/07/2014   HGB 9.3* 03/07/2014   HCT 29.4* 03/07/2014   MCV 77.8* 03/07/2014   PLT 82* 03/07/2014      Chemistry      Component Value Date/Time   NA 142 02/14/2014 0805   NA 135 02/09/2013 0423   K 4.0 02/14/2014 0805   K 4.1 02/09/2013 0423   CL 102 04/09/2013 1347   CL 97 02/09/2013 0423   CO2 24 02/14/2014 0805   CO2 28 02/09/2013 0423   BUN 19.6 02/14/2014 0805   BUN 22 02/09/2013 0423   CREATININE 0.9 02/14/2014 0805   CREATININE 0.89 02/09/2013 0423      Component Value Date/Time   CALCIUM 9.1 02/14/2014 0805   CALCIUM 8.9 02/09/2013 0423   ALKPHOS 48 11/22/2013 0847   ALKPHOS 63 01/26/2013 1018   AST 15 11/22/2013 0847   AST 17 01/26/2013 1018   ALT 15 11/22/2013 0847   ALT 17 01/26/2013 1018   BILITOT 1.15 11/22/2013 0847   BILITOT 0.9 02/09/2013 0423      ASSESSMENT & PLAN:  #1 myelodysplastic syndrome Previously, we discussed about the possibility of increasing the dose or frequency of Aranesp, or to start Vidaza. We discussed about the risks and benefits of each option. Previously, we increased dose of his prednisone but there were no improvement of his blood counts with the higher dose of prednisone. He is currently back to 7.5 mg every day. The purpose of maintaining on 7.5 mg is not for myelodysplastic syndrome, rather is for his chronic arthritis.  With our last visit, I increased the frequency of Aranesp to every  3 weeks and dose to 500 mcg. His hemoglobin has improved from 8 g to 9 g. He still had persistent fatigue. The patient is comfortable with the current dosage. He is not symptomatic with a hemoglobin less than 10 g. #2 anemia This is likely anemia related to his underlying disease. The patient denies recent history of bleeding such as epistaxis, hematuria or hematochezia. He is asymptomatic from the anemia. We will observe for now.  He does not require transfusion now. I recommend increasing the dose of Aranesp to 500 mcg and to keep frequency every 3 weeks to keep a goal of hemoglobin greater than 10. He agreed. #3 thrombocytopenia This is likely due to his underlying disease. The patient denies recent history of bleeding such as epistaxis, hematuria or hematochezia. He is asymptomatic from the low platelet count. I will observe for now.  he does not require transfusion now #4 chronic arthritis He will continue on 7.5 mg prednisone daily. #5 recent headache I think the majority of his headache was related to recent fall. Certainly Aranesp can cause headache but it is usually associated with high blood pressure. I recommend observation only. If his headache is persistent, I recommend ordering imaging study of his head. The patient is on chronic prednisone and could have osteoporosis. Some of the head it could be related to  undiagnosed bone fracture. All questions were answered. The patient knows to call the clinic with any problems, questions or concerns. No barriers to learning was detected. I spent 25 minutes counseling the patient face to face. The total time spent in the appointment was 30 minutes and more than 50% was on counseling and review of test results     [facility], [place], MD 03/07/2014 9:27 AM

## 2014-03-11 LAB — ERYTHROPOIETIN: Erythropoietin: 20.4 m[IU]/mL — ABNORMAL HIGH (ref 2.6–18.5)

## 2014-03-14 ENCOUNTER — Ambulatory Visit: Payer: Medicare Other | Admitting: Interventional Cardiology

## 2014-03-28 ENCOUNTER — Other Ambulatory Visit: Payer: Self-pay | Admitting: Hematology and Oncology

## 2014-03-28 ENCOUNTER — Other Ambulatory Visit (HOSPITAL_BASED_OUTPATIENT_CLINIC_OR_DEPARTMENT_OTHER): Payer: Medicare Other

## 2014-03-28 ENCOUNTER — Ambulatory Visit (HOSPITAL_BASED_OUTPATIENT_CLINIC_OR_DEPARTMENT_OTHER): Payer: Medicare Other

## 2014-03-28 VITALS — BP 143/71 | HR 52 | Temp 98.0°F

## 2014-03-28 DIAGNOSIS — D649 Anemia, unspecified: Secondary | ICD-10-CM

## 2014-03-28 DIAGNOSIS — D4620 Refractory anemia with excess of blasts, unspecified: Secondary | ICD-10-CM

## 2014-03-28 LAB — CBC & DIFF AND RETIC
BASO%: 0.3 % (ref 0.0–2.0)
Basophils Absolute: 0 10*3/uL (ref 0.0–0.1)
EOS%: 0 % (ref 0.0–7.0)
Eosinophils Absolute: 0 10*3/uL (ref 0.0–0.5)
HCT: 28.8 % — ABNORMAL LOW (ref 38.4–49.9)
HGB: 9.3 g/dL — ABNORMAL LOW (ref 13.0–17.1)
Immature Retic Fract: 20 % — ABNORMAL HIGH (ref 3.00–10.60)
LYMPH#: 2.1 10*3/uL (ref 0.9–3.3)
LYMPH%: 22.4 % (ref 14.0–49.0)
MCH: 24.7 pg — AB (ref 27.2–33.4)
MCHC: 32.3 g/dL (ref 32.0–36.0)
MCV: 76.6 fL — ABNORMAL LOW (ref 79.3–98.0)
MONO#: 0.9 10*3/uL (ref 0.1–0.9)
MONO%: 9.3 % (ref 0.0–14.0)
NEUT#: 6.3 10*3/uL (ref 1.5–6.5)
NEUT%: 68 % (ref 39.0–75.0)
Platelets: 69 10*3/uL — ABNORMAL LOW (ref 140–400)
RBC: 3.76 10*6/uL — AB (ref 4.20–5.82)
RDW: 26.5 % — ABNORMAL HIGH (ref 11.0–14.6)
RETIC %: 1.63 % (ref 0.80–1.80)
Retic Ct Abs: 61.29 10*3/uL (ref 34.80–93.90)
WBC: 9.2 10*3/uL (ref 4.0–10.3)

## 2014-03-28 LAB — BASIC METABOLIC PANEL (CC13)
Anion Gap: 15 mEq/L — ABNORMAL HIGH (ref 3–11)
BUN: 17.4 mg/dL (ref 7.0–26.0)
CO2: 23 mEq/L (ref 22–29)
Calcium: 9.3 mg/dL (ref 8.4–10.4)
Chloride: 103 mEq/L (ref 98–109)
Creatinine: 0.8 mg/dL (ref 0.7–1.3)
Glucose: 162 mg/dl — ABNORMAL HIGH (ref 70–140)
Potassium: 3.6 mEq/L (ref 3.5–5.1)
SODIUM: 141 meq/L (ref 136–145)

## 2014-03-28 LAB — TECHNOLOGIST REVIEW

## 2014-03-28 MED ORDER — DARBEPOETIN ALFA-POLYSORBATE 500 MCG/ML IJ SOLN
500.0000 ug | Freq: Once | INTRAMUSCULAR | Status: AC
  Administered 2014-03-28: 500 ug via SUBCUTANEOUS
  Filled 2014-03-28: qty 1

## 2014-03-28 NOTE — Patient Instructions (Signed)
Darbepoetin Alfa injection What is this medicine? DARBEPOETIN ALFA (dar be POE e tin AL fa) helps your body make more red blood cells. It is used to treat anemia caused by chronic kidney failure and chemotherapy. This medicine may be used for other purposes; ask your health care provider or pharmacist if you have questions. COMMON BRAND NAME(S): Aranesp What should I tell my health care provider before I take this medicine? They need to know if you have any of these conditions: -blood clotting disorders or history of blood clots -cancer patient not on chemotherapy -cystic fibrosis -heart disease, such as angina, heart failure, or a history of a heart attack -hemoglobin level of 12 g/dL or greater -high blood pressure -low levels of folate, iron, or vitamin B12 -seizures -an unusual or allergic reaction to darbepoetin, erythropoietin, albumin, hamster proteins, latex, other medicines, foods, dyes, or preservatives -pregnant or trying to get pregnant -breast-feeding How should I use this medicine? This medicine is for injection into a vein or under the skin. It is usually given by a health care professional in a hospital or clinic setting. If you get this medicine at home, you will be taught how to prepare and give this medicine. Do not shake the solution before you withdraw a dose. Use exactly as directed. Take your medicine at regular intervals. Do not take your medicine more often than directed. It is important that you put your used needles and syringes in a special sharps container. Do not put them in a trash can. If you do not have a sharps container, call your pharmacist or healthcare provider to get one. Talk to your pediatrician regarding the use of this medicine in children. While this medicine may be used in children as young as 1 year for selected conditions, precautions do apply. Overdosage: If you think you have taken too much of this medicine contact a poison control center or  emergency room at once. NOTE: This medicine is only for you. Do not share this medicine with others. What if I miss a dose? If you miss a dose, take it as soon as you can. If it is almost time for your next dose, take only that dose. Do not take double or extra doses. What may interact with this medicine? Do not take this medicine with any of the following medications: -epoetin alfa This list may not describe all possible interactions. Give your health care provider a list of all the medicines, herbs, non-prescription drugs, or dietary supplements you use. Also tell them if you smoke, drink alcohol, or use illegal drugs. Some items may interact with your medicine. What should I watch for while using this medicine? Visit your prescriber or health care professional for regular checks on your progress and for the needed blood tests and blood pressure measurements. It is especially important for the doctor to make sure your hemoglobin level is in the desired range, to limit the risk of potential side effects and to give you the best benefit. Keep all appointments for any recommended tests. Check your blood pressure as directed. Ask your doctor what your blood pressure should be and when you should contact him or her. As your body makes more red blood cells, you may need to take iron, folic acid, or vitamin B supplements. Ask your doctor or health care provider which products are right for you. If you have kidney disease continue dietary restrictions, even though this medication can make you feel better. Talk with your doctor or health   care professional about the foods you eat and the vitamins that you take. What side effects may I notice from receiving this medicine? Side effects that you should report to your doctor or health care professional as soon as possible: -allergic reactions like skin rash, itching or hives, swelling of the face, lips, or tongue -breathing problems -changes in vision -chest  pain -confusion, trouble speaking or understanding -feeling faint or lightheaded, falls -high blood pressure -muscle aches or pains -pain, swelling, warmth in the leg -rapid weight gain -severe headaches -sudden numbness or weakness of the face, arm or leg -trouble walking, dizziness, loss of balance or coordination -seizures (convulsions) -swelling of the ankles, feet, hands -unusually weak or tired Side effects that usually do not require medical attention (report to your doctor or health care professional if they continue or are bothersome): -diarrhea -fever, chills (flu-like symptoms) -headaches -nausea, vomiting -redness, stinging, or swelling at site where injected This list may not describe all possible side effects. Call your doctor for medical advice about side effects. You may report side effects to FDA at 1-800-FDA-1088. Where should I keep my medicine? Keep out of the reach of children. Store in a refrigerator between 2 and 8 degrees C (36 and 46 degrees F). Do not freeze. Do not shake. Throw away any unused portion if using a single-dose vial. Throw away any unused medicine after the expiration date. NOTE: This sheet is a summary. It may not cover all possible information. If you have questions about this medicine, talk to your doctor, pharmacist, or health care provider.  2014, [employer] Standard. (2008-11-05 10:23:57)  

## 2014-04-08 ENCOUNTER — Encounter: Payer: Self-pay | Admitting: Interventional Cardiology

## 2014-04-08 ENCOUNTER — Ambulatory Visit (INDEPENDENT_AMBULATORY_CARE_PROVIDER_SITE_OTHER): Payer: Medicare Other | Admitting: Interventional Cardiology

## 2014-04-08 VITALS — BP 113/61 | HR 60 | Ht 68.5 in | Wt 204.0 lb

## 2014-04-08 DIAGNOSIS — I498 Other specified cardiac arrhythmias: Secondary | ICD-10-CM

## 2014-04-08 DIAGNOSIS — I471 Supraventricular tachycardia: Secondary | ICD-10-CM | POA: Insufficient documentation

## 2014-04-08 DIAGNOSIS — I10 Essential (primary) hypertension: Secondary | ICD-10-CM

## 2014-04-08 NOTE — Patient Instructions (Signed)
Your physician recommends that you continue on your current medications as directed. Please refer to the Current Medication list given to you today.  Your physician wants you to follow-up in: 1 year with Dr. Varanasi. You will receive a reminder letter in the mail two months in advance. If you don't receive a letter, please call our office to schedule the follow-up appointment.  

## 2014-04-08 NOTE — Progress Notes (Signed)
Patient ID: Eric [name], male   DOB: 02/25/1946, 68 y.o.   MRN: [id number]    [address], [place]  [address] Phone: [phone] Fax:  [phone]  Date:  04/08/2014   ID:  Eric [name], [place] 08/25/1946, MRN [id number]  PCP:  Lilian Coma, MD      History of Present Illness: Eric [name] is a 68 y.o. male who has had SVT and myelodyspastic syndrome. Hbg has been 8.6 in the past. He had TKR preceded by transfusion. He has not had any further SVT sx. 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 He has recovered well from knee surgery.  No further palpitations.  No gout flares and Hbg improving.  No CP or SHOB with activity in the garden.     Wt Readings from Last 3 Encounters:  04/08/14 204 lb (92.534 kg)  03/07/14 203 lb (92.08 kg)  01/03/14 204 lb (92.534 kg)     Past Medical History  Diagnosis Date  . MDS (myelodysplastic syndrome), low grade 2012    on observation; MDS FISH panel on 04/26/2013 was normal; cytogenetics were also negative.   . Rheumatoid arthritis(714.0)     was on MTX until MDS dx  . Hypertension   . Anemia   . Family history of ischemic heart disease   . Shortness of breath     exertional  . Dysrhythmia   . Tachycardia 2013    evaluated by Dr Irish Lack @ [place]  . Gout   . Needs flu shot 08/23/2013  . Headache 03/07/2014    Current Outpatient Prescriptions  Medication Sig Dispense Refill  . acetaminophen (TYLENOL) 500 MG tablet Take 1,000 mg by mouth every 6 (six) hours as needed for pain.      Marland Kitchen alendronate (FOSAMAX) 70 MG tablet Take 70 mg by mouth once a week. Take with a full glass of water on an empty stomach.      Marland Kitchen allopurinol (ZYLOPRIM) 100 MG tablet Take 100 mg by mouth daily.      . Calcium-Vitamin D (CALTRATE 600 PLUS-VIT D PO) Take 1 tablet by mouth 2 (two) times daily.       . cyclobenzaprine (FLEXERIL) 10 MG tablet Take 10 mg by mouth as needed.      Marland Kitchen lisinopril-hydrochlorothiazide (PRINZIDE,ZESTORETIC) 20-25 MG per  tablet Take 0.5 tablets by mouth daily.       Marland Kitchen loratadine (CLARITIN) 10 MG tablet Take 10 mg by mouth daily.      . metoprolol (LOPRESSOR) 50 MG tablet take 1/2 tablet by mouth twice a day  30 tablet  3  . Omega-3 Fatty Acids (FISH OIL) 1000 MG CAPS Take 2,000 mg by mouth 2 (two) times daily.      . predniSONE (DELTASONE) 5 MG tablet Take 10 mg by mouth daily.       . traMADol (ULTRAM) 50 MG tablet Take 50-100 mg by mouth every 4 (four) hours as needed for pain.       No current facility-administered medications for this visit.    Allergies:   No Known Allergies  Social History:  The patient  reports that he quit smoking about 44 years ago. His smoking use included Cigarettes. He smoked 0.00 packs per day. He has never used smokeless tobacco. He reports that he does not drink alcohol or use illicit drugs.   Family History:  The patient's family history is not on file.   ROS:  Please see the history of  present illness.  No nausea, vomiting.  No fevers, chills.  No focal weakness.  No dysuria.    All other systems reviewed and negative.   PHYSICAL EXAM: VS:  BP 113/61  Pulse 60  Ht 5' 8.5" (1.74 m)  Wt 204 lb (92.534 kg)  BMI 30.56 kg/m2 Well nourished, well developed, in no acute distress HEENT: normal Neck: no JVD, no carotid bruits Cardiac:  normal S1, S2; RRR;  Lungs:  clear to auscultation bilaterally, no wheezing, rhonchi or rales Abd: soft, nontender, no hepatomegaly Ext: no edema Skin: warm and dry Neuro:   no focal abnormalities noted  EKG:  Normal    ASSESSMENT AND PLAN:  1. SVT/PAT  Continue Metoprolol Tartrate Tablet, 50 MG, 1/2 tablet, Orally, Twice a day  2. Hypertension, essential  Continue Lisinopril-Hydrochlorothiazide Tablet, 20-25 MG, 1/2 tablet, once a day  3. Unspecified anemia  Notes: No sx from anemia at this time. Can feel s sx at Hgb < 8.5.   Signed, Mina Marble, MD, [facility] 04/08/2014 2:56 PM

## 2014-04-18 ENCOUNTER — Other Ambulatory Visit (HOSPITAL_BASED_OUTPATIENT_CLINIC_OR_DEPARTMENT_OTHER): Payer: Medicare Other

## 2014-04-18 ENCOUNTER — Ambulatory Visit (HOSPITAL_BASED_OUTPATIENT_CLINIC_OR_DEPARTMENT_OTHER): Payer: Medicare Other

## 2014-04-18 VITALS — BP 131/60 | HR 55 | Temp 98.3°F

## 2014-04-18 DIAGNOSIS — D649 Anemia, unspecified: Secondary | ICD-10-CM

## 2014-04-18 DIAGNOSIS — D4620 Refractory anemia with excess of blasts, unspecified: Secondary | ICD-10-CM

## 2014-04-18 LAB — CBC & DIFF AND RETIC
BASO%: 0.2 % (ref 0.0–2.0)
Basophils Absolute: 0 10*3/uL (ref 0.0–0.1)
EOS%: 0 % (ref 0.0–7.0)
Eosinophils Absolute: 0 10*3/uL (ref 0.0–0.5)
HCT: 27.8 % — ABNORMAL LOW (ref 38.4–49.9)
HGB: 9 g/dL — ABNORMAL LOW (ref 13.0–17.1)
IMMATURE RETIC FRACT: 14.6 % — AB (ref 3.00–10.60)
LYMPH%: 18.6 % (ref 14.0–49.0)
MCH: 24.7 pg — ABNORMAL LOW (ref 27.2–33.4)
MCHC: 32.4 g/dL (ref 32.0–36.0)
MCV: 76.4 fL — ABNORMAL LOW (ref 79.3–98.0)
MONO#: 0.7 10*3/uL (ref 0.1–0.9)
MONO%: 6.9 % (ref 0.0–14.0)
NEUT#: 7.1 10*3/uL — ABNORMAL HIGH (ref 1.5–6.5)
NEUT%: 74.3 % (ref 39.0–75.0)
NRBC: 0 % (ref 0–0)
PLATELETS: 64 10*3/uL — AB (ref 140–400)
RBC: 3.64 10*6/uL — AB (ref 4.20–5.82)
RDW: 27.2 % — ABNORMAL HIGH (ref 11.0–14.6)
Retic %: 1.72 % (ref 0.80–1.80)
Retic Ct Abs: 62.61 10*3/uL (ref 34.80–93.90)
WBC: 9.6 10*3/uL (ref 4.0–10.3)
lymph#: 1.8 10*3/uL (ref 0.9–3.3)

## 2014-04-18 LAB — BASIC METABOLIC PANEL (CC13)
Anion Gap: 12 mEq/L — ABNORMAL HIGH (ref 3–11)
BUN: 18.6 mg/dL (ref 7.0–26.0)
CHLORIDE: 104 meq/L (ref 98–109)
CO2: 22 meq/L (ref 22–29)
Calcium: 9.5 mg/dL (ref 8.4–10.4)
Creatinine: 0.8 mg/dL (ref 0.7–1.3)
GLUCOSE: 163 mg/dL — AB (ref 70–140)
Potassium: 3.9 mEq/L (ref 3.5–5.1)
Sodium: 139 mEq/L (ref 136–145)

## 2014-04-18 LAB — TECHNOLOGIST REVIEW: Technologist Review: 1

## 2014-04-18 MED ORDER — DARBEPOETIN ALFA-POLYSORBATE 500 MCG/ML IJ SOLN
500.0000 ug | Freq: Once | INTRAMUSCULAR | Status: AC
  Administered 2014-04-18: 500 ug via SUBCUTANEOUS
  Filled 2014-04-18: qty 1

## 2014-05-09 ENCOUNTER — Ambulatory Visit (HOSPITAL_BASED_OUTPATIENT_CLINIC_OR_DEPARTMENT_OTHER): Payer: Medicare Other

## 2014-05-09 ENCOUNTER — Other Ambulatory Visit (HOSPITAL_BASED_OUTPATIENT_CLINIC_OR_DEPARTMENT_OTHER): Payer: Medicare Other

## 2014-05-09 VITALS — BP 128/57 | HR 56 | Temp 98.4°F | Resp 20

## 2014-05-09 DIAGNOSIS — D4620 Refractory anemia with excess of blasts, unspecified: Secondary | ICD-10-CM

## 2014-05-09 DIAGNOSIS — D649 Anemia, unspecified: Secondary | ICD-10-CM

## 2014-05-09 LAB — CBC & DIFF AND RETIC
BASO%: 0.2 % (ref 0.0–2.0)
Basophils Absolute: 0 10*3/uL (ref 0.0–0.1)
EOS%: 0 % (ref 0.0–7.0)
Eosinophils Absolute: 0 10*3/uL (ref 0.0–0.5)
HEMATOCRIT: 28.4 % — AB (ref 38.4–49.9)
HGB: 9.3 g/dL — ABNORMAL LOW (ref 13.0–17.1)
IMMATURE RETIC FRACT: 16.7 % — AB (ref 3.00–10.60)
LYMPH%: 23.1 % (ref 14.0–49.0)
MCH: 24.9 pg — AB (ref 27.2–33.4)
MCHC: 32.7 g/dL (ref 32.0–36.0)
MCV: 76.1 fL — AB (ref 79.3–98.0)
MONO#: 0.7 10*3/uL (ref 0.1–0.9)
MONO%: 7.9 % (ref 0.0–14.0)
NEUT#: 5.7 10*3/uL (ref 1.5–6.5)
NEUT%: 68.8 % (ref 39.0–75.0)
NRBC: 0 % (ref 0–0)
Platelets: 72 10*3/uL — ABNORMAL LOW (ref 140–400)
RBC: 3.73 10*6/uL — AB (ref 4.20–5.82)
RDW: 27 % — ABNORMAL HIGH (ref 11.0–14.6)
Retic %: 1.29 % (ref 0.80–1.80)
Retic Ct Abs: 48.12 10*3/uL (ref 34.80–93.90)
WBC: 8.3 10*3/uL (ref 4.0–10.3)
lymph#: 1.9 10*3/uL (ref 0.9–3.3)

## 2014-05-09 LAB — BASIC METABOLIC PANEL (CC13)
Anion Gap: 15 mEq/L — ABNORMAL HIGH (ref 3–11)
BUN: 23.6 mg/dL (ref 7.0–26.0)
CALCIUM: 9.1 mg/dL (ref 8.4–10.4)
CHLORIDE: 103 meq/L (ref 98–109)
CO2: 22 meq/L (ref 22–29)
CREATININE: 0.8 mg/dL (ref 0.7–1.3)
GLUCOSE: 139 mg/dL (ref 70–140)
Potassium: 3.7 mEq/L (ref 3.5–5.1)
Sodium: 139 mEq/L (ref 136–145)

## 2014-05-09 LAB — TECHNOLOGIST REVIEW

## 2014-05-09 MED ORDER — DARBEPOETIN ALFA-POLYSORBATE 500 MCG/ML IJ SOLN
500.0000 ug | Freq: Once | INTRAMUSCULAR | Status: AC
  Administered 2014-05-09: 500 ug via SUBCUTANEOUS
  Filled 2014-05-09: qty 1

## 2014-05-09 NOTE — Patient Instructions (Signed)
Darbepoetin Alfa injection What is this medicine? DARBEPOETIN ALFA (dar be POE e tin AL fa) helps your body make more red blood cells. It is used to treat anemia caused by chronic kidney failure and chemotherapy. This medicine may be used for other purposes; ask your health care provider or pharmacist if you have questions. COMMON BRAND NAME(S): Aranesp What should I tell my health care provider before I take this medicine? They need to know if you have any of these conditions: -blood clotting disorders or history of blood clots -cancer patient not on chemotherapy -cystic fibrosis -heart disease, such as angina, heart failure, or a history of a heart attack -hemoglobin level of 12 g/dL or greater -high blood pressure -low levels of folate, iron, or vitamin B12 -seizures -an unusual or allergic reaction to darbepoetin, erythropoietin, albumin, hamster proteins, latex, other medicines, foods, dyes, or preservatives -pregnant or trying to get pregnant -breast-feeding How should I use this medicine? This medicine is for injection into a vein or under the skin. It is usually given by a health care professional in a hospital or clinic setting. If you get this medicine at home, you will be taught how to prepare and give this medicine. Do not shake the solution before you withdraw a dose. Use exactly as directed. Take your medicine at regular intervals. Do not take your medicine more often than directed. It is important that you put your used needles and syringes in a special sharps container. Do not put them in a trash can. If you do not have a sharps container, call your pharmacist or healthcare provider to get one. Talk to your pediatrician regarding the use of this medicine in children. While this medicine may be used in children as young as 1 year for selected conditions, precautions do apply. Overdosage: If you think you have taken too much of this medicine contact a poison control center or  emergency room at once. NOTE: This medicine is only for you. Do not share this medicine with others. What if I miss a dose? If you miss a dose, take it as soon as you can. If it is almost time for your next dose, take only that dose. Do not take double or extra doses. What may interact with this medicine? Do not take this medicine with any of the following medications: -epoetin alfa This list may not describe all possible interactions. Give your health care provider a list of all the medicines, herbs, non-prescription drugs, or dietary supplements you use. Also tell them if you smoke, drink alcohol, or use illegal drugs. Some items may interact with your medicine. What should I watch for while using this medicine? Visit your prescriber or health care professional for regular checks on your progress and for the needed blood tests and blood pressure measurements. It is especially important for the doctor to make sure your hemoglobin level is in the desired range, to limit the risk of potential side effects and to give you the best benefit. Keep all appointments for any recommended tests. Check your blood pressure as directed. Ask your doctor what your blood pressure should be and when you should contact him or her. As your body makes more red blood cells, you may need to take iron, folic acid, or vitamin B supplements. Ask your doctor or health care provider which products are right for you. If you have kidney disease continue dietary restrictions, even though this medication can make you feel better. Talk with your doctor or health   care professional about the foods you eat and the vitamins that you take. What side effects may I notice from receiving this medicine? Side effects that you should report to your doctor or health care professional as soon as possible: -allergic reactions like skin rash, itching or hives, swelling of the face, lips, or tongue -breathing problems -changes in vision -chest  pain -confusion, trouble speaking or understanding -feeling faint or lightheaded, falls -high blood pressure -muscle aches or pains -pain, swelling, warmth in the leg -rapid weight gain -severe headaches -sudden numbness or weakness of the face, arm or leg -trouble walking, dizziness, loss of balance or coordination -seizures (convulsions) -swelling of the ankles, feet, hands -unusually weak or tired Side effects that usually do not require medical attention (report to your doctor or health care professional if they continue or are bothersome): -diarrhea -fever, chills (flu-like symptoms) -headaches -nausea, vomiting -redness, stinging, or swelling at site where injected This list may not describe all possible side effects. Call your doctor for medical advice about side effects. You may report side effects to FDA at 1-800-FDA-1088. Where should I keep my medicine? Keep out of the reach of children. Store in a refrigerator between 2 and 8 degrees C (36 and 46 degrees F). Do not freeze. Do not shake. Throw away any unused portion if using a single-dose vial. Throw away any unused medicine after the expiration date. NOTE: This sheet is a summary. It may not cover all possible information. If you have questions about this medicine, talk to your doctor, pharmacist, or health care provider.  2014, [employer] Standard. (2008-11-05 10:23:57)  

## 2014-05-28 ENCOUNTER — Telehealth: Payer: Self-pay | Admitting: Hematology and Oncology

## 2014-05-28 ENCOUNTER — Other Ambulatory Visit: Payer: Self-pay | Admitting: Hematology and Oncology

## 2014-05-28 DIAGNOSIS — D4620 Refractory anemia with excess of blasts, unspecified: Secondary | ICD-10-CM

## 2014-05-28 NOTE — Telephone Encounter (Signed)
Gave pt appt for july same as wife's appt per Dr. Alvy Bimler

## 2014-05-30 ENCOUNTER — Ambulatory Visit: Payer: Medicare Other | Admitting: Hematology and Oncology

## 2014-05-30 ENCOUNTER — Other Ambulatory Visit: Payer: Medicare Other

## 2014-05-30 ENCOUNTER — Ambulatory Visit: Payer: Medicare Other

## 2014-05-30 ENCOUNTER — Ambulatory Visit (HOSPITAL_BASED_OUTPATIENT_CLINIC_OR_DEPARTMENT_OTHER): Payer: Medicare Other

## 2014-05-30 ENCOUNTER — Other Ambulatory Visit (HOSPITAL_BASED_OUTPATIENT_CLINIC_OR_DEPARTMENT_OTHER): Payer: Medicare Other

## 2014-05-30 VITALS — BP 136/64 | HR 66 | Temp 98.0°F

## 2014-05-30 DIAGNOSIS — D4620 Refractory anemia with excess of blasts, unspecified: Secondary | ICD-10-CM

## 2014-05-30 DIAGNOSIS — D649 Anemia, unspecified: Secondary | ICD-10-CM

## 2014-05-30 LAB — CBC & DIFF AND RETIC
BASO%: 0.2 % (ref 0.0–2.0)
BASOS ABS: 0 10*3/uL (ref 0.0–0.1)
EOS%: 0 % (ref 0.0–7.0)
Eosinophils Absolute: 0 10*3/uL (ref 0.0–0.5)
HCT: 30.1 % — ABNORMAL LOW (ref 38.4–49.9)
HEMOGLOBIN: 9.7 g/dL — AB (ref 13.0–17.1)
Immature Retic Fract: 10.9 % — ABNORMAL HIGH (ref 3.00–10.60)
LYMPH%: 19.2 % (ref 14.0–49.0)
MCH: 24.7 pg — AB (ref 27.2–33.4)
MCHC: 32.2 g/dL (ref 32.0–36.0)
MCV: 76.6 fL — ABNORMAL LOW (ref 79.3–98.0)
MONO#: 1.1 10*3/uL — ABNORMAL HIGH (ref 0.1–0.9)
MONO%: 8.8 % (ref 0.0–14.0)
NEUT#: 8.6 10*3/uL — ABNORMAL HIGH (ref 1.5–6.5)
NEUT%: 71.8 % (ref 39.0–75.0)
PLATELETS: 77 10*3/uL — AB (ref 140–400)
RBC: 3.93 10*6/uL — ABNORMAL LOW (ref 4.20–5.82)
RDW: 28.1 % — AB (ref 11.0–14.6)
RETIC %: 1.75 % (ref 0.80–1.80)
Retic Ct Abs: 68.78 10*3/uL (ref 34.80–93.90)
WBC: 12 10*3/uL — ABNORMAL HIGH (ref 4.0–10.3)
lymph#: 2.3 10*3/uL (ref 0.9–3.3)
nRBC: 0 % (ref 0–0)

## 2014-05-30 LAB — IRON AND TIBC CHCC
%SAT: 44 % (ref 20–55)
Iron: 87 ug/dL (ref 42–163)
TIBC: 198 ug/dL — ABNORMAL LOW (ref 202–409)
UIBC: 110 ug/dL — AB (ref 117–376)

## 2014-05-30 LAB — BASIC METABOLIC PANEL (CC13)
Anion Gap: 12 mEq/L — ABNORMAL HIGH (ref 3–11)
BUN: 26.2 mg/dL — ABNORMAL HIGH (ref 7.0–26.0)
CHLORIDE: 102 meq/L (ref 98–109)
CO2: 24 mEq/L (ref 22–29)
Calcium: 9.8 mg/dL (ref 8.4–10.4)
Creatinine: 0.8 mg/dL (ref 0.7–1.3)
GLUCOSE: 147 mg/dL — AB (ref 70–140)
POTASSIUM: 3.7 meq/L (ref 3.5–5.1)
SODIUM: 138 meq/L (ref 136–145)

## 2014-05-30 LAB — TECHNOLOGIST REVIEW

## 2014-05-30 LAB — FERRITIN CHCC: Ferritin: 337 ng/ml — ABNORMAL HIGH (ref 22–316)

## 2014-05-30 MED ORDER — DARBEPOETIN ALFA-POLYSORBATE 500 MCG/ML IJ SOLN
500.0000 ug | Freq: Once | INTRAMUSCULAR | Status: AC
  Administered 2014-05-30: 500 ug via SUBCUTANEOUS
  Filled 2014-05-30: qty 1

## 2014-06-12 ENCOUNTER — Other Ambulatory Visit: Payer: Self-pay | Admitting: *Deleted

## 2014-06-20 ENCOUNTER — Other Ambulatory Visit (HOSPITAL_BASED_OUTPATIENT_CLINIC_OR_DEPARTMENT_OTHER): Payer: Medicare Other

## 2014-06-20 ENCOUNTER — Encounter: Payer: Self-pay | Admitting: Hematology and Oncology

## 2014-06-20 ENCOUNTER — Ambulatory Visit (HOSPITAL_BASED_OUTPATIENT_CLINIC_OR_DEPARTMENT_OTHER): Payer: Medicare Other | Admitting: Hematology and Oncology

## 2014-06-20 ENCOUNTER — Ambulatory Visit: Payer: Medicare Other

## 2014-06-20 ENCOUNTER — Other Ambulatory Visit: Payer: Medicare Other

## 2014-06-20 ENCOUNTER — Telehealth: Payer: Self-pay | Admitting: Hematology and Oncology

## 2014-06-20 ENCOUNTER — Ambulatory Visit: Payer: Medicare Other | Admitting: Hematology and Oncology

## 2014-06-20 ENCOUNTER — Ambulatory Visit (HOSPITAL_BASED_OUTPATIENT_CLINIC_OR_DEPARTMENT_OTHER): Payer: Medicare Other

## 2014-06-20 VITALS — BP 143/51 | HR 75 | Temp 97.8°F | Resp 18 | Ht 68.0 in | Wt 205.0 lb

## 2014-06-20 DIAGNOSIS — D696 Thrombocytopenia, unspecified: Secondary | ICD-10-CM

## 2014-06-20 DIAGNOSIS — D4620 Refractory anemia with excess of blasts, unspecified: Secondary | ICD-10-CM

## 2014-06-20 DIAGNOSIS — D630 Anemia in neoplastic disease: Secondary | ICD-10-CM

## 2014-06-20 LAB — CBC & DIFF AND RETIC
BASO%: 0.3 % (ref 0.0–2.0)
Basophils Absolute: 0 10*3/uL (ref 0.0–0.1)
EOS ABS: 0 10*3/uL (ref 0.0–0.5)
EOS%: 0 % (ref 0.0–7.0)
HCT: 30.7 % — ABNORMAL LOW (ref 38.4–49.9)
HGB: 9.8 g/dL — ABNORMAL LOW (ref 13.0–17.1)
Immature Retic Fract: 14.8 % — ABNORMAL HIGH (ref 3.00–10.60)
LYMPH%: 25.7 % (ref 14.0–49.0)
MCH: 24.5 pg — ABNORMAL LOW (ref 27.2–33.4)
MCHC: 31.9 g/dL — AB (ref 32.0–36.0)
MCV: 76.8 fL — ABNORMAL LOW (ref 79.3–98.0)
MONO#: 0.7 10*3/uL (ref 0.1–0.9)
MONO%: 7.1 % (ref 0.0–14.0)
NEUT#: 6.7 10*3/uL — ABNORMAL HIGH (ref 1.5–6.5)
NEUT%: 66.9 % (ref 39.0–75.0)
Platelets: 64 10*3/uL — ABNORMAL LOW (ref 140–400)
RBC: 4 10*6/uL — AB (ref 4.20–5.82)
RDW: 27.4 % — AB (ref 11.0–14.6)
RETIC %: 1.23 % (ref 0.80–1.80)
RETIC CT ABS: 49.2 10*3/uL (ref 34.80–93.90)
WBC: 10 10*3/uL (ref 4.0–10.3)
lymph#: 2.6 10*3/uL (ref 0.9–3.3)

## 2014-06-20 LAB — TECHNOLOGIST REVIEW

## 2014-06-20 MED ORDER — DARBEPOETIN ALFA-POLYSORBATE 500 MCG/ML IJ SOLN
500.0000 ug | Freq: Once | INTRAMUSCULAR | Status: AC
  Administered 2014-06-20: 500 ug via SUBCUTANEOUS
  Filled 2014-06-20: qty 1

## 2014-06-20 NOTE — Assessment & Plan Note (Signed)
From anemia and thrombocytopenia, he denies recurrent infection. I would not recommend treating MDS for now. He remained on low-dose prednisone for other reasons.

## 2014-06-20 NOTE — Progress Notes (Signed)
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    INTERVAL HISTORY: Please see below for problem oriented charting. He complains of feeling fatigued. Denies any worsening bone pain. He denies recent infection. The patient denies any recent signs or symptoms of bleeding such as spontaneous epistaxis, hematuria or hematochezia. He denies any recent fever, chills, night sweats or abnormal weight loss   REVIEW OF SYSTEMS:   Constitutional: Denies fevers, chills or abnormal weight loss Eyes: Denies blurriness of vision Ears, nose, mouth, throat, and face: Denies mucositis or sore throat Respiratory: Denies cough, dyspnea or wheezes Cardiovascular: Denies palpitation, chest discomfort or lower extremity swelling Gastrointestinal:  Denies nausea, heartburn or change in bowel habits Skin: Denies abnormal skin rashes Lymphatics: Denies new lymphadenopathy or easy bruising Neurological:Denies numbness, tingling or new weaknesses Behavioral/Psych: Mood is stable, no new changes  All other systems were reviewed with the patient and are negative.  I have reviewed the past medical history, past surgical history, social history and family history with the patient and they are unchanged from previous note.  ALLERGIES:  has No Known Allergies.  MEDICATIONS:  Current Outpatient Prescriptions  Medication Sig Dispense Refill  . acetaminophen (TYLENOL) 500 MG tablet Take 1,000 mg by mouth every 6 (six)  hours as needed for pain.      Marland Kitchen alendronate (FOSAMAX) 70 MG tablet Take 70 mg by mouth once a week. Take with a full glass of water on an empty stomach.      Marland Kitchen allopurinol (ZYLOPRIM) 100 MG tablet Take 100 mg by mouth daily.      . Calcium-Vitamin D (CALTRATE 600 PLUS-VIT D PO) Take 1 tablet by mouth 2 (two) times daily.       . cyclobenzaprine (FLEXERIL) 10 MG tablet Take 10 mg by mouth as needed.      Marland Kitchen lisinopril-hydrochlorothiazide (PRINZIDE,ZESTORETIC) 20-25 MG per tablet Take 0.5 tablets by mouth daily.       Marland Kitchen loratadine (CLARITIN) 10 MG tablet Take 10 mg by mouth daily.      . metoprolol (LOPRESSOR) 50 MG tablet take 1/2 tablet by mouth twice a day  30 tablet  3  . Omega-3 Fatty Acids (FISH OIL) 1000 MG CAPS Take 2,000 mg by mouth 2 (two) times daily.      . predniSONE (DELTASONE) 5 MG tablet Take 9 mg by mouth daily.       . traMADol (ULTRAM) 50 MG tablet Take 50-100 mg by mouth every 4 (four) hours as needed for pain.       No current facility-administered medications for this visit.    PHYSICAL EXAMINATION: ECOG PERFORMANCE STATUS: 1 - Symptomatic but completely ambulatory  Filed Vitals:   06/20/14 0932  BP: 143/51  Pulse: 75  Temp: 97.8 F (36.6 C)  Resp: 18   Filed Weights   06/20/14 0932  Weight: 205 lb (92.987 kg)    GENERAL:alert, no distress and comfortable. He is mildly cushingoid SKIN: skin color, texture, turgor are normal, no rashes or significant lesions. Extensive bruises are noted EYES: normal, Conjunctiva are  pink and non-injected, sclera clear OROPHARYNX:no exudate, no erythema and lips, buccal mucosa, and tongue normal  Musculoskeletal:no cyanosis of digits and no clubbing  NEURO: alert & oriented x 3 with fluent speech, no focal motor/sensory deficits  LABORATORY DATA:  I have reviewed the data as listed    Component Value Date/Time   NA 138 05/30/2014 1138   NA 135 02/09/2013 0423   K 3.7 05/30/2014 1138   K 4.1 02/09/2013 0423   CL 102 04/09/2013  1347   CL 97 02/09/2013 0423   CO2 24 05/30/2014 1138   CO2 28 02/09/2013 0423   GLUCOSE 147* 05/30/2014 1138   GLUCOSE 113* 04/09/2013 1347   GLUCOSE 101* 02/09/2013 0423   BUN 26.2* 05/30/2014 1138   BUN 22 02/09/2013 0423   CREATININE 0.8 05/30/2014 1138   CREATININE 0.89 02/09/2013 0423   CALCIUM 9.8 05/30/2014 1138   CALCIUM 8.9 02/09/2013 0423   PROT 7.4 11/22/2013 0847   PROT 8.0 01/26/2013 1018   ALBUMIN 4.1 11/22/2013 0847   ALBUMIN 3.8 01/26/2013 1018   AST 15 11/22/2013 0847   AST 17 01/26/2013 1018   ALT 15 11/22/2013 0847   ALT 17 01/26/2013 1018   ALKPHOS 48 11/22/2013 0847   ALKPHOS 63 01/26/2013 1018   BILITOT 1.15 11/22/2013 0847   BILITOT 0.9 02/09/2013 0423   GFRNONAA 87* 02/09/2013 0423   GFRAA >90 02/09/2013 0423    No results found for this basename: SPEP, UPEP,  kappa and lambda light chains    Lab Results  Component Value Date   WBC 10.0 06/20/2014   NEUTROABS 6.7* 06/20/2014   HGB 9.8* 06/20/2014   HCT 30.7* 06/20/2014   MCV 76.8* 06/20/2014   PLT 64* 06/20/2014      Chemistry      Component Value Date/Time   NA 138 05/30/2014 1138   NA 135 02/09/2013 0423   K 3.7 05/30/2014 1138   K 4.1 02/09/2013 0423   CL 102 04/09/2013 1347   CL 97 02/09/2013 0423   CO2 24 05/30/2014 1138   CO2 28 02/09/2013 0423   BUN 26.2* 05/30/2014 1138   BUN 22 02/09/2013 0423   CREATININE 0.8 05/30/2014 1138   CREATININE 0.89 02/09/2013 0423      Component Value Date/Time   CALCIUM 9.8 05/30/2014 1138   CALCIUM 8.9 02/09/2013 0423   ALKPHOS 48 11/22/2013 0847   ALKPHOS 63 01/26/2013 1018   AST 15 11/22/2013 0847   AST 17 01/26/2013 1018   ALT 15 11/22/2013 0847   ALT 17 01/26/2013 1018   BILITOT 1.15 11/22/2013 0847   BILITOT 0.9 02/09/2013 0423     ASSESSMENT & PLAN:  MDS (myelodysplastic syndrome), low grade From anemia and thrombocytopenia, he denies recurrent infection. I would not recommend treating MDS for now. He remained on low-dose prednisone for other reasons.  Anemia in neoplastic  disease This is likely anemia of chronic disease. The patient denies recent history of bleeding such as epistaxis, hematuria or hematochezia. He is asymptomatic from the anemia. He will continue to receive erythropoietin stimulating agents with darbopoetin 500 mcg every 3 weeks to keep hemoglobin greater than 10 g.  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 He is not symptomatic. He will continue on low dose prednisone   No orders of the defined types were placed in this encounter.   All questions were answered. The patient knows to call the clinic with any problems, questions or concerns. No barriers  to learning was detected. I spent 20 minutes counseling the patient face to face. The total time spent in the appointment was 20 minutes and more than 50% was on counseling and review of test results     [facility], [place], MD 06/20/2014 9:11 PM

## 2014-06-20 NOTE — Assessment & Plan Note (Signed)
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 He is not symptomatic. He will continue on low dose prednisone

## 2014-06-20 NOTE — Assessment & Plan Note (Signed)
This is likely anemia of chronic disease. The patient denies recent history of bleeding such as epistaxis, hematuria or hematochezia. He is asymptomatic from the anemia. He will continue to receive erythropoietin stimulating agents with darbopoetin 500 mcg every 3 weeks to keep hemoglobin greater than 10 g.   

## 2014-06-20 NOTE — Telephone Encounter (Signed)
gv pt appt schedule for aug thru nov

## 2014-06-30 ENCOUNTER — Other Ambulatory Visit: Payer: Self-pay

## 2014-06-30 MED ORDER — METOPROLOL TARTRATE 50 MG PO TABS: ORAL_TABLET | ORAL | Status: DC

## 2014-07-09 ENCOUNTER — Other Ambulatory Visit: Payer: Self-pay

## 2014-07-10 ENCOUNTER — Other Ambulatory Visit: Payer: Self-pay | Admitting: Hematology and Oncology

## 2014-07-10 DIAGNOSIS — D696 Thrombocytopenia, unspecified: Secondary | ICD-10-CM

## 2014-07-10 DIAGNOSIS — D4620 Refractory anemia with excess of blasts, unspecified: Secondary | ICD-10-CM

## 2014-07-11 ENCOUNTER — Ambulatory Visit (HOSPITAL_BASED_OUTPATIENT_CLINIC_OR_DEPARTMENT_OTHER): Payer: Medicare Other

## 2014-07-11 VITALS — BP 119/59 | HR 53 | Temp 98.4°F

## 2014-07-11 DIAGNOSIS — D4620 Refractory anemia with excess of blasts, unspecified: Secondary | ICD-10-CM

## 2014-07-11 DIAGNOSIS — D649 Anemia, unspecified: Secondary | ICD-10-CM

## 2014-07-11 DIAGNOSIS — D696 Thrombocytopenia, unspecified: Secondary | ICD-10-CM

## 2014-07-11 LAB — CBC WITH DIFFERENTIAL/PLATELET
BASO%: 0.3 % (ref 0.0–2.0)
Basophils Absolute: 0 10*3/uL (ref 0.0–0.1)
EOS%: 0 % (ref 0.0–7.0)
Eosinophils Absolute: 0 10*3/uL (ref 0.0–0.5)
HCT: 28.8 % — ABNORMAL LOW (ref 38.4–49.9)
HGB: 9.3 g/dL — ABNORMAL LOW (ref 13.0–17.1)
LYMPH%: 25 % (ref 14.0–49.0)
MCH: 24.9 pg — AB (ref 27.2–33.4)
MCHC: 32.3 g/dL (ref 32.0–36.0)
MCV: 77 fL — AB (ref 79.3–98.0)
MONO#: 0.7 10*3/uL (ref 0.1–0.9)
MONO%: 7 % (ref 0.0–14.0)
NEUT#: 6.7 10*3/uL — ABNORMAL HIGH (ref 1.5–6.5)
NEUT%: 67.7 % (ref 39.0–75.0)
NRBC: 0 % (ref 0–0)
Platelets: 61 10*3/uL — ABNORMAL LOW (ref 140–400)
RBC: 3.74 10*6/uL — AB (ref 4.20–5.82)
RDW: 27.5 % — ABNORMAL HIGH (ref 11.0–14.6)
WBC: 9.9 10*3/uL (ref 4.0–10.3)
lymph#: 2.5 10*3/uL (ref 0.9–3.3)

## 2014-07-11 LAB — TECHNOLOGIST REVIEW

## 2014-07-11 MED ORDER — DARBEPOETIN ALFA-POLYSORBATE 500 MCG/ML IJ SOLN
500.0000 ug | Freq: Once | INTRAMUSCULAR | Status: AC
  Administered 2014-07-11: 500 ug via SUBCUTANEOUS
  Filled 2014-07-11: qty 1

## 2014-08-01 ENCOUNTER — Other Ambulatory Visit: Payer: Medicare Other

## 2014-08-01 ENCOUNTER — Ambulatory Visit (HOSPITAL_BASED_OUTPATIENT_CLINIC_OR_DEPARTMENT_OTHER): Payer: Medicare Other

## 2014-08-01 VITALS — BP 132/61 | HR 56 | Temp 97.9°F

## 2014-08-01 DIAGNOSIS — D4620 Refractory anemia with excess of blasts, unspecified: Secondary | ICD-10-CM

## 2014-08-01 DIAGNOSIS — D696 Thrombocytopenia, unspecified: Secondary | ICD-10-CM

## 2014-08-01 DIAGNOSIS — D649 Anemia, unspecified: Secondary | ICD-10-CM

## 2014-08-01 LAB — CBC WITH DIFFERENTIAL/PLATELET
BASO%: 0.1 % (ref 0.0–2.0)
Basophils Absolute: 0 10*3/uL (ref 0.0–0.1)
EOS%: 0 % (ref 0.0–7.0)
Eosinophils Absolute: 0 10*3/uL (ref 0.0–0.5)
HCT: 28.6 % — ABNORMAL LOW (ref 38.4–49.9)
HGB: 9.1 g/dL — ABNORMAL LOW (ref 13.0–17.1)
LYMPH#: 2 10*3/uL (ref 0.9–3.3)
LYMPH%: 26.4 % (ref 14.0–49.0)
MCH: 24.9 pg — AB (ref 27.2–33.4)
MCHC: 31.8 g/dL — ABNORMAL LOW (ref 32.0–36.0)
MCV: 78.1 fL — AB (ref 79.3–98.0)
MONO#: 0.7 10*3/uL (ref 0.1–0.9)
MONO%: 9.2 % (ref 0.0–14.0)
NEUT#: 5 10*3/uL (ref 1.5–6.5)
NEUT%: 64.3 % (ref 39.0–75.0)
Platelets: 55 10*3/uL — ABNORMAL LOW (ref 140–400)
RBC: 3.66 10*6/uL — ABNORMAL LOW (ref 4.20–5.82)
RDW: 27.7 % — AB (ref 11.0–14.6)
WBC: 7.7 10*3/uL (ref 4.0–10.3)
nRBC: 0 % (ref 0–0)

## 2014-08-01 LAB — TECHNOLOGIST REVIEW

## 2014-08-01 MED ORDER — DARBEPOETIN ALFA-POLYSORBATE 500 MCG/ML IJ SOLN
500.0000 ug | Freq: Once | INTRAMUSCULAR | Status: AC
  Administered 2014-08-01: 500 ug via SUBCUTANEOUS
  Filled 2014-08-01: qty 1

## 2014-08-22 ENCOUNTER — Ambulatory Visit (HOSPITAL_BASED_OUTPATIENT_CLINIC_OR_DEPARTMENT_OTHER): Payer: Medicare Other

## 2014-08-22 ENCOUNTER — Other Ambulatory Visit (HOSPITAL_BASED_OUTPATIENT_CLINIC_OR_DEPARTMENT_OTHER): Payer: Medicare Other

## 2014-08-22 ENCOUNTER — Other Ambulatory Visit: Payer: Self-pay | Admitting: Hematology and Oncology

## 2014-08-22 VITALS — BP 125/57 | HR 57 | Temp 97.9°F

## 2014-08-22 DIAGNOSIS — D539 Nutritional anemia, unspecified: Secondary | ICD-10-CM | POA: Insufficient documentation

## 2014-08-22 DIAGNOSIS — D4620 Refractory anemia with excess of blasts, unspecified: Secondary | ICD-10-CM

## 2014-08-22 DIAGNOSIS — D649 Anemia, unspecified: Secondary | ICD-10-CM

## 2014-08-22 DIAGNOSIS — D696 Thrombocytopenia, unspecified: Secondary | ICD-10-CM

## 2014-08-22 DIAGNOSIS — Z23 Encounter for immunization: Secondary | ICD-10-CM

## 2014-08-22 LAB — CBC WITH DIFFERENTIAL/PLATELET
BASO%: 0.3 % (ref 0.0–2.0)
BASOS ABS: 0 10*3/uL (ref 0.0–0.1)
EOS%: 0 % (ref 0.0–7.0)
Eosinophils Absolute: 0 10*3/uL (ref 0.0–0.5)
HCT: 29.3 % — ABNORMAL LOW (ref 38.4–49.9)
HEMOGLOBIN: 9.4 g/dL — AB (ref 13.0–17.1)
LYMPH#: 2.6 10*3/uL (ref 0.9–3.3)
LYMPH%: 23.1 % (ref 14.0–49.0)
MCH: 25 pg — ABNORMAL LOW (ref 27.2–33.4)
MCHC: 32.1 g/dL (ref 32.0–36.0)
MCV: 77.9 fL — ABNORMAL LOW (ref 79.3–98.0)
MONO#: 0.6 10*3/uL (ref 0.1–0.9)
MONO%: 5.7 % (ref 0.0–14.0)
NEUT#: 8 10*3/uL — ABNORMAL HIGH (ref 1.5–6.5)
NEUT%: 70.9 % (ref 39.0–75.0)
NRBC: 0 % (ref 0–0)
Platelets: 83 10*3/uL — ABNORMAL LOW (ref 140–400)
RBC: 3.76 10*6/uL — ABNORMAL LOW (ref 4.20–5.82)
RDW: 28 % — ABNORMAL HIGH (ref 11.0–14.6)
WBC: 11.3 10*3/uL — ABNORMAL HIGH (ref 4.0–10.3)

## 2014-08-22 LAB — TECHNOLOGIST REVIEW

## 2014-08-22 LAB — SEDIMENTATION RATE: Sed Rate: 1 mm/hr (ref 0–16)

## 2014-08-22 LAB — VITAMIN B12: VITAMIN B 12: 665 pg/mL (ref 211–911)

## 2014-08-22 MED ORDER — INFLUENZA VAC SPLIT QUAD 0.5 ML IM SUSY
0.5000 mL | PREFILLED_SYRINGE | Freq: Once | INTRAMUSCULAR | Status: AC
  Administered 2014-08-22: 0.5 mL via INTRAMUSCULAR
  Filled 2014-08-22: qty 0.5

## 2014-08-22 MED ORDER — DARBEPOETIN ALFA-POLYSORBATE 500 MCG/ML IJ SOLN
500.0000 ug | Freq: Once | INTRAMUSCULAR | Status: AC
  Administered 2014-08-22: 500 ug via SUBCUTANEOUS
  Filled 2014-08-22: qty 1

## 2014-09-12 ENCOUNTER — Ambulatory Visit (HOSPITAL_BASED_OUTPATIENT_CLINIC_OR_DEPARTMENT_OTHER): Payer: Medicare Other

## 2014-09-12 ENCOUNTER — Other Ambulatory Visit (HOSPITAL_BASED_OUTPATIENT_CLINIC_OR_DEPARTMENT_OTHER): Payer: Medicare Other

## 2014-09-12 VITALS — BP 142/52 | HR 62 | Temp 98.2°F

## 2014-09-12 DIAGNOSIS — D4620 Refractory anemia with excess of blasts, unspecified: Secondary | ICD-10-CM

## 2014-09-12 DIAGNOSIS — D696 Thrombocytopenia, unspecified: Secondary | ICD-10-CM

## 2014-09-12 DIAGNOSIS — D46Z Other myelodysplastic syndromes: Secondary | ICD-10-CM

## 2014-09-12 DIAGNOSIS — D649 Anemia, unspecified: Secondary | ICD-10-CM

## 2014-09-12 LAB — CBC WITH DIFFERENTIAL/PLATELET
BASO%: 0.2 % (ref 0.0–2.0)
Basophils Absolute: 0 10*3/uL (ref 0.0–0.1)
EOS%: 0 % (ref 0.0–7.0)
Eosinophils Absolute: 0 10*3/uL (ref 0.0–0.5)
HCT: 29.8 % — ABNORMAL LOW (ref 38.4–49.9)
HGB: 9.5 g/dL — ABNORMAL LOW (ref 13.0–17.1)
LYMPH%: 22.9 % (ref 14.0–49.0)
MCH: 25.3 pg — AB (ref 27.2–33.4)
MCHC: 31.9 g/dL — ABNORMAL LOW (ref 32.0–36.0)
MCV: 79.3 fL (ref 79.3–98.0)
MONO#: 1.4 10*3/uL — ABNORMAL HIGH (ref 0.1–0.9)
MONO%: 14.4 % — AB (ref 0.0–14.0)
NEUT#: 6.3 10*3/uL (ref 1.5–6.5)
NEUT%: 62.5 % (ref 39.0–75.0)
NRBC: 0 % (ref 0–0)
Platelets: 66 10*3/uL — ABNORMAL LOW (ref 140–400)
RBC: 3.76 10*6/uL — AB (ref 4.20–5.82)
RDW: 28.2 % — ABNORMAL HIGH (ref 11.0–14.6)
WBC: 10 10*3/uL (ref 4.0–10.3)
lymph#: 2.3 10*3/uL (ref 0.9–3.3)

## 2014-09-12 MED ORDER — DARBEPOETIN ALFA-POLYSORBATE 500 MCG/ML IJ SOLN
500.0000 ug | Freq: Once | INTRAMUSCULAR | Status: AC
  Administered 2014-09-12: 500 ug via SUBCUTANEOUS
  Filled 2014-09-12: qty 1

## 2014-10-03 ENCOUNTER — Other Ambulatory Visit (HOSPITAL_BASED_OUTPATIENT_CLINIC_OR_DEPARTMENT_OTHER): Payer: Medicare Other

## 2014-10-03 ENCOUNTER — Telehealth: Payer: Self-pay | Admitting: Hematology and Oncology

## 2014-10-03 ENCOUNTER — Encounter: Payer: Self-pay | Admitting: Hematology and Oncology

## 2014-10-03 ENCOUNTER — Ambulatory Visit (HOSPITAL_BASED_OUTPATIENT_CLINIC_OR_DEPARTMENT_OTHER): Payer: Medicare Other | Admitting: Hematology and Oncology

## 2014-10-03 ENCOUNTER — Ambulatory Visit: Payer: Medicare Other

## 2014-10-03 VITALS — BP 131/57 | HR 55 | Temp 98.1°F | Resp 18 | Ht 68.0 in | Wt 200.9 lb

## 2014-10-03 DIAGNOSIS — D649 Anemia, unspecified: Secondary | ICD-10-CM

## 2014-10-03 DIAGNOSIS — D4620 Refractory anemia with excess of blasts, unspecified: Secondary | ICD-10-CM

## 2014-10-03 DIAGNOSIS — M069 Rheumatoid arthritis, unspecified: Secondary | ICD-10-CM

## 2014-10-03 DIAGNOSIS — D46Z Other myelodysplastic syndromes: Secondary | ICD-10-CM | POA: Diagnosis not present

## 2014-10-03 DIAGNOSIS — D72829 Elevated white blood cell count, unspecified: Secondary | ICD-10-CM

## 2014-10-03 DIAGNOSIS — D696 Thrombocytopenia, unspecified: Secondary | ICD-10-CM

## 2014-10-03 DIAGNOSIS — D630 Anemia in neoplastic disease: Secondary | ICD-10-CM

## 2014-10-03 LAB — CBC WITH DIFFERENTIAL/PLATELET
BASO%: 0.3 % (ref 0.0–2.0)
Basophils Absolute: 0.1 10*3/uL (ref 0.0–0.1)
EOS%: 0 % (ref 0.0–7.0)
Eosinophils Absolute: 0 10*3/uL (ref 0.0–0.5)
HCT: 30.3 % — ABNORMAL LOW (ref 38.4–49.9)
HGB: 9.8 g/dL — ABNORMAL LOW (ref 13.0–17.1)
LYMPH#: 2.9 10*3/uL (ref 0.9–3.3)
LYMPH%: 18.5 % (ref 14.0–49.0)
MCH: 25.1 pg — AB (ref 27.2–33.4)
MCHC: 32.3 g/dL (ref 32.0–36.0)
MCV: 77.7 fL — ABNORMAL LOW (ref 79.3–98.0)
MONO#: 2 10*3/uL — AB (ref 0.1–0.9)
MONO%: 12.6 % (ref 0.0–14.0)
NEUT#: 10.8 10*3/uL — ABNORMAL HIGH (ref 1.5–6.5)
NEUT%: 68.6 % (ref 39.0–75.0)
NRBC: 0 % (ref 0–0)
Platelets: 80 10*3/uL — ABNORMAL LOW (ref 140–400)
RBC: 3.9 10*6/uL — AB (ref 4.20–5.82)
RDW: 27.7 % — ABNORMAL HIGH (ref 11.0–14.6)
WBC: 15.7 10*3/uL — AB (ref 4.0–10.3)

## 2014-10-03 LAB — TECHNOLOGIST REVIEW

## 2014-10-03 MED ORDER — DARBEPOETIN ALFA-POLYSORBATE 500 MCG/ML IJ SOLN
500.0000 ug | Freq: Once | INTRAMUSCULAR | Status: AC
  Administered 2014-10-03: 500 ug via SUBCUTANEOUS
  Filled 2014-10-03: qty 1

## 2014-10-03 NOTE — Assessment & Plan Note (Signed)
From anemia and thrombocytopenia, he denies recurrent infection. I would not recommend treating MDS for now. He will continue darbepoetin injection every 3 weeks. He remained on low-dose prednisone for other reasons.

## 2014-10-03 NOTE — Telephone Encounter (Signed)
gv and printed appt sched and avs fo rpt for NOV thru April 2016

## 2014-10-03 NOTE — Assessment & Plan Note (Signed)
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 He is not symptomatic. He will continue on low dose prednisone

## 2014-10-03 NOTE — Assessment & Plan Note (Signed)
This is reactive in nature related to prednisone therapy.

## 2014-10-03 NOTE — Progress Notes (Signed)
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    INTERVAL HISTORY: Please see below for problem oriented charting. He is seen as part of his supportive care visit. He is doing well on prednisone therapy. He also gets darbepoetin every 3 weeks to keep his hemoglobin around 10. He has mild arthritis. He bruises easily. The patient denies any recent signs or symptoms of bleeding such as spontaneous epistaxis, hematuria or hematochezia.  REVIEW OF SYSTEMS:   Constitutional: Denies fevers, chills or abnormal weight loss Eyes: Denies blurriness of vision Ears, nose, mouth, throat, and face: Denies mucositis or sore throat Respiratory: Denies cough, dyspnea or wheezes Cardiovascular: Denies palpitation, chest discomfort or lower extremity swelling Gastrointestinal:  Denies nausea, heartburn or change in bowel habits Skin: Denies abnormal skin rashes Lymphatics: Denies new lymphadenopathy Neurological:Denies numbness, tingling or new weaknesses Behavioral/Psych: Mood is stable, no new changes  All other systems were reviewed with the patient and are negative.  I have reviewed the past medical history, past surgical history, social history and family history with the patient and they are unchanged from previous note.  ALLERGIES:  has No Known Allergies.  MEDICATIONS:  Current Outpatient Prescriptions  Medication Sig Dispense Refill  . acetaminophen (TYLENOL) 500 MG tablet Take 1,000 mg by mouth  every 6 (six) hours as needed for pain.      Marland Kitchen alendronate (FOSAMAX) 70 MG tablet Take 70 mg by mouth once a week. Take with a full glass of water on an empty stomach.      Marland Kitchen allopurinol (ZYLOPRIM) 100 MG tablet Take 100 mg by mouth daily.      . Calcium-Vitamin D (CALTRATE 600 PLUS-VIT D PO) Take 1 tablet by mouth 2 (two) times daily.       . cyclobenzaprine (FLEXERIL) 10 MG tablet Take 10 mg by mouth as needed.      Marland Kitchen lisinopril-hydrochlorothiazide (PRINZIDE,ZESTORETIC) 20-25 MG per tablet Take 0.5 tablets by mouth daily.       Marland Kitchen loratadine (CLARITIN) 10 MG tablet Take 10 mg by mouth daily.      . metoprolol (LOPRESSOR) 50 MG tablet take 1/2 tablet by mouth twice a day  30 tablet  6  . Omega-3 Fatty Acids (FISH OIL) 1000 MG CAPS Take 2,000 mg by mouth 2 (two) times daily.      . predniSONE (DELTASONE) 5 MG tablet Take 9 mg by mouth daily.       . traMADol (ULTRAM) 50 MG tablet Take 50-100 mg by mouth every 4 (four) hours as needed for pain.       No current facility-administered medications for this visit.    PHYSICAL EXAMINATION: ECOG PERFORMANCE STATUS: 0 - Asymptomatic  Filed Vitals:   10/03/14 0834  BP: 131/57  Pulse: 55  Temp: 98.1 F (36.7 C)  Resp: 18   Filed Weights   10/03/14 0834  Weight: 200 lb 14.4 oz (91.128 kg)    GENERAL:alert, no distress and comfortable. He is mildly cushingoid SKIN: skin color, texture, turgor are normal, no rashes or significant lesions. He has  mild skin bruises EYES: normal, Conjunctiva are pink and non-injected, sclera clear OROPHARYNX:no exudate, no erythema and lips, buccal mucosa, and tongue normal  NECK: supple, thyroid normal size, non-tender, without nodularity LYMPH:  no palpable lymphadenopathy in the cervical, axillary or inguinal LUNGS: clear to auscultation and percussion with normal breathing effort HEART: regular rate & rhythm and no murmurs and no lower extremity edema ABDOMEN:abdomen soft, non-tender and normal bowel  sounds Musculoskeletal:no cyanosis of digits and no clubbing  NEURO: alert & oriented x 3 with fluent speech, no focal motor/sensory deficits  LABORATORY DATA:  I have reviewed the data as listed    Component Value Date/Time   NA 138 05/30/2014 1138   NA 135 02/09/2013 0423   K 3.7 05/30/2014 1138   K 4.1 02/09/2013 0423   CL 102 04/09/2013 1347   CL 97 02/09/2013 0423   CO2 24 05/30/2014 1138   CO2 28 02/09/2013 0423   GLUCOSE 147* 05/30/2014 1138   GLUCOSE 113* 04/09/2013 1347   GLUCOSE 101* 02/09/2013 0423   BUN 26.2* 05/30/2014 1138   BUN 22 02/09/2013 0423   CREATININE 0.8 05/30/2014 1138   CREATININE 0.89 02/09/2013 0423   CALCIUM 9.8 05/30/2014 1138   CALCIUM 8.9 02/09/2013 0423   PROT 7.4 11/22/2013 0847   PROT 8.0 01/26/2013 1018   ALBUMIN 4.1 11/22/2013 0847   ALBUMIN 3.8 01/26/2013 1018   AST 15 11/22/2013 0847   AST 17 01/26/2013 1018   ALT 15 11/22/2013 0847   ALT 17 01/26/2013 1018   ALKPHOS 48 11/22/2013 0847   ALKPHOS 63 01/26/2013 1018   BILITOT 1.15 11/22/2013 0847   BILITOT 0.9 02/09/2013 0423   GFRNONAA 87* 02/09/2013 0423   GFRAA >90 02/09/2013 0423    No results found for this basename: SPEP, UPEP,  kappa and lambda light chains    Lab Results  Component Value Date   WBC 15.7* 10/03/2014   NEUTROABS 10.8* 10/03/2014   HGB 9.8* 10/03/2014   HCT 30.3* 10/03/2014   MCV 77.7* 10/03/2014   PLT 80* 10/03/2014      Chemistry      Component Value Date/Time   NA 138 05/30/2014 1138   NA 135 02/09/2013 0423   K 3.7 05/30/2014 1138   K 4.1 02/09/2013 0423   CL 102 04/09/2013 1347   CL 97 02/09/2013 0423   CO2 24 05/30/2014 1138   CO2 28 02/09/2013 0423   BUN 26.2* 05/30/2014 1138   BUN 22 02/09/2013 0423   CREATININE 0.8 05/30/2014 1138   CREATININE 0.89 02/09/2013 0423      Component Value Date/Time   CALCIUM 9.8 05/30/2014 1138   CALCIUM 8.9 02/09/2013 0423   ALKPHOS 48 11/22/2013 0847   ALKPHOS 63 01/26/2013 1018   AST 15 11/22/2013 0847   AST 17 01/26/2013 1018   ALT 15 11/22/2013 0847    ALT 17 01/26/2013 1018   BILITOT 1.15 11/22/2013 0847   BILITOT 0.9 02/09/2013 0423      ASSESSMENT & PLAN:  MDS (myelodysplastic syndrome), low grade From anemia and thrombocytopenia, he denies recurrent infection. I would not recommend treating MDS for now. He will continue darbepoetin injection every 3 weeks. He remained on low-dose prednisone for other reasons.    Anemia in neoplastic disease This is likely anemia of chronic disease. The patient denies recent history of bleeding such as epistaxis, hematuria or hematochezia. He is asymptomatic from the anemia. He will continue to receive erythropoietin stimulating agents with darbopoetin 500 mcg  every 3 weeks to keep hemoglobin greater than 10 g.    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 He is not symptomatic. He will continue on low dose prednisone    Rheumatoid arthritis He has chronic joint pain. He will continue on low dose prednisone.  Leukocytosis This is reactive in nature related to prednisone therapy.       All questions were answered. The patient knows to call the clinic with any problems, questions or concerns. No barriers to learning was detected. I spent 25 minutes counseling the patient face to face. The total time spent in the appointment was 30 minutes and more than 50% was on counseling and review of test results     [facility], [place], MD 10/03/2014 2:00 PM

## 2014-10-03 NOTE — Assessment & Plan Note (Signed)
He has chronic joint pain. He will continue on low dose prednisone.

## 2014-10-03 NOTE — Assessment & Plan Note (Signed)
This is likely anemia of chronic disease. The patient denies recent history of bleeding such as epistaxis, hematuria or hematochezia. He is asymptomatic from the anemia. He will continue to receive erythropoietin stimulating agents with darbopoetin 500 mcg every 3 weeks to keep hemoglobin greater than 10 g.   

## 2014-10-03 NOTE — Progress Notes (Signed)
Aranesp 500 given in office visit.

## 2014-10-24 ENCOUNTER — Ambulatory Visit (HOSPITAL_BASED_OUTPATIENT_CLINIC_OR_DEPARTMENT_OTHER): Payer: Medicare Other

## 2014-10-24 ENCOUNTER — Other Ambulatory Visit (HOSPITAL_BASED_OUTPATIENT_CLINIC_OR_DEPARTMENT_OTHER): Payer: Medicare Other

## 2014-10-24 DIAGNOSIS — D4620 Refractory anemia with excess of blasts, unspecified: Secondary | ICD-10-CM

## 2014-10-24 DIAGNOSIS — D649 Anemia, unspecified: Secondary | ICD-10-CM

## 2014-10-24 DIAGNOSIS — D696 Thrombocytopenia, unspecified: Secondary | ICD-10-CM

## 2014-10-24 DIAGNOSIS — D46Z Other myelodysplastic syndromes: Secondary | ICD-10-CM

## 2014-10-24 LAB — CBC WITH DIFFERENTIAL/PLATELET
BASO%: 0.2 % (ref 0.0–2.0)
Basophils Absolute: 0 10*3/uL (ref 0.0–0.1)
EOS%: 0 % (ref 0.0–7.0)
Eosinophils Absolute: 0 10*3/uL (ref 0.0–0.5)
HCT: 29.8 % — ABNORMAL LOW (ref 38.4–49.9)
HGB: 9.5 g/dL — ABNORMAL LOW (ref 13.0–17.1)
LYMPH#: 2.2 10*3/uL (ref 0.9–3.3)
LYMPH%: 19.6 % (ref 14.0–49.0)
MCH: 25.1 pg — ABNORMAL LOW (ref 27.2–33.4)
MCHC: 31.9 g/dL — AB (ref 32.0–36.0)
MCV: 78.6 fL — ABNORMAL LOW (ref 79.3–98.0)
MONO#: 1.3 10*3/uL — ABNORMAL HIGH (ref 0.1–0.9)
MONO%: 11.3 % (ref 0.0–14.0)
NEUT#: 7.8 10*3/uL — ABNORMAL HIGH (ref 1.5–6.5)
NEUT%: 68.9 % (ref 39.0–75.0)
NRBC: 0 % (ref 0–0)
Platelets: 80 10*3/uL — ABNORMAL LOW (ref 140–400)
RBC: 3.79 10*6/uL — AB (ref 4.20–5.82)
RDW: 27.3 % — AB (ref 11.0–14.6)
WBC: 11.3 10*3/uL — AB (ref 4.0–10.3)

## 2014-10-24 LAB — TECHNOLOGIST REVIEW: Technologist Review: 1

## 2014-10-24 MED ORDER — DARBEPOETIN ALFA 500 MCG/ML IJ SOSY
500.0000 ug | PREFILLED_SYRINGE | Freq: Once | INTRAMUSCULAR | Status: AC
  Administered 2014-10-24: 500 ug via SUBCUTANEOUS
  Filled 2014-10-24: qty 1

## 2014-10-24 NOTE — Patient Instructions (Signed)
Darbepoetin Alfa injection What is this medicine? DARBEPOETIN ALFA (dar be POE e tin AL fa) helps your body make more red blood cells. It is used to treat anemia caused by chronic kidney failure and chemotherapy. This medicine may be used for other purposes; ask your health care provider or pharmacist if you have questions. COMMON BRAND NAME(S): Aranesp What should I tell my health care provider before I take this medicine? They need to know if you have any of these conditions: -blood clotting disorders or history of blood clots -cancer patient not on chemotherapy -cystic fibrosis -heart disease, such as angina, heart failure, or a history of a heart attack -hemoglobin level of 12 g/dL or greater -high blood pressure -low levels of folate, iron, or vitamin B12 -seizures -an unusual or allergic reaction to darbepoetin, erythropoietin, albumin, hamster proteins, latex, other medicines, foods, dyes, or preservatives -pregnant or trying to get pregnant -breast-feeding How should I use this medicine? This medicine is for injection into a vein or under the skin. It is usually given by a health care professional in a hospital or clinic setting. If you get this medicine at home, you will be taught how to prepare and give this medicine. Do not shake the solution before you withdraw a dose. Use exactly as directed. Take your medicine at regular intervals. Do not take your medicine more often than directed. It is important that you put your used needles and syringes in a special sharps container. Do not put them in a trash can. If you do not have a sharps container, call your pharmacist or healthcare provider to get one. Talk to your pediatrician regarding the use of this medicine in children. While this medicine may be used in children as young as 1 year for selected conditions, precautions do apply. Overdosage: If you think you have taken too much of this medicine contact a poison control center or  emergency room at once. NOTE: This medicine is only for you. Do not share this medicine with others. What if I miss a dose? If you miss a dose, take it as soon as you can. If it is almost time for your next dose, take only that dose. Do not take double or extra doses. What may interact with this medicine? Do not take this medicine with any of the following medications: -epoetin alfa This list may not describe all possible interactions. Give your health care provider a list of all the medicines, herbs, non-prescription drugs, or dietary supplements you use. Also tell them if you smoke, drink alcohol, or use illegal drugs. Some items may interact with your medicine. What should I watch for while using this medicine? Visit your prescriber or health care professional for regular checks on your progress and for the needed blood tests and blood pressure measurements. It is especially important for the doctor to make sure your hemoglobin level is in the desired range, to limit the risk of potential side effects and to give you the best benefit. Keep all appointments for any recommended tests. Check your blood pressure as directed. Ask your doctor what your blood pressure should be and when you should contact him or her. As your body makes more red blood cells, you may need to take iron, folic acid, or vitamin B supplements. Ask your doctor or health care provider which products are right for you. If you have kidney disease continue dietary restrictions, even though this medication can make you feel better. Talk with your doctor or health   care professional about the foods you eat and the vitamins that you take. What side effects may I notice from receiving this medicine? Side effects that you should report to your doctor or health care professional as soon as possible: -allergic reactions like skin rash, itching or hives, swelling of the face, lips, or tongue -breathing problems -changes in vision -chest  pain -confusion, trouble speaking or understanding -feeling faint or lightheaded, falls -high blood pressure -muscle aches or pains -pain, swelling, warmth in the leg -rapid weight gain -severe headaches -sudden numbness or weakness of the face, arm or leg -trouble walking, dizziness, loss of balance or coordination -seizures (convulsions) -swelling of the ankles, feet, hands -unusually weak or tired Side effects that usually do not require medical attention (report to your doctor or health care professional if they continue or are bothersome): -diarrhea -fever, chills (flu-like symptoms) -headaches -nausea, vomiting -redness, stinging, or swelling at site where injected This list may not describe all possible side effects. Call your doctor for medical advice about side effects. You may report side effects to FDA at 1-800-FDA-1088. Where should I keep my medicine? Keep out of the reach of children. Store in a refrigerator between 2 and 8 degrees C (36 and 46 degrees F). Do not freeze. Do not shake. Throw away any unused portion if using a single-dose vial. Throw away any unused medicine after the expiration date. NOTE: This sheet is a summary. It may not cover all possible information. If you have questions about this medicine, talk to your doctor, pharmacist, or health care provider.  2015, Elsevier/Gold Standard. (2008-11-05 10:23:57)  

## 2014-11-14 ENCOUNTER — Other Ambulatory Visit (HOSPITAL_BASED_OUTPATIENT_CLINIC_OR_DEPARTMENT_OTHER): Payer: Medicare Other

## 2014-11-14 ENCOUNTER — Other Ambulatory Visit: Payer: Self-pay | Admitting: Hematology and Oncology

## 2014-11-14 ENCOUNTER — Ambulatory Visit (HOSPITAL_BASED_OUTPATIENT_CLINIC_OR_DEPARTMENT_OTHER): Payer: Medicare Other

## 2014-11-14 DIAGNOSIS — D696 Thrombocytopenia, unspecified: Secondary | ICD-10-CM

## 2014-11-14 DIAGNOSIS — D46Z Other myelodysplastic syndromes: Secondary | ICD-10-CM

## 2014-11-14 DIAGNOSIS — G44011 Episodic cluster headache, intractable: Secondary | ICD-10-CM

## 2014-11-14 DIAGNOSIS — D4620 Refractory anemia with excess of blasts, unspecified: Secondary | ICD-10-CM

## 2014-11-14 LAB — CBC WITH DIFFERENTIAL/PLATELET
BASO%: 0.3 % (ref 0.0–2.0)
Basophils Absolute: 0 10*3/uL (ref 0.0–0.1)
EOS ABS: 0 10*3/uL (ref 0.0–0.5)
EOS%: 0 % (ref 0.0–7.0)
HCT: 29.3 % — ABNORMAL LOW (ref 38.4–49.9)
HGB: 9.3 g/dL — ABNORMAL LOW (ref 13.0–17.1)
LYMPH%: 22.6 % (ref 14.0–49.0)
MCH: 25.1 pg — ABNORMAL LOW (ref 27.2–33.4)
MCHC: 31.7 g/dL — ABNORMAL LOW (ref 32.0–36.0)
MCV: 79.2 fL — AB (ref 79.3–98.0)
MONO#: 0.9 10*3/uL (ref 0.1–0.9)
MONO%: 7.8 % (ref 0.0–14.0)
NEUT#: 7.7 10*3/uL — ABNORMAL HIGH (ref 1.5–6.5)
NEUT%: 69.3 % (ref 39.0–75.0)
NRBC: 0 % (ref 0–0)
Platelets: 68 10*3/uL — ABNORMAL LOW (ref 140–400)
RBC: 3.7 10*6/uL — AB (ref 4.20–5.82)
RDW: 27.9 % — AB (ref 11.0–14.6)
WBC: 11.1 10*3/uL — AB (ref 4.0–10.3)
lymph#: 2.5 10*3/uL (ref 0.9–3.3)

## 2014-11-14 LAB — TECHNOLOGIST REVIEW

## 2014-11-14 MED ORDER — BUTALBITAL-APAP-CAFFEINE 50-325-40 MG PO TABS: 1.0000 | ORAL_TABLET | Freq: Four times a day (QID) | ORAL | Status: DC | PRN

## 2014-11-14 MED ORDER — DARBEPOETIN ALFA 500 MCG/ML IJ SOSY
500.0000 ug | PREFILLED_SYRINGE | Freq: Once | INTRAMUSCULAR | Status: AC
  Administered 2014-11-14: 500 ug via SUBCUTANEOUS
  Filled 2014-11-14: qty 1

## 2014-11-14 NOTE — Patient Instructions (Signed)
Darbepoetin Alfa injection What is this medicine? DARBEPOETIN ALFA (dar be POE e tin AL fa) helps your body make more red blood cells. It is used to treat anemia caused by chronic kidney failure and chemotherapy. This medicine may be used for other purposes; ask your health care provider or pharmacist if you have questions. COMMON BRAND NAME(S): Aranesp What should I tell my health care provider before I take this medicine? They need to know if you have any of these conditions: -blood clotting disorders or history of blood clots -cancer patient not on chemotherapy -cystic fibrosis -heart disease, such as angina, heart failure, or a history of a heart attack -hemoglobin level of 12 g/dL or greater -high blood pressure -low levels of folate, iron, or vitamin B12 -seizures -an unusual or allergic reaction to darbepoetin, erythropoietin, albumin, hamster proteins, latex, other medicines, foods, dyes, or preservatives -pregnant or trying to get pregnant -breast-feeding How should I use this medicine? This medicine is for injection into a vein or under the skin. It is usually given by a health care professional in a hospital or clinic setting. If you get this medicine at home, you will be taught how to prepare and give this medicine. Do not shake the solution before you withdraw a dose. Use exactly as directed. Take your medicine at regular intervals. Do not take your medicine more often than directed. It is important that you put your used needles and syringes in a special sharps container. Do not put them in a trash can. If you do not have a sharps container, call your pharmacist or healthcare provider to get one. Talk to your pediatrician regarding the use of this medicine in children. While this medicine may be used in children as young as 1 year for selected conditions, precautions do apply. Overdosage: If you think you have taken too much of this medicine contact a poison control center or  emergency room at once. NOTE: This medicine is only for you. Do not share this medicine with others. What if I miss a dose? If you miss a dose, take it as soon as you can. If it is almost time for your next dose, take only that dose. Do not take double or extra doses. What may interact with this medicine? Do not take this medicine with any of the following medications: -epoetin alfa This list may not describe all possible interactions. Give your health care provider a list of all the medicines, herbs, non-prescription drugs, or dietary supplements you use. Also tell them if you smoke, drink alcohol, or use illegal drugs. Some items may interact with your medicine. What should I watch for while using this medicine? Visit your prescriber or health care professional for regular checks on your progress and for the needed blood tests and blood pressure measurements. It is especially important for the doctor to make sure your hemoglobin level is in the desired range, to limit the risk of potential side effects and to give you the best benefit. Keep all appointments for any recommended tests. Check your blood pressure as directed. Ask your doctor what your blood pressure should be and when you should contact him or her. As your body makes more red blood cells, you may need to take iron, folic acid, or vitamin B supplements. Ask your doctor or health care provider which products are right for you. If you have kidney disease continue dietary restrictions, even though this medication can make you feel better. Talk with your doctor or health   care professional about the foods you eat and the vitamins that you take. What side effects may I notice from receiving this medicine? Side effects that you should report to your doctor or health care professional as soon as possible: -allergic reactions like skin rash, itching or hives, swelling of the face, lips, or tongue -breathing problems -changes in vision -chest  pain -confusion, trouble speaking or understanding -feeling faint or lightheaded, falls -high blood pressure -muscle aches or pains -pain, swelling, warmth in the leg -rapid weight gain -severe headaches -sudden numbness or weakness of the face, arm or leg -trouble walking, dizziness, loss of balance or coordination -seizures (convulsions) -swelling of the ankles, feet, hands -unusually weak or tired Side effects that usually do not require medical attention (report to your doctor or health care professional if they continue or are bothersome): -diarrhea -fever, chills (flu-like symptoms) -headaches -nausea, vomiting -redness, stinging, or swelling at site where injected This list may not describe all possible side effects. Call your doctor for medical advice about side effects. You may report side effects to FDA at 1-800-FDA-1088. Where should I keep my medicine? Keep out of the reach of children. Store in a refrigerator between 2 and 8 degrees C (36 and 46 degrees F). Do not freeze. Do not shake. Throw away any unused portion if using a single-dose vial. Throw away any unused medicine after the expiration date. NOTE: This sheet is a summary. It may not cover all possible information. If you have questions about this medicine, talk to your doctor, pharmacist, or health care provider.  2015, Elsevier/Gold Standard. (2008-11-05 10:23:57)  

## 2014-11-14 NOTE — Progress Notes (Signed)
Pt complains of headache since last time receiving aranesp injection. States that his BP had been elevated in the 161W systolic.  BP today 125/53.  Dr Alvy Bimler made aware of pt concerns states that she can prescribe him Fioricet.  Pt declined prescription.  States that he has tramadol that he can take and he will try that.

## 2014-12-05 ENCOUNTER — Other Ambulatory Visit (HOSPITAL_BASED_OUTPATIENT_CLINIC_OR_DEPARTMENT_OTHER): Payer: Medicare Other

## 2014-12-05 ENCOUNTER — Ambulatory Visit (HOSPITAL_BASED_OUTPATIENT_CLINIC_OR_DEPARTMENT_OTHER): Payer: Medicare Other

## 2014-12-05 DIAGNOSIS — D4620 Refractory anemia with excess of blasts, unspecified: Secondary | ICD-10-CM

## 2014-12-05 DIAGNOSIS — D696 Thrombocytopenia, unspecified: Secondary | ICD-10-CM

## 2014-12-05 DIAGNOSIS — D630 Anemia in neoplastic disease: Secondary | ICD-10-CM

## 2014-12-05 LAB — CBC WITH DIFFERENTIAL/PLATELET
BASO%: 0.3 % (ref 0.0–2.0)
BASOS ABS: 0 10*3/uL (ref 0.0–0.1)
EOS%: 0 % (ref 0.0–7.0)
Eosinophils Absolute: 0 10*3/uL (ref 0.0–0.5)
HCT: 28.7 % — ABNORMAL LOW (ref 38.4–49.9)
HEMOGLOBIN: 9 g/dL — AB (ref 13.0–17.1)
LYMPH%: 19.4 % (ref 14.0–49.0)
MCH: 24.7 pg — ABNORMAL LOW (ref 27.2–33.4)
MCHC: 31.4 g/dL — ABNORMAL LOW (ref 32.0–36.0)
MCV: 78.6 fL — AB (ref 79.3–98.0)
MONO#: 1.1 10*3/uL — ABNORMAL HIGH (ref 0.1–0.9)
MONO%: 10.7 % (ref 0.0–14.0)
NEUT%: 69.6 % (ref 39.0–75.0)
NEUTROS ABS: 7.3 10*3/uL — AB (ref 1.5–6.5)
PLATELETS: 64 10*3/uL — AB (ref 140–400)
RBC: 3.65 10*6/uL — ABNORMAL LOW (ref 4.20–5.82)
RDW: 27.5 % — ABNORMAL HIGH (ref 11.0–14.6)
WBC: 10.5 10*3/uL — ABNORMAL HIGH (ref 4.0–10.3)
lymph#: 2 10*3/uL (ref 0.9–3.3)
nRBC: 0 % (ref 0–0)

## 2014-12-05 LAB — TECHNOLOGIST REVIEW

## 2014-12-05 MED ORDER — DARBEPOETIN ALFA 500 MCG/ML IJ SOSY
500.0000 ug | PREFILLED_SYRINGE | Freq: Once | INTRAMUSCULAR | Status: AC
  Administered 2014-12-05: 500 ug via SUBCUTANEOUS
  Filled 2014-12-05: qty 1

## 2014-12-05 NOTE — Patient Instructions (Signed)
Darbepoetin Alfa injection What is this medicine? DARBEPOETIN ALFA (dar be POE e tin AL fa) helps your body make more red blood cells. It is used to treat anemia caused by chronic kidney failure and chemotherapy. This medicine may be used for other purposes; ask your health care provider or pharmacist if you have questions. COMMON BRAND NAME(S): Aranesp What should I tell my health care provider before I take this medicine? They need to know if you have any of these conditions: -blood clotting disorders or history of blood clots -cancer patient not on chemotherapy -cystic fibrosis -heart disease, such as angina, heart failure, or a history of a heart attack -hemoglobin level of 12 g/dL or greater -high blood pressure -low levels of folate, iron, or vitamin B12 -seizures -an unusual or allergic reaction to darbepoetin, erythropoietin, albumin, hamster proteins, latex, other medicines, foods, dyes, or preservatives -pregnant or trying to get pregnant -breast-feeding How should I use this medicine? This medicine is for injection into a vein or under the skin. It is usually given by a health care professional in a hospital or clinic setting. If you get this medicine at home, you will be taught how to prepare and give this medicine. Do not shake the solution before you withdraw a dose. Use exactly as directed. Take your medicine at regular intervals. Do not take your medicine more often than directed. It is important that you put your used needles and syringes in a special sharps container. Do not put them in a trash can. If you do not have a sharps container, call your pharmacist or healthcare provider to get one. Talk to your pediatrician regarding the use of this medicine in children. While this medicine may be used in children as young as 1 year for selected conditions, precautions do apply. Overdosage: If you think you have taken too much of this medicine contact a poison control center or  emergency room at once. NOTE: This medicine is only for you. Do not share this medicine with others. What if I miss a dose? If you miss a dose, take it as soon as you can. If it is almost time for your next dose, take only that dose. Do not take double or extra doses. What may interact with this medicine? Do not take this medicine with any of the following medications: -epoetin alfa This list may not describe all possible interactions. Give your health care provider a list of all the medicines, herbs, non-prescription drugs, or dietary supplements you use. Also tell them if you smoke, drink alcohol, or use illegal drugs. Some items may interact with your medicine. What should I watch for while using this medicine? Visit your prescriber or health care professional for regular checks on your progress and for the needed blood tests and blood pressure measurements. It is especially important for the doctor to make sure your hemoglobin level is in the desired range, to limit the risk of potential side effects and to give you the best benefit. Keep all appointments for any recommended tests. Check your blood pressure as directed. Ask your doctor what your blood pressure should be and when you should contact him or her. As your body makes more red blood cells, you may need to take iron, folic acid, or vitamin B supplements. Ask your doctor or health care provider which products are right for you. If you have kidney disease continue dietary restrictions, even though this medication can make you feel better. Talk with your doctor or health   care professional about the foods you eat and the vitamins that you take. What side effects may I notice from receiving this medicine? Side effects that you should report to your doctor or health care professional as soon as possible: -allergic reactions like skin rash, itching or hives, swelling of the face, lips, or tongue -breathing problems -changes in vision -chest  pain -confusion, trouble speaking or understanding -feeling faint or lightheaded, falls -high blood pressure -muscle aches or pains -pain, swelling, warmth in the leg -rapid weight gain -severe headaches -sudden numbness or weakness of the face, arm or leg -trouble walking, dizziness, loss of balance or coordination -seizures (convulsions) -swelling of the ankles, feet, hands -unusually weak or tired Side effects that usually do not require medical attention (report to your doctor or health care professional if they continue or are bothersome): -diarrhea -fever, chills (flu-like symptoms) -headaches -nausea, vomiting -redness, stinging, or swelling at site where injected This list may not describe all possible side effects. Call your doctor for medical advice about side effects. You may report side effects to FDA at 1-800-FDA-1088. Where should I keep my medicine? Keep out of the reach of children. Store in a refrigerator between 2 and 8 degrees C (36 and 46 degrees F). Do not freeze. Do not shake. Throw away any unused portion if using a single-dose vial. Throw away any unused medicine after the expiration date. NOTE: This sheet is a summary. It may not cover all possible information. If you have questions about this medicine, talk to your doctor, pharmacist, or health care provider.  2015, Elsevier/Gold Standard. (2008-11-05 10:23:57)  

## 2014-12-26 ENCOUNTER — Ambulatory Visit (HOSPITAL_BASED_OUTPATIENT_CLINIC_OR_DEPARTMENT_OTHER): Payer: Medicare Other

## 2014-12-26 ENCOUNTER — Telehealth: Payer: Self-pay | Admitting: *Deleted

## 2014-12-26 ENCOUNTER — Other Ambulatory Visit (HOSPITAL_BASED_OUTPATIENT_CLINIC_OR_DEPARTMENT_OTHER): Payer: Medicare Other

## 2014-12-26 DIAGNOSIS — D696 Thrombocytopenia, unspecified: Secondary | ICD-10-CM

## 2014-12-26 DIAGNOSIS — D649 Anemia, unspecified: Secondary | ICD-10-CM

## 2014-12-26 DIAGNOSIS — D4620 Refractory anemia with excess of blasts, unspecified: Secondary | ICD-10-CM

## 2014-12-26 DIAGNOSIS — D46Z Other myelodysplastic syndromes: Secondary | ICD-10-CM

## 2014-12-26 LAB — CBC WITH DIFFERENTIAL/PLATELET
BASO%: 0.2 % (ref 0.0–2.0)
BASOS ABS: 0 10*3/uL (ref 0.0–0.1)
EOS%: 0 % (ref 0.0–7.0)
Eosinophils Absolute: 0 10*3/uL (ref 0.0–0.5)
HCT: 27 % — ABNORMAL LOW (ref 38.4–49.9)
HGB: 8.6 g/dL — ABNORMAL LOW (ref 13.0–17.1)
LYMPH%: 26.7 % (ref 14.0–49.0)
MCH: 24.6 pg — ABNORMAL LOW (ref 27.2–33.4)
MCHC: 31.9 g/dL — AB (ref 32.0–36.0)
MCV: 77.4 fL — AB (ref 79.3–98.0)
MONO#: 0.9 10*3/uL (ref 0.1–0.9)
MONO%: 10.3 % (ref 0.0–14.0)
NEUT#: 5.6 10*3/uL (ref 1.5–6.5)
NEUT%: 62.8 % (ref 39.0–75.0)
NRBC: 0 % (ref 0–0)
RBC: 3.49 10*6/uL — ABNORMAL LOW (ref 4.20–5.82)
RDW: 27.8 % — AB (ref 11.0–14.6)
WBC: 9 10*3/uL (ref 4.0–10.3)
lymph#: 2.4 10*3/uL (ref 0.9–3.3)

## 2014-12-26 LAB — TECHNOLOGIST REVIEW

## 2014-12-26 MED ORDER — DARBEPOETIN ALFA 500 MCG/ML IJ SOSY
500.0000 ug | PREFILLED_SYRINGE | Freq: Once | INTRAMUSCULAR | Status: AC
  Administered 2014-12-26: 500 ug via SUBCUTANEOUS
  Filled 2014-12-26: qty 1

## 2014-12-26 NOTE — Telephone Encounter (Signed)
Called pt at home about his c/o headache earlier today to injection nurse.  Pt states headache is not bad all the time and he can take 1/2 tramadol and it relieves his headache.  He has been having some sinus problems and has been seen by PCP for this.  He recently completed a z-pack but is not sure it helped at all.  Informed pt his headaches can be worse due to anemia.  He verbalized understanding and states some concern about his hgb level dropping for past few visits.  Informed Dr. Alvy Bimler is aware of his results and will continue to monitor.   Keep next appt on 2/11 as scheduled but call us back if any of his symptoms worsen or he feels he needs to be checked sooner.  Pt verbalized understanding.

## 2014-12-30 ENCOUNTER — Telehealth: Payer: Self-pay | Admitting: *Deleted

## 2014-12-30 NOTE — Telephone Encounter (Addendum)
NOTIFIED PT. OF DR.GORSUCH'S RESPONSE PARTICULARLY THAT HE NEEDS AN URGENT PRIMARY CARE PHYSICIAN EVALUATION. HE VOICES UNDERSTANDING.

## 2014-12-30 NOTE — Telephone Encounter (Signed)
I am more concerned of his worsening anemia and the chest pain could be triggered by the anemia. He needs urgent PCP eval

## 2014-12-30 NOTE — Telephone Encounter (Signed)
PT. GOT UP TO THE BATHROOM WHEN HE HAD RIGHTED SIDED CHEST PAIN WHICH RADIATED DOWN HIS RIGHT ARM, UP HIS NECK AND AROUND HIS EAR.B/P 180/80 PT.'S HEART WAS BEATING VERY FAST. HE LAID DOWN FOR TWO TO THREE MINUTES AND "THE PAIN WENT AWAY". THIS MORNING PT.'S B/P 138/64 PULSE 56. PT. WANTED Ord NOTIFIED AND TO ASK IF THIS IS RELATED TO HIS "SHOT" OR SOMETHING ELSE.

## 2014-12-31 ENCOUNTER — Encounter: Payer: Self-pay | Admitting: Physician Assistant

## 2014-12-31 ENCOUNTER — Telehealth: Payer: Self-pay | Admitting: Hematology and Oncology

## 2014-12-31 ENCOUNTER — Ambulatory Visit (INDEPENDENT_AMBULATORY_CARE_PROVIDER_SITE_OTHER): Payer: Medicare Other | Admitting: Physician Assistant

## 2014-12-31 ENCOUNTER — Telehealth: Payer: Self-pay | Admitting: Interventional Cardiology

## 2014-12-31 ENCOUNTER — Encounter: Payer: Self-pay | Admitting: *Deleted

## 2014-12-31 VITALS — BP 112/65 | HR 64 | Ht 68.0 in | Wt 199.0 lb

## 2014-12-31 DIAGNOSIS — I10 Essential (primary) hypertension: Secondary | ICD-10-CM

## 2014-12-31 DIAGNOSIS — D630 Anemia in neoplastic disease: Secondary | ICD-10-CM

## 2014-12-31 DIAGNOSIS — I471 Supraventricular tachycardia: Secondary | ICD-10-CM

## 2014-12-31 DIAGNOSIS — R079 Chest pain, unspecified: Secondary | ICD-10-CM

## 2014-12-31 LAB — CBC WITH DIFFERENTIAL/PLATELET
Basophils Absolute: 0 10*3/uL (ref 0.0–0.1)
Basophils Relative: 0 % (ref 0.0–3.0)
EOS PCT: 0 % (ref 0.0–5.0)
Eosinophils Absolute: 0 10*3/uL (ref 0.0–0.7)
HEMATOCRIT: 30.2 % — AB (ref 39.0–52.0)
Hemoglobin: 9.7 g/dL — ABNORMAL LOW (ref 13.0–17.0)
LYMPHS PCT: 12.1 % (ref 12.0–46.0)
Lymphs Abs: 1.9 10*3/uL (ref 0.7–4.0)
MCHC: 32.1 g/dL (ref 30.0–36.0)
MCV: 78.8 fl (ref 78.0–100.0)
Monocytes Absolute: 1.9 10*3/uL — ABNORMAL HIGH (ref 0.1–1.0)
Monocytes Relative: 12.1 % — ABNORMAL HIGH (ref 3.0–12.0)
NEUTROS ABS: 12 10*3/uL — AB (ref 1.4–7.7)
NEUTROS PCT: 75.8 % (ref 43.0–77.0)
Platelets: 82 10*3/uL — ABNORMAL LOW (ref 150.0–400.0)
RBC: 3.83 Mil/uL — ABNORMAL LOW (ref 4.22–5.81)
RDW: 32 % — ABNORMAL HIGH (ref 11.5–15.5)
WBC: 15.8 10*3/uL — AB (ref 4.0–10.5)

## 2014-12-31 LAB — PROTIME-INR
INR: 1.1 ratio — AB (ref 0.8–1.0)
Prothrombin Time: 12.7 s (ref 9.6–13.1)

## 2014-12-31 MED ORDER — ISOSORBIDE MONONITRATE ER 30 MG PO TB24: 15.0000 mg | ORAL_TABLET | Freq: Every day | ORAL | Status: DC

## 2014-12-31 MED ORDER — ASPIRIN EC 81 MG PO TBEC: 81.0000 mg | DELAYED_RELEASE_TABLET | Freq: Every day | ORAL | Status: DC

## 2014-12-31 MED ORDER — NITROGLYCERIN 0.4 MG SL SUBL: 0.4000 mg | SUBLINGUAL_TABLET | SUBLINGUAL | Status: DC | PRN

## 2014-12-31 NOTE — Patient Instructions (Signed)
Your physician has recommended you make the following change in your medication:  1. START IMDUR 30 MG TABLET YOUR DIRECTIONS ARE TO TAKE 1/2 TABLET DAILY = 15 MG DAILY 2. AN RX FOR NITROGLYCERIN HAS BEEN SENT IN AND YOU HAVE BEEN ADVISED AS TO HOW AND WHEN TO USE NTG  Your physician has requested that you have a cardiac catheterization. Cardiac catheterization is used to diagnose and/or treat various heart conditions. Doctors may recommend this procedure for a number of different reasons. The most common reason is to evaluate chest pain. Chest pain can be a symptom of coronary artery disease (CAD), and cardiac catheterization can show whether plaque is narrowing or blocking your heart's arteries. This procedure is also used to evaluate the valves, as well as measure the blood flow and oxygen levels in different parts of your heart. For further information please visit [email or web address]. Please follow instruction sheet, as given.  Your physician recommends that you return for lab work in: TODAY BMET, CBC W/DIFF, PT/INR

## 2014-12-31 NOTE — Progress Notes (Signed)
Cardiology Office Note   Date:  12/31/2014   ID:  Daelyn, Mozer [birth year], MRN [id number]  PCP:  Lilian Coma, MD  Cardiologist:  Dr. Casandra Doffing    Chief Complaint  Patient presents with  . Chest Pain  . Abnormal ECG  . Palpitations  . Hypertension     History of Present Illness: BENEN WEIDA is a 69 y.o. male who presents for evaluation of the above.  He has a hx of PSVT, myelodysplastic syndrome, HTN, RA.  He is followed by Dr. Alvy Bimler at [facility]c.  He receives regular Aranesp injections.  He saw his PCP yesterday for chest pain and rapid palpitations and elevated BP after getting his most recent Aranesp injection.  ECG demonstrated new TWI and he was asked to FU here.    He has noted chest discomfort since the dose of his Aranesp increased several months ago.  He describes R sided chest heaviness/tightness with radiation to his R shoulder and up into his R neck.  His symptoms are much worse the first few days after his injection.  He notes symptoms with exertion.  They are improved with rest.  He is probably describing CCS class 2-3 symptoms.  He has chronic anemia and notes chronic dyspnea associated with this.  His dyspnea seems to be only somewhat worse.  He also notes rapid heartbeat for a few days after his injection.  He thinks this may be like his prior SVT.  He notes worse chest pain with this.  He denies assoc nausea, diaphoresis with his CP.  He denies syncope.  He denies orthopnea, PND, edema.     Studies/Reports Reviewed Today:  - Echocardiogram (11/15/12):  EF 60-65%, mild LAE, Ao sclerosis, mild TR, mildly elevated RVSP (45 mmHg), impaired relaxation.   Past Medical History  Diagnosis Date  . MDS (myelodysplastic syndrome), low grade 2012    on observation; MDS FISH panel on 04/26/2013 was normal; cytogenetics were also negative.   . Rheumatoid arthritis(714.0)     was on MTX until MDS dx  . Hypertension   . Anemia   . Family history of  ischemic heart disease   . Shortness of breath     exertional  . Dysrhythmia   . Tachycardia 2013    evaluated by Dr Irish Lack @ [facility]  . Gout   . Needs flu shot 08/23/2013  . Headache 03/07/2014    Past Surgical History  Procedure Laterality Date  . Knee arthroscopy Left   . Hernia repair    . Umbilical hernia repair    . Circumcision    . 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 acetaminophen (TYLENOL) 500 MG tablet Take 1,000 mg by mouth every 6 (six) hours as needed for pain.    Marland Kitchen alendronate (FOSAMAX) 70 MG tablet Take 70 mg by mouth once a week. Take with a full glass of water on an empty stomach.    Marland Kitchen allopurinol (ZYLOPRIM) 100 MG tablet Take 100 mg by mouth daily.    . butalbital-acetaminophen-caffeine (FIORICET) 50-325-40 MG per tablet Take 1 tablet by mouth every 6 (six) hours as needed for headache. 60 tablet 0  . Calcium-Vitamin D (CALTRATE 600  PLUS-VIT D PO) Take 1 tablet by mouth 2 (two) times daily.     . cyclobenzaprine (FLEXERIL) 10 MG tablet Take 10 mg by mouth as needed.    Marland Kitchen lisinopril-hydrochlorothiazide (PRINZIDE,ZESTORETIC) 20-25 MG per tablet Take 0.5 tablets by mouth daily.     Marland Kitchen loratadine (CLARITIN) 10 MG tablet Take 10 mg by mouth daily.    . metoprolol (LOPRESSOR) 50 MG tablet take 1/2 tablet by mouth twice a day 30 tablet 6  . Omega-3 Fatty Acids (FISH OIL) 1000 MG CAPS Take 2,000 mg by mouth 2 (two) times daily.    . predniSONE (DELTASONE) 5 MG tablet Take 9 mg by mouth daily.     . traMADol (ULTRAM) 50 MG tablet Take 50-100 mg by mouth every 4 (four) hours as needed for pain.     No current facility-administered medications for  this visit.    Allergies:   Review of patient's allergies indicates no known allergies.    Social History:  The patient  reports that he quit smoking about 44 years ago. His smoking use included Cigarettes. He has never used smokeless tobacco. He reports that he does not drink alcohol or use illicit drugs.   Family History:  The patient's family history includes Brain cancer in his mother; Breast cancer in his sister; Diabetes in his brother; Heart attack in his brother and father; Heart disease in his brother and father. There is no history of Stroke.    ROS:  Please see the history of present illness.   Otherwise, review of systems are positive for Headaches, fatigue, hearing loss, recent cough (treated with antibiotics), snoring, easy bruising.   All other systems are reviewed and negative.    PHYSICAL EXAM: VS:  BP 112/65 mmHg  Pulse 64  Ht 5\' 8"  (1.727 m)  Wt 199 lb (90.266 kg)  BMI 30.26 kg/m2    Wt Readings from Last 3 Encounters:  12/05/14 203 lb 12.8 oz (92.443 kg)  10/03/14 200 lb 14.4 oz (91.128 kg)  06/20/14 205 lb (92.987 kg)     GEN: Well nourished, well developed, in no acute distress HEENT: normal Neck: no JVD, no carotid bruits, no masses Cardiac:  Normal S1/S2, RRR; no murmur,  no rubs or gallops, no edema  Respiratory:  clear to auscultation bilaterally, no wheezing, rhonchi or rales. GI: soft, nontender, nondistended, + BS MS: no deformity or atrophy Skin: warm and dry  Neuro:  CNs II-XII intact, Strength and sensation are intact Psych: Normal affect   EKG:  EKG is ordered today.  It demonstrates:   NSR HR 64, normal axis, T-wave inversions in 2, 3, aVF, V5, V6; TWI new since last tracing.   Recent Labs: 05/30/2014: BUN 26.2*; Creatinine 0.8; Potassium 3.7; Sodium 138 12/26/2014: Hemoglobin 8.6*; Platelets 84 Large & giant platelets*    Recent Labs  11/14/14 0756 12/05/14 0757 12/26/14 0755  WBC 11.1* 10.5* 9.0  HGB 9.3* 9.0* 8.6*  PLT 68* 64*  84 Large & giant platelets*    Lipid Panel No results found for: CHOL, TRIG, HDL, CHOLHDL, VLDL, LDLCALC, LDLDIRECT    ASSESSMENT AND PLAN:  1.  Chest Pain:  His chest discomfort is concerning for CCS class 2-3 angina.  He has a lot of risk factors (HTN, prior smoking, FHx).  I spoke to his oncologist, Dr. Alvy Bimler by phone today.  His anemia has been getting slowly worse over time.  I suspect his angina is exacerbated by his anemia.  He has chronic thrombocytopenia,  but has generally stayed > 50K.  He would not meet criteria for transfusion with PRBCs unless his Hgb is < 8.  I have recommended proceeding with cardiac catheterization to further evaluate his symptoms.  Dr. Alvy Bimler felt that he is a reasonable candidate.  She noted that he could take dual antiplatelet therapy (should he require PCI) as long as his PLT count remains > 50K.  I have reviewed his case with Dr. Lauree Chandler (DOD) today as well who agreed with cardiac cath.    -  Continue ASA, beta blocker.      -  Add Imdur 15 mg QD (he does not take PDE-5 inhibitors) and NTG prn.    -  Arrange LHC in the next week with Dr. Casandra Doffing or Dr. Lauree Chandler.  Risks and benefits of cardiac catheterization have been discussed with the patient.  These include bleeding, infection, kidney damage, stroke, heart attack, death.  The patient understands these risks and is willing to proceed.     -  Pre-cath labs today:  BMET, CBC, INR. 2.  Hypertension:  BP is controlled.  He is tolerating his current therapy.   3.  Anemia:  Managed by oncology.  He receives Aranesp every 3 weeks.  4.  Paroxysmal SVT:  He has had some rapid palpitations.  If his cath demonstrates no obstructive CAD or if he continues to have palpitation after his cath, consider a 30 day event monitor.   5.  Snoring:  He may have symptoms of OSA.  Consider sleep study in the future.     Current medicines are reviewed at length with the patient today.  The patient  does not have concerns regarding medicines.  The following changes have been made:  As above.   Labs/ tests ordered today include:  Orders Placed This Encounter  Procedures  . INR/PT  . CBC w/Diff  . Basic Metabolic Panel (BMET)  . EKG 12-Lead    Disposition:   FU with Dr. Casandra Doffing after her cardiac catheterization.    Signed, Versie Starks, MHS 12/31/2014 11:34 AM    [facility] [place], [place],   [address] Phone: [phone]; Fax: [phone]

## 2014-12-31 NOTE — Telephone Encounter (Signed)
I spoke with the cardiologist's office. I suspect the cause of his chest pain and recent headache are related to anemia. There is no contraindication for him to proceed with cardiac intervention. As long as his count is above 50,000, the patient can receive dual antiplatelet agents.

## 2014-12-31 NOTE — Telephone Encounter (Signed)
Called and spoke with pt concerning his CP. Pt stated that he had sharp chest pains on Friday and Saturday night.  The pain radiated into his right arm, shoulder and jaw. Pt stated that he has injections every 3 weeks at the cancer center and that he has these type of side effects for 2-3 days after these injections. Pt stated that the symptoms seemed slightly worse than the typically do after these injections.   Pt stated that his BP was increased with this CP episodes.   Pt stated that Dr. Stephanie Acre prescribed him Xanax 0.25mg  and that he took a half tab last night and slept really well. Pt states that he does not have any symptoms today and that he is feeling much better. Spoke with Flex, Richardson Dopp, PA-C, and he said that the pt can come in today if he would like to be checked out. Spoke with pt and he was in agreement with this and scheduled an appt to come in at 1:30p.m.

## 2014-12-31 NOTE — Telephone Encounter (Signed)
NEw Message  Belenda Cruise [facility])([phone] called to make f/u appt for pt w/ Irish Lack for CP. Pt was given next available PA appt. (2/4 w/ Melina Copa) Belenda Cruise had requested that the RN speak w/ pt to determine severity: if pt needs earlier appt or w/ Irish Lack and not an APP. Please call back and discuss.

## 2015-01-01 ENCOUNTER — Telehealth: Payer: Self-pay | Admitting: *Deleted

## 2015-01-01 LAB — BASIC METABOLIC PANEL
BUN: 18 mg/dL (ref 6–23)
CO2: 28 mEq/L (ref 19–32)
Calcium: 9.7 mg/dL (ref 8.4–10.5)
Chloride: 101 mEq/L (ref 96–112)
Creatinine, Ser: 0.78 mg/dL (ref 0.40–1.50)
GFR: 104.96 mL/min (ref >60.00)
GLUCOSE: 96 mg/dL (ref 70–99)
Potassium: 4.1 mEq/L (ref 3.5–5.1)
Sodium: 137 mEq/L (ref 135–145)

## 2015-01-01 NOTE — Telephone Encounter (Signed)
pt notified about lab results. Pt denies any fevers, chills,dysuria. Pt said thank you .

## 2015-01-07 ENCOUNTER — Encounter (HOSPITAL_COMMUNITY): Payer: Self-pay | Admitting: Pharmacy Technician

## 2015-01-09 ENCOUNTER — Encounter (HOSPITAL_COMMUNITY)
Admission: RE | Disposition: A | Discharge status: Home / Self Care | Payer: Self-pay | Source: Ambulatory Visit | Attending: Cardiothoracic Surgery

## 2015-01-09 ENCOUNTER — Other Ambulatory Visit: Payer: Self-pay | Admitting: *Deleted

## 2015-01-09 ENCOUNTER — Ambulatory Visit: Payer: Medicare Other | Admitting: Physician Assistant

## 2015-01-09 ENCOUNTER — Inpatient Hospital Stay (HOSPITAL_COMMUNITY)
Admission: RE | Admit: 2015-01-09 | Discharge: 2015-01-23 | DRG: 234 | Disposition: A | Discharge status: Home / Self Care | Payer: Medicare Other | Source: Ambulatory Visit | Attending: Cardiothoracic Surgery | Admitting: Cardiothoracic Surgery

## 2015-01-09 ENCOUNTER — Encounter (HOSPITAL_COMMUNITY): Payer: Self-pay | Admitting: Cardiovascular Disease

## 2015-01-09 DIAGNOSIS — I700 Atherosclerosis of aorta: Secondary | ICD-10-CM | POA: Diagnosis present

## 2015-01-09 DIAGNOSIS — I25110 Atherosclerotic heart disease of native coronary artery with unstable angina pectoris: Principal | ICD-10-CM | POA: Diagnosis present

## 2015-01-09 DIAGNOSIS — E8770 Fluid overload, unspecified: Secondary | ICD-10-CM | POA: Diagnosis not present

## 2015-01-09 DIAGNOSIS — D46Z Other myelodysplastic syndromes: Secondary | ICD-10-CM | POA: Diagnosis present

## 2015-01-09 DIAGNOSIS — I10 Essential (primary) hypertension: Secondary | ICD-10-CM | POA: Diagnosis present

## 2015-01-09 DIAGNOSIS — I6523 Occlusion and stenosis of bilateral carotid arteries: Secondary | ICD-10-CM | POA: Diagnosis present

## 2015-01-09 DIAGNOSIS — K567 Ileus, unspecified: Secondary | ICD-10-CM

## 2015-01-09 DIAGNOSIS — Z951 Presence of aortocoronary bypass graft: Secondary | ICD-10-CM

## 2015-01-09 DIAGNOSIS — M109 Gout, unspecified: Secondary | ICD-10-CM | POA: Diagnosis present

## 2015-01-09 DIAGNOSIS — N990 Postprocedural (acute) (chronic) kidney failure: Secondary | ICD-10-CM | POA: Diagnosis not present

## 2015-01-09 DIAGNOSIS — D4620 Refractory anemia with excess of blasts, unspecified: Secondary | ICD-10-CM | POA: Diagnosis present

## 2015-01-09 DIAGNOSIS — I481 Persistent atrial fibrillation: Secondary | ICD-10-CM | POA: Diagnosis not present

## 2015-01-09 DIAGNOSIS — D62 Acute posthemorrhagic anemia: Secondary | ICD-10-CM | POA: Diagnosis not present

## 2015-01-09 DIAGNOSIS — N179 Acute kidney failure, unspecified: Secondary | ICD-10-CM | POA: Diagnosis not present

## 2015-01-09 DIAGNOSIS — Z833 Family history of diabetes mellitus: Secondary | ICD-10-CM

## 2015-01-09 DIAGNOSIS — R0902 Hypoxemia: Secondary | ICD-10-CM

## 2015-01-09 DIAGNOSIS — I480 Paroxysmal atrial fibrillation: Secondary | ICD-10-CM

## 2015-01-09 DIAGNOSIS — Z96651 Presence of right artificial knee joint: Secondary | ICD-10-CM | POA: Diagnosis present

## 2015-01-09 DIAGNOSIS — D630 Anemia in neoplastic disease: Secondary | ICD-10-CM | POA: Diagnosis present

## 2015-01-09 DIAGNOSIS — I200 Unstable angina: Secondary | ICD-10-CM | POA: Diagnosis present

## 2015-01-09 DIAGNOSIS — D469 Myelodysplastic syndrome, unspecified: Secondary | ICD-10-CM | POA: Diagnosis present

## 2015-01-09 DIAGNOSIS — E785 Hyperlipidemia, unspecified: Secondary | ICD-10-CM | POA: Diagnosis present

## 2015-01-09 DIAGNOSIS — D61818 Other pancytopenia: Secondary | ICD-10-CM | POA: Diagnosis present

## 2015-01-09 DIAGNOSIS — Z87891 Personal history of nicotine dependence: Secondary | ICD-10-CM

## 2015-01-09 DIAGNOSIS — M069 Rheumatoid arthritis, unspecified: Secondary | ICD-10-CM | POA: Diagnosis present

## 2015-01-09 DIAGNOSIS — D689 Coagulation defect, unspecified: Secondary | ICD-10-CM | POA: Diagnosis present

## 2015-01-09 DIAGNOSIS — I208 Other forms of angina pectoris: Secondary | ICD-10-CM

## 2015-01-09 DIAGNOSIS — D696 Thrombocytopenia, unspecified: Secondary | ICD-10-CM | POA: Diagnosis present

## 2015-01-09 DIAGNOSIS — R0602 Shortness of breath: Secondary | ICD-10-CM

## 2015-01-09 DIAGNOSIS — I471 Supraventricular tachycardia: Secondary | ICD-10-CM | POA: Diagnosis present

## 2015-01-09 DIAGNOSIS — I2510 Atherosclerotic heart disease of native coronary artery without angina pectoris: Secondary | ICD-10-CM

## 2015-01-09 DIAGNOSIS — Z79899 Other long term (current) drug therapy: Secondary | ICD-10-CM

## 2015-01-09 DIAGNOSIS — K913 Postprocedural intestinal obstruction: Secondary | ICD-10-CM | POA: Diagnosis not present

## 2015-01-09 DIAGNOSIS — Z7982 Long term (current) use of aspirin: Secondary | ICD-10-CM

## 2015-01-09 DIAGNOSIS — R079 Chest pain, unspecified: Secondary | ICD-10-CM

## 2015-01-09 DIAGNOSIS — Z8249 Family history of ischemic heart disease and other diseases of the circulatory system: Secondary | ICD-10-CM

## 2015-01-09 DIAGNOSIS — Z7952 Long term (current) use of systemic steroids: Secondary | ICD-10-CM

## 2015-01-09 DIAGNOSIS — D6959 Other secondary thrombocytopenia: Secondary | ICD-10-CM | POA: Diagnosis present

## 2015-01-09 HISTORY — DX: Atherosclerotic heart disease of native coronary artery without angina pectoris: I25.10

## 2015-01-09 HISTORY — PX: LEFT HEART CATHETERIZATION WITH CORONARY ANGIOGRAM: SHX5451

## 2015-01-09 LAB — CBC
HEMATOCRIT: 26.3 % — AB (ref 39.0–52.0)
Hemoglobin: 8.6 g/dL — ABNORMAL LOW (ref 13.0–17.0)
MCH: 24.9 pg — ABNORMAL LOW (ref 26.0–34.0)
MCHC: 32.7 g/dL (ref 30.0–36.0)
MCV: 76 fL — AB (ref 78.0–100.0)
PLATELETS: 31 10*3/uL — AB (ref 150–400)
RBC: 3.46 MIL/uL — AB (ref 4.22–5.81)
RDW: 27.5 % — AB (ref 11.5–15.5)
WBC: 5.2 10*3/uL (ref 4.0–10.5)

## 2015-01-09 LAB — URINALYSIS, ROUTINE W REFLEX MICROSCOPIC
Bilirubin Urine: NEGATIVE
Glucose, UA: NEGATIVE mg/dL
Hgb urine dipstick: NEGATIVE
Ketones, ur: NEGATIVE mg/dL
Leukocytes, UA: NEGATIVE
Nitrite: NEGATIVE
Protein, ur: NEGATIVE mg/dL
Specific Gravity, Urine: 1.013 (ref 1.005–1.030)
Urobilinogen, UA: 1 mg/dL (ref 0.0–1.0)
pH: 6.5 (ref 5.0–8.0)

## 2015-01-09 LAB — SURGICAL PCR SCREEN
MRSA, PCR: NEGATIVE
Staphylococcus aureus: NEGATIVE

## 2015-01-09 SURGERY — LEFT HEART CATHETERIZATION WITH CORONARY ANGIOGRAM
Anesthesia: LOCAL

## 2015-01-09 MED ORDER — ATORVASTATIN CALCIUM 80 MG PO TABS
80.0000 mg | ORAL_TABLET | Freq: Every day | ORAL | Status: DC
  Administered 2015-01-09 – 2015-01-22 (×13): 80 mg via ORAL
  Filled 2015-01-09 (×16): qty 1

## 2015-01-09 MED ORDER — LIDOCAINE HCL (PF) 1 % IJ SOLN
INTRAMUSCULAR | Status: AC
  Filled 2015-01-09: qty 30

## 2015-01-09 MED ORDER — SODIUM CHLORIDE 0.9 % IJ SOLN: 3.0000 mL | Freq: Two times a day (BID) | INTRAMUSCULAR | Status: DC

## 2015-01-09 MED ORDER — PREDNISONE 10 MG PO TABS
10.0000 mg | ORAL_TABLET | Freq: Every day | ORAL | Status: DC
  Administered 2015-01-09 – 2015-01-13 (×5): 10 mg via ORAL
  Filled 2015-01-09 (×9): qty 0.5

## 2015-01-09 MED ORDER — METOPROLOL TARTRATE 25 MG PO TABS
25.0000 mg | ORAL_TABLET | Freq: Two times a day (BID) | ORAL | Status: DC
  Administered 2015-01-09 – 2015-01-12 (×8): 25 mg via ORAL
  Filled 2015-01-09 (×11): qty 2

## 2015-01-09 MED ORDER — HEPARIN SODIUM (PORCINE) 1000 UNIT/ML IJ SOLN
INTRAMUSCULAR | Status: AC
  Filled 2015-01-09: qty 1

## 2015-01-09 MED ORDER — SODIUM CHLORIDE 0.9 % IV SOLN
INTRAVENOUS | Status: AC
  Administered 2015-01-09: 14:00:00 via INTRAVENOUS

## 2015-01-09 MED ORDER — LISINOPRIL 20 MG PO TABS
20.0000 mg | ORAL_TABLET | Freq: Every day | ORAL | Status: DC
  Administered 2015-01-09 – 2015-01-12 (×4): 20 mg via ORAL
  Filled 2015-01-09 (×5): qty 1

## 2015-01-09 MED ORDER — ASPIRIN 81 MG PO CHEW
CHEWABLE_TABLET | ORAL | Status: AC
  Administered 2015-01-09: 81 mg via ORAL
  Filled 2015-01-09: qty 1

## 2015-01-09 MED ORDER — NITROGLYCERIN 1 MG/10 ML FOR IR/CATH LAB
INTRA_ARTERIAL | Status: AC
  Filled 2015-01-09: qty 10

## 2015-01-09 MED ORDER — ASPIRIN 81 MG PO CHEW
81.0000 mg | CHEWABLE_TABLET | Freq: Every day | ORAL | Status: DC
  Administered 2015-01-10 – 2015-01-12 (×3): 81 mg via ORAL
  Filled 2015-01-09 (×3): qty 1

## 2015-01-09 MED ORDER — SODIUM CHLORIDE 0.9 % IV SOLN
INTRAVENOUS | Status: DC
  Administered 2015-01-09: 08:00:00 via INTRAVENOUS

## 2015-01-09 MED ORDER — ALLOPURINOL 100 MG PO TABS
100.0000 mg | ORAL_TABLET | Freq: Every day | ORAL | Status: DC
Start: 2015-01-09 — End: 2015-01-23
  Administered 2015-01-09 – 2015-01-23 (×14): 100 mg via ORAL
  Filled 2015-01-09 (×15): qty 1

## 2015-01-09 MED ORDER — MIDAZOLAM HCL 2 MG/2ML IJ SOLN
INTRAMUSCULAR | Status: AC
  Filled 2015-01-09: qty 2

## 2015-01-09 MED ORDER — FENTANYL CITRATE 0.05 MG/ML IJ SOLN
INTRAMUSCULAR | Status: AC
  Filled 2015-01-09: qty 2

## 2015-01-09 MED ORDER — VERAPAMIL HCL 2.5 MG/ML IV SOLN
INTRAVENOUS | Status: AC
  Filled 2015-01-09: qty 2

## 2015-01-09 MED ORDER — ACETAMINOPHEN 325 MG PO TABS
650.0000 mg | ORAL_TABLET | ORAL | Status: DC | PRN
  Administered 2015-01-10 – 2015-01-11 (×3): 650 mg via ORAL
  Filled 2015-01-09 (×3): qty 2

## 2015-01-09 MED ORDER — SODIUM CHLORIDE 0.9 % IV SOLN: 250.0000 mL | INTRAVENOUS | Status: DC | PRN

## 2015-01-09 MED ORDER — HEPARIN (PORCINE) IN NACL 2-0.9 UNIT/ML-% IJ SOLN
INTRAMUSCULAR | Status: AC
  Filled 2015-01-09: qty 1000

## 2015-01-09 MED ORDER — ISOSORBIDE MONONITRATE 15 MG HALF TABLET
15.0000 mg | ORAL_TABLET | Freq: Every day | ORAL | Status: DC
  Administered 2015-01-09 – 2015-01-12 (×4): 15 mg via ORAL
  Filled 2015-01-09 (×5): qty 1

## 2015-01-09 MED ORDER — ASPIRIN 81 MG PO CHEW
81.0000 mg | CHEWABLE_TABLET | ORAL | Status: AC
  Administered 2015-01-09: 81 mg via ORAL

## 2015-01-09 MED ORDER — LORATADINE 10 MG PO TABS
10.0000 mg | ORAL_TABLET | Freq: Every day | ORAL | Status: DC
  Administered 2015-01-09 – 2015-01-12 (×4): 10 mg via ORAL
  Filled 2015-01-09 (×5): qty 1

## 2015-01-09 MED ORDER — ALPRAZOLAM 0.25 MG PO TABS: 0.1250 mg | ORAL_TABLET | Freq: Two times a day (BID) | ORAL | Status: DC | PRN

## 2015-01-09 MED ORDER — ONDANSETRON HCL 4 MG/2ML IJ SOLN: 4.0000 mg | Freq: Four times a day (QID) | INTRAMUSCULAR | Status: DC | PRN

## 2015-01-09 MED ORDER — SODIUM CHLORIDE 0.9 % IJ SOLN: 3.0000 mL | INTRAMUSCULAR | Status: DC | PRN

## 2015-01-09 NOTE — Interval H&P Note (Signed)
History and Physical Interval Note:  01/09/2015 9:19 AM  Eric [name]  has presented today for cardiac cath with the diagnosis of chest pain, abnormal EKG.  The various methods of treatment have been discussed with the patient and family. After consideration of risks, benefits and other options for treatment, the patient has consented to  Procedure(s): LEFT HEART CATHETERIZATION WITH CORONARY ANGIOGRAM (N/A) as a surgical intervention .  The patient's history has been reviewed, patient examined, no change in status, stable for surgery.  I have reviewed the patient's chart and labs.  Questions were answered to the patient's satisfaction.    Cath Lab Visit (complete for each Cath Lab visit)  Clinical Evaluation Leading to the Procedure:   ACS: No.  Non-ACS:    Anginal Classification: CCS II  Anti-ischemic medical therapy: Maximal Therapy (2 or more classes of medications)  Non-Invasive Test Results: No non-invasive testing performed  Prior CABG: No previous CABG        MCALHANY,CHRISTOPHER

## 2015-01-09 NOTE — CV Procedure (Signed)
Cardiac Catheterization Operative Report  Eric [name] [id number] 2/4/20169:55 AM Lilian Coma, MD  Procedure Performed:  1. Left Heart Catheterization 2. Selective Coronary Angiography 3. Left ventricular angiogram  Operator: Lauree Chandler, MD  Arterial access site:  Right radial artery.   Indication: 69 yo male with history of PSVT, myelodysplastic syndrome, HTN, RA for diagnostic cardiac cath. He is followed by Dr. Alvy Bimler at [facility] for his myelodysplastic syndrome and by Dr. Irish Lack for cardiac issues. He receives regular Aranesp injections. He has had recent c/o chest pains and palpitations after Aranesp injections. He was seen in our office 12/31/14 by Richardson Dopp, PA-C. ECG demonstrated new TWI. Cardiac cath was planned to exclude CAD.                                      Procedure Details: The risks, benefits, complications, treatment options, and expected outcomes were discussed with the patient. The patient and/or family concurred with the proposed plan, giving informed consent. The patient was brought to the cath lab after IV hydration was begun and oral premedication was given. The patient was further sedated with Versed and Fentanyl. The right wrist was assessed with a modified Allens test which was positive. The right wrist was prepped and draped in a sterile fashion. 1% lidocaine was used for local anesthesia. Using the modified Seldinger access technique, a 5 French sheath was placed in the right radial artery. 3 mg Verapamil was given through the sheath. 4500 units IV heparin was given. Standard diagnostic catheters were used to perform selective coronary angiography. A pigtail catheter was used to perform a left ventricular angiogram. The sheath was removed from the right radial artery and a Terumo hemostasis band was applied at the arteriotomy site on the right wrist.   There were no immediate complications. The patient was taken to the recovery  area in stable condition.   Hemodynamic Findings: Central aortic pressure: 119/53 Left ventricular pressure: 115/4/17  Angiographic Findings:  Left main: 20% ostial stenosis.   Left Anterior Descending Artery: Large caliber vessel that does not reach the apex. The LAD gives off a large diagonal branch. In the mid LAD there is a 99% stenosis followed by a 90% stenosis. The distal vessel is moderate in caliber. The diagonal branch is a large caliber vessel with diffuse proximal 50% stenosis.   Circumflex Artery: Large caliber vessel with 40% proximal stenosis. There is a large caliber obtuse marginal branch with proximal 60-70% stenosis. The obtuse marginal branch bifurcates distally. The superior branch has proximal 80% stenosis.   Right Coronary Artery: Large dominant vessel with diffuse proximal and mid calcification. The mid vessel has serial 50% stenoses. The distal vessel has a 99% stenosis just before the bifurcation into the large caliber posterolateral branch and the large caliber PDA. The PDA has proximal 50% stenosis. The Posterolateral branch has mild diffuse plaque.   Left Ventricular Angiogram: LVEF=50%  Impression: 1. Severe triple vessel CAD 2. Low normal systolic function 3. Unstable angina  Recommendations: He has severe three vessel CAD. He has myelodysplastic syndrome and is not a good candidate for long term anti-platelet therapy. PCI would require multi-vessel stenting and need for long term anti-platelet therapy. Will consult CT surgery for CABG.        Complications:  None. The patient tolerated the procedure well.

## 2015-01-09 NOTE — H&P (View-Only) (Signed)
Cardiology Office Note   Date:  12/31/2014   ID:  Phillips, Goulette 03-18-1946, MRN [id number]  PCP:  Lilian Coma, MD  Cardiologist:  Dr. Casandra Doffing    Chief Complaint  Patient presents with  . Chest Pain  . Abnormal ECG  . Palpitations  . Hypertension     History of Present Illness: Eric [name] is a 69 y.o. male who presents for evaluation of the above.  He has a hx of PSVT, myelodysplastic syndrome, HTN, RA.  He is followed by Dr. Alvy Bimler at [facility].  He receives regular Aranesp injections.  He saw his PCP yesterday for chest pain and rapid palpitations and elevated BP after getting his most recent Aranesp injection.  ECG demonstrated new TWI and he was asked to FU here.    He has noted chest discomfort since the dose of his Aranesp increased several months ago.  He describes R sided chest heaviness/tightness with radiation to his R shoulder and up into his R neck.  His symptoms are much worse the first few days after his injection.  He notes symptoms with exertion.  They are improved with rest.  He is probably describing CCS class 2-3 symptoms.  He has chronic anemia and notes chronic dyspnea associated with this.  His dyspnea seems to be only somewhat worse.  He also notes rapid heartbeat for a few days after his injection.  He thinks this may be like his prior SVT.  He notes worse chest pain with this.  He denies assoc nausea, diaphoresis with his CP.  He denies syncope.  He denies orthopnea, PND, edema.     Studies/Reports Reviewed Today:  - Echocardiogram (11/15/12):  EF 60-65%, mild LAE, Ao sclerosis, mild TR, mildly elevated RVSP (45 mmHg), impaired relaxation.   Past Medical History  Diagnosis Date  . MDS (myelodysplastic syndrome), low grade 2012    on observation; MDS FISH panel on 04/26/2013 was normal; cytogenetics were also negative.   . Rheumatoid arthritis(714.0)     was on MTX until MDS dx  . Hypertension   . Anemia   . Family history of  ischemic heart disease   . Shortness of breath     exertional  . Dysrhythmia   . Tachycardia 2013    evaluated by Dr Irish Lack @ [facility]  . Gout   . Needs flu shot 08/23/2013  . Headache 03/07/2014    Past Surgical History  Procedure Laterality Date  . Knee arthroscopy Left   . Hernia repair    . Umbilical hernia repair    . Circumcision    . 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 acetaminophen (TYLENOL) 500 MG tablet Take 1,000 mg by mouth every 6 (six) hours as needed for pain.    Marland Kitchen alendronate (FOSAMAX) 70 MG tablet Take 70 mg by mouth once a week. Take with a full glass of water on an empty stomach.    Marland Kitchen allopurinol (ZYLOPRIM) 100 MG tablet Take 100 mg by mouth daily.    . butalbital-acetaminophen-caffeine (FIORICET) 50-325-40 MG per tablet Take 1 tablet by mouth every 6 (six) hours as needed for headache. 60 tablet 0  . Calcium-Vitamin D (CALTRATE 600  PLUS-VIT D PO) Take 1 tablet by mouth 2 (two) times daily.     . cyclobenzaprine (FLEXERIL) 10 MG tablet Take 10 mg by mouth as needed.    Marland Kitchen lisinopril-hydrochlorothiazide (PRINZIDE,ZESTORETIC) 20-25 MG per tablet Take 0.5 tablets by mouth daily.     Marland Kitchen loratadine (CLARITIN) 10 MG tablet Take 10 mg by mouth daily.    . metoprolol (LOPRESSOR) 50 MG tablet take 1/2 tablet by mouth twice a day 30 tablet 6  . Omega-3 Fatty Acids (FISH OIL) 1000 MG CAPS Take 2,000 mg by mouth 2 (two) times daily.    . predniSONE (DELTASONE) 5 MG tablet Take 9 mg by mouth daily.     . traMADol (ULTRAM) 50 MG tablet Take 50-100 mg by mouth every 4 (four) hours as needed for pain.     No current facility-administered medications for  this visit.    Allergies:   Review of patient's allergies indicates no known allergies.    Social History:  The patient  reports that he quit smoking about 44 years ago. His smoking use included Cigarettes. He has never used smokeless tobacco. He reports that he does not drink alcohol or use illicit drugs.   Family History:  The patient's family history includes Brain cancer in his mother; Breast cancer in his sister; Diabetes in his brother; Heart attack in his brother and father; Heart disease in his brother and father. There is no history of Stroke.    ROS:  Please see the history of present illness.   Otherwise, review of systems are positive for Headaches, fatigue, hearing loss, recent cough (treated with antibiotics), snoring, easy bruising.   All other systems are reviewed and negative.    PHYSICAL EXAM: VS:  BP 112/65 mmHg  Pulse 64  Ht 5\' 8"  (1.727 m)  Wt 199 lb (90.266 kg)  BMI 30.26 kg/m2    Wt Readings from Last 3 Encounters:  12/05/14 203 lb 12.8 oz (92.443 kg)  10/03/14 200 lb 14.4 oz (91.128 kg)  06/20/14 205 lb (92.987 kg)     GEN: Well nourished, well developed, in no acute distress HEENT: normal Neck: no JVD, no carotid bruits, no masses Cardiac:  Normal S1/S2, RRR; no murmur,  no rubs or gallops, no edema  Respiratory:  clear to auscultation bilaterally, no wheezing, rhonchi or rales. GI: soft, nontender, nondistended, + BS MS: no deformity or atrophy Skin: warm and dry  Neuro:  CNs II-XII intact, Strength and sensation are intact Psych: Normal affect   EKG:  EKG is ordered today.  It demonstrates:   NSR HR 64, normal axis, T-wave inversions in 2, 3, aVF, V5, V6; TWI new since last tracing.   Recent Labs: 05/30/2014: BUN 26.2*; Creatinine 0.8; Potassium 3.7; Sodium 138 12/26/2014: Hemoglobin 8.6*; Platelets 84 Large & giant platelets*    Recent Labs  11/14/14 0756 12/05/14 0757 12/26/14 0755  WBC 11.1* 10.5* 9.0  HGB 9.3* 9.0* 8.6*  PLT 68* 64*  84 Large & giant platelets*    Lipid Panel No results found for: CHOL, TRIG, HDL, CHOLHDL, VLDL, LDLCALC, LDLDIRECT    ASSESSMENT AND PLAN:  1.  Chest Pain:  His chest discomfort is concerning for CCS class 2-3 angina.  He has a lot of risk factors (HTN, prior smoking, FHx).  I spoke to his oncologist, Dr. Alvy Bimler by phone today.  His anemia has been getting slowly worse over time.  I suspect his angina is exacerbated by his anemia.  He has chronic thrombocytopenia,  but has generally stayed > 50K.  He would not meet criteria for transfusion with PRBCs unless his Hgb is < 8.  I have recommended proceeding with cardiac catheterization to further evaluate his symptoms.  Dr. Alvy Bimler felt that he is a reasonable candidate.  She noted that he could take dual antiplatelet therapy (should he require PCI) as long as his PLT count remains > 50K.  I have reviewed his case with Dr. Lauree Chandler (DOD) today as well who agreed with cardiac cath.    -  Continue ASA, beta blocker.      -  Add Imdur 15 mg QD (he does not take PDE-5 inhibitors) and NTG prn.    -  Arrange LHC in the next week with Dr. Casandra Doffing or Dr. Lauree Chandler.  Risks and benefits of cardiac catheterization have been discussed with the patient.  These include bleeding, infection, kidney damage, stroke, heart attack, death.  The patient understands these risks and is willing to proceed.     -  Pre-cath labs today:  BMET, CBC, INR. 2.  Hypertension:  BP is controlled.  He is tolerating his current therapy.   3.  Anemia:  Managed by oncology.  He receives Aranesp every 3 weeks.  4.  Paroxysmal SVT:  He has had some rapid palpitations.  If his cath demonstrates no obstructive CAD or if he continues to have palpitation after his cath, consider a 30 day event monitor.   5.  Snoring:  He may have symptoms of OSA.  Consider sleep study in the future.     Current medicines are reviewed at length with the patient today.  The patient  does not have concerns regarding medicines.  The following changes have been made:  As above.   Labs/ tests ordered today include:  Orders Placed This Encounter  Procedures  . INR/PT  . CBC w/Diff  . Basic Metabolic Panel (BMET)  . EKG 12-Lead    Disposition:   FU with Dr. Casandra Doffing after her cardiac catheterization.    Signed, Versie Starks, MHS 12/31/2014 11:34 AM    [facility] [place], [place], [place]  [address] Phone: [phone]; Fax: [phone]

## 2015-01-10 ENCOUNTER — Observation Stay (HOSPITAL_COMMUNITY): Payer: Medicare Other

## 2015-01-10 ENCOUNTER — Encounter (HOSPITAL_COMMUNITY): Payer: Self-pay | Admitting: Cardiology

## 2015-01-10 ENCOUNTER — Inpatient Hospital Stay (HOSPITAL_COMMUNITY): Payer: Medicare Other

## 2015-01-10 DIAGNOSIS — D630 Anemia in neoplastic disease: Secondary | ICD-10-CM | POA: Diagnosis present

## 2015-01-10 DIAGNOSIS — N990 Postprocedural (acute) (chronic) kidney failure: Secondary | ICD-10-CM | POA: Diagnosis not present

## 2015-01-10 DIAGNOSIS — I6523 Occlusion and stenosis of bilateral carotid arteries: Secondary | ICD-10-CM | POA: Diagnosis present

## 2015-01-10 DIAGNOSIS — I200 Unstable angina: Secondary | ICD-10-CM

## 2015-01-10 DIAGNOSIS — M069 Rheumatoid arthritis, unspecified: Secondary | ICD-10-CM | POA: Diagnosis present

## 2015-01-10 DIAGNOSIS — E785 Hyperlipidemia, unspecified: Secondary | ICD-10-CM

## 2015-01-10 DIAGNOSIS — D469 Myelodysplastic syndrome, unspecified: Secondary | ICD-10-CM | POA: Diagnosis present

## 2015-01-10 DIAGNOSIS — Z87891 Personal history of nicotine dependence: Secondary | ICD-10-CM | POA: Diagnosis not present

## 2015-01-10 DIAGNOSIS — D46Z Other myelodysplastic syndromes: Secondary | ICD-10-CM

## 2015-01-10 DIAGNOSIS — D62 Acute posthemorrhagic anemia: Secondary | ICD-10-CM | POA: Diagnosis not present

## 2015-01-10 DIAGNOSIS — I25110 Atherosclerotic heart disease of native coronary artery with unstable angina pectoris: Secondary | ICD-10-CM | POA: Diagnosis present

## 2015-01-10 DIAGNOSIS — Z8249 Family history of ischemic heart disease and other diseases of the circulatory system: Secondary | ICD-10-CM | POA: Diagnosis not present

## 2015-01-10 DIAGNOSIS — Z7982 Long term (current) use of aspirin: Secondary | ICD-10-CM | POA: Diagnosis not present

## 2015-01-10 DIAGNOSIS — R079 Chest pain, unspecified: Secondary | ICD-10-CM | POA: Diagnosis present

## 2015-01-10 DIAGNOSIS — N179 Acute kidney failure, unspecified: Secondary | ICD-10-CM | POA: Diagnosis not present

## 2015-01-10 DIAGNOSIS — M109 Gout, unspecified: Secondary | ICD-10-CM | POA: Diagnosis present

## 2015-01-10 DIAGNOSIS — Z833 Family history of diabetes mellitus: Secondary | ICD-10-CM | POA: Diagnosis not present

## 2015-01-10 DIAGNOSIS — D689 Coagulation defect, unspecified: Secondary | ICD-10-CM | POA: Diagnosis present

## 2015-01-10 DIAGNOSIS — D6959 Other secondary thrombocytopenia: Secondary | ICD-10-CM | POA: Diagnosis present

## 2015-01-10 DIAGNOSIS — Z7952 Long term (current) use of systemic steroids: Secondary | ICD-10-CM | POA: Diagnosis not present

## 2015-01-10 DIAGNOSIS — I10 Essential (primary) hypertension: Secondary | ICD-10-CM | POA: Diagnosis present

## 2015-01-10 DIAGNOSIS — Z96651 Presence of right artificial knee joint: Secondary | ICD-10-CM | POA: Diagnosis present

## 2015-01-10 DIAGNOSIS — Z79899 Other long term (current) drug therapy: Secondary | ICD-10-CM | POA: Diagnosis not present

## 2015-01-10 DIAGNOSIS — I471 Supraventricular tachycardia: Secondary | ICD-10-CM | POA: Diagnosis present

## 2015-01-10 DIAGNOSIS — I481 Persistent atrial fibrillation: Secondary | ICD-10-CM | POA: Diagnosis not present

## 2015-01-10 DIAGNOSIS — I700 Atherosclerosis of aorta: Secondary | ICD-10-CM | POA: Diagnosis present

## 2015-01-10 DIAGNOSIS — D696 Thrombocytopenia, unspecified: Secondary | ICD-10-CM | POA: Diagnosis present

## 2015-01-10 DIAGNOSIS — K913 Postprocedural intestinal obstruction: Secondary | ICD-10-CM | POA: Diagnosis not present

## 2015-01-10 DIAGNOSIS — Z01810 Encounter for preprocedural cardiovascular examination: Secondary | ICD-10-CM

## 2015-01-10 DIAGNOSIS — R072 Precordial pain: Secondary | ICD-10-CM

## 2015-01-10 DIAGNOSIS — J90 Pleural effusion, not elsewhere classified: Secondary | ICD-10-CM

## 2015-01-10 DIAGNOSIS — E8770 Fluid overload, unspecified: Secondary | ICD-10-CM | POA: Diagnosis not present

## 2015-01-10 LAB — PULMONARY FUNCTION TEST
DL/VA % pred: 140 %
DL/VA: 6.21 ml/min/mmHg/L
DLCO cor % pred: 110 %
DLCO cor: 31.25 ml/min/mmHg
DLCO unc % pred: 85 %
DLCO unc: 24.28 ml/min/mmHg
FEF 25-75 Post: 2.19 L/sec
FEF 25-75 Pre: 1.57 L/sec
FEF2575-%Change-Post: 40 %
FEF2575-%Pred-Post: 97 %
FEF2575-%Pred-Pre: 69 %
FEV1-%Change-Post: 8 %
FEV1-%Pred-Post: 76 %
FEV1-%Pred-Pre: 70 %
FEV1-Post: 2.24 L
FEV1-Pre: 2.07 L
FEV1FVC-%Change-Post: 7 %
FEV1FVC-%Pred-Pre: 102 %
FEV6-%Change-Post: 2 %
FEV6-%Pred-Post: 73 %
FEV6-%Pred-Pre: 72 %
FEV6-Post: 2.76 L
FEV6-Pre: 2.7 L
FEV6FVC-%Change-Post: 1 %
FEV6FVC-%Pred-Post: 106 %
FEV6FVC-%Pred-Pre: 105 %
FVC-%Change-Post: 0 %
FVC-%Pred-Post: 69 %
FVC-%Pred-Pre: 68 %
FVC-Post: 2.76 L
FVC-Pre: 2.74 L
Post FEV1/FVC ratio: 81 %
Post FEV6/FVC ratio: 100 %
Pre FEV1/FVC ratio: 76 %
Pre FEV6/FVC Ratio: 99 %
RV % pred: 90 %
RV: 2.04 L
TLC % pred: 76 %
TLC: 4.94 L

## 2015-01-10 LAB — LIPID PANEL
Cholesterol: 116 mg/dL (ref 0–200)
HDL: 30 mg/dL — ABNORMAL LOW (ref >39)
LDL Cholesterol: 62 mg/dL (ref 0–99)
Total CHOL/HDL Ratio: 3.9 RATIO
Triglycerides: 120 mg/dL (ref <150)
VLDL: 24 mg/dL (ref 0–40)

## 2015-01-10 LAB — CBC
HEMATOCRIT: 28 % — AB (ref 39.0–52.0)
Hemoglobin: 9 g/dL — ABNORMAL LOW (ref 13.0–17.0)
MCH: 24.7 pg — AB (ref 26.0–34.0)
MCHC: 32.1 g/dL (ref 30.0–36.0)
MCV: 76.7 fL — AB (ref 78.0–100.0)
PLATELETS: 43 10*3/uL — AB (ref 150–400)
RBC: 3.65 MIL/uL — AB (ref 4.22–5.81)
RDW: 27.9 % — ABNORMAL HIGH (ref 11.5–15.5)
WBC: 7 10*3/uL (ref 4.0–10.5)

## 2015-01-10 LAB — IRON AND TIBC
Iron: 68 ug/dL (ref 42–165)
SATURATION RATIOS: 34 % (ref 20–55)
TIBC: 199 ug/dL — ABNORMAL LOW (ref 215–435)
UIBC: 131 ug/dL (ref 125–400)

## 2015-01-10 LAB — BASIC METABOLIC PANEL
Anion gap: 5 (ref 5–15)
BUN: 16 mg/dL (ref 6–23)
CO2: 30 mmol/L (ref 19–32)
CREATININE: 0.79 mg/dL (ref 0.50–1.35)
Calcium: 9.3 mg/dL (ref 8.4–10.5)
Chloride: 102 mmol/L (ref 96–112)
GFR calc Af Amer: >90 mL/min (ref >90)
GFR calc non Af Amer: >90 mL/min (ref >90)
Glucose, Bld: 116 mg/dL — ABNORMAL HIGH (ref 70–99)
Potassium: 4.3 mmol/L (ref 3.5–5.1)
Sodium: 137 mmol/L (ref 135–145)

## 2015-01-10 LAB — RETICULOCYTES
RBC.: 3.65 MIL/uL — ABNORMAL LOW (ref 4.22–5.81)
Retic Count, Absolute: 43.8 10*3/uL (ref 19.0–186.0)
Retic Ct Pct: 1.2 % (ref 0.4–3.1)

## 2015-01-10 MED ORDER — METHYLPREDNISOLONE SODIUM SUCC 125 MG IJ SOLR
125.0000 mg | Freq: Every day | INTRAMUSCULAR | Status: AC
  Administered 2015-01-13: 125 mg via INTRAVENOUS
  Filled 2015-01-10: qty 2

## 2015-01-10 MED ORDER — ALPRAZOLAM 0.25 MG PO TABS
0.2500 mg | ORAL_TABLET | ORAL | Status: DC | PRN
  Administered 2015-01-11: 0.25 mg via ORAL
  Filled 2015-01-10: qty 1

## 2015-01-10 MED ORDER — ALBUTEROL SULFATE (2.5 MG/3ML) 0.083% IN NEBU
2.5000 mg | INHALATION_SOLUTION | Freq: Once | RESPIRATORY_TRACT | Status: AC
  Administered 2015-01-10: 2.5 mg via RESPIRATORY_TRACT

## 2015-01-10 NOTE — Progress Notes (Signed)
Pre-op Cardiac Surgery  Carotid Findings:  Right = 60-79% prox ICA stenosis. Left = 40-59% mid ICA stenosis. Antegrade vertebral flow.  Upper Extremity Right Left  Brachial Pressures 120 130  Radial Waveforms Tri Tri  Ulnar Waveforms Tri Tri  Palmar Arch (Allen's Test) Obliterates with radial compression, normal with ulnar compression Obliterates with radial compression, normal with ulnar compression    Findings:  Palpable pedal pulses.  Landry Mellow, RDMS, RVT 01/10/2015

## 2015-01-10 NOTE — Consult Note (Signed)
[address]       [place] [address]             [facility] Record #[id number] Date of Birth: 1946/02/23  Referring: No ref. provider found Primary Care: Lilian Coma, MD  Chief Complaint:   No chief complaint on file.  chest pain, unstable angina  History of Present Illness:      Patient examined, cardiac catheterization and 2-D echocardiogram personally reviewed. 69 year old Caucasian male nonsmoker presents to the hospital symptoms of unstable angina. These have been progressive over the past 12 weeks. He has a strong family history of CAD. His lipid panel has been normal. He has a history of remote smoking. He is a nondiabetic.  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 The patient has graftable vessels and echocardiogram shows no significant valvular disease. The patient is not felt to be candidate for PCI and surgical coronary revascularization has been recommended.  His past medical history significant for myelodysplasia and chronically low platelet count and anemia the past 4-5 years. He is followed by a hematologist. He receives Epogen injection every 3 weeks. He has not had any significant bleeding episodes for the past several weeks other than a nosebleed 1 week ago. He did not bleed much from his radial artery stick for his cardiac catheterization. The patient had a right total knee replacement 2 years ago without significant bleeding difficulties. His platelet count last week was 80,000. This week in the hospital was platelet count has varied between 35 and 45,000.  The patient has history of rheumatoid arthritis and takes prednisone 10 mg daily. Rheumatoid  arthritis is the reason why he had a total knee replacement  Current Activity/ Functional Status: Able to tolerate normal daily activities Chops wood and carries would bundles into the house to eat  home He is retired   Zubrod Score: At the time of surgery this patient's most appropriate activity status/level should be described as: []     0    Normal activity, no symptoms []     1    Restricted in physical strenuous activity but ambulatory, able to do out light work [x]     2    Ambulatory and capable of self care, unable to do work activities, up and about                 more than 50%  Of the time                            []     3    Only limited self care, in bed greater than 50% of waking hours []     4    Completely disabled, no self care, confined to bed or chair []     5    Moribund  Past Medical History  Diagnosis Date  . MDS (myelodysplastic syndrome), low grade 2012    on observation; MDS FISH panel on 04/26/2013 was normal; cytogenetics were also negative.   . Rheumatoid arthritis(714.0)     was on MTX until MDS dx  . Hypertension   . Anemia   . Family history of ischemic heart disease   . Shortness of breath     exertional  . Dysrhythmia   . Tachycardia 2013    evaluated by Dr Irish Lack @ [facility]  . Gout   .  Needs flu shot 08/23/2013  . Headache 03/07/2014  . CAD (coronary artery disease), native coronary artery 01/09/2015    3 v dz, EF 50%    Past Surgical History  Procedure Laterality Date  . Knee arthroscopy Left   . Hernia repair    . Umbilical hernia repair    . Circumcision    . Total knee arthroplasty Right 02/02/2013    Procedure: TOTAL KNEE ARTHROPLASTY;  Surgeon: Yvette Rack., MD;  Location: [facility];  Service: Orthopedics;  Laterality: Right;  RIGHT ARTHROPLASTY KNEE MEDIAL/LATERAL COMPARTMENTS WITH PATELLA RESURFACING   . Left heart catheterization with coronary angiogram N/A 01/09/2015    Procedure: LEFT HEART CATHETERIZATION WITH CORONARY ANGIOGRAM;  Surgeon: Burnell Blanks, MD;  LAD 99/90%, D1 50%, CFX 40%, OM 70%, OM branch 80%, RCA  50%, dRCA 99%, PDA 50%, EF 50%    History  Smoking status  . Former Smoker  . Types: Cigarettes  . Quit  date: 01/26/1970  Smokeless tobacco  . Never Used    History  Alcohol Use No    History   Social History  . Marital Status: Married    Spouse Name: N/A    Number of Children: N/A  . Years of Education: N/A   Occupational History  . Not on file.   Social History Main Topics  . Smoking status: Former Smoker    Types: Cigarettes    Quit date: 01/26/1970  . Smokeless tobacco: Never Used  . Alcohol Use: No  . Drug Use: No  . Sexual Activity: Not on file   Other Topics Concern  . Not on file   Social History Narrative    No Known Allergies  Current Facility-Administered Medications  Medication Dose Route Frequency Provider Last Rate Last Dose  . acetaminophen (TYLENOL) tablet 650 mg  650 mg Oral Q4H PRN Burnell Blanks, MD   650 mg at 01/10/15 1019  . allopurinol (ZYLOPRIM) tablet 100 mg  100 mg Oral Daily Burnell Blanks, MD   100 mg at 01/10/15 1033  . ALPRAZolam Duanne Moron) tablet 0.125 mg  0.125 mg Oral BID PRN Burnell Blanks, MD      . ALPRAZolam Duanne Moron) tablet 0.25-0.5 mg  0.25-0.5 mg Oral Q4H PRN Ivin Poot, MD      . aspirin chewable tablet 81 mg  81 mg Oral Daily Burnell Blanks, MD   81 mg at 01/10/15 1033  . atorvastatin (LIPITOR) tablet 80 mg  80 mg Oral q1800 Burnell Blanks, MD   80 mg at 01/09/15 1834  . isosorbide mononitrate (IMDUR) 24 hr tablet 15 mg  15 mg Oral Daily Burnell Blanks, MD   15 mg at 01/10/15 1033  . lisinopril (PRINIVIL,ZESTRIL) tablet 20 mg  20 mg Oral Daily Burnell Blanks, MD   20 mg at 01/10/15 1033  . loratadine (CLARITIN) tablet 10 mg  10 mg Oral Daily Burnell Blanks, MD   10 mg at 01/10/15 1033  . [START ON 01/13/2015] methylPREDNISolone sodium succinate (SOLU-MEDROL) 125 mg/2 mL injection 125 mg  125 mg Intravenous Daily Ivin Poot, MD      . metoprolol tartrate (LOPRESSOR) tablet 25 mg  25 mg Oral BID Burnell Blanks, MD   25 mg at 01/10/15 1033  . ondansetron  (ZOFRAN) injection 4 mg  4 mg Intravenous Q6H PRN Burnell Blanks, MD      . predniSONE (DELTASONE) tablet 10 mg  10 mg Oral Daily Harrell Gave  Santina Evans, MD   10 mg at 01/10/15 1051    Prescriptions prior to admission  Medication Sig Dispense Refill Last Dose  . acetaminophen (TYLENOL) 500 MG tablet Take 1,000 mg by mouth every 6 (six) hours as needed for pain.   01/08/2015 at 2100  . alendronate (FOSAMAX) 70 MG tablet Take 70 mg by mouth once a week. On Friday. Take with a full glass of water on an empty stomach.   Past Week at Unknown time  . allopurinol (ZYLOPRIM) 100 MG tablet Take 100 mg by mouth daily.   01/08/2015 at 0900  . ALPRAZolam (XANAX) 0.25 MG tablet Take 0.125 mg by mouth 2 (two) times daily as needed for anxiety.   0 01/08/2015 at 2100  . aspirin 81 MG tablet Take 81 mg by mouth daily.   01/08/2015 at 2100  . Calcium-Vitamin D (CALTRATE 600 PLUS-VIT D PO) Take 1 tablet by mouth 2 (two) times daily.    01/08/2015 at 2100  . Darbepoetin Alfa (ARANESP) 500 MCG/ML SOSY injection Inject 500 mcg into the skin every 21 ( twenty-one) days.   Past Week at Unknown time  . isosorbide mononitrate (IMDUR) 30 MG 24 hr tablet Take 0.5 tablets (15 mg total) by mouth daily. 30 tablet 11 01/08/2015 at 2100  . lisinopril-hydrochlorothiazide (PRINZIDE,ZESTORETIC) 20-25 MG per tablet Take 0.5 tablets by mouth daily.    01/08/2015 at 2100  . loratadine (CLARITIN) 10 MG tablet Take 10 mg by mouth daily.   01/08/2015 at 2100  . metoprolol (LOPRESSOR) 50 MG tablet take 1/2 tablet by mouth twice a day (Patient taking differently: Take 25 mg by mouth 2 (two) times daily. ) 30 tablet 6 01/08/2015 at 2100  . nitroGLYCERIN (NITROSTAT) 0.4 MG SL tablet Place 1 tablet (0.4 mg total) under the tongue every 5 (five) minutes as needed. 25 tablet 3 Past Week at Unknown time  . Omega-3 Fatty Acids (FISH OIL) 1000 MG CAPS Take 2,000 mg by mouth 2 (two) times daily.   01/08/2015 at 2100  . predniSONE (DELTASONE) 5 MG tablet Take  10 mg by mouth daily.    01/08/2015 at 2100  . traMADol (ULTRAM) 50 MG tablet Take 25 mg by mouth every 4 (four) hours as needed for pain.    01/08/2015 at 2100    Family History  Problem Relation Age of Onset  . Brain cancer Mother   . Heart disease Father     open heart surgery  . Breast cancer Sister   . Heart disease Brother     open heart surgery  . Diabetes Brother   . Heart attack Father   . Stroke Neg Hx   . Heart attack Brother      Review of Systems:     Cardiac Review of Systems: Y or N  Chest Pain [  x  ]  Resting SOB [ n  ] Exertional SOB  [ x ]  Orthopnea [  n]   Pedal Edema [  n ]    Palpitations [ n ] Syncope  [n  ]   Presyncope [n   ]  General Review of Systems: [Y] = yes [  ]=no Constitional: recent weight change [ n ]; anorexia [  ]; fatigue [  ]; nausea [  ]; night sweats [  ]; fever [  ]; or chills [  ]  Dental: poor dentition[  ]; Last Dentist visit: 6 months  Eye : blurred vision [  ]; diplopia [   ]; vision changes [  ];  Amaurosis fugax[  ]; Resp: cough [  ];  wheezing[  ];  hemoptysis[  ]; shortness of breath[  ]; paroxysmal nocturnal dyspnea[  ]; dyspnea on exertion[  ]; or orthopnea[  ];  GI:  gallstones[  ], vomiting[  ];  dysphagia[  ]; melena[  ];  hematochezia [  ]; heartburn[x  ];   Hx of  Colonoscopy[  ]; GU: kidney stones [  ]; hematuria[  ];   dysuria [  ];  nocturia[  ];  history of     obstruction [  ]; urinary frequency [  ]             Skin: rash, swelling[  ];, hair loss[  ];  peripheral edema[  ];  or itching[  ]; Musculosketetal: myalgias[  ];  joint swelling[  ];  joint erythema[  ];  joint pain[  ];  back pain[  ];  Heme/Lymph: bruising[ x ];  bleeding[  ];  anemia[ x ];  Neuro: TIA[  ];  headaches[  ];  stroke[  ];  vertigo[  ];  seizures[  ];   paresthesias[  ];  difficulty walking[  ];  Psych:depression[  ]; anxiety[  ];  Endocrine: diabetes[  ];  thyroid dysfunction[   ];  Immunizations: Flu [  ]; Pneumococcal[  ];  Other: Right-hand dominant                           Carotid Dopplers indicate moderate right carotid stenosis, mild left carotid stenosis, both asymptomatic  Physical Exam: BP 115/47 mmHg  Pulse 64  Temp(Src) 98.1 F (36.7 C) (Oral)  Resp 19  Ht 5\' 8"  (1.727 m)  Wt 194 lb 10.7 oz (88.3 kg)  BMI 29.61 kg/m2  SpO2 97%    General: Middle-aged male comfortable in no distress slightly obese  HEENT: Normocephalic pupils equal , dentition adequate Neck: Supple without JVD, adenopathy, or bruit Chest: Clear to auscultation, symmetrical breath sounds, no rhonchi, no tenderness             or deformity Cardiovascular: Regular rate and rhythm, no murmur, no gallop, peripheral pulses             palpable in all extremities Abdomen:  Soft, nontender, no palpable mass or organomegaly Extremities: Warm, well-perfused, no clubbing cyanosis edema or tenderness,              no venous stasis changes of the legs Rectal/GU: Deferred Neuro: Grossly non--focal and symmetrical throughout Skin: Clean and dry without rash or ulceration   Diagnostic Studies & Laboratory data:     Recent Radiology Findings:   Dg Chest 2 View  01/10/2015   CLINICAL DATA:  69 year old male with Anjeanette at rest and shortness of Breath. Initial encounter.  EXAM: CHEST  2 VIEW  COMPARISON:  06/28/2012.  FINDINGS: Stable mild cardiomegaly. Other mediastinal contours are within normal limits. Visualized tracheal air column is within normal limits. No pneumothorax, pulmonary edema, pleural effusion or confluent pulmonary opacity. No acute osseous abnormality identified. Calcified atherosclerosis of the aorta.  IMPRESSION: No acute cardiopulmonary abnormality.   Electronically Signed   By: Lars Pinks M.D.   On: 01/10/2015 07:21   Ct Chest Wo Contrast  01/10/2015   CLINICAL DATA:  Chest pain.  EXAM: CT CHEST WITHOUT CONTRAST  TECHNIQUE: Multidetector CT imaging of the chest was  performed following the standard protocol without IV contrast.  COMPARISON:  Chest radiograph of same day.  FINDINGS: No pneumothorax or pleural effusion is noted. No acute pulmonary disease is noted. Coronary artery calcifications are noted. No significant mediastinal mass or adenopathy is noted. No significant osseous abnormality is noted.  3.7 x 2.9 cm complex density is seen in the gallbladder fundus. It is uncertain if this represents large gallstone, or possibly mass.  IMPRESSION: Coronary artery calcifications are noted suggesting coronary artery disease.  No other significant abnormality is noted within the chest.  3.7 cm complex density is seen in the gallbladder fundus. It is uncertain if this represents large gallstone, or possibly mass. Ultrasound is recommended for further evaluation.   Electronically Signed   By: Sabino Dick M.D.   On: 01/10/2015 15:28     I have independently reviewed the above radiologic studies.  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 Details of the operation including benefits alternatives and risks have been discussed and reviewed with the patient and family and all questions addressed.     2 @ME1 @ 01/10/2015 4:32 PM

## 2015-01-10 NOTE — Progress Notes (Signed)
UR completed 

## 2015-01-10 NOTE — Progress Notes (Signed)
Patient Name: Eric [name] Date of Encounter: 01/10/2015  Principal Problem:   Unstable angina Active Problems:   MDS (myelodysplastic syndrome), low grade   Rheumatoid arthritis   Anemia in neoplastic disease   Thrombocytopenia   HTN (hypertension)   Dyslipidemia, low HDL   Primary Cardiologist: Dr. Irish Lack  Patient Profile: 69 yo male w/ hx PSVT, myelodysplastic syndrome, HTN, RA. Had palps, abnl T waves, referred for cath. Cath 02/04 w/ 3 v dz, TCTS to see.  SUBJECTIVE: Had some upper left chest pain after getting PFTs, it is worse with certain movements and positions.  OBJECTIVE Filed Vitals:   01/09/15 2153 01/10/15 0516 01/10/15 1024 01/10/15 1326  BP: 131/66 141/62 134/74 115/47  Pulse: 72 57 85 64  Temp:  97.5 F (36.4 C) 97.6 F (36.4 C) 98.1 F (36.7 C)  TempSrc:  Oral Oral Oral  Resp:  18  19  Height:      Weight:  194 lb 10.7 oz (88.3 kg)    SpO2:  99%  97%    Intake/Output Summary (Last 24 hours) at 01/10/15 1350 Last data filed at 01/10/15 1326  Gross per 24 hour  Intake   1316 ml  Output      0 ml  Net   1316 ml   Filed Weights   01/09/15 0659 01/10/15 0516  Weight: 190 lb (86.183 kg) 194 lb 10.7 oz (88.3 kg)    PHYSICAL EXAM General: Well developed, well nourished, male in no acute distress. Head: Normocephalic, atraumatic.  Neck: Supple without bruits, JVD not elevated Lungs:  Resp regular and unlabored, few rales bases. Heart: RRR, S1, S2, no S3, S4, soft murmur; no rub. Abdomen: Soft, non-tender, non-distended, BS + x 4.  Extremities: No clubbing, cyanosis, no edema. Right radial cath site without ecchymosis or hematoma Neuro: Alert and oriented X 3. Moves all extremities spontaneously. Psych: Normal affect.  LABS: BMET, CBC hemoglobin A1c and iron profile are pending CBC:  CBC Latest Ref Rng 01/10/2015 01/09/2015 12/31/2014  WBC 4.0 - 10.5 K/uL 7.0 5.2 15.8(H)  Hemoglobin 13.0 - 17.0 g/dL 9.0(L) 8.6(L) 9.7(L)  Hematocrit 39.0  - 52.0 % 28.0(L) 26.3(L) 30.2(L)  Platelets 150 - 400 K/uL 43(L) 31(L) 82.0(L)   INR:No results for input(s): INR in the last 72 hours. Basic Metabolic Panel:  BMP Latest Ref Rng 01/10/2015 12/31/2014 05/30/2014  Glucose 70 - 99 mg/dL 116(H) 96 147(H)  BUN 6 - 23 mg/dL 16 18 26.2(H)  Creatinine 0.50 - 1.35 mg/dL 0.79 0.78 0.8  Sodium 135 - 145 mmol/L 137 137 138  Potassium 3.5 - 5.1 mmol/L 4.3 4.1 3.7  Chloride 96 - 112 mmol/L 102 101 -  CO2 19 - 32 mmol/L 30 28 24   Calcium 8.4 - 10.5 mg/dL 9.3 9.7 9.8    Liver Function Tests:No results for input(s): AST, ALT, ALKPHOS, BILITOT, PROT, ALBUMIN in the last 72 hours. Cardiac Enzymes:No results for input(s): CKTOTAL, CKMB, CKMBINDEX, TROPONINI in the last 72 hours. No results for input(s): TROPIPOC in the last 72 hours. BNP:No results found for: PROBNP D-dimer:No results for input(s): DDIMER in the last 72 hours. Hemoglobin A1C:No results for input(s): HGBA1C in the last 72 hours. Fasting Lipid Panel:  Recent Labs  01/10/15 0530  CHOL 116  HDL 30*  LDLCALC 62  TRIG 120  CHOLHDL 3.9    TELE:   Sinus rhythm, sinus bradycardia     Cardiac cath: 01/09/2015 Angiographic Findings: Left main: 20% ostial stenosis.  Left  Anterior Descending Artery: Large caliber vessel that does not reach the apex. The LAD gives off a large diagonal branch. In the mid LAD there is a 99% stenosis followed by a 90% stenosis. The distal vessel is moderate in caliber. The diagonal branch is a large caliber vessel with diffuse proximal 50% stenosis.  Circumflex Artery: Large caliber vessel with 40% proximal stenosis. There is a large caliber obtuse marginal branch with proximal 60-70% stenosis. The obtuse marginal branch bifurcates distally. The superior branch has proximal 80% stenosis.  Right Coronary Artery: Large dominant vessel with diffuse proximal and mid calcification. The mid vessel has serial 50% stenoses. The distal vessel has a 99% stenosis just  before the bifurcation into the large caliber posterolateral branch and the large caliber PDA. The PDA has proximal 50% stenosis. The Posterolateral branch has mild diffuse plaque.  Left Ventricular Angiogram: LVEF=50% Impression: 1. Severe triple vessel CAD 2. Low normal systolic function 3. Unstable angina Recommendations: He has severe three vessel CAD. He has myelodysplastic syndrome and is not a good candidate for long term anti-platelet therapy. PCI would require multi-vessel stenting and need for long term anti-platelet therapy. Will consult CT surgery for CABG.   Radiology/Studies: Dg Chest 2 View 01/10/2015   CLINICAL DATA:  69 year old male with Anjeanette at rest and shortness of Breath. Initial encounter.  EXAM: CHEST  2 VIEW  COMPARISON:  06/28/2012.  FINDINGS: Stable mild cardiomegaly. Other mediastinal contours are within normal limits. Visualized tracheal air column is within normal limits. No pneumothorax, pulmonary edema, pleural effusion or confluent pulmonary opacity. No acute osseous abnormality identified. Calcified atherosclerosis of the aorta.  IMPRESSION: No acute cardiopulmonary abnormality.   Electronically Signed   By: Lars Pinks M.D.   On: 01/10/2015 07:21     Current Medications:  . allopurinol  100 mg Oral Daily  . aspirin  81 mg Oral Daily  . atorvastatin  80 mg Oral q1800  . isosorbide mononitrate  15 mg Oral Daily  . lisinopril  20 mg Oral Daily  . loratadine  10 mg Oral Daily  . metoprolol tartrate  25 mg Oral BID  . predniSONE  10 mg Oral Daily      ASSESSMENT AND PLAN: Principal Problem:   Unstable angina -- On aspirin, statin, ACE inhibitor, beta blocker and nitrates. Currently pain-free. Okay to work with cardiac rehabilitation as long as he is not having pain.  Active Problems:   MDS (myelodysplastic syndrome), low grade -- Dr. Simeon Craft such approved DAPT as long as platelets greater than 50,000. -- Platelets on 02/04 were 31,000. Previously 82,000.  Recheck today and follow    Rheumatoid arthritis -- Patient is on home dose of prednisone 10 mg daily -- M.D. advise on stress dose steroids    Anemia in neoplastic disease -- Was iron deficient a year ago, but June 2015 iron levels were normal. Recheck today.    Thrombocytopenia -- Chronic problem, see above    HTN (hypertension) -- SBP 110s-140s on current therapy. Heart rate 50s at times -- On home doses of beta blocker, HCTZ held and ACE inhibitor increased on admission.    Dyslipidemia, low HDL -- On statin    Anticoagulation -- Because of thrombocytopenia, he is not on Lovenox or heparin. He has SCDs ordered. -- No bleeding issues, continue aspirin 81 mg daily  Plan: Continue to monitor, TCTS evaluation pending. With 99% mid LAD lesion, doubt that he could go home and come back for CABG, will likely need to  stay in hospital until surgery.  Signed, Drucilla Schmidt , PA-C 1:50 PM 01/10/2015   Patient seen and examined. Agree with assessment and plan. No recurrent chest pain. Patient being evaluated by Dr. Nils Pyle.  Myelodysplastic syndrome;  platelet count is 43K today, slightly improved. H/H stable at 9/28.    Troy Sine, MD, [facility] 01/10/2015 1:50 PM

## 2015-01-10 NOTE — Progress Notes (Signed)
CARDIAC REHAB PHASE I   PRE:  Rate/Rhythm: 76 SR  BP:  Supine:   Sitting: 120/64  Standing:    SaO2: 98%RA  MODE:  Ambulation: 500 ft   POST:  Rate/Rhythm: 68SR  BP:  Supine:   Sitting: 130/70  Standing:    SaO2: 97%RA 1105-1200 Pt walked 500 ft with steady gait. No CP. Tolerated well. Discussed sternal precautions and pt able to stand without use of arms. Stated he can get 2500 ml on IS. Gave [facility] booklet and care guide sheet. Wrote down how to view pre op video for pt and family to view. Briefly discussed CRP 2 and pt showed interest to attend after surgery.   Graylon Good, RN BSN  01/10/2015 11:55 AM

## 2015-01-10 NOTE — Progress Notes (Signed)
Pt. Ambulated in halls x2 this shift. Assist x1 @his  side.

## 2015-01-11 DIAGNOSIS — D630 Anemia in neoplastic disease: Secondary | ICD-10-CM

## 2015-01-11 DIAGNOSIS — I10 Essential (primary) hypertension: Secondary | ICD-10-CM

## 2015-01-11 DIAGNOSIS — D696 Thrombocytopenia, unspecified: Secondary | ICD-10-CM

## 2015-01-11 LAB — CBC
HEMATOCRIT: 26.1 % — AB (ref 39.0–52.0)
Hemoglobin: 8.2 g/dL — ABNORMAL LOW (ref 13.0–17.0)
MCH: 24.6 pg — AB (ref 26.0–34.0)
MCHC: 31.4 g/dL (ref 30.0–36.0)
MCV: 78.1 fL (ref 78.0–100.0)
PLATELETS: 47 10*3/uL — AB (ref 150–400)
RBC: 3.34 MIL/uL — ABNORMAL LOW (ref 4.22–5.81)
RDW: 27.6 % — ABNORMAL HIGH (ref 11.5–15.5)
WBC: 6.2 10*3/uL (ref 4.0–10.5)

## 2015-01-11 LAB — BASIC METABOLIC PANEL
Anion gap: 5 (ref 5–15)
BUN: 17 mg/dL (ref 6–23)
CO2: 28 mmol/L (ref 19–32)
CREATININE: 0.73 mg/dL (ref 0.50–1.35)
Calcium: 8.7 mg/dL (ref 8.4–10.5)
Chloride: 106 mmol/L (ref 96–112)
GFR calc non Af Amer: >90 mL/min (ref >90)
GLUCOSE: 97 mg/dL (ref 70–99)
POTASSIUM: 4.3 mmol/L (ref 3.5–5.1)
Sodium: 139 mmol/L (ref 135–145)

## 2015-01-11 LAB — HEMOGLOBIN A1C
Hgb A1c MFr Bld: 5.5 % (ref 4.8–5.6)
Mean Plasma Glucose: 111 mg/dL

## 2015-01-11 LAB — VITAMIN B12: VITAMIN B 12: 560 pg/mL (ref 211–911)

## 2015-01-11 LAB — FOLATE: Folate: >20 ng/mL

## 2015-01-11 LAB — FERRITIN: Ferritin: 405 ng/mL — ABNORMAL HIGH (ref 22–322)

## 2015-01-11 NOTE — Progress Notes (Signed)
SUBJECTIVE:  Denies any chest pain  OBJECTIVE:   Vitals:   Filed Vitals:   01/10/15 1024 01/10/15 1326 01/10/15 2028 01/11/15 0530  BP: 134/74 115/47 116/56 107/40  Pulse: 85 64 68 54  Temp: 97.6 F (36.4 C) 98.1 F (36.7 C) 97.7 F (36.5 C) 98 F (36.7 C)  TempSrc: Oral Oral Oral Oral  Resp:  19 18 16   Height:      Weight:      SpO2:  97% 98% 100%   I&O's:   Intake/Output Summary (Last 24 hours) at 01/11/15 1146 Last data filed at 01/11/15 0836  Gross per 24 hour  Intake    720 ml  Output      0 ml  Net    720 ml   TELEMETRY: Reviewed telemetry pt in NSR:     PHYSICAL EXAM General: Well developed, well nourished, in no acute distress Head: Eyes PERRLA, No xanthomas.   Normal cephalic and atramatic  Lungs:   Clear bilaterally to auscultation and percussion. Heart:   HRRR S1 S2 Pulses are 2+ & equal. Abdomen: Bowel sounds are positive, abdomen soft and non-tender without masses Extremities:   No clubbing, cyanosis or edema.  DP +1 Neuro: Alert and oriented X 3. Psych:  Good affect, responds appropriately   LABS: Basic Metabolic Panel:  Recent Labs  01/10/15 1209 01/11/15 0350  NA 137 139  K 4.3 4.3  CL 102 106  CO2 30 28  GLUCOSE 116* 97  BUN 16 17  CREATININE 0.79 0.73  CALCIUM 9.3 8.7   Liver Function Tests: No results for input(s): AST, ALT, ALKPHOS, BILITOT, PROT, ALBUMIN in the last 72 hours. No results for input(s): LIPASE, AMYLASE in the last 72 hours. CBC:  Recent Labs  01/10/15 1209 01/11/15 0350  WBC 7.0 6.2  HGB 9.0* 8.2*  HCT 28.0* 26.1*  MCV 76.7* 78.1  PLT 43* 47*   Cardiac Enzymes: No results for input(s): CKTOTAL, CKMB, CKMBINDEX, TROPONINI in the last 72 hours. BNP: Invalid input(s): POCBNP D-Dimer: No results for input(s): DDIMER in the last 72 hours. Hemoglobin A1C:  Recent Labs  01/10/15 0530  HGBA1C 5.5   Fasting Lipid Panel:  Recent Labs  01/10/15 0530  CHOL 116  HDL 30*  LDLCALC 62  TRIG 120    CHOLHDL 3.9   Thyroid Function Tests: No results for input(s): TSH, T4TOTAL, T3FREE, THYROIDAB in the last 72 hours.  Invalid input(s): FREET3 Anemia Panel:  Recent Labs  01/10/15 1209  VITAMINB12 560  FOLATE >20.0  FERRITIN 405*  TIBC 199*  IRON 68  RETICCTPCT 1.2   Coag Panel:   Lab Results  Component Value Date   INR 1.1* 12/31/2014   INR 1.15 01/26/2013    RADIOLOGY: Dg Chest 2 View  01/10/2015   CLINICAL DATA:  69 year old male with Anjeanette at rest and shortness of Breath. Initial encounter.  EXAM: CHEST  2 VIEW  COMPARISON:  06/28/2012.  FINDINGS: Stable mild cardiomegaly. Other mediastinal contours are within normal limits. Visualized tracheal air column is within normal limits. No pneumothorax, pulmonary edema, pleural effusion or confluent pulmonary opacity. No acute osseous abnormality identified. Calcified atherosclerosis of the aorta.  IMPRESSION: No acute cardiopulmonary abnormality.   Electronically Signed   By: Lars Pinks M.D.   On: 01/10/2015 07:21   Ct Chest Wo Contrast  01/10/2015   CLINICAL DATA:  Chest pain.  EXAM: CT CHEST WITHOUT CONTRAST  TECHNIQUE: Multidetector CT imaging of the chest was performed following  the standard protocol without IV contrast.  COMPARISON:  Chest radiograph of same day.  FINDINGS: No pneumothorax or pleural effusion is noted. No acute pulmonary disease is noted. Coronary artery calcifications are noted. No significant mediastinal mass or adenopathy is noted. No significant osseous abnormality is noted.  3.7 x 2.9 cm complex density is seen in the gallbladder fundus. It is uncertain if this represents large gallstone, or possibly mass.  IMPRESSION: Coronary artery calcifications are noted suggesting coronary artery disease.  No other significant abnormality is noted within the chest.  3.7 cm complex density is seen in the gallbladder fundus. It is uncertain if this represents large gallstone, or possibly mass. Ultrasound is recommended  for further evaluation.   Electronically Signed   By: Sabino Dick M.D.   On: 01/10/2015 15:28   ASSESSMENT AND PLAN: Principal Problem:  Unstable angina with 99% mid LAD for CABG next week -- On aspirin, statin, ACE inhibitor, beta blocker and nitrates. Currently pain-free. Okay to work with cardiac rehabilitation as long as he is not having pain.  Active Problems:  MDS (myelodysplastic syndrome), low grade -- Dr. Alvy Bimler approved DAPT as long as platelets greater than 50,000. -- Platelets on 02/04 were 31,000. Previously 82,000. Today they are 47K   Rheumatoid arthritis -- Patient is on home dose of prednisone 10 mg daily -- Will need stress dose steroids if surgery done   Anemia in neoplastic disease -- Was iron deficient a year ago, but June 2015 iron levels were normal. Ferritin elevated so most likely anemia of chronic disease.     Thrombocytopenia -- Chronic problem, see above   HTN (hypertension) -- SBP 110s-140s on current therapy. Heart rate 50s at times -- On home doses of beta blocker, HCTZ held and ACE inhibitor increased on admission.   Dyslipidemia, low HDL -- On statin   Anticoagulation -- Because of thrombocytopenia, he is not on Lovenox or heparin. He has SCDs ordered. -- No bleeding issues, continue aspirin 81 mg daily     Sueanne Margarita, MD  01/11/2015  11:46 AM

## 2015-01-11 NOTE — Progress Notes (Signed)
PATIENT HAS RESTED WELL THIS SHIFT. BATHED SELF BEFORE BED. TYLENOL REQUESTED TO AID WITH COMFORT. SPOUSE AT BEDSIDE.  PATIENT INSTRUCTED TO CALL FOR ASSISTANCE WHEN NEEDED.

## 2015-01-11 NOTE — Progress Notes (Signed)
CARDIAC REHAB PHASE I   PRE:  Rate/Rhythm: 94 sinus  BP:  Supine:   Sitting: 114/62  Standing:    SaO2: 98 RA  MODE:  Ambulation: 550 ft   POST:  Rate/Rhythem: 73  BP:  Supine:   Sitting: 120/60  Standing:    SaO2: 100 RA  Pt ambulated 550 ft with assist x1 and  tolerated walk well without complaint. Pt Pt has been walking with wife in hall as well.  Encouraged to continue to walk over weekend and to use ISP.  We will f/u after surgery next week. Alberteen Sam, MA, ACSM RCEP [phone] Clotilde Dieter

## 2015-01-12 LAB — BASIC METABOLIC PANEL
ANION GAP: 5 (ref 5–15)
Anion gap: 6 (ref 5–15)
BUN: 21 mg/dL (ref 6–23)
BUN: 20 mg/dL (ref 6–23)
CO2: 27 mmol/L (ref 19–32)
CO2: 27 mmol/L (ref 19–32)
CREATININE: 0.75 mg/dL (ref 0.50–1.35)
Calcium: 8.8 mg/dL (ref 8.4–10.5)
Calcium: 8.8 mg/dL (ref 8.4–10.5)
Chloride: 106 mmol/L (ref 96–112)
Chloride: 106 mmol/L (ref 96–112)
Creatinine, Ser: 0.82 mg/dL (ref 0.50–1.35)
GFR calc Af Amer: >90 mL/min (ref >90)
GFR calc Af Amer: >90 mL/min (ref >90)
GFR calc non Af Amer: >90 mL/min (ref >90)
GFR calc non Af Amer: 89 mL/min — ABNORMAL LOW (ref >90)
Glucose, Bld: 97 mg/dL (ref 70–99)
Glucose, Bld: 161 mg/dL — ABNORMAL HIGH (ref 70–99)
Potassium: 4.4 mmol/L (ref 3.5–5.1)
Potassium: 4.1 mmol/L (ref 3.5–5.1)
SODIUM: 138 mmol/L (ref 135–145)
Sodium: 139 mmol/L (ref 135–145)

## 2015-01-12 LAB — CBC
HCT: 28 % — ABNORMAL LOW (ref 39.0–52.0)
HEMATOCRIT: 25 % — AB (ref 39.0–52.0)
HEMOGLOBIN: 7.9 g/dL — AB (ref 13.0–17.0)
Hemoglobin: 9.1 g/dL — ABNORMAL LOW (ref 13.0–17.0)
MCH: 24.1 pg — AB (ref 26.0–34.0)
MCH: 24.8 pg — ABNORMAL LOW (ref 26.0–34.0)
MCHC: 31.6 g/dL (ref 30.0–36.0)
MCHC: 32.5 g/dL (ref 30.0–36.0)
MCV: 76.2 fL — AB (ref 78.0–100.0)
MCV: 76.3 fL — ABNORMAL LOW (ref 78.0–100.0)
Platelets: 68 10*3/uL — ABNORMAL LOW (ref 150–400)
Platelets: 62 10*3/uL — ABNORMAL LOW (ref 150–400)
RBC: 3.28 MIL/uL — AB (ref 4.22–5.81)
RBC: 3.67 MIL/uL — ABNORMAL LOW (ref 4.22–5.81)
RDW: 27.4 % — ABNORMAL HIGH (ref 11.5–15.5)
RDW: 27.8 % — ABNORMAL HIGH (ref 11.5–15.5)
WBC: 5.8 10*3/uL (ref 4.0–10.5)
WBC: 7.5 10*3/uL (ref 4.0–10.5)

## 2015-01-12 LAB — PREPARE RBC (CROSSMATCH)

## 2015-01-12 MED ORDER — EPINEPHRINE HCL 1 MG/ML IJ SOLN
0.0000 ug/min | INTRAVENOUS | Status: DC
  Filled 2015-01-12: qty 4

## 2015-01-12 MED ORDER — METOPROLOL TARTRATE 12.5 MG HALF TABLET
12.5000 mg | ORAL_TABLET | Freq: Once | ORAL | Status: AC
  Administered 2015-01-13: 12.5 mg via ORAL
  Filled 2015-01-12: qty 1

## 2015-01-12 MED ORDER — DEXMEDETOMIDINE HCL IN NACL 400 MCG/100ML IV SOLN
0.1000 ug/kg/h | INTRAVENOUS | Status: AC
  Administered 2015-01-13: 0.2 ug/kg/h via INTRAVENOUS
  Filled 2015-01-12: qty 100

## 2015-01-12 MED ORDER — POTASSIUM CHLORIDE 2 MEQ/ML IV SOLN
80.0000 meq | INTRAVENOUS | Status: DC
Start: 2015-01-13 — End: 2015-01-13
  Filled 2015-01-12: qty 40

## 2015-01-12 MED ORDER — DOPAMINE-DEXTROSE 3.2-5 MG/ML-% IV SOLN
0.0000 ug/kg/min | INTRAVENOUS | Status: AC
  Administered 2015-01-13: 2 ug/kg/min via INTRAVENOUS
  Filled 2015-01-12: qty 250

## 2015-01-12 MED ORDER — DEXTROSE 5 % IV SOLN
750.0000 mg | INTRAVENOUS | Status: DC
  Filled 2015-01-12: qty 750

## 2015-01-12 MED ORDER — MAGNESIUM SULFATE 50 % IJ SOLN
40.0000 meq | INTRAMUSCULAR | Status: DC
  Filled 2015-01-12: qty 10

## 2015-01-12 MED ORDER — SODIUM CHLORIDE 0.9 % IV SOLN
INTRAVENOUS | Status: AC
  Administered 2015-01-13: 69.8 mL/h via INTRAVENOUS
  Filled 2015-01-12 (×2): qty 40

## 2015-01-12 MED ORDER — CHLORHEXIDINE GLUCONATE 4 % EX LIQD
60.0000 mL | Freq: Once | CUTANEOUS | Status: DC
  Filled 2015-01-12 (×2): qty 60

## 2015-01-12 MED ORDER — SODIUM CHLORIDE 0.9 % IV SOLN
INTRAVENOUS | Status: DC
  Filled 2015-01-12 (×2): qty 30

## 2015-01-12 MED ORDER — PHENYLEPHRINE HCL 10 MG/ML IJ SOLN
30.0000 ug/min | INTRAVENOUS | Status: AC
  Administered 2015-01-13: 25 ug/min via INTRAVENOUS
  Filled 2015-01-12 (×2): qty 2

## 2015-01-12 MED ORDER — DEXTROSE 5 % IV SOLN
1.5000 g | INTRAVENOUS | Status: AC
  Administered 2015-01-13: .75 g via INTRAVENOUS
  Administered 2015-01-13: 1.5 g via INTRAVENOUS
  Filled 2015-01-12: qty 1.5

## 2015-01-12 MED ORDER — TEMAZEPAM 7.5 MG PO CAPS: 15.0000 mg | ORAL_CAPSULE | Freq: Once | ORAL | Status: AC | PRN

## 2015-01-12 MED ORDER — VANCOMYCIN HCL 10 G IV SOLR
1500.0000 mg | INTRAVENOUS | Status: AC
  Administered 2015-01-13: 1500 mg via INTRAVENOUS
  Filled 2015-01-12: qty 1500

## 2015-01-12 MED ORDER — NITROGLYCERIN IN D5W 200-5 MCG/ML-% IV SOLN
2.0000 ug/min | INTRAVENOUS | Status: AC
  Administered 2015-01-13: 5 ug/min via INTRAVENOUS
  Filled 2015-01-12: qty 250

## 2015-01-12 MED ORDER — CHLORHEXIDINE GLUCONATE 4 % EX LIQD
60.0000 mL | Freq: Once | CUTANEOUS | Status: AC
  Administered 2015-01-13: 4 via TOPICAL
  Filled 2015-01-12: qty 60

## 2015-01-12 MED ORDER — BISACODYL 5 MG PO TBEC
5.0000 mg | DELAYED_RELEASE_TABLET | Freq: Once | ORAL | Status: AC
  Administered 2015-01-12: 5 mg via ORAL
  Filled 2015-01-12: qty 1

## 2015-01-12 MED ORDER — INSULIN REGULAR HUMAN 100 UNIT/ML IJ SOLN
INTRAMUSCULAR | Status: AC
  Administered 2015-01-13: 2.6 [IU]/h via INTRAVENOUS
  Filled 2015-01-12 (×2): qty 2.5

## 2015-01-12 MED ORDER — PLASMA-LYTE 148 IV SOLN
INTRAVENOUS | Status: AC
  Administered 2015-01-13: 500 mL
  Filled 2015-01-12 (×2): qty 2.5

## 2015-01-12 NOTE — Progress Notes (Addendum)
SUBJECTIVE:  Denies any chest pain.  Had some mild SOB last night   OBJECTIVE:   Vitals:   Filed Vitals:   01/11/15 0530 01/11/15 1325 01/11/15 1950 01/12/15 0445  BP: 107/40 107/50 125/68 130/55  Pulse: 54 56 107 57  Temp: 98 F (36.7 C) 98.2 F (36.8 C) 98.6 F (37 C) 98 F (36.7 C)  TempSrc: Oral Oral Oral Oral  Resp: 16 18 20 16   Height:      Weight:      SpO2: 100% 99% 98% 100%   I&O's:   Intake/Output Summary (Last 24 hours) at 01/12/15 9604 Last data filed at 01/11/15 1247  Gross per 24 hour  Intake    480 ml  Output      0 ml  Net    480 ml   TELEMETRY: Reviewed telemetry pt in NSR     PHYSICAL EXAM General: Well developed, well nourished, in no acute distress Head: Eyes PERRLA, No xanthomas.   Normal cephalic and atramatic  Lungs:   Clear bilaterally to auscultation and percussion. Heart:   HRRR S1 S2 Pulses are 2+ & equal. Abdomen: Bowel sounds are positive, abdomen soft and non-tender without masses  Extremities:   No clubbing, cyanosis or edema.  DP +1 Neuro: Alert and oriented X 3. Psych:  Good affect, responds appropriately   LABS: Basic Metabolic Panel:  Recent Labs  01/11/15 0350 01/12/15 0553  NA 139 138  K 4.3 4.4  CL 106 106  CO2 28 27  GLUCOSE 97 97  BUN 17 21  CREATININE 0.73 0.75  CALCIUM 8.7 8.8   Liver Function Tests: No results for input(s): AST, ALT, ALKPHOS, BILITOT, PROT, ALBUMIN in the last 72 hours. No results for input(s): LIPASE, AMYLASE in the last 72 hours. CBC:  Recent Labs  01/11/15 0350 01/12/15 0553  WBC 6.2 5.8  HGB 8.2* 7.9*  HCT 26.1* 25.0*  MCV 78.1 76.2*  PLT 47* 68*   Cardiac Enzymes: No results for input(s): CKTOTAL, CKMB, CKMBINDEX, TROPONINI in the last 72 hours. BNP: Invalid input(s): POCBNP D-Dimer: No results for input(s): DDIMER in the last 72 hours. Hemoglobin A1C:  Recent Labs  01/10/15 0530  HGBA1C 5.5   Fasting Lipid Panel:  Recent Labs  01/10/15 0530  CHOL 116  HDL  30*  LDLCALC 62  TRIG 120  CHOLHDL 3.9   Thyroid Function Tests: No results for input(s): TSH, T4TOTAL, T3FREE, THYROIDAB in the last 72 hours.  Invalid input(s): FREET3 Anemia Panel:  Recent Labs  01/10/15 1209  VITAMINB12 560  FOLATE >20.0  FERRITIN 405*  TIBC 199*  IRON 68  RETICCTPCT 1.2   Coag Panel:   Lab Results  Component Value Date   INR 1.1* 12/31/2014   INR 1.15 01/26/2013    RADIOLOGY: Dg Chest 2 View  01/10/2015   CLINICAL DATA:  69 year old male with Anjeanette at rest and shortness of Breath. Initial encounter.  EXAM: CHEST  2 VIEW  COMPARISON:  06/28/2012.  FINDINGS: Stable mild cardiomegaly. Other mediastinal contours are within normal limits. Visualized tracheal air column is within normal limits. No pneumothorax, pulmonary edema, pleural effusion or confluent pulmonary opacity. No acute osseous abnormality identified. Calcified atherosclerosis of the aorta.  IMPRESSION: No acute cardiopulmonary abnormality.   Electronically Signed   By: Lars Pinks M.D.   On: 01/10/2015 07:21   Ct Chest Wo Contrast  01/10/2015   CLINICAL DATA:  Chest pain.  EXAM: CT CHEST WITHOUT CONTRAST  TECHNIQUE: Multidetector  CT imaging of the chest was performed following the standard protocol without IV contrast.  COMPARISON:  Chest radiograph of same day.  FINDINGS: No pneumothorax or pleural effusion is noted. No acute pulmonary disease is noted. Coronary artery calcifications are noted. No significant mediastinal mass or adenopathy is noted. No significant osseous abnormality is noted.  3.7 x 2.9 cm complex density is seen in the gallbladder fundus. It is uncertain if this represents large gallstone, or possibly mass.  IMPRESSION: Coronary artery calcifications are noted suggesting coronary artery disease.  No other significant abnormality is noted within the chest.  3.7 cm complex density is seen in the gallbladder fundus. It is uncertain if this represents large gallstone, or possibly mass.  Ultrasound is recommended for further evaluation.   Electronically Signed   By: Sabino Dick M.D.   On: 01/10/2015 15:28   ASSESSMENT AND PLAN: Principal Problem:  Unstable angina with 99% mid LAD for CABG this week -- On aspirin, statin, ACE inhibitor, beta blocker and nitrates. Currently pain-free. Okay to work with cardiac rehabilitation as long as he is not having pain.  Active Problems:  MDS (myelodysplastic syndrome), low grade -- Dr. Alvy Bimler approved DAPT as long as platelets greater than 50,000. -- Platelets on 02/04 were 31,000. Previously 82,000. Today they are 68K   Rheumatoid arthritis -- Patient is on home dose of prednisone 10 mg daily -- Will need stress dose steroids for surgery - per CVTS   Anemia in neoplastic disease -- Was iron deficient a year ago, but June 2015 iron levels were normal. Ferritin elevated so most likely anemia of chronic disease.    Thrombocytopenia -- Chronic problem, see above   HTN (hypertension) -- SBP 110s-140s on current therapy. Heart rate 50s at times -- On home doses of beta blocker, HCTZ held and ACE inhibitor increased on admission.   Dyslipidemia, low HDL -- On statin   Anticoagulation -- Because of thrombocytopenia, he is not on Lovenox or heparin. He has SCDs ordered. -- No bleeding issues, continue aspirin 81 mg daily       Sueanne Margarita, MD  01/12/2015  8:14 AM

## 2015-01-13 ENCOUNTER — Inpatient Hospital Stay (HOSPITAL_COMMUNITY): Payer: Medicare Other | Admitting: Anesthesiology

## 2015-01-13 ENCOUNTER — Encounter (HOSPITAL_COMMUNITY): Payer: Self-pay | Admitting: Critical Care Medicine

## 2015-01-13 ENCOUNTER — Inpatient Hospital Stay (HOSPITAL_COMMUNITY): Payer: Medicare Other

## 2015-01-13 ENCOUNTER — Encounter (HOSPITAL_COMMUNITY)
Admission: RE | Disposition: A | Discharge status: Home / Self Care | Payer: Medicare Other | Source: Ambulatory Visit | Attending: Cardiothoracic Surgery

## 2015-01-13 DIAGNOSIS — Z951 Presence of aortocoronary bypass graft: Secondary | ICD-10-CM

## 2015-01-13 DIAGNOSIS — I25110 Atherosclerotic heart disease of native coronary artery with unstable angina pectoris: Secondary | ICD-10-CM

## 2015-01-13 HISTORY — PX: TEE WITHOUT CARDIOVERSION: SHX5443

## 2015-01-13 HISTORY — PX: CORONARY ARTERY BYPASS GRAFT: SHX141

## 2015-01-13 LAB — POCT I-STAT, CHEM 8
BUN: 24 mg/dL — ABNORMAL HIGH (ref 6–23)
BUN: 23 mg/dL (ref 6–23)
BUN: 26 mg/dL — ABNORMAL HIGH (ref 6–23)
BUN: 23 mg/dL (ref 6–23)
BUN: 22 mg/dL (ref 6–23)
BUN: 23 mg/dL (ref 6–23)
CALCIUM ION: 1.28 mmol/L (ref 1.13–1.30)
CALCIUM ION: 1.07 mmol/L — AB (ref 1.13–1.30)
CHLORIDE: 105 mmol/L (ref 96–112)
CREATININE: 0.6 mg/dL (ref 0.50–1.35)
CREATININE: 0.8 mg/dL (ref 0.50–1.35)
CREATININE: 0.7 mg/dL (ref 0.50–1.35)
Calcium, Ion: 1.08 mmol/L — ABNORMAL LOW (ref 1.13–1.30)
Calcium, Ion: 1.22 mmol/L (ref 1.13–1.30)
Calcium, Ion: 1.37 mmol/L — ABNORMAL HIGH (ref 1.13–1.30)
Calcium, Ion: 1.22 mmol/L (ref 1.13–1.30)
Chloride: 103 mmol/L (ref 96–112)
Chloride: 102 mmol/L (ref 96–112)
Chloride: 105 mmol/L (ref 96–112)
Chloride: 101 mmol/L (ref 96–112)
Chloride: 104 mmol/L (ref 96–112)
Creatinine, Ser: 0.8 mg/dL (ref 0.50–1.35)
Creatinine, Ser: 0.6 mg/dL (ref 0.50–1.35)
Creatinine, Ser: 0.8 mg/dL (ref 0.50–1.35)
GLUCOSE: 199 mg/dL — AB (ref 70–99)
GLUCOSE: 184 mg/dL — AB (ref 70–99)
GLUCOSE: 111 mg/dL — AB (ref 70–99)
Glucose, Bld: 145 mg/dL — ABNORMAL HIGH (ref 70–99)
Glucose, Bld: 176 mg/dL — ABNORMAL HIGH (ref 70–99)
Glucose, Bld: 224 mg/dL — ABNORMAL HIGH (ref 70–99)
HCT: 25 % — ABNORMAL LOW (ref 39.0–52.0)
HCT: 26 % — ABNORMAL LOW (ref 39.0–52.0)
HCT: 25 % — ABNORMAL LOW (ref 39.0–52.0)
HCT: 27 % — ABNORMAL LOW (ref 39.0–52.0)
HEMATOCRIT: 24 % — AB (ref 39.0–52.0)
HEMATOCRIT: 26 % — AB (ref 39.0–52.0)
HEMOGLOBIN: 8.2 g/dL — AB (ref 13.0–17.0)
HEMOGLOBIN: 8.5 g/dL — AB (ref 13.0–17.0)
HEMOGLOBIN: 8.8 g/dL — AB (ref 13.0–17.0)
Hemoglobin: 8.5 g/dL — ABNORMAL LOW (ref 13.0–17.0)
Hemoglobin: 8.8 g/dL — ABNORMAL LOW (ref 13.0–17.0)
Hemoglobin: 9.2 g/dL — ABNORMAL LOW (ref 13.0–17.0)
POTASSIUM: 4.3 mmol/L (ref 3.5–5.1)
POTASSIUM: 4.5 mmol/L (ref 3.5–5.1)
POTASSIUM: 4.6 mmol/L (ref 3.5–5.1)
POTASSIUM: 4.6 mmol/L (ref 3.5–5.1)
Potassium: 4.2 mmol/L (ref 3.5–5.1)
Potassium: 4.4 mmol/L (ref 3.5–5.1)
SODIUM: 138 mmol/L (ref 135–145)
SODIUM: 141 mmol/L (ref 135–145)
Sodium: 141 mmol/L (ref 135–145)
Sodium: 138 mmol/L (ref 135–145)
Sodium: 139 mmol/L (ref 135–145)
Sodium: 140 mmol/L (ref 135–145)
TCO2: 22 mmol/L (ref 0–100)
TCO2: 19 mmol/L (ref 0–100)
TCO2: 21 mmol/L (ref 0–100)
TCO2: 19 mmol/L (ref 0–100)
TCO2: 18 mmol/L (ref 0–100)
TCO2: 23 mmol/L (ref 0–100)

## 2015-01-13 LAB — PROTIME-INR
INR: 1.48 (ref 0.00–1.49)
Prothrombin Time: 18.1 seconds — ABNORMAL HIGH (ref 11.6–15.2)

## 2015-01-13 LAB — CBC
HCT: 25.9 % — ABNORMAL LOW (ref 39.0–52.0)
HEMATOCRIT: 23 % — AB (ref 39.0–52.0)
Hemoglobin: 7.6 g/dL — ABNORMAL LOW (ref 13.0–17.0)
Hemoglobin: 9 g/dL — ABNORMAL LOW (ref 13.0–17.0)
MCH: 26.1 pg (ref 26.0–34.0)
MCH: 27.4 pg (ref 26.0–34.0)
MCHC: 33 g/dL (ref 30.0–36.0)
MCHC: 34.7 g/dL (ref 30.0–36.0)
MCV: 79 fL (ref 78.0–100.0)
MCV: 78.7 fL (ref 78.0–100.0)
Platelets: 72 10*3/uL — ABNORMAL LOW (ref 150–400)
Platelets: ADEQUATE 10*3/uL (ref 150–400)
RBC: 2.91 MIL/uL — ABNORMAL LOW (ref 4.22–5.81)
RBC: 3.29 MIL/uL — ABNORMAL LOW (ref 4.22–5.81)
RDW: 24.3 % — ABNORMAL HIGH (ref 11.5–15.5)
RDW: 22.8 % — ABNORMAL HIGH (ref 11.5–15.5)
WBC: 23.9 10*3/uL — AB (ref 4.0–10.5)
WBC: 19.1 10*3/uL — ABNORMAL HIGH (ref 4.0–10.5)

## 2015-01-13 LAB — POCT I-STAT 4, (NA,K, GLUC, HGB,HCT)
Glucose, Bld: 132 mg/dL — ABNORMAL HIGH (ref 70–99)
HEMATOCRIT: 25 % — AB (ref 39.0–52.0)
HEMOGLOBIN: 8.5 g/dL — AB (ref 13.0–17.0)
Potassium: 3.9 mmol/L (ref 3.5–5.1)
SODIUM: 142 mmol/L (ref 135–145)

## 2015-01-13 LAB — APTT: aPTT: 35 seconds (ref 24–37)

## 2015-01-13 LAB — CREATININE, SERUM
Creatinine, Ser: 0.83 mg/dL (ref 0.50–1.35)
GFR calc Af Amer: >90 mL/min (ref >90)
GFR calc non Af Amer: 88 mL/min — ABNORMAL LOW (ref >90)

## 2015-01-13 LAB — POCT I-STAT 3, ART BLOOD GAS (G3+)
ACID-BASE DEFICIT: 1 mmol/L (ref 0.0–2.0)
Acid-base deficit: 2 mmol/L (ref 0.0–2.0)
Acid-base deficit: 5 mmol/L — ABNORMAL HIGH (ref 0.0–2.0)
BICARBONATE: 24 meq/L (ref 20.0–24.0)
Bicarbonate: 20.9 mEq/L (ref 20.0–24.0)
Bicarbonate: 25.2 mEq/L — ABNORMAL HIGH (ref 20.0–24.0)
Bicarbonate: 23.4 mEq/L (ref 20.0–24.0)
Bicarbonate: 24.5 mEq/L — ABNORMAL HIGH (ref 20.0–24.0)
O2 SAT: 99 %
O2 SAT: 94 %
O2 SAT: 98 %
O2 Saturation: 100 %
O2 Saturation: 98 %
PCO2 ART: 47.9 mmHg — AB (ref 35.0–45.0)
PH ART: 7.335 — AB (ref 7.350–7.450)
PH ART: 7.4 (ref 7.350–7.450)
PO2 ART: 72 mmHg — AB (ref 80.0–100.0)
PO2 ART: 102 mmHg — AB (ref 80.0–100.0)
Patient temperature: 36.8
TCO2: 25 mmol/L (ref 0–100)
TCO2: 22 mmol/L (ref 0–100)
TCO2: 27 mmol/L (ref 0–100)
TCO2: 25 mmol/L (ref 0–100)
TCO2: 26 mmol/L (ref 0–100)
pCO2 arterial: 39.2 mmHg (ref 35.0–45.0)
pCO2 arterial: 43.1 mmHg (ref 35.0–45.0)
pCO2 arterial: 37.9 mmHg (ref 35.0–45.0)
pCO2 arterial: 39.3 mmHg (ref 35.0–45.0)
pH, Arterial: 7.309 — ABNORMAL LOW (ref 7.350–7.450)
pH, Arterial: 7.374 (ref 7.350–7.450)
pH, Arterial: 7.404 (ref 7.350–7.450)
pO2, Arterial: 357 mmHg — ABNORMAL HIGH (ref 80.0–100.0)
pO2, Arterial: 126 mmHg — ABNORMAL HIGH (ref 80.0–100.0)
pO2, Arterial: 99 mmHg (ref 80.0–100.0)

## 2015-01-13 LAB — HEMOGLOBIN AND HEMATOCRIT, BLOOD
HCT: 24.1 % — ABNORMAL LOW (ref 39.0–52.0)
Hemoglobin: 8.1 g/dL — ABNORMAL LOW (ref 13.0–17.0)

## 2015-01-13 LAB — PLATELET COUNT: Platelets: 60 10*3/uL — ABNORMAL LOW (ref 150–400)

## 2015-01-13 LAB — FIBRINOGEN: Fibrinogen: 188 mg/dL — ABNORMAL LOW (ref 204–475)

## 2015-01-13 LAB — MAGNESIUM: Magnesium: 2.8 mg/dL — ABNORMAL HIGH (ref 1.5–2.5)

## 2015-01-13 LAB — HEMOGLOBIN A1C
Hgb A1c MFr Bld: 5.5 % (ref 4.8–5.6)
Mean Plasma Glucose: 111 mg/dL

## 2015-01-13 LAB — PREPARE RBC (CROSSMATCH)

## 2015-01-13 SURGERY — CORONARY ARTERY BYPASS GRAFTING (CABG)
Anesthesia: General | Site: Chest

## 2015-01-13 MED ORDER — MIDAZOLAM HCL 10 MG/2ML IJ SOLN
INTRAMUSCULAR | Status: AC
  Filled 2015-01-13: qty 2

## 2015-01-13 MED ORDER — SODIUM CHLORIDE 0.9 % IJ SOLN
OROMUCOSAL | Status: DC | PRN
  Administered 2015-01-13: 4 mL via TOPICAL

## 2015-01-13 MED ORDER — TRAMADOL HCL 50 MG PO TABS
50.0000 mg | ORAL_TABLET | ORAL | Status: DC | PRN
  Administered 2015-01-13 – 2015-01-15 (×6): 100 mg via ORAL
  Filled 2015-01-13 (×5): qty 2

## 2015-01-13 MED ORDER — SODIUM CHLORIDE 0.9 % IJ SOLN
INTRAMUSCULAR | Status: AC
  Filled 2015-01-13: qty 20

## 2015-01-13 MED ORDER — HEPARIN SODIUM (PORCINE) 1000 UNIT/ML IJ SOLN
INTRAMUSCULAR | Status: DC | PRN
  Administered 2015-01-13: 2000 [IU] via INTRAVENOUS
  Administered 2015-01-13: 28000 [IU] via INTRAVENOUS

## 2015-01-13 MED ORDER — FENTANYL CITRATE 0.05 MG/ML IJ SOLN
INTRAMUSCULAR | Status: AC
  Filled 2015-01-13: qty 5

## 2015-01-13 MED ORDER — DOCUSATE SODIUM 100 MG PO CAPS
200.0000 mg | ORAL_CAPSULE | Freq: Every day | ORAL | Status: DC
  Administered 2015-01-14 – 2015-01-17 (×4): 200 mg via ORAL
  Filled 2015-01-13 (×5): qty 2

## 2015-01-13 MED ORDER — ARTIFICIAL TEARS OP OINT
TOPICAL_OINTMENT | OPHTHALMIC | Status: AC
  Filled 2015-01-13: qty 3.5

## 2015-01-13 MED ORDER — SODIUM CHLORIDE 0.9 % IJ SOLN
3.0000 mL | INTRAMUSCULAR | Status: DC | PRN
  Administered 2015-01-15 – 2015-01-18 (×6): 3 mL via INTRAVENOUS
  Filled 2015-01-13 (×6): qty 3

## 2015-01-13 MED ORDER — LIDOCAINE HCL (CARDIAC) 20 MG/ML IV SOLN
INTRAVENOUS | Status: AC
  Filled 2015-01-13: qty 5

## 2015-01-13 MED ORDER — ROCURONIUM BROMIDE 100 MG/10ML IV SOLN
INTRAVENOUS | Status: DC | PRN
  Administered 2015-01-13: 50 mg via INTRAVENOUS
  Administered 2015-01-13: 20 mg via INTRAVENOUS
  Administered 2015-01-13 (×2): 50 mg via INTRAVENOUS
  Administered 2015-01-13 (×2): 20 mg via INTRAVENOUS

## 2015-01-13 MED ORDER — FENTANYL CITRATE 0.05 MG/ML IJ SOLN
INTRAMUSCULAR | Status: DC | PRN
  Administered 2015-01-13: 50 ug via INTRAVENOUS
  Administered 2015-01-13: 500 ug via INTRAVENOUS
  Administered 2015-01-13: 25 ug via INTRAVENOUS
  Administered 2015-01-13: 175 ug via INTRAVENOUS
  Administered 2015-01-13: 25 ug via INTRAVENOUS
  Administered 2015-01-13: 50 ug via INTRAVENOUS
  Administered 2015-01-13: 250 ug via INTRAVENOUS
  Administered 2015-01-13: 25 ug via INTRAVENOUS
  Administered 2015-01-13 (×2): 100 ug via INTRAVENOUS

## 2015-01-13 MED ORDER — BISACODYL 10 MG RE SUPP
10.0000 mg | Freq: Every day | RECTAL | Status: DC
  Administered 2015-01-15: 10 mg via RECTAL

## 2015-01-13 MED ORDER — SODIUM CHLORIDE 0.9 % IJ SOLN
INTRAMUSCULAR | Status: AC
  Filled 2015-01-13: qty 10

## 2015-01-13 MED ORDER — OXYCODONE HCL 5 MG PO TABS
5.0000 mg | ORAL_TABLET | ORAL | Status: DC | PRN
  Administered 2015-01-13 (×2): 10 mg via ORAL
  Administered 2015-01-14: 5 mg via ORAL
  Administered 2015-01-15: 10 mg via ORAL
  Administered 2015-01-16: 5 mg via ORAL
  Administered 2015-01-17 (×2): 10 mg via ORAL
  Filled 2015-01-13: qty 2
  Filled 2015-01-13: qty 1
  Filled 2015-01-13 (×2): qty 2
  Filled 2015-01-13: qty 1
  Filled 2015-01-13 (×2): qty 2

## 2015-01-13 MED ORDER — ALBUMIN HUMAN 5 % IV SOLN
INTRAVENOUS | Status: DC | PRN
  Administered 2015-01-13: 13:00:00 via INTRAVENOUS

## 2015-01-13 MED ORDER — ROCURONIUM BROMIDE 50 MG/5ML IV SOLN
INTRAVENOUS | Status: AC
  Filled 2015-01-13: qty 2

## 2015-01-13 MED ORDER — SODIUM CHLORIDE 0.9 % IV SOLN: Freq: Once | INTRAVENOUS | Status: DC

## 2015-01-13 MED ORDER — PANTOPRAZOLE SODIUM 40 MG PO TBEC
40.0000 mg | DELAYED_RELEASE_TABLET | Freq: Every day | ORAL | Status: DC
  Administered 2015-01-15: 40 mg via ORAL
  Filled 2015-01-13: qty 1

## 2015-01-13 MED ORDER — INSULIN REGULAR HUMAN 100 UNIT/ML IJ SOLN
INTRAMUSCULAR | Status: DC
  Administered 2015-01-13: 2.1 [IU]/h via INTRAVENOUS
  Filled 2015-01-13 (×2): qty 2.5

## 2015-01-13 MED ORDER — BISACODYL 5 MG PO TBEC
10.0000 mg | DELAYED_RELEASE_TABLET | Freq: Every day | ORAL | Status: DC
  Administered 2015-01-14 – 2015-01-17 (×3): 10 mg via ORAL
  Filled 2015-01-13 (×3): qty 2

## 2015-01-13 MED ORDER — ASPIRIN EC 325 MG PO TBEC
325.0000 mg | DELAYED_RELEASE_TABLET | Freq: Every day | ORAL | Status: DC
  Administered 2015-01-14 – 2015-01-15 (×2): 325 mg via ORAL
  Filled 2015-01-13 (×2): qty 1

## 2015-01-13 MED ORDER — PHENYLEPHRINE 40 MCG/ML (10ML) SYRINGE FOR IV PUSH (FOR BLOOD PRESSURE SUPPORT)
PREFILLED_SYRINGE | INTRAVENOUS | Status: AC
  Filled 2015-01-13: qty 10

## 2015-01-13 MED ORDER — SODIUM CHLORIDE 0.9 % IV SOLN
INTRAVENOUS | Status: AC
  Administered 2015-01-13: 14 mL/h via INTRAVENOUS
  Filled 2015-01-13: qty 20

## 2015-01-13 MED ORDER — DEXMEDETOMIDINE HCL IN NACL 200 MCG/50ML IV SOLN: 0.0000 ug/kg/h | INTRAVENOUS | Status: DC

## 2015-01-13 MED ORDER — ACETAMINOPHEN 500 MG PO TABS
1000.0000 mg | ORAL_TABLET | Freq: Four times a day (QID) | ORAL | Status: DC
Start: 2015-01-14 — End: 2015-01-18
  Administered 2015-01-13 – 2015-01-18 (×17): 1000 mg via ORAL
  Filled 2015-01-13 (×20): qty 2

## 2015-01-13 MED ORDER — SODIUM CHLORIDE 0.9 % IJ SOLN
3.0000 mL | Freq: Two times a day (BID) | INTRAMUSCULAR | Status: DC
  Administered 2015-01-14 – 2015-01-18 (×8): 3 mL via INTRAVENOUS

## 2015-01-13 MED ORDER — EPHEDRINE SULFATE 50 MG/ML IJ SOLN
INTRAMUSCULAR | Status: AC
  Filled 2015-01-13: qty 1

## 2015-01-13 MED ORDER — PROPOFOL 10 MG/ML IV BOLUS
INTRAVENOUS | Status: DC | PRN
  Administered 2015-01-13: 20 mg via INTRAVENOUS

## 2015-01-13 MED ORDER — PROTAMINE SULFATE 10 MG/ML IV SOLN
INTRAVENOUS | Status: AC
  Filled 2015-01-13: qty 5

## 2015-01-13 MED ORDER — ONDANSETRON HCL 4 MG/2ML IJ SOLN
4.0000 mg | Freq: Four times a day (QID) | INTRAMUSCULAR | Status: DC | PRN
  Administered 2015-01-14 – 2015-01-17 (×5): 4 mg via INTRAVENOUS
  Filled 2015-01-13 (×5): qty 2

## 2015-01-13 MED ORDER — INSULIN REGULAR BOLUS VIA INFUSION
0.0000 [IU] | Freq: Three times a day (TID) | INTRAVENOUS | Status: DC
  Filled 2015-01-13: qty 10

## 2015-01-13 MED ORDER — POTASSIUM CHLORIDE 10 MEQ/50ML IV SOLN: 10.0000 meq | INTRAVENOUS | Status: AC

## 2015-01-13 MED ORDER — ACETAMINOPHEN 160 MG/5ML PO SOLN
1000.0000 mg | Freq: Four times a day (QID) | ORAL | Status: DC
  Filled 2015-01-13: qty 40.6

## 2015-01-13 MED ORDER — MIDAZOLAM HCL 2 MG/2ML IJ SOLN
INTRAMUSCULAR | Status: AC
  Filled 2015-01-13: qty 2

## 2015-01-13 MED ORDER — LACTATED RINGERS IV SOLN
INTRAVENOUS | Status: DC | PRN
  Administered 2015-01-13: 08:00:00 via INTRAVENOUS

## 2015-01-13 MED ORDER — ROCURONIUM BROMIDE 50 MG/5ML IV SOLN
INTRAVENOUS | Status: AC
  Filled 2015-01-13: qty 1

## 2015-01-13 MED ORDER — PROTAMINE SULFATE 10 MG/ML IV SOLN
INTRAVENOUS | Status: DC | PRN
  Administered 2015-01-13: 280 mg via INTRAVENOUS

## 2015-01-13 MED ORDER — SODIUM CHLORIDE 0.45 % IV SOLN
INTRAVENOUS | Status: DC
  Administered 2015-01-13: 15:00:00 via INTRAVENOUS

## 2015-01-13 MED ORDER — MIDAZOLAM HCL 5 MG/5ML IJ SOLN
INTRAMUSCULAR | Status: DC | PRN
  Administered 2015-01-13: 3 mg via INTRAVENOUS
  Administered 2015-01-13: 1 mg via INTRAVENOUS
  Administered 2015-01-13 (×2): 2 mg via INTRAVENOUS
  Administered 2015-01-13 (×2): 1 mg via INTRAVENOUS
  Administered 2015-01-13: 2 mg via INTRAVENOUS
  Administered 2015-01-13 (×2): 1 mg via INTRAVENOUS

## 2015-01-13 MED ORDER — ALBUMIN HUMAN 5 % IV SOLN: 250.0000 mL | INTRAVENOUS | Status: AC | PRN

## 2015-01-13 MED ORDER — MAGNESIUM SULFATE 4 GM/100ML IV SOLN
4.0000 g | Freq: Once | INTRAVENOUS | Status: AC
  Administered 2015-01-13: 4 g via INTRAVENOUS
  Filled 2015-01-13: qty 100

## 2015-01-13 MED ORDER — NITROGLYCERIN IN D5W 200-5 MCG/ML-% IV SOLN: 0.0000 ug/min | INTRAVENOUS | Status: DC

## 2015-01-13 MED ORDER — ARTIFICIAL TEARS OP OINT
TOPICAL_OINTMENT | OPHTHALMIC | Status: DC | PRN
  Administered 2015-01-13: 1 via OPHTHALMIC

## 2015-01-13 MED ORDER — HEMOSTATIC AGENTS (NO CHARGE) OPTIME
TOPICAL | Status: DC | PRN
  Administered 2015-01-13 (×2): 1 via TOPICAL

## 2015-01-13 MED ORDER — ASPIRIN 81 MG PO CHEW: 324.0000 mg | CHEWABLE_TABLET | Freq: Every day | ORAL | Status: DC

## 2015-01-13 MED ORDER — ACETAMINOPHEN 160 MG/5ML PO SOLN: 650.0000 mg | Freq: Once | ORAL | Status: AC

## 2015-01-13 MED ORDER — METOPROLOL TARTRATE 1 MG/ML IV SOLN
2.5000 mg | INTRAVENOUS | Status: DC | PRN
  Administered 2015-01-15 – 2015-01-16 (×2): 2.5 mg via INTRAVENOUS
  Filled 2015-01-13 (×2): qty 5

## 2015-01-13 MED ORDER — SODIUM CHLORIDE 0.9 % IV SOLN
Freq: Once | INTRAVENOUS | Status: DC
Start: 2015-01-13 — End: 2015-01-13
  Administered 2015-01-13: 14:00:00 via INTRAVENOUS

## 2015-01-13 MED ORDER — MORPHINE SULFATE 2 MG/ML IJ SOLN
2.0000 mg | INTRAMUSCULAR | Status: DC | PRN
  Administered 2015-01-13 (×2): 4 mg via INTRAVENOUS
  Administered 2015-01-13 – 2015-01-14 (×2): 2 mg via INTRAVENOUS
  Administered 2015-01-14 (×3): 4 mg via INTRAVENOUS
  Administered 2015-01-15: 2 mg via INTRAVENOUS
  Filled 2015-01-13 (×4): qty 2
  Filled 2015-01-13 (×3): qty 1
  Filled 2015-01-13: qty 2

## 2015-01-13 MED ORDER — SUCCINYLCHOLINE CHLORIDE 20 MG/ML IJ SOLN
INTRAMUSCULAR | Status: DC | PRN
  Administered 2015-01-13: 160 mg via INTRAVENOUS

## 2015-01-13 MED ORDER — SODIUM CHLORIDE 0.9 % IV SOLN: 250.0000 mL | INTRAVENOUS | Status: DC

## 2015-01-13 MED ORDER — ACETAMINOPHEN 650 MG RE SUPP
650.0000 mg | Freq: Once | RECTAL | Status: AC
  Administered 2015-01-13: 650 mg via RECTAL

## 2015-01-13 MED ORDER — DEXTROSE 5 % IV SOLN
1.5000 g | Freq: Two times a day (BID) | INTRAVENOUS | Status: AC
  Administered 2015-01-13 – 2015-01-15 (×4): 1.5 g via INTRAVENOUS
  Filled 2015-01-13 (×4): qty 1.5

## 2015-01-13 MED ORDER — PHENYLEPHRINE HCL 10 MG/ML IJ SOLN
INTRAMUSCULAR | Status: DC | PRN
  Administered 2015-01-13 (×3): 80 ug via INTRAVENOUS

## 2015-01-13 MED ORDER — CETYLPYRIDINIUM CHLORIDE 0.05 % MT LIQD
7.0000 mL | Freq: Two times a day (BID) | OROMUCOSAL | Status: DC
  Administered 2015-01-13 – 2015-01-23 (×18): 7 mL via OROMUCOSAL

## 2015-01-13 MED ORDER — MORPHINE SULFATE 2 MG/ML IJ SOLN
1.0000 mg | INTRAMUSCULAR | Status: AC | PRN
  Administered 2015-01-13: 4 mg via INTRAVENOUS
  Administered 2015-01-13: 2 mg via INTRAVENOUS
  Filled 2015-01-13 (×2): qty 2
  Filled 2015-01-13: qty 1

## 2015-01-13 MED ORDER — LACTATED RINGERS IV SOLN
INTRAVENOUS | Status: DC
  Administered 2015-01-13 – 2015-01-15 (×2): via INTRAVENOUS

## 2015-01-13 MED ORDER — FAMOTIDINE IN NACL 20-0.9 MG/50ML-% IV SOLN
20.0000 mg | Freq: Two times a day (BID) | INTRAVENOUS | Status: AC
  Administered 2015-01-13: 20 mg via INTRAVENOUS

## 2015-01-13 MED ORDER — DEXMEDETOMIDINE HCL IN NACL 400 MCG/100ML IV SOLN
0.1000 ug/kg/h | INTRAVENOUS | Status: DC
  Filled 2015-01-13: qty 100

## 2015-01-13 MED ORDER — MIDAZOLAM HCL 2 MG/2ML IJ SOLN: 2.0000 mg | INTRAMUSCULAR | Status: DC | PRN

## 2015-01-13 MED ORDER — METOPROLOL TARTRATE 25 MG/10 ML ORAL SUSPENSION
12.5000 mg | Freq: Two times a day (BID) | ORAL | Status: DC
  Filled 2015-01-13 (×3): qty 5

## 2015-01-13 MED ORDER — VANCOMYCIN HCL IN DEXTROSE 1-5 GM/200ML-% IV SOLN
1000.0000 mg | Freq: Once | INTRAVENOUS | Status: AC
  Administered 2015-01-13: 1000 mg via INTRAVENOUS
  Filled 2015-01-13: qty 200

## 2015-01-13 MED ORDER — LACTATED RINGERS IV SOLN: 500.0000 mL | Freq: Once | INTRAVENOUS | Status: AC | PRN

## 2015-01-13 MED ORDER — 0.9 % SODIUM CHLORIDE (POUR BTL) OPTIME
TOPICAL | Status: DC | PRN
  Administered 2015-01-13: 6000 mL

## 2015-01-13 MED ORDER — METOPROLOL TARTRATE 12.5 MG HALF TABLET
12.5000 mg | ORAL_TABLET | Freq: Two times a day (BID) | ORAL | Status: DC
  Administered 2015-01-14: 12.5 mg via ORAL
  Filled 2015-01-13 (×3): qty 1

## 2015-01-13 MED ORDER — PHENYLEPHRINE HCL 10 MG/ML IJ SOLN
0.0000 ug/min | INTRAVENOUS | Status: DC
  Filled 2015-01-13: qty 2

## 2015-01-13 MED ORDER — DOPAMINE-DEXTROSE 3.2-5 MG/ML-% IV SOLN: 0.0000 ug/kg/min | INTRAVENOUS | Status: DC

## 2015-01-13 MED ORDER — PROTAMINE SULFATE 10 MG/ML IV SOLN
INTRAVENOUS | Status: AC
  Filled 2015-01-13: qty 25

## 2015-01-13 MED ORDER — SODIUM CHLORIDE 0.9 % IV SOLN
Freq: Once | INTRAVENOUS | Status: AC
  Administered 2015-01-13: 10:00:00 via INTRAVENOUS

## 2015-01-13 MED ORDER — SODIUM CHLORIDE 0.9 % IV SOLN
INTRAVENOUS | Status: DC
  Administered 2015-01-13: 15:00:00 via INTRAVENOUS

## 2015-01-13 MED ORDER — PROPOFOL 10 MG/ML IV BOLUS
INTRAVENOUS | Status: AC
  Filled 2015-01-13: qty 20

## 2015-01-13 SURGICAL SUPPLY — 106 items
ADAPTER CARDIO PERF ANTE/RETRO (ADAPTER) ×4 IMPLANT
ADPR PRFSN 84XANTGRD RTRGD (ADAPTER) ×2
ATTRACTOMAT 16X20 MAGNETIC DRP (DRAPES) ×2 IMPLANT
BAG DECANTER FOR FLEXI CONT (MISCELLANEOUS) ×4 IMPLANT
BANDAGE ELASTIC 4 VELCRO ST LF (GAUZE/BANDAGES/DRESSINGS) ×4 IMPLANT
BANDAGE ELASTIC 6 VELCRO ST LF (GAUZE/BANDAGES/DRESSINGS) ×4 IMPLANT
BASKET HEART  (ORDER IN 25'S) (MISCELLANEOUS) ×1
BASKET HEART (ORDER IN 25'S) (MISCELLANEOUS) ×1
BASKET HEART (ORDER IN 25S) (MISCELLANEOUS) ×2 IMPLANT
BLADE STERNUM SYSTEM 6 (BLADE) ×4 IMPLANT
BLADE SURG 12 STRL SS (BLADE) ×4 IMPLANT
BLADE SURG ROTATE 9660 (MISCELLANEOUS) ×2 IMPLANT
BNDG GAUZE ELAST 4 BULKY (GAUZE/BANDAGES/DRESSINGS) ×4 IMPLANT
CANISTER SUCTION 2500CC (MISCELLANEOUS) ×4 IMPLANT
CANNULA GUNDRY RCSP 15FR (MISCELLANEOUS) ×4 IMPLANT
CATH CPB KIT VANTRIGT (MISCELLANEOUS) ×4 IMPLANT
CATH ROBINSON RED A/P 18FR (CATHETERS) ×12 IMPLANT
CATH THORACIC 36FR RT ANG (CATHETERS) ×4 IMPLANT
CLIP FOGARTY SPRING 6M (CLIP) ×2 IMPLANT
CLIP RETRACTION 3.0MM CORONARY (MISCELLANEOUS) ×2 IMPLANT
COVER SURGICAL LIGHT HANDLE (MISCELLANEOUS) ×2 IMPLANT
CRADLE DONUT ADULT HEAD (MISCELLANEOUS) ×4 IMPLANT
DRAIN CHANNEL 32F RND 10.7 FF (WOUND CARE) ×4 IMPLANT
DRAPE CARDIOVASCULAR INCISE (DRAPES) ×4
DRAPE SLUSH/WARMER DISC (DRAPES) ×4 IMPLANT
DRAPE SRG 135X102X78XABS (DRAPES) ×2 IMPLANT
DRSG AQUACEL AG ADV 3.5X10 (GAUZE/BANDAGES/DRESSINGS) ×2 IMPLANT
DRSG AQUACEL AG ADV 3.5X14 (GAUZE/BANDAGES/DRESSINGS) ×4 IMPLANT
ELECT BLADE 4.0 EZ CLEAN MEGAD (MISCELLANEOUS) ×4
ELECT BLADE 6.5 EXT (BLADE) ×4 IMPLANT
ELECT CAUTERY BLADE 6.4 (BLADE) ×4 IMPLANT
ELECT REM PT RETURN 9FT ADLT (ELECTROSURGICAL) ×8
ELECTRODE BLDE 4.0 EZ CLN MEGD (MISCELLANEOUS) ×2 IMPLANT
ELECTRODE REM PT RTRN 9FT ADLT (ELECTROSURGICAL) ×4 IMPLANT
GAUZE SPONGE 4X4 12PLY STRL (GAUZE/BANDAGES/DRESSINGS) ×8 IMPLANT
GLOVE BIO SURGEON STRL SZ 6 (GLOVE) ×4 IMPLANT
GLOVE BIO SURGEON STRL SZ 6.5 (GLOVE) ×2 IMPLANT
GLOVE BIO SURGEON STRL SZ7 (GLOVE) ×2 IMPLANT
GLOVE BIO SURGEON STRL SZ7.5 (GLOVE) ×16 IMPLANT
GLOVE BIO SURGEONS STRL SZ 6.5 (GLOVE) ×2
GLOVE BIOGEL PI IND STRL 6 (GLOVE) IMPLANT
GLOVE BIOGEL PI IND STRL 6.5 (GLOVE) IMPLANT
GLOVE BIOGEL PI INDICATOR 6 (GLOVE) ×4
GLOVE BIOGEL PI INDICATOR 6.5 (GLOVE) ×12
GOWN STRL REUS W/ TWL LRG LVL3 (GOWN DISPOSABLE) ×8 IMPLANT
GOWN STRL REUS W/TWL LRG LVL3 (GOWN DISPOSABLE) ×36
HEMOSTAT POWDER SURGIFOAM 1G (HEMOSTASIS) ×16 IMPLANT
HEMOSTAT SURGICEL 2X14 (HEMOSTASIS) ×4 IMPLANT
INSERT FOGARTY XLG (MISCELLANEOUS) IMPLANT
KIT BASIN OR (CUSTOM PROCEDURE TRAY) ×4 IMPLANT
KIT ROOM TURNOVER OR (KITS) ×4 IMPLANT
KIT SUCTION CATH 14FR (SUCTIONS) ×4 IMPLANT
KIT VASOVIEW W/TROCAR VH 2000 (KITS) ×4 IMPLANT
LEAD PACING MYOCARDI (MISCELLANEOUS) ×4 IMPLANT
MARKER GRAFT CORONARY BYPASS (MISCELLANEOUS) ×12 IMPLANT
NS IRRIG 1000ML POUR BTL (IV SOLUTION) ×22 IMPLANT
PACK OPEN HEART (CUSTOM PROCEDURE TRAY) ×4 IMPLANT
PAD ARMBOARD 7.5X6 YLW CONV (MISCELLANEOUS) ×8 IMPLANT
PAD ELECT DEFIB RADIOL ZOLL (MISCELLANEOUS) ×4 IMPLANT
PENCIL BUTTON HOLSTER BLD 10FT (ELECTRODE) ×4 IMPLANT
PUNCH AORTIC ROTATE  4.5MM 8IN (MISCELLANEOUS) ×2 IMPLANT
PUNCH AORTIC ROTATE 4.0MM (MISCELLANEOUS) IMPLANT
PUNCH AORTIC ROTATE 4.5MM 8IN (MISCELLANEOUS) IMPLANT
PUNCH AORTIC ROTATE 5MM 8IN (MISCELLANEOUS) IMPLANT
SET CARDIOPLEGIA MPS 5001102 (MISCELLANEOUS) ×2 IMPLANT
SOLUTION ANTI FOG 6CC (MISCELLANEOUS) ×2 IMPLANT
SPONGE GAUZE 4X4 12PLY STER LF (GAUZE/BANDAGES/DRESSINGS) ×4 IMPLANT
SPONGE LAP 18X18 X RAY DECT (DISPOSABLE) ×4 IMPLANT
SPONGE LAP 4X18 X RAY DECT (DISPOSABLE) ×2 IMPLANT
SURGIFLO W/THROMBIN 8M KIT (HEMOSTASIS) ×4 IMPLANT
SUT BONE WAX W31G (SUTURE) ×4 IMPLANT
SUT MNCRL AB 4-0 PS2 18 (SUTURE) IMPLANT
SUT PROLENE 3 0 SH DA (SUTURE) IMPLANT
SUT PROLENE 3 0 SH1 36 (SUTURE) IMPLANT
SUT PROLENE 4 0 RB 1 (SUTURE) ×4
SUT PROLENE 4 0 SH DA (SUTURE) ×4 IMPLANT
SUT PROLENE 4-0 RB1 .5 CRCL 36 (SUTURE) ×2 IMPLANT
SUT PROLENE 5 0 C 1 36 (SUTURE) IMPLANT
SUT PROLENE 6 0 C 1 30 (SUTURE) IMPLANT
SUT PROLENE 6 0 CC (SUTURE) ×16 IMPLANT
SUT PROLENE 8 0 BV175 6 (SUTURE) ×4 IMPLANT
SUT PROLENE BLUE 7 0 (SUTURE) ×6 IMPLANT
SUT PROLENE POLY MONO (SUTURE) ×2 IMPLANT
SUT SILK  1 MH (SUTURE)
SUT SILK 1 MH (SUTURE) IMPLANT
SUT SILK 2 0 SH CR/8 (SUTURE) ×2 IMPLANT
SUT SILK 3 0 SH CR/8 (SUTURE) IMPLANT
SUT STEEL 6MS V (SUTURE) ×8 IMPLANT
SUT STEEL SZ 6 DBL 3X14 BALL (SUTURE) ×4 IMPLANT
SUT VIC AB 1 CTX 36 (SUTURE) ×8
SUT VIC AB 1 CTX36XBRD ANBCTR (SUTURE) ×4 IMPLANT
SUT VIC AB 2-0 CT1 27 (SUTURE)
SUT VIC AB 2-0 CT1 TAPERPNT 27 (SUTURE) IMPLANT
SUT VIC AB 2-0 CTX 27 (SUTURE) IMPLANT
SUT VIC AB 3-0 X1 27 (SUTURE) IMPLANT
SUTURE E-PAK OPEN HEART (SUTURE) ×4 IMPLANT
SYSTEM SAHARA CHEST DRAIN ATS (WOUND CARE) ×4 IMPLANT
TAPE CLOTH SURG 4X10 WHT LF (GAUZE/BANDAGES/DRESSINGS) ×2 IMPLANT
TAPE PAPER 2X10 WHT MICROPORE (GAUZE/BANDAGES/DRESSINGS) ×2 IMPLANT
TOWEL OR 17X24 6PK STRL BLUE (TOWEL DISPOSABLE) ×8 IMPLANT
TOWEL OR 17X26 10 PK STRL BLUE (TOWEL DISPOSABLE) ×8 IMPLANT
TRAY FOLEY IC TEMP SENS 16FR (CATHETERS) ×4 IMPLANT
TUBE SUCT INTRACARD DLP 20F (MISCELLANEOUS) ×2 IMPLANT
TUBING INSUFFLATION (TUBING) ×4 IMPLANT
UNDERPAD 30X30 INCONTINENT (UNDERPADS AND DIAPERS) ×4 IMPLANT
WATER STERILE IRR 1000ML POUR (IV SOLUTION) ×8 IMPLANT

## 2015-01-13 NOTE — Transfer of Care (Signed)
Immediate Anesthesia Transfer of Care Note  Patient: Eric [name]  Procedure(s) Performed: Procedure(s): CORONARY ARTERY BYPASS GRAFTING (CABG) times four, using left internal mammary artery and left greater saphenous vein. (N/A) TRANSESOPHAGEAL ECHOCARDIOGRAM (TEE) (N/A)  Patient Location: SICU  Anesthesia Type:General  Level of Consciousness: sedated and Patient remains intubated per anesthesia plan  Airway & Oxygen Therapy: Patient remains intubated per anesthesia plan and Patient placed on Ventilator (see vital sign flow sheet for setting)  Post-op Assessment: Report given to RN and Post -op Vital signs reviewed and stable  Post vital signs: Reviewed and stable  Last Vitals:   100/47 (67) HR 89 Sats 96% Placed on ventilator by RT. TV 635ml Rate 10.    Complications: No apparent anesthesia complications

## 2015-01-13 NOTE — Progress Notes (Signed)
Patient Profile: 69 y/o male with h/o HTN admitted for [place]. LHC--> severe 3VD. Plan is for CABG.    Subjective: No complaints. CP free. Family by bedside. Awaiting surgery.    Objective: Vital signs in last 24 hours: Temp:  [97.5 F (36.4 C)-98 F (36.7 C)] 97.7 F (36.5 C) (02/08 0421) Pulse Rate:  [54-77] 61 (02/08 0530) Resp:  [16-18] 18 (02/08 0421) BP: (106-147)/(47-65) 122/63 mmHg (02/08 0421) SpO2:  [97 %-100 %] 100 % (02/08 0421) Weight:  [195 lb 3.2 oz (88.542 kg)] 195 lb 3.2 oz (88.542 kg) (02/08 0421) Last BM Date: 01/12/15  Intake/Output from previous day: 02/07 0701 - 02/08 0700 In: 510 [P.O.:480; Blood:30] Out: -  Intake/Output this shift:    Medications Current Facility-Administered Medications  Medication Dose Route Frequency Provider Last Rate Last Dose  . acetaminophen (TYLENOL) tablet 650 mg  650 mg Oral Q4H PRN Burnell Blanks, MD   650 mg at 01/11/15 2140  . allopurinol (ZYLOPRIM) tablet 100 mg  100 mg Oral Daily Burnell Blanks, MD   100 mg at 01/12/15 0953  . ALPRAZolam Duanne Moron) tablet 0.125 mg  0.125 mg Oral BID PRN Burnell Blanks, MD      . ALPRAZolam Duanne Moron) tablet 0.25-0.5 mg  0.25-0.5 mg Oral Q4H PRN Ivin Poot, MD   0.25 mg at 01/11/15 2140  . aminocaproic acid (AMICAR) 10 g in sodium chloride 0.9 % 100 mL infusion   Intravenous To OR Jettie Booze, MD      . aspirin chewable tablet 81 mg  81 mg Oral Daily Burnell Blanks, MD   81 mg at 01/12/15 0953  . atorvastatin (LIPITOR) tablet 80 mg  80 mg Oral q1800 Burnell Blanks, MD   80 mg at 01/12/15 1851  . cefUROXime (ZINACEF) 1.5 g in dextrose 5 % 50 mL IVPB  1.5 g Intravenous To [place], MD      . cefUROXime (ZINACEF) 750 mg in dextrose 5 % 50 mL IVPB  750 mg Intravenous To OR Jettie Booze, MD      . chlorhexidine (HIBICLENS) 4 % liquid 4 application  60 mL Topical Once Ivin Poot, MD      . dexmedetomidine (PRECEDEX) 400  MCG/100ML (4 mcg/mL) infusion  0.1-0.7 mcg/kg/hr Intravenous To [place], MD      . DOPamine (INTROPIN) 800 mg in dextrose 5 % 250 mL (3.2 mg/mL) infusion  0-10 mcg/kg/min Intravenous To [place], MD      . EPINEPHrine (ADRENALIN) 4 mg in dextrose 5 % 250 mL (0.016 mg/mL) infusion  0-10 mcg/min Intravenous To [place], MD      . heparin 2,500 Units, papaverine 30 mg in electrolyte-148 (PLASMALYTE-148) 500 mL irrigation   Irrigation To OR Jettie Booze, MD      . heparin 30,000 units/NS 1000 mL solution for CELLSAVER   Other To [place], MD      . insulin regular (NOVOLIN R,HUMULIN R) 250 Units in sodium chloride 0.9 % 250 mL (1 Units/mL) infusion   Intravenous To OR Jettie Booze, MD      . isosorbide mononitrate (IMDUR) 24 hr tablet 15 mg  15 mg Oral Daily Burnell Blanks, MD   15 mg at 01/12/15 0953  . lisinopril (PRINIVIL,ZESTRIL) tablet 20 mg  20 mg Oral Daily Burnell Blanks, MD   20 mg at 01/12/15 0953  . loratadine (CLARITIN)  tablet 10 mg  10 mg Oral Daily Burnell Blanks, MD   10 mg at 01/12/15 0953  . magnesium sulfate (IV Push/IM) injection 40 mEq  40 mEq Other To OR Jettie Booze, MD      . metoprolol tartrate (LOPRESSOR) tablet 25 mg  25 mg Oral BID Burnell Blanks, MD   25 mg at 01/12/15 2258  . nitroGLYCERIN 50 mg in dextrose 5 % 250 mL (0.2 mg/mL) infusion  2-200 mcg/min Intravenous To OR Jettie Booze, MD      . ondansetron [facility]) injection 4 mg  4 mg Intravenous Q6H PRN Burnell Blanks, MD      . phenylephrine (NEO-SYNEPHRINE) 20 mg in dextrose 5 % 250 mL (0.08 mg/mL) infusion  30-200 mcg/min Intravenous To OR Jettie Booze, MD      . potassium chloride injection 80 mEq  80 mEq Other To OR Jettie Booze, MD      . predniSONE (DELTASONE) tablet 10 mg  10 mg Oral Daily Burnell Blanks, MD   10 mg at 01/13/15 0700  . vancomycin (VANCOCIN) 1,500 mg in  sodium chloride 0.9 % 250 mL IVPB  1,500 mg Intravenous To OR Jettie Booze, MD        PE: General appearance: alert, cooperative and no distress Neck: no carotid bruit and no JVD Lungs: clear to auscultation bilaterally Heart: regular rate and rhythm and 2/6 SM at RUSB Extremities: no LEE Pulses: 2+ and symmetric Skin: warm and dry Neurologic: Grossly normal  Lab Results:   Recent Labs  01/11/15 0350 01/12/15 0553 01/12/15 0942  WBC 6.2 5.8 7.5  HGB 8.2* 7.9* 9.1*  HCT 26.1* 25.0* 28.0*  PLT 47* 68* 62*   BMET  Recent Labs  01/11/15 0350 01/12/15 0553 01/12/15 0942  NA 139 138 139  K 4.3 4.4 4.1  CL 106 106 106  CO2 28 27 27   GLUCOSE 97 97 161*  BUN 17 21 20   CREATININE 0.73 0.75 0.82  CALCIUM 8.7 8.8 8.8    Assessment/Plan  Principal Problem:   Unstable angina Active Problems:   MDS (myelodysplastic syndrome), low grade   Rheumatoid arthritis   Anemia in neoplastic disease   Thrombocytopenia   HTN (hypertension)   Dyslipidemia, low HDL  Plan:   1. CAD: CP free. Proceed with CABG for multivessel dz. Will follow post-op.    LOS: 4 days    Tanysha Quant M. Ladoris Gene 01/13/2015 6:43 AM

## 2015-01-13 NOTE — Progress Notes (Signed)
  Echocardiogram Echocardiogram Transesophageal has been performed.  Donata Clay 01/13/2015, 8:58 AM

## 2015-01-13 NOTE — Anesthesia Preprocedure Evaluation (Addendum)
Anesthesia Evaluation  Patient identified by MRN, date of birth, ID band Patient awake    Reviewed: Allergy & Precautions, NPO status , Patient's Chart, lab work & pertinent test results, reviewed documented beta blocker date and time   History of Anesthesia Complications Negative for: history of anesthetic complications  Airway Mallampati: II  TM Distance: >3 FB Neck ROM: Full    Dental  (+) Dental Advisory Given   Pulmonary shortness of breath and with exertion, former smoker,  breath sounds clear to auscultation        Cardiovascular hypertension, Pt. on medications and Pt. on home beta blockers + angina with exertion + CAD Rhythm:Regular Rate:Normal  2/16 ECHO: EF 45-62%, grade 1 diastolic dysfunction, valves OK   Neuro/Psych  Headaches,    GI/Hepatic negative GI ROS, Neg liver ROS,   Endo/Other  Morbid obesity  Renal/GU negative Renal ROS     Musculoskeletal  (+) Arthritis -, Rheumatoid disorders,  Pt on prednisone daily. Pt received Solumedrol this am.   Abdominal (+) + obese,   Peds  Hematology  (+) Blood dyscrasia (H/H 9.1/28.0hb 9.1, plt 62K), anemia , Pt has myelodysplagia   Anesthesia Other Findings   Reproductive/Obstetrics                           Anesthesia Physical Anesthesia Plan  ASA: III  Anesthesia Plan: General   Post-op Pain Management:    Induction: Intravenous  Airway Management Planned: Oral ETT  Additional Equipment: Arterial line, PA Cath, 3D TEE and Ultrasound Guidance Line Placement  Intra-op Plan:   Post-operative Plan: Post-operative intubation/ventilation  Informed Consent: I have reviewed the patients History and Physical, chart, labs and discussed the procedure including the risks, benefits and alternatives for the proposed anesthesia with the patient or authorized representative who has indicated his/her understanding and acceptance.   Dental  advisory given  Plan Discussed with: CRNA and Surgeon  Anesthesia Plan Comments: (Plan routine monitors, A line, PA cath, GETA with TEE and post op ventilation Transfusion likely)        Anesthesia Quick Evaluation

## 2015-01-13 NOTE — Progress Notes (Signed)
The patient was examined and preop studies reviewed. There has been no change from the prior exam and the patient is ready for surgery.  plan CABG on L [place]  today

## 2015-01-13 NOTE — Progress Notes (Signed)
Patient ID: Eric [name], male   DOB: [birth year], 69 y.o.   MRN: [id number] EVENING ROUNDS NOTE :     [address]       [place] [address]             [phone]                 Day of Surgery Procedure(s) (LRB): CORONARY ARTERY BYPASS GRAFTING (CABG) times four, using left internal mammary artery and left greater saphenous vein. (N/A) TRANSESOPHAGEAL ECHOCARDIOGRAM (TEE) (N/A)  Total Length of Stay:  LOS: 4 days  BP 102/58 mmHg  Pulse 83  Temp(Src) 99.3 F (37.4 C) (Core (Comment))  Resp 20  Ht 5\' 8"  (1.727 m)  Wt 195 lb 3.2 oz (88.542 kg)  BMI 29.69 kg/m2  SpO2 99%  .Intake/Output      02/07 0701 - 02/08 0700 02/08 0701 - 02/09 0700   P.O. 480    I.V. (mL/kg)  2820.9 (31.9)   Blood 30 2200   IV Piggyback  400   Total Intake(mL/kg) 510 (5.8) 5420.9 (61.2)   Urine (mL/kg/hr)  1635 (1.6)   Blood  925 (0.9)   Chest Tube  102 (0.1)   Total Output   2662   Net +510 +2758.9        Urine Occurrence 2 x      . sodium chloride 10 mL/hr at 01/13/15 1435  . [START ON 01/14/2015] sodium chloride    . sodium chloride 10 mL/hr at 01/13/15 1440  . dexmedetomidine 0 mcg/kg/hr (01/13/15 1735)  . DOPamine 2 mcg/kg/min (01/13/15 1600)  . insulin (NOVOLIN-R) infusion 3 Units/hr (01/13/15 1800)  . lactated ringers 10 mL/hr at 01/13/15 1450  . nitroGLYCERIN Stopped (01/13/15 1435)  . phenylephrine (NEO-SYNEPHRINE) Adult infusion Stopped (01/13/15 1435)     Lab Results  Component Value Date   WBC 23.9* 01/13/2015   HGB 8.5* 01/13/2015   HCT 25.0* 01/13/2015   PLT 72* 01/13/2015   GLUCOSE 132* 01/13/2015   CHOL 116 01/10/2015   TRIG 120 01/10/2015   HDL 30* 01/10/2015   LDLCALC 62 01/10/2015   ALT 15 11/22/2013   AST 15 11/22/2013   NA 142 01/13/2015   K 3.9 01/13/2015   CL 104 01/13/2015   CREATININE 0.70 01/13/2015   BUN 22 01/13/2015   CO2 27 01/12/2015   TSH 1.030 02/26/2011   INR 1.48 01/13/2015   HGBA1C 5.5 01/12/2015   Remains on vent  starting to wake up Not bleeding , plts 72,000  Grace Isaac MD  Beeper [phone] Office [phone] 01/13/2015 6:31 PM

## 2015-01-13 NOTE — Brief Op Note (Signed)
01/09/2015 - 01/13/2015      [address]       [place] [address]             [phone]     01/09/2015 - 01/13/2015  12:35 PM  PATIENT:  Eric [name]  69 y.o. male  PRE-OPERATIVE DIAGNOSIS:  CAD  POST-OPERATIVE DIAGNOSIS:  CAD  PROCEDURE:  Procedure(s): CORONARY ARTERY BYPASS GRAFTING (CABG) times four, using left internal mammary artery and left greater saphenous vein. LIMA-LAD; SVG-OM; SVG-PD; SVG-DIAG EVH LEFT LEG TRANSESOPHAGEAL ECHOCARDIOGRAM (TEE)  SURGEON:  Surgeon(s): Ivin Poot, MD  PHYSICIAN ASSISTANT: Yamileth Hayse PA-C  ANESTHESIA:   general  PATIENT CONDITION:  ICU - intubated and hemodynamically stable.  PRE-OPERATIVE WEIGHT: 88kg  EBL: SEE ANEST/PERFUSION RECORDS  COMPLICATIONS: NO KNOWN

## 2015-01-13 NOTE — Anesthesia Postprocedure Evaluation (Signed)
  Anesthesia Post-op Note  Patient: Eric [name]  Procedure(s) Performed: Procedure(s): CORONARY ARTERY BYPASS GRAFTING (CABG) times four, using left internal mammary artery and left greater saphenous vein. (N/A) TRANSESOPHAGEAL ECHOCARDIOGRAM (TEE) (N/A)  Patient Location: PACU  Anesthesia Type:General  Level of Consciousness: Patient remains intubated per anesthesia plan  Airway and Oxygen Therapy: Patient remains intubated per anesthesia plan  Post-op Pain: none  Post-op Assessment: Post-op Vital signs reviewed  Post-op Vital Signs: Reviewed  Last Vitals:  Filed Vitals:   01/13/15 1900  BP: 112/59  Pulse: 85  Temp: 37.3 C  Resp: 22    Complications: No apparent anesthesia complications

## 2015-01-13 NOTE — Anesthesia Procedure Notes (Signed)
Anesthesia Procedure Note PA catheter:  Routine monitors. Timeout, sterile prep, drape, FBP R neck.  Trendelenburg position.  1% [place] local, finder and trocar RIJ 1st pass with US guidance.  Cordis placed over J wire. PA catheter in easily.  Sterile dressing applied.  Patient tolerated well, VSS.  Jenita Seashore, MD  [phone]

## 2015-01-13 NOTE — Procedures (Signed)
Extubation Procedure Note  Patient Details:   Name: Eric [name] DOB: 08-26-1946 MRN: [id number]   Airway Documentation:  Airway 7.5 mm (Active)  Secured at (cm) 23 cm 01/13/2015  3:49 PM  Measured From Lips 01/13/2015  3:49 PM  Secured Location Right 01/13/2015  3:49 PM  Secured By Pink Tape 01/13/2015  3:49 PM  Site Condition Dry 01/13/2015  3:49 PM    Evaluation  O2 sats: stable throughout Complications: No apparent complications Patient did tolerate procedure well. Bilateral Breath Sounds: Clear Suctioning: Airway Yes  Chriss Driver [facility] 01/13/2015, 7:17 PM

## 2015-01-13 NOTE — OR Nursing (Signed)
0934 chest incision made

## 2015-01-14 ENCOUNTER — Telehealth: Payer: Self-pay | Admitting: Hematology and Oncology

## 2015-01-14 ENCOUNTER — Inpatient Hospital Stay (HOSPITAL_COMMUNITY): Payer: Medicare Other

## 2015-01-14 ENCOUNTER — Encounter (HOSPITAL_COMMUNITY): Payer: Self-pay | Admitting: Cardiothoracic Surgery

## 2015-01-14 LAB — GLUCOSE, CAPILLARY
GLUCOSE-CAPILLARY: 148 mg/dL — AB (ref 70–99)
GLUCOSE-CAPILLARY: 147 mg/dL — AB (ref 70–99)
Glucose-Capillary: 158 mg/dL — ABNORMAL HIGH (ref 70–99)
Glucose-Capillary: 123 mg/dL — ABNORMAL HIGH (ref 70–99)

## 2015-01-14 LAB — POCT I-STAT, CHEM 8
BUN: 28 mg/dL — ABNORMAL HIGH (ref 6–23)
CHLORIDE: 100 mmol/L (ref 96–112)
Calcium, Ion: 1.17 mmol/L (ref 1.13–1.30)
Creatinine, Ser: 1.6 mg/dL — ABNORMAL HIGH (ref 0.50–1.35)
Glucose, Bld: 149 mg/dL — ABNORMAL HIGH (ref 70–99)
HCT: 33 % — ABNORMAL LOW (ref 39.0–52.0)
Hemoglobin: 11.2 g/dL — ABNORMAL LOW (ref 13.0–17.0)
POTASSIUM: 4.7 mmol/L (ref 3.5–5.1)
SODIUM: 137 mmol/L (ref 135–145)
TCO2: 21 mmol/L (ref 0–100)

## 2015-01-14 LAB — BASIC METABOLIC PANEL
ANION GAP: 7 (ref 5–15)
BUN: 22 mg/dL (ref 6–23)
CHLORIDE: 107 mmol/L (ref 96–112)
CO2: 25 mmol/L (ref 19–32)
Calcium: 8.3 mg/dL — ABNORMAL LOW (ref 8.4–10.5)
Creatinine, Ser: 0.84 mg/dL (ref 0.50–1.35)
GFR calc Af Amer: >90 mL/min (ref >90)
GFR calc non Af Amer: 88 mL/min — ABNORMAL LOW (ref >90)
Glucose, Bld: 104 mg/dL — ABNORMAL HIGH (ref 70–99)
POTASSIUM: 4.2 mmol/L (ref 3.5–5.1)
Sodium: 139 mmol/L (ref 135–145)

## 2015-01-14 LAB — PREPARE CRYOPRECIPITATE
Unit division: 0
Unit division: 0

## 2015-01-14 LAB — PREPARE PLATELET PHERESIS
Unit division: 0
Unit division: 0
Unit division: 0

## 2015-01-14 LAB — CBC
HCT: 27.7 % — ABNORMAL LOW (ref 39.0–52.0)
HCT: 30.9 % — ABNORMAL LOW (ref 39.0–52.0)
HEMOGLOBIN: 9.3 g/dL — AB (ref 13.0–17.0)
Hemoglobin: 10.3 g/dL — ABNORMAL LOW (ref 13.0–17.0)
MCH: 27 pg (ref 26.0–34.0)
MCH: 27.2 pg (ref 26.0–34.0)
MCHC: 33.6 g/dL (ref 30.0–36.0)
MCHC: 33.3 g/dL (ref 30.0–36.0)
MCV: 80.3 fL (ref 78.0–100.0)
MCV: 81.7 fL (ref 78.0–100.0)
PLATELETS: 127 10*3/uL — AB (ref 150–400)
Platelets: 80 10*3/uL — ABNORMAL LOW (ref 150–400)
RBC: 3.45 MIL/uL — ABNORMAL LOW (ref 4.22–5.81)
RBC: 3.78 MIL/uL — ABNORMAL LOW (ref 4.22–5.81)
RDW: 23.2 % — ABNORMAL HIGH (ref 11.5–15.5)
RDW: 24.8 % — ABNORMAL HIGH (ref 11.5–15.5)
WBC: 24.8 10*3/uL — ABNORMAL HIGH (ref 4.0–10.5)
WBC: 40.1 10*3/uL — AB (ref 4.0–10.5)

## 2015-01-14 LAB — CREATININE, SERUM
Creatinine, Ser: 1.8 mg/dL — ABNORMAL HIGH (ref 0.50–1.35)
GFR calc Af Amer: 43 mL/min — ABNORMAL LOW (ref >90)
GFR calc non Af Amer: 37 mL/min — ABNORMAL LOW (ref >90)

## 2015-01-14 LAB — PREPARE FRESH FROZEN PLASMA
Unit division: 0
Unit division: 0

## 2015-01-14 LAB — MAGNESIUM
MAGNESIUM: 2.4 mg/dL (ref 1.5–2.5)
Magnesium: 2.3 mg/dL (ref 1.5–2.5)

## 2015-01-14 MED ORDER — PREDNISONE 10 MG PO TABS
10.0000 mg | ORAL_TABLET | Freq: Every day | ORAL | Status: DC
  Administered 2015-01-14 – 2015-01-23 (×10): 10 mg via ORAL
  Filled 2015-01-14 (×13): qty 1

## 2015-01-14 MED ORDER — METOCLOPRAMIDE HCL 5 MG/ML IJ SOLN
10.0000 mg | Freq: Four times a day (QID) | INTRAMUSCULAR | Status: DC
  Administered 2015-01-14 – 2015-01-18 (×16): 10 mg via INTRAVENOUS
  Filled 2015-01-14 (×21): qty 2

## 2015-01-14 MED ORDER — FUROSEMIDE 10 MG/ML IJ SOLN
20.0000 mg | Freq: Every day | INTRAMUSCULAR | Status: DC
  Administered 2015-01-14: 20 mg via INTRAVENOUS
  Filled 2015-01-14: qty 2

## 2015-01-14 MED ORDER — INSULIN ASPART 100 UNIT/ML ~~LOC~~ SOLN
0.0000 [IU] | SUBCUTANEOUS | Status: DC
  Administered 2015-01-14 – 2015-01-16 (×7): 2 [IU] via SUBCUTANEOUS
  Administered 2015-01-16: 4 [IU] via SUBCUTANEOUS
  Administered 2015-01-16: 2 [IU] via SUBCUTANEOUS
  Administered 2015-01-16: 4 [IU] via SUBCUTANEOUS
  Administered 2015-01-17 – 2015-01-18 (×7): 2 [IU] via SUBCUTANEOUS

## 2015-01-14 MED ORDER — BISACODYL 10 MG RE SUPP
10.0000 mg | Freq: Every day | RECTAL | Status: DC | PRN
  Filled 2015-01-14: qty 1

## 2015-01-14 MED ORDER — FUROSEMIDE 10 MG/ML IJ SOLN: 20.0000 mg | Freq: Once | INTRAMUSCULAR | Status: DC

## 2015-01-14 MED ORDER — BISACODYL 10 MG RE SUPP
10.0000 mg | Freq: Once | RECTAL | Status: AC
  Administered 2015-01-14: 10 mg via RECTAL
  Filled 2015-01-14: qty 1

## 2015-01-14 MED ORDER — INSULIN ASPART 100 UNIT/ML ~~LOC~~ SOLN: 0.0000 [IU] | SUBCUTANEOUS | Status: DC

## 2015-01-14 MED ORDER — INSULIN DETEMIR 100 UNIT/ML ~~LOC~~ SOLN
12.0000 [IU] | Freq: Every day | SUBCUTANEOUS | Status: DC
  Administered 2015-01-14: 12 [IU] via SUBCUTANEOUS
  Filled 2015-01-14 (×2): qty 0.12

## 2015-01-14 MED ORDER — DOPAMINE-DEXTROSE 3.2-5 MG/ML-% IV SOLN
3.0000 ug/kg/min | INTRAVENOUS | Status: DC
  Administered 2015-01-14 – 2015-01-16 (×2): 3 ug/kg/min via INTRAVENOUS
  Filled 2015-01-14 (×2): qty 250

## 2015-01-14 MED FILL — Potassium Chloride Inj 2 mEq/ML: INTRAVENOUS | Qty: 40 | Status: AC

## 2015-01-14 MED FILL — Magnesium Sulfate Inj 50%: INTRAMUSCULAR | Qty: 10 | Status: AC

## 2015-01-14 MED FILL — Heparin Sodium (Porcine) Inj 1000 Unit/ML: INTRAMUSCULAR | Qty: 30 | Status: AC

## 2015-01-14 NOTE — Plan of Care (Signed)
Problem: Phase II - Intermediate Post-Op Goal: Wean to Extubate Outcome: Completed/Met Date Met:  01/14/15 Extubated at 1830 Goal: Maintain Hemodynamic Stability Outcome: Completed/Met Date Met:  01/14/15 Off pressors with stable BP Goal: CBGs/Blood Glucose per SCIP Criteria Outcome: Progressing Off insulin gtt

## 2015-01-14 NOTE — Progress Notes (Signed)
Patient ID: Eric [name], male   DOB: 1946-10-23, 69 y.o.   MRN: [id number]  SICU Evening Rounds  Hemodynamically stable with borderline BP, sinus rhythm.  Creatinine has gone from 0.8 this am to 1.8 this pm. Urine output was fairly good last night but has decreased throughout the day. 10 cc last hr. Has been off dopamine today.  Awake and alert.  CBC    Component Value Date/Time   WBC 40.1* 01/14/2015 1420   WBC 9.0 12/26/2014 0755   RBC 3.78* 01/14/2015 1420   RBC 3.65* 01/10/2015 1209   RBC 3.49* 12/26/2014 0755   HGB 11.2* 01/14/2015 1620   HGB 8.6* 12/26/2014 0755   HCT 33.0* 01/14/2015 1620   HCT 27.0* 12/26/2014 0755   PLT 127* 01/14/2015 1420   PLT 84 Large & giant platelets* 12/26/2014 0755   MCV 81.7 01/14/2015 1420   MCV 77.4* 12/26/2014 0755   MCH 27.2 01/14/2015 1420   MCH 24.6* 12/26/2014 0755   MCHC 33.3 01/14/2015 1420   MCHC 31.9* 12/26/2014 0755   RDW 24.8* 01/14/2015 1420   RDW 27.8* 12/26/2014 0755   LYMPHSABS 1.9 12/31/2014 1507   LYMPHSABS 2.4 12/26/2014 0755   MONOABS 1.9* 12/31/2014 1507   MONOABS 0.9 12/26/2014 0755   EOSABS 0.0 12/31/2014 1507   EOSABS 0.0 12/26/2014 0755   BASOSABS 0.0 12/31/2014 1507   BASOSABS 0.0 12/26/2014 0755    BMET    Component Value Date/Time   NA 137 01/14/2015 1620   NA 138 05/30/2014 1138   K 4.7 01/14/2015 1620   K 3.7 05/30/2014 1138   CL 100 01/14/2015 1620   CL 102 04/09/2013 1347   CO2 25 01/14/2015 0415   CO2 24 05/30/2014 1138   GLUCOSE 149* 01/14/2015 1620   GLUCOSE 147* 05/30/2014 1138   GLUCOSE 113* 04/09/2013 1347   BUN 28* 01/14/2015 1620   BUN 26.2* 05/30/2014 1138   CREATININE 1.80* 01/14/2015 1725   CREATININE 0.8 05/30/2014 1138   CALCIUM 8.3* 01/14/2015 0415   CALCIUM 9.8 05/30/2014 1138   GFRNONAA 37* 01/14/2015 1725   GFRAA 43* 01/14/2015 1725    A/P:  He has acute postop renal failure today. Will restart low dose dopamine and observe. Hold BB for now with borderline BP.

## 2015-01-14 NOTE — Telephone Encounter (Signed)
returned call and lvm for pt to confirm that appt cx due to pt in hospital

## 2015-01-14 NOTE — Progress Notes (Addendum)
TCTS DAILY ICU PROGRESS NOTE                   [address]            [place] [address]          [phone]   1 Day Post-Op Procedure(s) (LRB): CORONARY ARTERY BYPASS GRAFTING (CABG) times four, using left internal mammary artery and left greater saphenous vein. (N/A) TRANSESOPHAGEAL ECHOCARDIOGRAM (TEE) (N/A)  Total Length of Stay:  LOS: 5 days   Subjective: Patient just finished using incentive spirometer.   Objective: Vital signs in last 24 hours: Temp:  [98.2 F (36.8 C)-99.5 F (37.5 C)] 98.6 F (37 C) (02/09 0741) Pulse Rate:  [79-92] 79 (02/09 0700) Cardiac Rhythm:  [-] Normal sinus rhythm;Atrial paced (02/09 0600) Resp:  [15-31] 19 (02/09 0700) BP: (97-120)/(55-68) 112/68 mmHg (02/09 0700) SpO2:  [95 %-100 %] 98 % (02/09 0700) Arterial Line BP: (89-212)/(45-181) 97/52 mmHg (02/09 0700) FiO2 (%):  [40 %-50 %] 40 % (02/08 1710) Weight:  [201 lb 15.1 oz (91.6 kg)] 201 lb 15.1 oz (91.6 kg) (02/09 0500)  Filed Weights   01/10/15 0516 01/13/15 0421 01/14/15 0500  Weight: 194 lb 10.7 oz (88.3 kg) 195 lb 3.2 oz (88.542 kg) 201 lb 15.1 oz (91.6 kg)    Weight change: 6 lb 11.9 oz (3.058 kg)   Hemodynamic parameters for last 24 hours: PAP: (24-46)/(9-30) 35/18 mmHg CO:  [7 L/min-9.1 L/min] 7 L/min CI:  [3.5 L/min/m2-4.5 L/min/m2] 3.5 L/min/m2  Intake/Output from previous day: 02/08 0701 - 02/09 0700 In: 6374.7 [I.V.:3474.7; Blood:2200; IV Piggyback:700] Out: 4081 [Urine:2835; Blood:925; Chest Tube:436]     Current Meds: Scheduled Meds: . acetaminophen  1,000 mg Oral 4 times per day   Or  . acetaminophen (TYLENOL) oral liquid 160 mg/5 mL  1,000 mg Per Tube 4 times per day  . allopurinol  100 mg Oral Daily  . antiseptic oral rinse  7 mL Mouth Rinse BID  . aspirin EC  325 mg Oral Daily   Or  . aspirin  324 mg Per Tube Daily  . atorvastatin  80 mg Oral q1800  . bisacodyl  10 mg Oral Daily   Or  . bisacodyl  10 mg Rectal Daily  . cefUROXime  (ZINACEF)  IV  1.5 g Intravenous Q12H  . docusate sodium  200 mg Oral Daily  . famotidine (PEPCID) IV  20 mg Intravenous Q12H  . furosemide  20 mg Intravenous Daily  . insulin aspart  0-24 Units Subcutaneous 6 times per day  . insulin detemir  12 Units Subcutaneous Daily  . metoCLOPramide (REGLAN) injection  10 mg Intravenous 4 times per day  . metoprolol tartrate  12.5 mg Oral BID   Or  . metoprolol tartrate  12.5 mg Per Tube BID  . [START ON 01/15/2015] pantoprazole  40 mg Oral Daily  . predniSONE  10 mg Oral QAC breakfast  . sodium chloride  3 mL Intravenous Q12H   Continuous Infusions: . sodium chloride 20 mL/hr at 01/13/15 2000  . sodium chloride    . sodium chloride 20 mL/hr at 01/13/15 2000  . lactated ringers 20 mL/hr at 01/13/15 2000  . nitroGLYCERIN Stopped (01/13/15 1435)  . phenylephrine (NEO-SYNEPHRINE) Adult infusion Stopped (01/13/15 1435)   PRN Meds:.albumin human, metoprolol, midazolam, morphine injection, ondansetron (ZOFRAN) IV, oxyCODONE, sodium chloride, traMADol  General appearance: alert, cooperative and no distress Neurologic: intact Heart: RRR, rub with CTs in place Lungs: Diminished  at bases Abdomen: Semi soft, distended, a few bowel sounds, non tender Extremities: RLE with SCD in place;mild LE edema Wounds: Aquacel in place on sternum;LLE dressings are clean and dry  Lab Results: CBC: Recent Labs  01/13/15 2045 01/13/15 2058 01/14/15 0415  WBC 19.1*  --  24.8*  HGB 9.0* 8.8* 9.3*  HCT 25.9* 26.0* 27.7*  PLT PLATELET CLUMPS NOTED ON SMEAR, COUNT APPEARS ADEQUATE  --  80*   BMET:  Recent Labs  01/12/15 0942  01/13/15 2058 01/14/15 0415  NA 139  < > 141 139  K 4.1  < > 4.4 4.2  CL 106  < > 105 107  CO2 27  --   --  25  GLUCOSE 161*  < > 111* 104*  BUN 20  < > 23 22  CREATININE 0.82  < > 0.80 0.84  CALCIUM 8.8  --   --  8.3*  < > = values in this interval not displayed.  PT/INR:  Recent Labs  01/13/15 1427  LABPROT 18.1*  INR 1.48     Radiology: Dg Chest Port 1 View  01/13/2015   CLINICAL DATA:  Status post coronary bypass  EXAM: PORTABLE CHEST - 1 VIEW  COMPARISON:  01/10/2015  FINDINGS: Endotracheal tube 4.4 cm above the carina. Swan-Ganz catheter in the central pulmonary outflow tract. NG tube enters the stomach. Left chest tube noted. Cardiomegaly evident with mild vascular congestion and low lung volumes. Bibasilar atelectasis noted. No large effusion or pneumothorax. Trachea midline.  IMPRESSION: Support apparatus in good position.  Low volume exam with vascular congestion and basilar atelectasis.  No effusion or pneumothorax.   Electronically Signed   By: Daryll Brod M.D.   On: 01/13/2015 14:51     Assessment/Plan: S/P Procedure(s) (LRB): CORONARY ARTERY BYPASS GRAFTING (CABG) times four, using left internal mammary artery and left greater saphenous vein. (N/A) TRANSESOPHAGEAL ECHOCARDIOGRAM (TEE) (N/A)  1. CV- SR in the 80's this am. On Lopressor 12.5 bid. 2. Pulmonary-On 4 liters of oxygen via [place]. Chest tubes with 436 cc since surgery. Chest tubes to remain for now. CXR shows no pneumothorax, cardiomegaly, and gastric distention. Encourage incentive spirometer.   3. Volume Overload-to be given Lasix 20 IV 4. ABL anemia-H and H stable at 9.3 and 27.7 5. GI-sips of clear liquids only today secondary to gastric dilatation. Reglan also ordered. 6. CBGs 132/11/104. On Insulin drip. Pre op HGA1C 5.5. Will stop accu checks and SS after transfer to [facility]/2000. 7. Thrombocytopneia-platelets 80,000. Patient with history of myelodysplasia and chronically low platelet count. 8. Please see progression orders   Cyan Moultrie M PA-C 01/14/2015 8:17 AM

## 2015-01-15 ENCOUNTER — Inpatient Hospital Stay (HOSPITAL_COMMUNITY): Payer: Medicare Other

## 2015-01-15 LAB — COMPREHENSIVE METABOLIC PANEL
ALT: 25 U/L (ref 0–53)
AST: 34 U/L (ref 0–37)
Albumin: 3.5 g/dL (ref 3.5–5.2)
Alkaline Phosphatase: 55 U/L (ref 39–117)
Anion gap: 11 (ref 5–15)
BILIRUBIN TOTAL: 1.6 mg/dL — AB (ref 0.3–1.2)
BUN: 38 mg/dL — ABNORMAL HIGH (ref 6–23)
CHLORIDE: 100 mmol/L (ref 96–112)
CO2: 24 mmol/L (ref 19–32)
Calcium: 8.3 mg/dL — ABNORMAL LOW (ref 8.4–10.5)
Creatinine, Ser: 2.61 mg/dL — ABNORMAL HIGH (ref 0.50–1.35)
GFR calc Af Amer: 27 mL/min — ABNORMAL LOW (ref >90)
GFR calc non Af Amer: 24 mL/min — ABNORMAL LOW (ref >90)
Glucose, Bld: 138 mg/dL — ABNORMAL HIGH (ref 70–99)
Potassium: 4.9 mmol/L (ref 3.5–5.1)
Sodium: 135 mmol/L (ref 135–145)
Total Protein: 6.6 g/dL (ref 6.0–8.3)

## 2015-01-15 LAB — GLUCOSE, CAPILLARY
GLUCOSE-CAPILLARY: 84 mg/dL (ref 70–99)
GLUCOSE-CAPILLARY: 108 mg/dL — AB (ref 70–99)
GLUCOSE-CAPILLARY: 110 mg/dL — AB (ref 70–99)
GLUCOSE-CAPILLARY: 101 mg/dL — AB (ref 70–99)
GLUCOSE-CAPILLARY: 107 mg/dL — AB (ref 70–99)
GLUCOSE-CAPILLARY: 130 mg/dL — AB (ref 70–99)
GLUCOSE-CAPILLARY: 97 mg/dL (ref 70–99)
GLUCOSE-CAPILLARY: 126 mg/dL — AB (ref 70–99)
GLUCOSE-CAPILLARY: 133 mg/dL — AB (ref 70–99)
GLUCOSE-CAPILLARY: 115 mg/dL — AB (ref 70–99)
GLUCOSE-CAPILLARY: 101 mg/dL — AB (ref 70–99)
Glucose-Capillary: 121 mg/dL — ABNORMAL HIGH (ref 70–99)
Glucose-Capillary: 108 mg/dL — ABNORMAL HIGH (ref 70–99)
Glucose-Capillary: 110 mg/dL — ABNORMAL HIGH (ref 70–99)
Glucose-Capillary: 89 mg/dL (ref 70–99)
Glucose-Capillary: 98 mg/dL (ref 70–99)
Glucose-Capillary: 95 mg/dL (ref 70–99)
Glucose-Capillary: 100 mg/dL — ABNORMAL HIGH (ref 70–99)
Glucose-Capillary: 120 mg/dL — ABNORMAL HIGH (ref 70–99)
Glucose-Capillary: 96 mg/dL (ref 70–99)

## 2015-01-15 LAB — BASIC METABOLIC PANEL
Anion gap: 8 (ref 5–15)
BUN: 43 mg/dL — ABNORMAL HIGH (ref 6–23)
CO2: 26 mmol/L (ref 19–32)
Calcium: 7.9 mg/dL — ABNORMAL LOW (ref 8.4–10.5)
Chloride: 98 mmol/L (ref 96–112)
Creatinine, Ser: 3.61 mg/dL — ABNORMAL HIGH (ref 0.50–1.35)
GFR calc Af Amer: 18 mL/min — ABNORMAL LOW (ref >90)
GFR calc non Af Amer: 16 mL/min — ABNORMAL LOW (ref >90)
Glucose, Bld: 146 mg/dL — ABNORMAL HIGH (ref 70–99)
Potassium: 4.6 mmol/L (ref 3.5–5.1)
Sodium: 132 mmol/L — ABNORMAL LOW (ref 135–145)

## 2015-01-15 LAB — CBC
HCT: 30.6 % — ABNORMAL LOW (ref 39.0–52.0)
HCT: 27.4 % — ABNORMAL LOW (ref 39.0–52.0)
Hemoglobin: 10 g/dL — ABNORMAL LOW (ref 13.0–17.0)
Hemoglobin: 9 g/dL — ABNORMAL LOW (ref 13.0–17.0)
MCH: 26.8 pg (ref 26.0–34.0)
MCH: 27 pg (ref 26.0–34.0)
MCHC: 32.7 g/dL (ref 30.0–36.0)
MCHC: 32.8 g/dL (ref 30.0–36.0)
MCV: 82 fL (ref 78.0–100.0)
MCV: 82.3 fL (ref 78.0–100.0)
Platelets: 71 10*3/uL — ABNORMAL LOW (ref 150–400)
Platelets: 90 10*3/uL — ABNORMAL LOW (ref 150–400)
RBC: 3.73 MIL/uL — ABNORMAL LOW (ref 4.22–5.81)
RBC: 3.33 MIL/uL — ABNORMAL LOW (ref 4.22–5.81)
RDW: 25.3 % — ABNORMAL HIGH (ref 11.5–15.5)
RDW: 25.6 % — ABNORMAL HIGH (ref 11.5–15.5)
WBC: 42.5 10*3/uL — ABNORMAL HIGH (ref 4.0–10.5)
WBC: 40.8 10*3/uL — ABNORMAL HIGH (ref 4.0–10.5)

## 2015-01-15 MED ORDER — ALBUMIN HUMAN 5 % IV SOLN
12.5000 g | Freq: Once | INTRAVENOUS | Status: AC
  Administered 2015-01-15: 12.5 g via INTRAVENOUS
  Filled 2015-01-15: qty 250

## 2015-01-15 MED ORDER — METOPROLOL TARTRATE 12.5 MG HALF TABLET
12.5000 mg | ORAL_TABLET | Freq: Two times a day (BID) | ORAL | Status: DC
  Filled 2015-01-15 (×2): qty 1

## 2015-01-15 MED ORDER — LACTATED RINGERS IV SOLN: INTRAVENOUS | Status: DC

## 2015-01-15 MED ORDER — ALBUMIN HUMAN 5 % IV SOLN
INTRAVENOUS | Status: AC
  Administered 2015-01-15: 12.5 g via INTRAVENOUS
  Filled 2015-01-15: qty 250

## 2015-01-15 MED ORDER — TRAMADOL HCL 50 MG PO TABS
50.0000 mg | ORAL_TABLET | Freq: Two times a day (BID) | ORAL | Status: DC | PRN
  Administered 2015-01-15 – 2015-01-18 (×3): 50 mg via ORAL
  Filled 2015-01-15 (×3): qty 1

## 2015-01-15 MED ORDER — ASPIRIN EC 81 MG PO TBEC
81.0000 mg | DELAYED_RELEASE_TABLET | Freq: Every day | ORAL | Status: DC
  Administered 2015-01-16 – 2015-01-18 (×3): 81 mg via ORAL
  Filled 2015-01-15 (×3): qty 1

## 2015-01-15 MED ORDER — FENTANYL CITRATE 0.05 MG/ML IJ SOLN
25.0000 ug | INTRAMUSCULAR | Status: DC | PRN
  Administered 2015-01-15: 25 ug via INTRAVENOUS

## 2015-01-15 MED ORDER — ALBUMIN HUMAN 5 % IV SOLN
12.5000 g | Freq: Once | INTRAVENOUS | Status: AC
  Administered 2015-01-15: 12.5 g via INTRAVENOUS

## 2015-01-15 MED ORDER — FENTANYL CITRATE 0.05 MG/ML IJ SOLN
INTRAMUSCULAR | Status: AC
  Filled 2015-01-15: qty 2

## 2015-01-15 MED ORDER — TRAMADOL HCL 50 MG PO TABS
ORAL_TABLET | ORAL | Status: AC
  Administered 2015-01-15: 100 mg via ORAL
  Filled 2015-01-15: qty 2

## 2015-01-15 MED ORDER — AMIODARONE HCL IN DEXTROSE 360-4.14 MG/200ML-% IV SOLN
30.0000 mg/h | INTRAVENOUS | Status: DC
  Administered 2015-01-15 – 2015-01-16 (×3): 30 mg/h via INTRAVENOUS
  Administered 2015-01-17: 60 mg/h via INTRAVENOUS
  Administered 2015-01-17 – 2015-01-18 (×3): 30 mg/h via INTRAVENOUS
  Filled 2015-01-15 (×14): qty 200

## 2015-01-15 NOTE — Progress Notes (Addendum)
TCTS DAILY ICU PROGRESS NOTE                   [address]            [place] [address]          [phone]   2 Days Post-Op Procedure(s) (LRB): CORONARY ARTERY BYPASS GRAFTING (CABG) times four, using left internal mammary artery and left greater saphenous vein. (N/A) TRANSESOPHAGEAL ECHOCARDIOGRAM (TEE) (N/A)  Total Length of Stay:  LOS: 5 days   Subjective: Patient passed flatus. He states his abdomen feels tight, but denies nausea or emesis.  Objective: Vital signs in last 24 hours: Temp:  [97.8 F (36.6 C)-99.9 F (37.7 C)] 99.9 F (37.7 C) (02/10 0721) Pulse Rate:  [78-110] 110 (02/10 0700) Cardiac Rhythm:  [-] Normal sinus rhythm (02/10 0600) Resp:  [17-29] 22 (02/10 0700) BP: (90-130)/(50-84) 130/50 mmHg (02/10 0700) SpO2:  [89 %-100 %] 93 % (02/10 0700) Weight:  [203 lb 4.8 oz (92.216 kg)] 203 lb 4.8 oz (92.216 kg) (02/10 0500)  Filed Weights   01/13/15 0421 01/14/15 0500 01/15/15 0500  Weight: 195 lb 3.2 oz (88.542 kg) 201 lb 15.1 oz (91.6 kg) 203 lb 4.8 oz (92.216 kg)    Weight change: 1 lb 5.7 oz (0.616 kg)      Intake/Output from previous day: 02/09 0701 - 02/10 0700 In: 569.7 [P.O.:100; I.V.:339.7; IV Piggyback:100] Out: 945 [Urine:635; Chest Tube:310]     Current Meds: Scheduled Meds: . acetaminophen  1,000 mg Oral 4 times per day   Or  . acetaminophen (TYLENOL) oral liquid 160 mg/5 mL  1,000 mg Per Tube 4 times per day  . allopurinol  100 mg Oral Daily  . antiseptic oral rinse  7 mL Mouth Rinse BID  . aspirin EC  325 mg Oral Daily   Or  . aspirin  324 mg Per Tube Daily  . atorvastatin  80 mg Oral q1800  . bisacodyl  10 mg Oral Daily   Or  . bisacodyl  10 mg Rectal Daily  . docusate sodium  200 mg Oral Daily  . furosemide  20 mg Intravenous Daily  . insulin aspart  0-24 Units Subcutaneous 6 times per day  . insulin detemir  12 Units Subcutaneous Daily  . metoCLOPramide (REGLAN) injection  10 mg Intravenous 4 times per  day  . pantoprazole  40 mg Oral Daily  . predniSONE  10 mg Oral QAC breakfast  . sodium chloride  3 mL Intravenous Q12H   Continuous Infusions: . sodium chloride 10 mL/hr at 01/14/15 0900  . sodium chloride    . sodium chloride 20 mL/hr at 01/13/15 2000  . DOPamine 3 mcg/kg/min (01/15/15 0400)  . lactated ringers Stopped (01/14/15 0900)  . phenylephrine (NEO-SYNEPHRINE) Adult infusion Stopped (01/13/15 1435)   PRN Meds:.bisacodyl, metoprolol, morphine injection, ondansetron (ZOFRAN) IV, oxyCODONE, sodium chloride, traMADol  General appearance: alert, cooperative and no distress Neurologic: intact Heart: RRR, rub with CTs in place Lungs: Diminished at bases Abdomen: Semi soft, distended, a few bowel sounds, non tender Extremities: RLE with SCD in place;mild LE edema Wounds: Aquacel in place on sternum;LLE dressings are clean and dry  Lab Results: CBC:  Recent Labs  01/14/15 1420 01/14/15 1620 01/15/15 0400  WBC 40.1*  --  42.5*  HGB 10.3* 11.2* 10.0*  HCT 30.9* 33.0* 30.6*  PLT 127*  --  71*   BMET:   Recent Labs  01/14/15 0415 01/14/15 1620 01/14/15 1725 01/15/15  0449  NA 139 137  --  135  K 4.2 4.7  --  4.9  CL 107 100  --  100  CO2 25  --   --  24  GLUCOSE 104* 149*  --  138*  BUN 22 28*  --  38*  CREATININE 0.84 1.60* 1.80* 2.61*  CALCIUM 8.3*  --   --  8.3*    PT/INR:   Recent Labs  01/13/15 1427  LABPROT 18.1*  INR 1.48   Radiology: Dg Chest Port 1 View  01/14/2015   CLINICAL DATA:  Coronary artery disease.  Status post CABG.  EXAM: PORTABLE CHEST - 1 VIEW  COMPARISON:  01/13/2015  FINDINGS: The endotracheal tube and NG tube have been removed. Swan-Ganz catheter and chest tubes remain in place, unchanged.  No pneumothorax.  No effusions.  No infiltrates.  IMPRESSION: No acute abnormalities.  No pneumothorax.   Electronically Signed   By: Lorriane Shire M.D.   On: 01/14/2015 07:42     Assessment/Plan: S/P Procedure(s) (LRB): CORONARY ARTERY  BYPASS GRAFTING (CABG) times four, using left internal mammary artery and left greater saphenous vein. (N/A) TRANSESOPHAGEAL ECHOCARDIOGRAM (TEE) (N/A)  1. CV- SR in the 80's this am. Lopressor held last night. Will re order with parameters this am as he is slightly tachy (on Dopamine drip) 2. Pulmonary-On 6 liters of oxygen via [place]. Chest tubes with 310 cc since surgery. Chest tubes to be removed. CXR shows no pneumothorax, cardiomegaly, bibasilar atelectasis, and gastric distention. Encourage incentive spirometer.   3. Volume Overload-no Lasix today as creatinine increased 4. ABL anemia-H and H stable at 10 and 30.6 5. GI-KUB shows mild to moderate gaseous distention of the small bowel. Likely post op ileus but monitor closely. Gastric tube to be placed per Dr. Prescott Gum. NPO except few ice chips. Reglan ordered. 6. CBGs 132/11/104. On Insulin drip. Pre op HGA1C 5.5. He is not a diabetic so will stop accu checks and SS after transfer to PCTU/2000. 7. Thrombocytopneia-platelets 71,000. Patient with history of myelodysplasia and chronically low platelet count. 8. Acute post op renal insufficiency-Creatinine up to 2.61. On Dopamine drip. Foley to remain for close UO monitoring.   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

## 2015-01-15 NOTE — Op Note (Signed)
NAME:  Eric [name], Eric [name] NO.:  [id number]  MEDICAL RECORD NO.:  [id number]  LOCATION:  2S02C                        FACILITY:  [facility]  PHYSICIAN:  Ivin Poot, M.D.  DATE OF BIRTH:  [birth year]  DATE OF PROCEDURE:  01/13/2015 DATE OF DISCHARGE:                              OPERATIVE REPORT   OPERATION: 1. Coronary artery bypass grafting x4 (left internal mammary artery to     LAD, saphenous vein graft to diagonal, saphenous vein graft to     obtuse marginal, saphenous vein graft to posterior descending). 2. Endoscopic harvest of left leg greater saphenous vein.  PREOPERATIVE DIAGNOSIS:  Severe three-vessel coronary artery disease with unstable angina.  POSTOPERATIVE DIAGNOSIS:  Severe three-vessel coronary artery disease with unstable angina.  ANESTHESIA:  General.  INDICATIONS:  The patient is a 69 year old gentleman with chronic anemia from myelodysplasia and chronic thrombocytopenia.  He was admitted to the hospital with symptoms of unstable angina.  Dr. Irish Lack performed cardiac catheterization which demonstrated severe multivessel coronary artery disease.  Left ventricular systolic function was fairly well preserved.  The patient was felt to be a candidate for surgical coronary revascularization.  I examined the patient and reviewed results of the cardiac catheterizations and echocardiogram with the patient and his family.  I discussed the indications, expected benefits of coronary artery bypass grafting for treatment of his coronary artery disease. Reviewed the alternatives to surgical therapy as well.  The patient understood that he was not felt to be a candidate for percutaneous intervention despite his severe thrombocytopenia because he would need double antiplatelet agents for a drug eluting stent and that would place him at increased risk for bleeding.  He understood that there were risks of surgery including the risks of bleeding, and that he  would be expected to require blood transfusion products during or after the surgery.  I reviewed with the patient the other risks to him including risk of stroke, MI, infection, postoperative pulmonary problems including pleural effusion, and death.  After reviewing these issues, he demonstrated his understanding and agreed to proceed with surgery under what I felt was an informed consent.  FINDINGS:  Right leg vein was scarred and thickened and not usable. Left leg vein was slightly thickened but adequate conduit.  The left internal mammary artery was a good conduit.  Vessels were severely diseased but adequate targets.  The patient required both platelet and packed cell transfusion during surgery.  OPERATIVE PROCEDURE:  The patient was brought to the operating room and placed supine on the operating table.  General anesthesia was induced under invasive hemodynamic monitoring.  A transesophageal echo probe was placed by the anesthesiologist.  The patient was prepped and draped as a sterile field.  A proper time-out was performed.  A sternal incision was made as the saphenous vein was harvested endoscopically from the left leg.  The left internal mammary artery was harvested as a pedicle graft from its origin at the subclavian vessels.  It is clear that the patient had a coagulopathy as there was significant bleeding in the process of performing the sternotomy incision and harvesting the mammary artery graft.  The sternal retractor was placed.  The pericardium  was opened and suspended.  Pursestrings were placed in the ascending aorta and right atrium and heparin was administered.  When the ACT was documented as being therapeutic, the patient was cannulated and placed on cardiopulmonary bypass.  The coronary arteries were identified for grafting and the mammary artery and vein grafts were prepared for the distal anastomoses.  Cardioplegia cannulas were placed for both antegrade and  retrograde cold blood cardioplegia and the patient was cooled to 32 degrees.  Aortic crossclamp was applied.  1 L of cold blood cardioplegia was delivered in split doses between the antegrade aortic and retrograde coronary sinus catheters.  There was good cardioplegic arrest and septal temperature dropped less than 15 degrees.  Cardioplegia was delivered every 20 minutes or less while the crossclamp was in place.  The distal coronary anastomoses were performed.  The first distal anastomosis was the posterior descending branch of the right coronary. This had a proximal 90% stenosis.  A reverse saphenous vein was sewn end- to-side with running 7-0 Prolene with good flow through graft. Cardioplegia was redosed.  The second distal anastomosis was to the OM branch of the circumflex. This was a 1.5-mm vessel proximal 80% stenosis.  A reverse saphenous vein was sewn end-to-side with running 7-0 Prolene with good flow through the graft.  Cardioplegia was redosed.  The third distal anastomosis was to a large 1st diagonal branch of the LAD.  This had a proximal 80% stenosis.  A reverse saphenous vein was sewn end-to-side with running 7-0 Prolene with good flow through the graft.  Cardioplegia was redosed.  The fourth distal anastomosis was to the distal third of the LAD.  The LAD is a smaller vessel about 1.5-mm in diameter.  The left IMA pedicle was brought through an opening in the left lateral pericardium, was brought down onto the LAD and sewn end-to-side with running 8-0 Prolene. There was good flow through the anastomosis after briefly releasing the pedicle bulldog on the mammary pedicle.  The bulldog was reapplied and the pedicle was secured to the epicardium with 6-0 Prolene. Cardioplegia was redosed.  While the crossclamp was placed, three proximal vein anastomoses were performed on the ascending aorta with a 4.5 mm punch and running 6-0 Prolene.  Prior to tying down the final  proximal anastomosis, air was vented from the coronaries with a dose of retrograde warm blood cardioplegia.  The crossclamp was removed.  The heart was cardioverted back to a regular rhythm.  The vein grafts were de-aired and opened.  Each had good flow and hemostasis was documented at the proximal distal anastomoses.  The patient was rewarmed and reperfused.  Temporary pacing wires were applied.  The lungs were expanded.  The ventilator was resumed.  When the patient was adequately rewarmed, he was weaned from cardiopulmonary bypass without difficulty requiring inotropes.  Hemodynamics were stable.  Echo showed good LV function.  Protamine was administered without adverse reaction.  There is still considerable coagulopathy.  The platelet count returned low at 50,000.  The patient was given platelet transfusion and FFP with improved coagulation function.  The superior pericardium was closed over the aorta and the vein grafts.  An anterior mediastinal and left pleural chest tube were placed and brought out through separate incisions.  The sternum was closed with interrupted steel wire.  The pectoralis fascia was closed in running #1 Vicryl.  The subcutaneous and skin layers were closed in running Vicryl and sterile dressings were applied.  Total cardiopulmonary bypass time was 130  minutes.     Ivin Poot, M.D.     PV/MEDQ  D:  01/14/2015  T:  01/15/2015  Job:  [id number]  cc:   Lilian Coma, MD Jettie Booze, MD

## 2015-01-15 NOTE — Progress Notes (Signed)
TCTS BRIEF SICU PROGRESS NOTE  2 Days Post-Op  S/P Procedure(s) (LRB): CORONARY ARTERY BYPASS GRAFTING (CABG) times four, using left internal mammary artery and left greater saphenous vein. (N/A) TRANSESOPHAGEAL ECHOCARDIOGRAM (TEE) (N/A)   Stable day Patient states stomach feels much improved NSR w/ stable BP Remains oliguric and creatinine up to 3.6  Plan: Continue current plan  OWEN,CLARENCE H 01/15/2015 6:47 PM

## 2015-01-16 ENCOUNTER — Other Ambulatory Visit: Payer: Medicare Other

## 2015-01-16 ENCOUNTER — Inpatient Hospital Stay (HOSPITAL_COMMUNITY): Payer: Medicare Other

## 2015-01-16 ENCOUNTER — Ambulatory Visit: Payer: Medicare Other

## 2015-01-16 LAB — GLUCOSE, CAPILLARY
GLUCOSE-CAPILLARY: 125 mg/dL — AB (ref 70–99)
GLUCOSE-CAPILLARY: 128 mg/dL — AB (ref 70–99)
GLUCOSE-CAPILLARY: 159 mg/dL — AB (ref 70–99)
GLUCOSE-CAPILLARY: 161 mg/dL — AB (ref 70–99)
GLUCOSE-CAPILLARY: 175 mg/dL — AB (ref 70–99)
GLUCOSE-CAPILLARY: 99 mg/dL (ref 70–99)

## 2015-01-16 LAB — TYPE AND SCREEN
ABO/RH(D): O POS
Antibody Screen: NEGATIVE
Unit division: 0
Unit division: 0
Unit division: 0
Unit division: 0
Unit division: 0
Unit division: 0
Unit division: 0

## 2015-01-16 LAB — BASIC METABOLIC PANEL
Anion gap: 11 (ref 5–15)
BUN: 57 mg/dL — ABNORMAL HIGH (ref 6–23)
CO2: 23 mmol/L (ref 19–32)
Calcium: 7.7 mg/dL — ABNORMAL LOW (ref 8.4–10.5)
Chloride: 95 mmol/L — ABNORMAL LOW (ref 96–112)
Creatinine, Ser: 3.08 mg/dL — ABNORMAL HIGH (ref 0.50–1.35)
GFR calc Af Amer: 22 mL/min — ABNORMAL LOW (ref >90)
GFR calc non Af Amer: 19 mL/min — ABNORMAL LOW (ref >90)
Glucose, Bld: 169 mg/dL — ABNORMAL HIGH (ref 70–99)
Potassium: 4.6 mmol/L (ref 3.5–5.1)
Sodium: 129 mmol/L — ABNORMAL LOW (ref 135–145)

## 2015-01-16 LAB — COMPREHENSIVE METABOLIC PANEL
ALT: 22 U/L (ref 0–53)
AST: 26 U/L (ref 0–37)
Albumin: 3.2 g/dL — ABNORMAL LOW (ref 3.5–5.2)
Alkaline Phosphatase: 53 U/L (ref 39–117)
Anion gap: 10 (ref 5–15)
BUN: 50 mg/dL — ABNORMAL HIGH (ref 6–23)
CO2: 24 mmol/L (ref 19–32)
Calcium: 8 mg/dL — ABNORMAL LOW (ref 8.4–10.5)
Chloride: 100 mmol/L (ref 96–112)
Creatinine, Ser: 3.53 mg/dL — ABNORMAL HIGH (ref 0.50–1.35)
GFR calc Af Amer: 19 mL/min — ABNORMAL LOW (ref >90)
GFR calc non Af Amer: 16 mL/min — ABNORMAL LOW (ref >90)
Glucose, Bld: 124 mg/dL — ABNORMAL HIGH (ref 70–99)
Potassium: 4.4 mmol/L (ref 3.5–5.1)
Sodium: 134 mmol/L — ABNORMAL LOW (ref 135–145)
Total Bilirubin: 1.3 mg/dL — ABNORMAL HIGH (ref 0.3–1.2)
Total Protein: 6.6 g/dL (ref 6.0–8.3)

## 2015-01-16 LAB — CBC
HCT: 26.8 % — ABNORMAL LOW (ref 39.0–52.0)
Hemoglobin: 8.9 g/dL — ABNORMAL LOW (ref 13.0–17.0)
MCH: 27.2 pg (ref 26.0–34.0)
MCHC: 33.2 g/dL (ref 30.0–36.0)
MCV: 82 fL (ref 78.0–100.0)
Platelets: 82 10*3/uL — ABNORMAL LOW (ref 150–400)
RBC: 3.27 MIL/uL — ABNORMAL LOW (ref 4.22–5.81)
RDW: 26 % — ABNORMAL HIGH (ref 11.5–15.5)
WBC: 29.7 10*3/uL — ABNORMAL HIGH (ref 4.0–10.5)

## 2015-01-16 MED ORDER — FENTANYL CITRATE 0.05 MG/ML IJ SOLN
25.0000 ug | INTRAMUSCULAR | Status: DC | PRN
  Administered 2015-01-16 (×3): 25 ug via INTRAVENOUS
  Filled 2015-01-16 (×3): qty 2

## 2015-01-16 MED ORDER — FUROSEMIDE 10 MG/ML IJ SOLN
40.0000 mg | Freq: Once | INTRAMUSCULAR | Status: AC
  Administered 2015-01-16: 40 mg via INTRAVENOUS

## 2015-01-16 MED ORDER — CEFTAZIDIME 1 G IJ SOLR
1.0000 g | Freq: Two times a day (BID) | INTRAMUSCULAR | Status: DC
  Filled 2015-01-16 (×2): qty 1

## 2015-01-16 MED ORDER — FUROSEMIDE 10 MG/ML IJ SOLN
INTRAMUSCULAR | Status: AC
  Administered 2015-01-16: 40 mg via INTRAVENOUS
  Filled 2015-01-16: qty 4

## 2015-01-16 MED ORDER — DEXTROSE 5 % IV SOLN
1.0000 g | Freq: Two times a day (BID) | INTRAVENOUS | Status: DC
  Administered 2015-01-16 – 2015-01-19 (×8): 1 g via INTRAVENOUS
  Filled 2015-01-16 (×10): qty 1

## 2015-01-16 NOTE — Progress Notes (Addendum)
[facility] DAILY ICU PROGRESS NOTE                   [address]            [place] [address]          [id number]   3 Days Post-Op Procedure(s) (LRB): CORONARY ARTERY BYPASS GRAFTING (CABG) times four, using left internal mammary artery and left greater saphenous vein. (N/A) TRANSESOPHAGEAL ECHOCARDIOGRAM (TEE) (N/A)  Total Length of Stay:  LOS: 6 days   Subjective: Patient fatigued. Passed a little flatus and small,loose bowel movement. Denies abdominal pain,nausea or vomiting.   Objective: Vital signs in last 24 hours: Temp:  [98.1 F (36.7 C)-99 F (37.2 C)] 98.1 F (36.7 C) (02/11 0340) Pulse Rate:  [81-137] 94 (02/11 0700) Cardiac Rhythm:  [-] Atrial fibrillation (02/11 0550) Resp:  [13-29] 24 (02/11 0700) BP: (88-143)/(40-100) 109/55 mmHg (02/11 0700) SpO2:  [85 %-97 %] 95 % (02/11 0700) Weight:  [204 lb 12.9 oz (92.9 kg)] 204 lb 12.9 oz (92.9 kg) (02/11 0600)  Filed Weights   01/14/15 0500 01/15/15 0500 01/16/15 0600  Weight: 201 lb 15.1 oz (91.6 kg) 203 lb 4.8 oz (92.216 kg) 204 lb 12.9 oz (92.9 kg)    Weight change: 1 lb 8.1 oz (0.684 kg)      Intake/Output from previous day: 02/10 0701 - 02/11 0700 In: 1117.7 [I.V.:837.7; NG/GT:30; IV Piggyback:250] Out: 511 [Urine:460; Emesis/NG output:50; Stool:1]     Current Meds: Scheduled Meds: . acetaminophen  1,000 mg Oral 4 times per day   Or  . acetaminophen (TYLENOL) oral liquid 160 mg/5 mL  1,000 mg Per Tube 4 times per day  . allopurinol  100 mg Oral Daily  . antiseptic oral rinse  7 mL Mouth Rinse BID  . aspirin EC  81 mg Oral Daily  . atorvastatin  80 mg Oral q1800  . bisacodyl  10 mg Oral Daily   Or  . bisacodyl  10 mg Rectal Daily  . docusate sodium  200 mg Oral Daily  . insulin aspart  0-24 Units Subcutaneous 6 times per day  . metoCLOPramide (REGLAN) injection  10 mg Intravenous 4 times per day  . predniSONE  10 mg Oral QAC breakfast  . sodium chloride  3 mL Intravenous Q12H    Continuous Infusions: . sodium chloride Stopped (01/15/15 1200)  . sodium chloride    . sodium chloride 20 mL/hr at 01/13/15 2000  . amiodarone 30 mg/hr (01/16/15 0700)  . DOPamine 3 mcg/kg/min (01/16/15 0700)  . phenylephrine (NEO-SYNEPHRINE) Adult infusion Stopped (01/13/15 1435)   PRN Meds:.bisacodyl, fentaNYL, metoprolol, ondansetron (ZOFRAN) IV, oxyCODONE, sodium chloride, traMADol  General appearance: alert, cooperative and no distress Neurologic: intact Heart: IRRR IRRR Lungs: Diminished at bases L>R Abdomen: Semi soft, distended, a few high pitched bowel sounds, non tender Extremities: Mild LE edema Wounds: Aquacel in place on sternum;LLE wound is clean and dry  Lab Results: CBC:  Recent Labs  01/15/15 1700 01/16/15 0420  WBC 40.8* 29.7*  HGB 9.0* 8.9*  HCT 27.4* 26.8*  PLT 90* 82*   BMET:   Recent Labs  01/15/15 1700 01/16/15 0420  NA 132* 134*  K 4.6 4.4  CL 98 100  CO2 26 24  GLUCOSE 146* 124*  BUN 43* 50*  CREATININE 3.61* 3.53*  CALCIUM 7.9* 8.0*    PT/INR:   Recent Labs  01/13/15 1427  LABPROT 18.1*  INR 1.48   Radiology:  EXAM: PORTABLE CHEST -  1 VIEW  COMPARISON: 01/15/2015 and 01/14/2015 and 01/10/2015  FINDINGS: There is a new small left pleural effusion with minimal atelectasis at the left base. Overall heart size and pulmonary vascularity are normal. Hypoaeration at the right base due to a shallow inspiration.  Central venous sheath is seen in the right jugular vein. Chest tubes have been removed.  IMPRESSION: New small left effusion. No pneumothorax. Slight increased atelectasis at the left base.  PORTABLE ABDOMEN - 1 VIEW  COMPARISON: 01/15/2015  FINDINGS: There are minimally distended loops of small bowel, decreased since the prior study. Minimal air in the ascending colon. It is also small amount of air in the stomach. No visible free air. No significant osseous abnormality.  IMPRESSION: Slight improvement  in ileus.  Assessment/Plan: S/P Procedure(s) (LRB): CORONARY ARTERY BYPASS GRAFTING (CABG) times four, using left internal mammary artery and left greater saphenous vein. (N/A) TRANSESOPHAGEAL ECHOCARDIOGRAM (TEE) (N/A)  1. CV- A fib with RVR yesterday. On Amiodarone drip. 2. Pulmonary-On 2 liters of oxygen via [place]. CXR appears to show no pneumothorax, cardiomegaly, low lung volumes,bibasilar atelectasis L>R, left pleural effusion,and mild gastric distention. Encourage incentive spirometer.  3. Volume Overload-no Lasix today as creatinine increased 4. ABL anemia-H and H stable at 8.9 and 26.8. Has MDS 5. GI-KUB shows gaseous distention of the small bowel, no worse than prior. Likely post op ileus but monitor closely. NPO except few ice chips and meds. On Reglan 6. CBGs 115/101/99. Pre op HGA1C 5.5. He is not a diabetic so will stop accu checks and SS after transfer to [facility]/2000. 7. Thrombocytopneia-platelets 82,000. Patient with history of myelodysplasia and chronically low platelet count. 8. Acute post op renal insufficiency-Creatinine 3.53. On Dopamine drip. Foley to remain for close UO monitoring.   ZIMMERMAN,DONIELLE M PA-C 01/16/2015 7:50 AM  Post op acute renal failure- creat better this am Postop afib on iv amio Postop ileus- better- + BNM start po liqs Postop increased WBC > 30 k with hx chronic steroids, cover poss L    pneumonia with Tressie Ellis Pre/post op anemia  Keep in ICU on renal dop and iv amio Leave foley to monitor u/o  patient examined and medical record reviewed,agree with above note. VAN TRIGT III,PETER 01/16/2015

## 2015-01-16 NOTE — Progress Notes (Signed)
CT surgery p.m. Rounds  Status post CABG Remains in rate controlled atrial fibrillation on IV amiodarone Postoperative acute renal insufficiency-urine output slightly improved as creatinine is decreasing, will dose with Lasix this p.m. to help oxygenation Postop ileus improved-continue IV Reglan

## 2015-01-17 ENCOUNTER — Inpatient Hospital Stay (HOSPITAL_COMMUNITY): Payer: Medicare Other

## 2015-01-17 LAB — COMPREHENSIVE METABOLIC PANEL
ALT: 22 U/L (ref 0–53)
AST: 28 U/L (ref 0–37)
Albumin: 3.2 g/dL — ABNORMAL LOW (ref 3.5–5.2)
Alkaline Phosphatase: 59 U/L (ref 39–117)
Anion gap: 13 (ref 5–15)
BUN: 53 mg/dL — ABNORMAL HIGH (ref 6–23)
CO2: 23 mmol/L (ref 19–32)
Calcium: 8.5 mg/dL (ref 8.4–10.5)
Chloride: 96 mmol/L (ref 96–112)
Creatinine, Ser: 2.17 mg/dL — ABNORMAL HIGH (ref 0.50–1.35)
GFR calc Af Amer: 34 mL/min — ABNORMAL LOW (ref >90)
GFR calc non Af Amer: 30 mL/min — ABNORMAL LOW (ref >90)
Glucose, Bld: 135 mg/dL — ABNORMAL HIGH (ref 70–99)
Potassium: 4.1 mmol/L (ref 3.5–5.1)
Sodium: 132 mmol/L — ABNORMAL LOW (ref 135–145)
Total Bilirubin: 1.1 mg/dL (ref 0.3–1.2)
Total Protein: 7 g/dL (ref 6.0–8.3)

## 2015-01-17 LAB — CBC
HCT: 28.1 % — ABNORMAL LOW (ref 39.0–52.0)
Hemoglobin: 9.2 g/dL — ABNORMAL LOW (ref 13.0–17.0)
MCH: 26.4 pg (ref 26.0–34.0)
MCHC: 32.7 g/dL (ref 30.0–36.0)
MCV: 80.7 fL (ref 78.0–100.0)
Platelets: 102 10*3/uL — ABNORMAL LOW (ref 150–400)
RBC: 3.48 MIL/uL — ABNORMAL LOW (ref 4.22–5.81)
RDW: 26.4 % — ABNORMAL HIGH (ref 11.5–15.5)
WBC: 20.3 10*3/uL — ABNORMAL HIGH (ref 4.0–10.5)

## 2015-01-17 LAB — GLUCOSE, CAPILLARY
GLUCOSE-CAPILLARY: 149 mg/dL — AB (ref 70–99)
Glucose-Capillary: 122 mg/dL — ABNORMAL HIGH (ref 70–99)
Glucose-Capillary: 121 mg/dL — ABNORMAL HIGH (ref 70–99)
Glucose-Capillary: 147 mg/dL — ABNORMAL HIGH (ref 70–99)
Glucose-Capillary: 129 mg/dL — ABNORMAL HIGH (ref 70–99)

## 2015-01-17 MED ORDER — DM-GUAIFENESIN ER 30-600 MG PO TB12
1.0000 | ORAL_TABLET | Freq: Two times a day (BID) | ORAL | Status: DC | PRN
  Administered 2015-01-19: 1 via ORAL
  Filled 2015-01-17 (×2): qty 1

## 2015-01-17 MED ORDER — FUROSEMIDE 10 MG/ML IJ SOLN
40.0000 mg | Freq: Every day | INTRAMUSCULAR | Status: DC
Start: 2015-01-17 — End: 2015-01-18
  Administered 2015-01-17 – 2015-01-18 (×2): 40 mg via INTRAVENOUS
  Filled 2015-01-17 (×2): qty 4

## 2015-01-17 MED ORDER — ALPRAZOLAM 0.5 MG PO TABS
0.5000 mg | ORAL_TABLET | Freq: Two times a day (BID) | ORAL | Status: DC | PRN
  Administered 2015-01-17 – 2015-01-22 (×7): 0.5 mg via ORAL
  Filled 2015-01-17 (×7): qty 1

## 2015-01-17 NOTE — Progress Notes (Signed)
Patient ID: Eric [name], male   DOB: 1946-10-16, 69 y.o.   MRN: [id number]  SICU Evening Rounds:  Hemodynamically stable  A-fib 90's on amiodarone.  Dopamine off  Urine ouput ok

## 2015-01-17 NOTE — Discharge Instructions (Signed)
Order for sheath removal verified per post procedural orders. Procedure explained to patient and Rt Radial artery access site assessed: level 0, Positive reverse allens performed pulses. 6Fr. [place] Sheath removed and manual pressure applied for 75 minutes minutes. Pre, peri, & post procedural vitals: HR 57 RR 16, O2 Sat upper 145 / 50, BP 145/50, Pain 0. pulses remained intact after sheath removal. Access site level 0 and dressed with 4X4 gauze and tegaderm.  Telemetry, RN confirmed condition of site. Post procedural instructions discussed with return demonstration from patient.    Activity: 1.May walk up steps                2.No lifting more than ten pounds for four weeks.                 3.No driving for four weeks.                4.Stop any activity that causes chest pain, shortness of breath, dizziness, sweating or excessive weakness.                5.Avoid straining.                6.Continue with your breathing exercises daily.  Diet: Low fat, Lowsalt diet  Wound Care: May shower.  Clean wounds with mild soap and water daily. Contact the office at [phone] if any problems arise.  Coronary Artery Bypass Grafting, Care After Refer to this sheet in the next few weeks. These instructions provide you with information on caring for yourself after your procedure. Your health care provider may also give you more specific instructions. Your treatment has been planned according to current medical practices, but problems sometimes occur. Call your health care provider if you have any problems or questions after your procedure. WHAT TO EXPECT AFTER THE PROCEDURE Recovery from surgery will be different for everyone. Some people feel well after 3 or 4 weeks, while for others it takes longer. After your procedure, it is typical to have the following:  Nausea and a lack of appetite.   Constipation.  Weakness and fatigue.   Depression or irritability.   Pain or discomfort at your incision  site. HOME CARE INSTRUCTIONS  Take medicines only as directed by your health care provider. Do not stop taking medicines or start any new medicines without first checking with your health care provider.  Take your pulse as directed by your health care provider.  Perform deep breathing as directed by your health care provider. If you were given a device called an incentive spirometer, use it to practice deep breathing several times a day. Support your chest with a pillow or your arms when you take deep breaths or cough.  Keep incision areas clean, dry, and protected. Remove or change any bandages (dressings) only as directed by your health care provider. You may have skin adhesive strips over the incision areas. Do not take the strips off. They will fall off on their own.  Check incision areas daily for any swelling, redness, or drainage.  If incisions were made in your legs, do the following:  Avoid crossing your legs.   Avoid sitting for long periods of time. Change positions every 30 minutes.   Elevate your legs when you are sitting.  Wear compression stockings as directed by your health care provider. These stockings help keep blood clots from forming in your legs.  Take showers once your health care provider approves. Until then, only take sponge  baths. Pat incisions dry. Do not rub incisions with a washcloth or towel. Do not take baths, swim, or use a hot tub until your health care provider approves.  Eat foods that are high in fiber, such as raw fruits and vegetables, whole grains, beans, and nuts. Meats should be lean cut. Avoid canned, processed, and fried foods.  Drink enough fluid to keep your urine clear or pale yellow.  Weigh yourself every day. This helps identify if you are retaining fluid that may make your heart and lungs work harder.  Rest and limit activity as directed by your health care provider. You may be instructed to:  Stop any activity at once if you have  chest pain, shortness of breath, irregular heartbeats, or dizziness. Get help right away if you have any of these symptoms.  Move around frequently for short periods or take short walks as directed by your health care provider. Increase your activities gradually. You may need physical therapy or cardiac rehabilitation to help strengthen your muscles and build your endurance.  Avoid lifting, pushing, or pulling anything heavier than 10 lb (4.5 kg) for at least 6 weeks after surgery.  Do not drive until your health care provider approves.  Ask your health care provider when you may return to work.  Ask your health care provider when you may resume sexual activity.  Keep all follow-up visits as directed by your health care provider. This is important. SEEK MEDICAL CARE IF:  You have swelling, redness, increasing pain, or drainage at the site of an incision.  You have a fever.  You have swelling in your ankles or legs.  You have pain in your legs.   You gain 2 or more pounds (0.9 kg) a day.  You are nauseous or vomit.  You have diarrhea. SEEK IMMEDIATE MEDICAL CARE IF:  You have chest pain that goes to your jaw or arms.  You have shortness of breath.   You have a fast or irregular heartbeat.   You notice a "clicking" in your breastbone (sternum) when you move.   You have numbness or weakness in your arms or legs.  You feel dizzy or light-headed.  MAKE SURE YOU:  Understand these instructions.  Will watch your condition.  Will get help right away if you are not doing well or get worse. Document Released: 06/11/2005 Document Revised: 04/08/2014 Document Reviewed: 05/01/2013 [facility] Patient Information 2015 [place], [place]. This information is not intended to replace advice given to you by your health care provider. Make sure you discuss any questions you have with your health care provider.

## 2015-01-17 NOTE — Progress Notes (Signed)
Utilization Review Completed.  

## 2015-01-17 NOTE — Progress Notes (Signed)
Called Dr. Darcey Nora in the OR to advise him of the AM lab results.  Lab results were not available when he rounded.  No new orders.  Pt will remain in ICU for today.

## 2015-01-17 NOTE — Discharge Summary (Signed)
Physician Discharge Summary       [address]       [place] [address]             [phone]    Patient ID: Eric [name] MRN: [id number] DOB/AGE: [birth year] 69 y.o.  Admit date: 01/09/2015 Discharge date: 01/19/2015  Admission Diagnoses: 1. Multi vessel CAD 2. History of hypertension 3. History of tobacco abuse 4. History of anemia 5. History of MDS  ((myelodysplastic syndrome) 6. History of RA  Discharge Diagnoses:  1. Multi vessel CAD 2. History of hypertension 3. History of tobacco abuse 4. History of anemia 5. History of MDS  ((myelodysplastic syndrome) 6. History of RA 7. Right  prox ICA stenosis (60-79% prox ICA stenosis)  Procedure (s):  1. Coronary catheterization: Left main: 20% ostial stenosis.   Left Anterior Descending Artery: Large caliber vessel that does not reach the apex. The LAD gives off a large diagonal branch. In the mid LAD there is a 99% stenosis followed by a 90% stenosis. The distal vessel is moderate in caliber. The diagonal branch is a large caliber vessel with diffuse proximal 50% stenosis.   Circumflex Artery: Large caliber vessel with 40% proximal stenosis. There is a large caliber obtuse marginal branch with proximal 60-70% stenosis. The obtuse marginal branch bifurcates distally. The superior branch has proximal 80% stenosis.   Right Coronary Artery: Large dominant vessel with diffuse proximal and mid calcification. The mid vessel has serial 50% stenoses. The distal vessel has a 99% stenosis just before the bifurcation into the large caliber posterolateral branch and the large caliber PDA. The PDA has proximal 50% stenosis. The Posterolateral branch has mild diffuse plaque.   Left Ventricular Angiogram: LVEF=50%  Impression: 1. Severe triple vessel CAD 2. Low normal systolic function 3. Unstable angina  2. Coronary artery bypass grafting x4 (left internal mammary artery to LAD, saphenous vein graft to diagonal,  saphenous vein graft to obtuse marginal, saphenous vein graft to posterior descending). 3. Endoscopic harvest of left leg greater saphenous vein by Dr. Prescott Gum on 01/13/2015.   History of Presenting Illness: Patient examined, cardiac catheterization and 2-D echocardiogram personally reviewed by Dr. Prescott Gum. This is a 69 year old Caucasian male who presents to the hospital symptoms of unstable angina. These have been progressive over the past 12 weeks. He has a strong family history of CAD. His lipid panel has been normal. He has a history of remote smoking. He is a nondiabetic.  His past medical history significant for myelodysplasia and chronically low platelet count and anemia the past 4-5 years. He is followed by a hematologist. He receives Epogen injection every 3 weeks. He has not had any significant bleeding episodes for the past several weeks other than a nosebleed 1 week ago. He did not bleed much from his radial artery stick for his cardiac catheterization. The patient had a right total knee replacement 2 years ago without significant bleeding difficulties. His platelet count last week was 80,000. This week in the hospital was platelet count has varied between 35 and 45,000.  The patient has history of rheumatoid arthritis and takes prednisone 10 mg daily. Rheumatoid arthritis is the reason why he had a total knee replacement. 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 The patient has graftable vessels and echocardiogram shows no significant valvular disease. The patient is not felt to be candidate for PCI and surgical coronary revascularization has been recommended. Dr. Prescott Gum discussed the need for coronary  artery bypass grafting surgery. Potential risks, benefits, and complications were discussed and the patient agreed to proceed with surgery. Pre operative caroditd duplex US showed 60-79% right internal carotid artery stenosis and a mild  40-59% left internal carotid artery stenosis. He underwent a CABG x 4 on 01/13/2015.  Brief Hospital Course:  The patient was extubated the evening of surgery without difficulty. He remained afebrile and hemodynamically stable. He was on a dopamine drip, which remained for several days as creatinine was elevated (went as high as 3.61). Gradually, creatinine began to decrease. [name], a line, and chest tubes were removed early in the post operative course.  He developed gastric dilatation. He was made NPO. He was given Reglan and briefly had NGT placed and then removed. Serial KUBs were obtained. He had developed an ileus. Over several days, gastric dilatation and ileus resolved. He did have a bowel movement. He went into a fib with RVR on 2/10. He was placed on Amiodarone. He had both anemia and thrombocytopenia, which are in part, related to his MDS. His last H and H was 7.8 and 24.7  and his last platelet count was 89,000.  Due to this his ASA has been held.  Once his platelet count recovers he can resume his ASA. He was volume over loaded and diuresed, once creatinine began to decrease. His creatinine was down to 1.21 on 2/13. He was weaned off the insulin drip. The patient's HGA1C pre op was 5.5. He was put on Fortaz to cover possibly early PNA. The patient was felt surgically stable for transfer from the ICU to PCTU for further convalescence on 01/18/2015.  He  continues to progress with cardiac rehab. He was ambulating on room air. He has been tolerating a diet and has had multiple bowel movements. Epicardial pacing wires and chest tube sutures will be removed prior to discharge.  The patient has a history of chronic anemia with hemoglobin dropping to 6.6.  He was transfused 2 units of packed cells and placed on Trinsicon. Provided the patient remains afebrile, hemodynamically stable, and pending morning round evaluation, he will be surgically stable for discharge on 01/22/2015.   Latest Vital  Signs: Blood pressure 138/70, pulse 89, temperature 98.2 F (36.8 C), temperature source Oral, resp. rate 18, height 5\' 8"  (1.727 m), weight 199 lb 12.8 oz (90.629 kg), SpO2 97 %.  Physical Exam: Cardiovascular: IRRR IRRR Pulmonary: Clear to auscultation on right and diminished left base; no rales, wheezes, or rhonchi. Abdomen: Soft, non tender, bowel sounds present. Extremities: SCDs in place Wounds: Clean and dry. No erythema or signs of infection.  Discharge Condition:Stable  Recent laboratory studies:  Lab Results  Component Value Date   WBC 7.5 01/19/2015   HGB 7.8* 01/19/2015   HCT 24.7* 01/19/2015   MCV 82.3 01/19/2015   PLT 89* 01/19/2015   Lab Results  Component Value Date   NA 135 01/19/2015   K 4.1 01/19/2015   CL 99 01/19/2015   CO2 27 01/19/2015   CREATININE 1.21 01/19/2015   GLUCOSE 122* 01/19/2015      Diagnostic Studies: Ct Chest Wo Contrast  01/10/2015   CLINICAL DATA:  Chest pain.  EXAM: CT CHEST WITHOUT CONTRAST  TECHNIQUE: Multidetector CT imaging of the chest was performed following the standard protocol without IV contrast.  COMPARISON:  Chest radiograph of same day.  FINDINGS: No pneumothorax or pleural effusion is noted. No acute pulmonary disease is noted. Coronary artery calcifications are noted. No significant mediastinal mass  or adenopathy is noted. No significant osseous abnormality is noted.  3.7 x 2.9 cm complex density is seen in the gallbladder fundus. It is uncertain if this represents large gallstone, or possibly mass.  IMPRESSION: Coronary artery calcifications are noted suggesting coronary artery disease.  No other significant abnormality is noted within the chest.  3.7 cm complex density is seen in the gallbladder fundus. It is uncertain if this represents large gallstone, or possibly mass. Ultrasound is recommended for further evaluation.   Electronically Signed   By: Sabino Dick M.D.   On: 01/10/2015 15:28   EXAM: PORTABLE CHEST - 1  VIEW COMPARISON: 01/17/2015.  FINDINGS: Stable enlarged cardiac and mediastinal contours status post median sternotomy and CABG procedure. Interval removal of right internal jugular sheath. Low lung volumes. Persistent small to moderate left pleural effusion and unchanged left mid and lower lung heterogeneous pulmonary opacities. No definite pneumothorax.  IMPRESSION: No significant interval change left lower lobe atelectasis/ infiltrate and left pleural effusion.   Electronically Signed  By: Lovey Newcomer M.D.  On: 01/18/2015 07:24  EXAM: PORTABLE ABDOMEN - 1 VIEW  COMPARISON: 01/17/2015  FINDINGS: Small bowel loops are less distended today, now under 3 cm in maximal diameter, and there is no appreciable transition point. Colonic diameter is normal. Pacer wires again noted.  IMPRESSION: Normalized bowel gas pattern.   Electronically Signed  By: Monte Fantasia M.D.  On: 01/18/2015 07:25  Discharge Medications:    Medication List    STOP taking these medications        isosorbide mononitrate 30 MG 24 hr tablet  Commonly known as:  IMDUR     lisinopril-hydrochlorothiazide 20-25 MG per tablet  Commonly known as:  PRINZIDE,ZESTORETIC     nitroGLYCERIN 0.4 MG SL tablet  Commonly known as:  NITROSTAT      TAKE these medications        acetaminophen 500 MG tablet  Commonly known as:  TYLENOL  Take 1,000 mg by mouth every 6 (six) hours as needed for pain.     alendronate 70 MG tablet  Commonly known as:  FOSAMAX  Take 70 mg by mouth once a week. On Friday. Take with a full glass of water on an empty stomach.     allopurinol 100 MG tablet  Commonly known as:  ZYLOPRIM  Take 100 mg by mouth daily.     ALPRAZolam 0.25 MG tablet  Commonly known as:  XANAX  Take 0.125 mg by mouth 2 (two) times daily as needed for anxiety.     amiodarone 200 MG tablet  Commonly known as:  PACERONE  Take 1 tablet (200 mg total) by mouth 2 (two) times daily.      aspirin 81 MG tablet  Take 81 mg by mouth daily.     atorvastatin 80 MG tablet  Commonly known as:  LIPITOR  Take 1 tablet (80 mg total) by mouth daily at 6 PM.     CALTRATE 600 PLUS-VIT D PO  Take 1 tablet by mouth 2 (two) times daily.     Darbepoetin Alfa 500 MCG/ML Sosy injection  Commonly known as:  ARANESP  Inject 500 mcg into the skin every 21 ( twenty-one) days.     ferrous [id number] C-folic acid capsule  Commonly known as:  TRINSICON / FOLTRIN  Take 1 capsule by mouth 3 (three) times daily after meals.     Fish Oil 1000 MG Caps  Take 2,000 mg by mouth 2 (two) times daily.  furosemide 40 MG tablet  Commonly known as:  LASIX  Take 1 tablet (40 mg total) by mouth daily. For 3 doses     loratadine 10 MG tablet  Commonly known as:  CLARITIN  Take 10 mg by mouth daily.     metoprolol 50 MG tablet  Commonly known as:  LOPRESSOR  take 1/2 tablet by mouth twice a day     oxyCODONE 5 MG immediate release tablet  Commonly known as:  Oxy IR/ROXICODONE  Take 1-2 tablets (5-10 mg total) by mouth every 3 (three) hours as needed for severe pain.     potassium chloride SA 20 MEQ tablet  Commonly known as:  K-DUR,KLOR-CON  Take 1 tablet (20 mEq total) by mouth 2 (two) times daily. For 3 Days     predniSONE 5 MG tablet  Commonly known as:  DELTASONE  Take 10 mg by mouth daily.     traMADol 50 MG tablet  Commonly known as:  ULTRAM  Take 25 mg by mouth every 4 (four) hours as needed for pain.          The patient has been discharged on:   1.Beta Blocker:  Yes [ x  ]                              No   [   ]                              If No, reason:  2.Ace Inhibitor/ARB: Yes [   ]                                     No  [  x  ]                                     If No, reason: Renal insufficiency  3.Statin:   Yes [ x  ]                  No  [   ]                  If No, reason:  4.Ecasa:  Yes  [  x ]                  No   [   ]                   If No, reason:  Follow Up Appointments: Follow-up Information    Follow up with Richardson Dopp, PA-C On 02/03/2015.   Specialty:  Physician Assistant   Why:  Appointment time is at 11:10 am   Contact information:   [address] [place] [address] [phone]       Follow up with Len Childs, MD On 02/19/2015.   Specialty:  Cardiothoracic Surgery   Why:  Pa/LAT CXR to be taken (at [facility] which is in the same building as Dr. Lucianne Lei Trigt's office) on 02/19/2015 at 9:45 am;Appointment with Dr. Prescott Gum is at 10:45 am   Contact information:   [address] [place] [address] [phone]  SignedLars Pinks MPA-C 01/19/2015, 9:14 AM

## 2015-01-17 NOTE — Progress Notes (Signed)
4 Days Post-Op Procedure(s) (LRB): CORONARY ARTERY BYPASS GRAFTING (CABG) times four, using left internal mammary artery and left greater saphenous vein. (N/A) TRANSESOPHAGEAL ECHOCARDIOGRAM (TEE) (N/A) Subjective: Postop acute renal failure, ileus improved Walked 200 ft persistent afib- will wean dopamine whem creat < 2.0 and cont iv amiodarone Myelodysplasia- Hb stable, plts chronically low, WBC 20 k Objective: Vital signs in last 24 hours: Temp:  [97.9 F (36.6 C)-98.5 F (36.9 C)] 98 F (36.7 C) (02/12 1148) Pulse Rate:  [81-123] 101 (02/12 1000) Cardiac Rhythm:  [-] Atrial fibrillation (02/12 0800) Resp:  [16-27] 20 (02/12 1000) BP: (84-153)/(40-94) 132/69 mmHg (02/12 1000) SpO2:  [89 %-99 %] 97 % (02/12 1000) Weight:  [205 lb 11 oz (93.3 kg)] 205 lb 11 oz (93.3 kg) (02/12 0500)  Hemodynamic parameters for last 24 hours:   nsr Intake/Output from previous day: 02/11 0701 - 02/12 0700 In: 1460.4 [P.O.:840; I.V.:520.4; IV Piggyback:100] Out: 1810 [Urine:1810] Intake/Output this shift: Total I/O In: 160.1 [I.V.:110.1; IV Piggyback:50] Out: 20 [Urine:20]  Lungs clear abd tight , non tender Edema improving  Lab Results:  Recent Labs  01/16/15 0420 01/17/15 0801  WBC 29.7* 20.3*  HGB 8.9* 9.2*  HCT 26.8* 28.1*  PLT 82* 102*   BMET:  Recent Labs  01/16/15 1600 01/17/15 0801  NA 129* 132*  K 4.6 4.1  CL 95* 96  CO2 23 23  GLUCOSE 169* 135*  BUN 57* 53*  CREATININE 3.08* 2.17*  CALCIUM 7.7* 8.5    PT/INR: No results for input(s): LABPROT, INR in the last 72 hours. ABG    Component Value Date/Time   PHART 7.404 01/13/2015 1955   HCO3 24.5* 01/13/2015 1955   TCO2 21 01/14/2015 1620   ACIDBASEDEF 1.0 01/13/2015 1820   O2SAT 98.0 01/13/2015 1955   CBG (last 3)   Recent Labs  01/17/15 0358 01/17/15 0720 01/17/15 1145  GLUCAP 122* 121* 149*    Assessment/Plan: S/P Procedure(s) (LRB): CORONARY ARTERY BYPASS GRAFTING (CABG) times four, using left  internal mammary artery and left greater saphenous vein. (N/A) TRANSESOPHAGEAL ECHOCARDIOGRAM (TEE) (N/A) Wean dopamine DC sleeve   LOS: 7 days    Eric [name],Eric [name] 01/17/2015

## 2015-01-18 ENCOUNTER — Inpatient Hospital Stay (HOSPITAL_COMMUNITY): Payer: Medicare Other

## 2015-01-18 DIAGNOSIS — I480 Paroxysmal atrial fibrillation: Secondary | ICD-10-CM

## 2015-01-18 LAB — CBC
HCT: 22.7 % — ABNORMAL LOW (ref 39.0–52.0)
Hemoglobin: 7.4 g/dL — ABNORMAL LOW (ref 13.0–17.0)
MCH: 26.1 pg (ref 26.0–34.0)
MCHC: 32.6 g/dL (ref 30.0–36.0)
MCV: 80.2 fL (ref 78.0–100.0)
Platelets: 59 10*3/uL — ABNORMAL LOW (ref 150–400)
RBC: 2.83 MIL/uL — ABNORMAL LOW (ref 4.22–5.81)
RDW: 26.1 % — ABNORMAL HIGH (ref 11.5–15.5)
WBC: 11.9 10*3/uL — ABNORMAL HIGH (ref 4.0–10.5)

## 2015-01-18 LAB — COMPREHENSIVE METABOLIC PANEL
ALT: 18 U/L (ref 0–53)
AST: 24 U/L (ref 0–37)
Albumin: 2.8 g/dL — ABNORMAL LOW (ref 3.5–5.2)
Alkaline Phosphatase: 54 U/L (ref 39–117)
Anion gap: 6 (ref 5–15)
BUN: 50 mg/dL — ABNORMAL HIGH (ref 6–23)
CO2: 30 mmol/L (ref 19–32)
Calcium: 8.3 mg/dL — ABNORMAL LOW (ref 8.4–10.5)
Chloride: 97 mmol/L (ref 96–112)
Creatinine, Ser: 1.64 mg/dL — ABNORMAL HIGH (ref 0.50–1.35)
GFR calc Af Amer: 48 mL/min — ABNORMAL LOW (ref >90)
GFR calc non Af Amer: 41 mL/min — ABNORMAL LOW (ref >90)
Glucose, Bld: 113 mg/dL — ABNORMAL HIGH (ref 70–99)
Potassium: 4.1 mmol/L (ref 3.5–5.1)
Sodium: 133 mmol/L — ABNORMAL LOW (ref 135–145)
Total Bilirubin: 0.9 mg/dL (ref 0.3–1.2)
Total Protein: 6.2 g/dL (ref 6.0–8.3)

## 2015-01-18 LAB — GLUCOSE, CAPILLARY
GLUCOSE-CAPILLARY: 108 mg/dL — AB (ref 70–99)
GLUCOSE-CAPILLARY: 148 mg/dL — AB (ref 70–99)
Glucose-Capillary: 130 mg/dL — ABNORMAL HIGH (ref 70–99)
Glucose-Capillary: 143 mg/dL — ABNORMAL HIGH (ref 70–99)
Glucose-Capillary: 152 mg/dL — ABNORMAL HIGH (ref 70–99)
Glucose-Capillary: 97 mg/dL (ref 70–99)

## 2015-01-18 MED ORDER — SODIUM CHLORIDE 0.9 % IV SOLN: 250.0000 mL | INTRAVENOUS | Status: DC | PRN

## 2015-01-18 MED ORDER — ONDANSETRON HCL 4 MG/2ML IJ SOLN
4.0000 mg | Freq: Four times a day (QID) | INTRAMUSCULAR | Status: DC | PRN
  Administered 2015-01-22: 4 mg via INTRAVENOUS
  Filled 2015-01-18: qty 2

## 2015-01-18 MED ORDER — ONDANSETRON HCL 4 MG PO TABS
4.0000 mg | ORAL_TABLET | Freq: Four times a day (QID) | ORAL | Status: DC | PRN
  Administered 2015-01-18 – 2015-01-21 (×2): 4 mg via ORAL
  Filled 2015-01-18 (×2): qty 1

## 2015-01-18 MED ORDER — MOVING RIGHT ALONG BOOK
Freq: Once | Status: AC
  Administered 2015-01-18: 19:00:00
  Filled 2015-01-18: qty 1

## 2015-01-18 MED ORDER — INSULIN ASPART 100 UNIT/ML ~~LOC~~ SOLN: 0.0000 [IU] | Freq: Three times a day (TID) | SUBCUTANEOUS | Status: DC

## 2015-01-18 MED ORDER — TRAMADOL HCL 50 MG PO TABS
50.0000 mg | ORAL_TABLET | ORAL | Status: DC | PRN
  Administered 2015-01-18 – 2015-01-19 (×3): 50 mg via ORAL
  Administered 2015-01-19: 100 mg via ORAL
  Administered 2015-01-21: 50 mg via ORAL
  Administered 2015-01-22: 100 mg via ORAL
  Filled 2015-01-18 (×2): qty 1
  Filled 2015-01-18: qty 2
  Filled 2015-01-18 (×2): qty 1
  Filled 2015-01-18: qty 2

## 2015-01-18 MED ORDER — OXYCODONE HCL 5 MG PO TABS
5.0000 mg | ORAL_TABLET | ORAL | Status: DC | PRN
  Administered 2015-01-20 (×2): 10 mg via ORAL
  Filled 2015-01-18 (×2): qty 2

## 2015-01-18 MED ORDER — AMIODARONE HCL 200 MG PO TABS
400.0000 mg | ORAL_TABLET | Freq: Two times a day (BID) | ORAL | Status: DC
  Administered 2015-01-18 – 2015-01-20 (×6): 400 mg via ORAL
  Filled 2015-01-18 (×8): qty 2

## 2015-01-18 MED ORDER — SODIUM CHLORIDE 0.9 % IJ SOLN
3.0000 mL | Freq: Two times a day (BID) | INTRAMUSCULAR | Status: DC
  Administered 2015-01-18 – 2015-01-23 (×7): 3 mL via INTRAVENOUS

## 2015-01-18 MED ORDER — METOPROLOL TARTRATE 12.5 MG HALF TABLET
12.5000 mg | ORAL_TABLET | Freq: Two times a day (BID) | ORAL | Status: DC
  Administered 2015-01-18 – 2015-01-19 (×3): 12.5 mg via ORAL
  Filled 2015-01-18 (×5): qty 1

## 2015-01-18 MED ORDER — ALBUTEROL SULFATE (2.5 MG/3ML) 0.083% IN NEBU: 2.5000 mg | INHALATION_SOLUTION | RESPIRATORY_TRACT | Status: DC | PRN

## 2015-01-18 MED ORDER — PANTOPRAZOLE SODIUM 40 MG PO TBEC
40.0000 mg | DELAYED_RELEASE_TABLET | Freq: Every day | ORAL | Status: DC
Start: 2015-01-19 — End: 2015-01-23
  Administered 2015-01-19 – 2015-01-23 (×5): 40 mg via ORAL
  Filled 2015-01-18 (×4): qty 1

## 2015-01-18 MED ORDER — SODIUM CHLORIDE 0.9 % IJ SOLN: 3.0000 mL | INTRAMUSCULAR | Status: DC | PRN

## 2015-01-18 NOTE — Plan of Care (Signed)
Problem: Phase II - Intermediate Post-Op Goal: Advance Diet Outcome: Completed/Met Date Met:  01/18/15 Heart healthy carb mod Goal: Activity Progressed Outcome: Completed/Met Date Met:  01/18/15 Ambulated x 2 500 and 600 feet

## 2015-01-18 NOTE — Progress Notes (Signed)
5 Days Post-Op Procedure(s) (LRB): CORONARY ARTERY BYPASS GRAFTING (CABG) times four, using left internal mammary artery and left greater saphenous vein. (N/A) TRANSESOPHAGEAL ECHOCARDIOGRAM (TEE) (N/A) Subjective:  No complaints  Ambulated  Eating and bowels working  Objective: Vital signs in last 24 hours: Temp:  [97.1 F (36.2 C)-97.9 F (36.6 C)] 97.7 F (36.5 C) (02/13 1100) Pulse Rate:  [85-118] 87 (02/13 1300) Cardiac Rhythm:  [-] Atrial fibrillation (02/13 1307) Resp:  [13-25] 20 (02/13 1300) BP: (92-136)/(53-90) 106/62 mmHg (02/13 1300) SpO2:  [94 %-100 %] 100 % (02/13 1300) Weight:  [92.4 kg (203 lb 11.3 oz)] 92.4 kg (203 lb 11.3 oz) (02/13 0500)  Hemodynamic parameters for last 24 hours:    Intake/Output from previous day: 02/12 0701 - 02/13 0700 In: 1122.1 [P.O.:300; I.V.:722.1; IV Piggyback:100] Out: 2390 [Urine:2390] Intake/Output this shift: Total I/O In: 170.2 [I.V.:120.2; IV Piggyback:50] Out: 1880 [Urine:1880]  General appearance: alert and cooperative Heart: irregular rate and rhythm, S1, S2 normal, no murmur, click, rub or gallop Lungs: clear to auscultation bilaterally Extremities: edema mild Wound: dressing intact  Lab Results:  Recent Labs  01/17/15 0801 01/18/15 0300  WBC 20.3* 11.9*  HGB 9.2* 7.4*  HCT 28.1* 22.7*  PLT 102* 59*   BMET:  Recent Labs  01/17/15 0801 01/18/15 0300  NA 132* 133*  [name] 4.1 4.1  CL 96 97  CO2 23 30  GLUCOSE 135* 113*  BUN 53* 50*  CREATININE 2.17* 1.64*  CALCIUM 8.5 8.3*    PT/INR: No results for input(s): LABPROT, INR in the last 72 hours. ABG    Component Value Date/Time   PHART 7.404 01/13/2015 1955   HCO3 24.5* 01/13/2015 1955   TCO2 21 01/14/2015 1620   ACIDBASEDEF 1.0 01/13/2015 1820   O2SAT 98.0 01/13/2015 1955   CBG (last 3)   Recent Labs  01/18/15 0353 01/18/15 0844 01/18/15 1223  GLUCAP 108* 97 143*   CLINICAL DATA: Shortness of breath. Nausea.  EXAM: PORTABLE CHEST -  1 VIEW  COMPARISON: 01/17/2015.  FINDINGS: Stable enlarged cardiac and mediastinal contours status post median sternotomy and CABG procedure. Interval removal of right internal jugular sheath. Low lung volumes. Persistent small to moderate left pleural effusion and unchanged left mid and lower lung heterogeneous pulmonary opacities. No definite pneumothorax.  IMPRESSION: No significant interval change left lower lobe atelectasis/ infiltrate and left pleural effusion.   Electronically Signed  By: Lovey Newcomer M.D.  On: 01/18/2015 07:24  Assessment/Plan: S/P Procedure(s) (LRB): CORONARY ARTERY BYPASS GRAFTING (CABG) times four, using left internal mammary artery and left greater saphenous vein. (N/A) TRANSESOPHAGEAL ECHOCARDIOGRAM (TEE) (N/A)  He is hemodynamically stable in atrial fibrillation with controlled rate 80-100 on IV amio. He is getting this through a peripheral line so will switch to po. He is not a candidate for oral anticoagulation due to myelodysplasia with thrombocytopenia and anemia.  Acute postop renal failure continues to improve. Urine ouput with lasix has been good and wt still 9 lbs over preop but decreasing.  CXR stable with LLL atelectasis or infiltrate. Productive cough. On Fortaz.  Will transfer to 2W and continue mobilization.   LOS: 8 days    Eric [name],Eric [name] 01/18/2015

## 2015-01-18 NOTE — Progress Notes (Signed)
Patient ID: Eric [name], male   DOB: Mar 15, 1946, 69 y.o.   MRN: [id number]   SUBJECTIVE:  Post op pain Worried about "mole" on left side of neck  Has been OOB   OBJECTIVE:   Vitals:   Filed Vitals:   01/18/15 0905 01/18/15 0917 01/18/15 1000 01/18/15 1100  BP: 121/68  125/58 115/60  Pulse:  118  88  Temp:      TempSrc:      Resp: 20 16 22 19   Height:      Weight:      SpO2: 95% 94%  100%   I&O's:    Intake/Output Summary (Last 24 hours) at 01/18/15 1150 Last data filed at 01/18/15 1100  Gross per 24 hour  Intake  972.1 ml  Output   2825 ml  Net -1852.9 ml   TELEMETRY: Afib rates 80-90       PHYSICAL EXAM General: Well developed, well nourished, in no acute distress Head: Eyes PERRLA, No xanthomas.   Normal cephalic and atramatic  Lungs:   Clear bilaterally to auscultation and percussion. Heart:   HRRR S1 S2 Pulses are 2+ & equal.  Post CABG  RIJ  Abdomen: Bowel sounds are positive, abdomen soft and non-tender without masses  Extremities:   No clubbing, cyanosis or edema.  DP +1 Neuro: Alert and oriented X 3. Psych:  Good affect, responds appropriately Small mole on left neck that bleeds  Margins fairly regular   LABS: Basic Metabolic Panel:  Recent Labs  01/17/15 0801 01/18/15 0300  NA 132* 133*  K 4.1 4.1  CL 96 97  CO2 23 30  GLUCOSE 135* 113*  BUN 53* 50*  CREATININE 2.17* 1.64*  CALCIUM 8.5 8.3*   Liver Function Tests:  Recent Labs  01/17/15 0801 01/18/15 0300  AST 28 24  ALT 22 18  ALKPHOS 59 54  BILITOT 1.1 0.9  PROT 7.0 6.2  ALBUMIN 3.2* 2.8*   CBC:  Recent Labs  01/17/15 0801 01/18/15 0300  WBC 20.3* 11.9*  HGB 9.2* 7.4*  HCT 28.1* 22.7*  MCV 80.7 80.2  PLT 102* 59*    Lab Results  Component Value Date   INR 1.48 01/13/2015   INR 1.1* 12/31/2014   INR 1.15 01/26/2013    RADIOLOGY:  ASSESSMENT AND PLAN: Afib:  Rate control is fine not a candidate for anticoagulation due to low PLT ;s and myelodysplastic syndrome.   Continue iv amiodarone.  Routine post CABG care  He can have lesion on left neck assessed by dermatologist as outpatient      Jenkins Rouge, MD  01/18/2015  11:50 AM

## 2015-01-18 NOTE — Plan of Care (Signed)
Problem: Phase II - Intermediate Post-Op Goal: Patient advanced to Phase III: Barriers addressed Outcome: Completed/Met Date Met:  01/18/15 Discontinuing iv amiodarone and placing on po. Ambulated x 2 today and tolerated well. Ileus resolving and has had BMs. Prepared for transfer to 2W.

## 2015-01-18 NOTE — Progress Notes (Addendum)
Patient ambulated in hall pushing wheelchair with 2L of oxygen, 500 ft. He tolerated well, without increased pain or shortness of breath.

## 2015-01-19 LAB — CBC
HCT: 24.7 % — ABNORMAL LOW (ref 39.0–52.0)
HEMOGLOBIN: 7.8 g/dL — AB (ref 13.0–17.0)
MCH: 26 pg (ref 26.0–34.0)
MCHC: 31.6 g/dL (ref 30.0–36.0)
MCV: 82.3 fL (ref 78.0–100.0)
PLATELETS: 89 10*3/uL — AB (ref 150–400)
RBC: 3 MIL/uL — ABNORMAL LOW (ref 4.22–5.81)
RDW: 26.5 % — AB (ref 11.5–15.5)
WBC: 7.5 10*3/uL (ref 4.0–10.5)

## 2015-01-19 LAB — GLUCOSE, CAPILLARY
GLUCOSE-CAPILLARY: 116 mg/dL — AB (ref 70–99)
GLUCOSE-CAPILLARY: 160 mg/dL — AB (ref 70–99)
Glucose-Capillary: 168 mg/dL — ABNORMAL HIGH (ref 70–99)
Glucose-Capillary: 147 mg/dL — ABNORMAL HIGH (ref 70–99)

## 2015-01-19 LAB — BASIC METABOLIC PANEL
Anion gap: 9 (ref 5–15)
BUN: 40 mg/dL — ABNORMAL HIGH (ref 6–23)
CALCIUM: 8.2 mg/dL — AB (ref 8.4–10.5)
CO2: 27 mmol/L (ref 19–32)
CREATININE: 1.21 mg/dL (ref 0.50–1.35)
Chloride: 99 mmol/L (ref 96–112)
GFR, EST AFRICAN AMERICAN: 69 mL/min — AB (ref >90)
GFR, EST NON AFRICAN AMERICAN: 60 mL/min — AB (ref >90)
Glucose, Bld: 122 mg/dL — ABNORMAL HIGH (ref 70–99)
Potassium: 4.1 mmol/L (ref 3.5–5.1)
SODIUM: 135 mmol/L (ref 135–145)

## 2015-01-19 MED ORDER — POTASSIUM CHLORIDE CRYS ER 20 MEQ PO TBCR
20.0000 meq | EXTENDED_RELEASE_TABLET | Freq: Once | ORAL | Status: AC
  Administered 2015-01-19: 20 meq via ORAL
  Filled 2015-01-19: qty 1

## 2015-01-19 MED ORDER — FUROSEMIDE 40 MG PO TABS
40.0000 mg | ORAL_TABLET | Freq: Once | ORAL | Status: AC
  Administered 2015-01-19: 40 mg via ORAL
  Filled 2015-01-19 (×2): qty 1

## 2015-01-19 NOTE — Progress Notes (Addendum)
      [address]       [place] [address]             [phone]        6 Days Post-Op Procedure(s) (LRB): CORONARY ARTERY BYPASS GRAFTING (CABG) times four, using left internal mammary artery and left greater saphenous vein. (N/A) TRANSESOPHAGEAL ECHOCARDIOGRAM (TEE) (N/A)  Subjective: Patient eating breakfast. He states he has no nausea, emesis, or abdominal pain. His only complaint is he feels full after a small amount of food  Objective: Vital signs in last 24 hours: Temp:  [97.7 F (36.5 C)-98.2 F (36.8 C)] 98.2 F (36.8 C) (02/14 0516) Pulse Rate:  [31-118] 89 (02/14 0516) Cardiac Rhythm:  [-] Atrial fibrillation (02/13 1931) Resp:  [16-31] 18 (02/14 0516) BP: (97-152)/(54-89) 138/70 mmHg (02/14 0516) SpO2:  [90 %-100 %] 97 % (02/14 0516) Weight:  [199 lb 12.8 oz (90.629 kg)] 199 lb 12.8 oz (90.629 kg) (02/14 0516)  Pre op weight 88 kg Current Weight  01/19/15 199 lb 12.8 oz (90.629 kg)      Intake/Output from previous day: 02/13 0701 - 02/14 0700 In: 1060 [P.O.:780; I.V.:180; IV Piggyback:100] Out: 2820 [Urine:2820]   Physical Exam:  Cardiovascular: [facility] Pulmonary: Clear to auscultation on right and diminished left base; no rales, wheezes, or rhonchi. Abdomen: Soft, non tender, bowel sounds present. Extremities: SCDs in place Wounds: Clean and dry.  No erythema or signs of infection.  Lab Results: CBC: Recent Labs  01/18/15 0300 01/19/15 0455  WBC 11.9* 7.5  HGB 7.4* 7.8*  HCT 22.7* 24.7*  PLT 59* 89*   BMET:  Recent Labs  01/18/15 0300 01/19/15 0455  NA 133* 135  K 4.1 4.1  CL 97 99  CO2 30 27  GLUCOSE 113* 122*  BUN 50* 40*  CREATININE 1.64* 1.21  CALCIUM 8.3* 8.2*    PT/INR:  Lab Results  Component Value Date   INR 1.48 01/13/2015   INR 1.1* 12/31/2014   INR 1.15 01/26/2013   ABG:  INR: Will add last result for INR, ABG once components are confirmed Will add last 4 CBG results once components are  confirmed  Assessment/Plan:  1. CV - Remains in atrial fibrillation with CVR. On Amiodarone 400 bid, Lopressor 12.5 bid. NOT candidate for anticoagulation as has MDS 2.  Pulmonary - On 2 liters of oxygen via [place]. Wean as tolerates. CXR shows no pneumothorax, low lung volumes, stable left pleural effusion,atelectasis/infiltrate. 3. Volume Overload - Will give Lasix 40 po today 4.  Acute blood loss anemia - H and H stable at 7.8 and 24.7 5. Acute post op renal failure resolved as creatinine down to 1.21 6. ID-on Tressie Ellis for possible early PNA 7. Thrombocytopenia-platelets up to 89,000. Has MDS and chronically low platelets 8.GI-KUB this am shows less distention of small bowel and colonic diameter appears normal 9. Remove EPW 10. CBGs 148/152/116. Pre op HGA1C 5.5. Tolerating diet. Will stop accu checks and SS PRN 11. Possibly home 1-2 days  ZIMMERMAN,DONIELLE MPA-C 01/19/2015,7:38 AM   Chart reviewed, patient examined, agree with above. He is making progress. Renal function improved. HCT stable and plts increasing.

## 2015-01-19 NOTE — Progress Notes (Signed)
Removed EPW per MD order per hospital policy. Patient tolerated well. Pacing wires intact upon removal. VSS.Patient reminded to remain in bed for 1 hour.

## 2015-01-19 NOTE — Progress Notes (Signed)
Patient has urinary frequency and he is unable to use urinal to measure urine effectively.

## 2015-01-19 NOTE — Progress Notes (Signed)
Pt requested alprazolam for sleep as it had helped him sleep well the night before. However, last night it seemed to have the opposite effect and the patient did not sleep much. The morning he ambulated 600 feet with rolling walker on room air. He tolerated the walk well with no dyspnea or complaints of pain.

## 2015-01-20 LAB — GLUCOSE, CAPILLARY
GLUCOSE-CAPILLARY: 126 mg/dL — AB (ref 70–99)
GLUCOSE-CAPILLARY: 141 mg/dL — AB (ref 70–99)
Glucose-Capillary: 151 mg/dL — ABNORMAL HIGH (ref 70–99)
Glucose-Capillary: 138 mg/dL — ABNORMAL HIGH (ref 70–99)

## 2015-01-20 LAB — BASIC METABOLIC PANEL
ANION GAP: 6 (ref 5–15)
BUN: 33 mg/dL — AB (ref 6–23)
CALCIUM: 8.1 mg/dL — AB (ref 8.4–10.5)
CHLORIDE: 98 mmol/L (ref 96–112)
CO2: 31 mmol/L (ref 19–32)
Creatinine, Ser: 1.09 mg/dL (ref 0.50–1.35)
GFR, EST AFRICAN AMERICAN: 79 mL/min — AB (ref >90)
GFR, EST NON AFRICAN AMERICAN: 68 mL/min — AB (ref >90)
Glucose, Bld: 147 mg/dL — ABNORMAL HIGH (ref 70–99)
Potassium: 4 mmol/L (ref 3.5–5.1)
SODIUM: 135 mmol/L (ref 135–145)

## 2015-01-20 MED ORDER — POTASSIUM CHLORIDE CRYS ER 20 MEQ PO TBCR
20.0000 meq | EXTENDED_RELEASE_TABLET | Freq: Once | ORAL | Status: AC
  Administered 2015-01-20: 20 meq via ORAL
  Filled 2015-01-20: qty 1

## 2015-01-20 MED ORDER — METOPROLOL TARTRATE 25 MG PO TABS
25.0000 mg | ORAL_TABLET | Freq: Two times a day (BID) | ORAL | Status: DC
  Administered 2015-01-20 – 2015-01-23 (×7): 25 mg via ORAL
  Filled 2015-01-20 (×8): qty 1

## 2015-01-20 MED ORDER — DEXTROSE 5 % IV SOLN
1.0000 g | Freq: Three times a day (TID) | INTRAVENOUS | Status: DC
  Administered 2015-01-20 – 2015-01-21 (×3): 1 g via INTRAVENOUS
  Filled 2015-01-20 (×5): qty 1

## 2015-01-20 MED ORDER — FUROSEMIDE 40 MG PO TABS
40.0000 mg | ORAL_TABLET | Freq: Once | ORAL | Status: AC
  Administered 2015-01-20: 40 mg via ORAL
  Filled 2015-01-20: qty 1

## 2015-01-20 MED ORDER — FUROSEMIDE 40 MG PO TABS
40.0000 mg | ORAL_TABLET | Freq: Every day | ORAL | Status: AC
  Administered 2015-01-21 – 2015-01-23 (×3): 40 mg via ORAL
  Filled 2015-01-20 (×3): qty 1

## 2015-01-20 MED ORDER — POTASSIUM CHLORIDE CRYS ER 20 MEQ PO TBCR
20.0000 meq | EXTENDED_RELEASE_TABLET | Freq: Two times a day (BID) | ORAL | Status: DC
  Administered 2015-01-21 – 2015-01-23 (×5): 20 meq via ORAL
  Filled 2015-01-20 (×7): qty 1

## 2015-01-20 NOTE — Progress Notes (Signed)
CARDIAC REHAB PHASE I   PRE:  Rate/Rhythm: 106 afib  BP:  Supine:   Sitting: 138/60  Standing:    SaO2: 97%RA  MODE:  Ambulation: 550 ft   POST:  Rate/Rhythm: 122 afib  BP:  Supine:   Sitting:   Standing:  To bathroom   SaO2: 97%RA 1002-1038 Pt requesting med for headache. Waited for pt to get before walking. Pt walked 550 ft on RA with rolling walker with asst x 1 with steady gait. Needed to go to bathroom so to bathroom after walk. Encouraged pt to use call light and notified NT that pt in bathroom. Stated headache a little better during walk.    Graylon Good, RN BSN  01/20/2015 10:33 AM

## 2015-01-20 NOTE — Progress Notes (Signed)
Medicare Important Message given? YES  (If response is "NO", the following Medicare IM given date fields will be blank)  Date Medicare IM given: 01/20/15 Medicare IM given by:  Buell Parcel  

## 2015-01-20 NOTE — Progress Notes (Addendum)
      [address]       [place] [address]             [phone]      7 Days Post-Op Procedure(s) (LRB): CORONARY ARTERY BYPASS GRAFTING (CABG) times four, using left internal mammary artery and left greater saphenous vein. (N/A) TRANSESOPHAGEAL ECHOCARDIOGRAM (TEE) (N/A) Subjective: Feels better each day, remains in afib- a bit fast at times  Objective: Vital signs in last 24 hours: Temp:  [98.1 F (36.7 C)-98.9 F (37.2 C)] 98.9 F (37.2 C) (02/15 0547) Pulse Rate:  [91-105] 100 (02/15 0547) Cardiac Rhythm:  [-] Atrial fibrillation (02/14 1041) Resp:  [18] 18 (02/15 0547) BP: (111-140)/(60-71) 140/61 mmHg (02/15 0547) SpO2:  [93 %-98 %] 98 % (02/15 0547) Weight:  [199 lb 12.8 oz (90.629 kg)] 199 lb 12.8 oz (90.629 kg) (02/15 0547)  Hemodynamic parameters for last 24 hours:    Intake/Output from previous day: 02/14 0701 - 02/15 0700 In: 410 [P.O.:360; IV Piggyback:50] Out: 900 [Urine:900] Intake/Output this shift:    General appearance: alert, cooperative and no distress Heart: irregularly irregular rhythm Lungs: dim in the left base Abdomen: mild distension- non-tender Extremities: some LE edema Wound: incis healing well  Lab Results:  Recent Labs  01/18/15 0300 01/19/15 0455  WBC 11.9* 7.5  HGB 7.4* 7.8*  HCT 22.7* 24.7*  PLT 59* 89*   BMET:  Recent Labs  01/19/15 0455 01/20/15 0505  NA 135 135  K 4.1 4.0  CL 99 98  CO2 27 31  GLUCOSE 122* 147*  BUN 40* 33*  CREATININE 1.21 1.09  CALCIUM 8.2* 8.1*    PT/INR: No results for input(s): LABPROT, INR in the last 72 hours. ABG    Component Value Date/Time   PHART 7.404 01/13/2015 1955   HCO3 24.5* 01/13/2015 1955   TCO2 21 01/14/2015 1620   ACIDBASEDEF 1.0 01/13/2015 1820   O2SAT 98.0 01/13/2015 1955   CBG (last 3)   Recent Labs  01/19/15 1127 01/19/15 1607 01/19/15 2203  GLUCAP 160* 168* 147*    Meds Scheduled Meds: . allopurinol  100 mg Oral Daily  .  amiodarone  400 mg Oral BID  . antiseptic oral rinse  7 mL Mouth Rinse BID  . atorvastatin  80 mg Oral q1800  . cefTAZidime (FORTAZ)  IV  1 g Intravenous Q12H  . metoprolol tartrate  12.5 mg Oral BID  . pantoprazole  40 mg Oral QAC breakfast  . predniSONE  10 mg Oral QAC breakfast  . sodium chloride  3 mL Intravenous Q12H   Continuous Infusions:  PRN Meds:.sodium chloride, albuterol, ALPRAZolam, dextromethorphan-guaiFENesin, ondansetron **OR** ondansetron (ZOFRAN) IV, oxyCODONE, sodium chloride, traMADol  Xrays No results found.  Assessment/Plan: S/P Procedure(s) (LRB): CORONARY ARTERY BYPASS GRAFTING (CABG) times four, using left internal mammary artery and left greater saphenous vein. (N/A) TRANSESOPHAGEAL ECHOCARDIOGRAM (TEE) (N/A)  1 steady progress 2 will increase beta blocker for HR control. Not an ac candidate 3 creat improved 4 sugars adeq controlled- hgba1c 5.5 5 will diurese a little more 6 cont to push rehab/pulm toilet  LOS: 10 days    GOLD,WAYNE E 01/20/2015   Chart reviewed, patient examined, agree with above. He says that he feels much better today. Ambulating. Still in A-fib on amio and lopressor. No anticoagulation due to myelodysplasia with anemia and thrombocytopenia. Lungs sound much better. Weight is 4 lbs over preop and he still has some leg edema. Continue diuresis.

## 2015-01-20 NOTE — Progress Notes (Signed)
Utilization review completed.  

## 2015-01-21 ENCOUNTER — Inpatient Hospital Stay (HOSPITAL_COMMUNITY): Payer: Medicare Other

## 2015-01-21 LAB — BASIC METABOLIC PANEL
Anion gap: 13 (ref 5–15)
BUN: 31 mg/dL — ABNORMAL HIGH (ref 6–23)
CO2: 23 mmol/L (ref 19–32)
Calcium: 8.4 mg/dL (ref 8.4–10.5)
Chloride: 102 mmol/L (ref 96–112)
Creatinine, Ser: 0.98 mg/dL (ref 0.50–1.35)
GFR calc Af Amer: >90 mL/min (ref >90)
GFR calc non Af Amer: 83 mL/min — ABNORMAL LOW (ref >90)
Glucose, Bld: 114 mg/dL — ABNORMAL HIGH (ref 70–99)
Potassium: 4.2 mmol/L (ref 3.5–5.1)
Sodium: 138 mmol/L (ref 135–145)

## 2015-01-21 LAB — CBC
HCT: 21 % — ABNORMAL LOW (ref 39.0–52.0)
Hemoglobin: 6.6 g/dL — CL (ref 13.0–17.0)
MCH: 25.9 pg — ABNORMAL LOW (ref 26.0–34.0)
MCHC: 31.4 g/dL (ref 30.0–36.0)
MCV: 82.4 fL (ref 78.0–100.0)
Platelets: 82 10*3/uL — ABNORMAL LOW (ref 150–400)
RBC: 2.55 MIL/uL — ABNORMAL LOW (ref 4.22–5.81)
RDW: 26 % — ABNORMAL HIGH (ref 11.5–15.5)
WBC: 5.5 10*3/uL (ref 4.0–10.5)

## 2015-01-21 LAB — PREPARE RBC (CROSSMATCH)

## 2015-01-21 MED ORDER — SODIUM CHLORIDE 0.9 % IV SOLN: Freq: Once | INTRAVENOUS | Status: DC

## 2015-01-21 MED ORDER — AMIODARONE HCL 200 MG PO TABS
200.0000 mg | ORAL_TABLET | Freq: Two times a day (BID) | ORAL | Status: DC
  Administered 2015-01-21 – 2015-01-23 (×5): 200 mg via ORAL
  Filled 2015-01-21 (×6): qty 1

## 2015-01-21 NOTE — Progress Notes (Signed)
CARDIAC REHAB PHASE I   PRE:  Rate/Rhythm: 99 afib    BP: sitting 120/60    SaO2: 97 RA  MODE:  Ambulation: 660 ft   POST:  Rate/Rhythm: 108 afib    BP: sitting 150/70     SaO2: 99 RA  Pt moving well. Used RW until end of walk when he tried without it. Steady with and without it. Encouraged pt not to bear on arms. SOB walking, hard to walk and talk. HR 108. Return to recliner. Will f/u in am with education. [phone]   Eric [name] [place] CES, ACSM 01/21/2015 2:55 PM

## 2015-01-21 NOTE — Progress Notes (Addendum)
[address]       [place] [address]             [phone]      8 Days Post-Op Procedure(s) (LRB): CORONARY ARTERY BYPASS GRAFTING (CABG) times four, using left internal mammary artery and left greater saphenous vein. (N/A) TRANSESOPHAGEAL ECHOCARDIOGRAM (TEE) (N/A) Subjective: Weak this am  Objective: Vital signs in last 24 hours: Temp:  [97.7 F (36.5 C)-98.7 F (37.1 C)] 98.2 F (36.8 C) (02/16 0515) Pulse Rate:  [94-101] 99 (02/16 0515) Cardiac Rhythm:  [-] Normal sinus rhythm (02/16 0016) Resp:  [18] 18 (02/16 0515) BP: (108-129)/(59-67) 108/61 mmHg (02/16 0515) SpO2:  [97 %-100 %] 97 % (02/16 0515) Weight:  [199 lb 1.2 oz (90.3 kg)] 199 lb 1.2 oz (90.3 kg) (02/16 0515)  Hemodynamic parameters for last 24 hours:    Intake/Output from previous day: 02/15 0701 - 02/16 0700 In: 960 [P.O.:960] Out: 1704 [Urine:1703; Stool:1] Intake/Output this shift:    General appearance: alert, cooperative, fatigued and no distress Heart: regular rate and rhythm and tachy Lungs: dim in left base Abdomen: mod distension, non-tender Extremities: + LE edema Wound: incis healing well  Lab Results:  Recent Labs  01/19/15 0455 01/21/15 0406  WBC 7.5 PENDING  HGB 7.8* 6.6*  HCT 24.7* 21.0*  PLT 89* PENDING   BMET:  Recent Labs  01/20/15 0505 01/21/15 0406  NA 135 138  K 4.0 4.2  CL 98 102  CO2 31 23  GLUCOSE 147* 114*  BUN 33* 31*  CREATININE 1.09 0.98  CALCIUM 8.1* 8.4    PT/INR: No results for input(s): LABPROT, INR in the last 72 hours. ABG    Component Value Date/Time   PHART 7.404 01/13/2015 1955   HCO3 24.5* 01/13/2015 1955   TCO2 21 01/14/2015 1620   ACIDBASEDEF 1.0 01/13/2015 1820   O2SAT 98.0 01/13/2015 1955   CBG (last 3)   Recent Labs  01/20/15 1118 01/20/15 1608 01/20/15 2119  GLUCAP 141* 151* 138*    Meds Scheduled Meds: . allopurinol  100 mg Oral Daily  . amiodarone  400 mg Oral BID  . antiseptic oral  rinse  7 mL Mouth Rinse BID  . atorvastatin  80 mg Oral q1800  . cefTAZidime (FORTAZ)  IV  1 g Intravenous 3 times per day  . furosemide  40 mg Oral Daily  . metoprolol tartrate  25 mg Oral BID  . pantoprazole  40 mg Oral QAC breakfast  . potassium chloride  20 mEq Oral BID  . predniSONE  10 mg Oral QAC breakfast  . sodium chloride  3 mL Intravenous Q12H   Continuous Infusions:  PRN Meds:.sodium chloride, albuterol, ALPRAZolam, dextromethorphan-guaiFENesin, ondansetron **OR** ondansetron (ZOFRAN) IV, oxyCODONE, sodium chloride, traMADol  Xrays Dg Chest 2 View  01/21/2015   CLINICAL DATA:  CABG x4.  Chest soreness  EXAM: CHEST  2 VIEW  COMPARISON:  01/18/2015  FINDINGS: Stable cardiomegaly and upper mediastinal widening post CABG. Small left pleural effusion which is layering or decreased from yesterday. There is mild associated atelectasis. The right lung is comparatively clear. No pneumothorax. No pulmonary edema.  IMPRESSION: Small left pleural effusion with atelectasis.   Electronically Signed   By: Monte Fantasia M.D.   On: 01/21/2015 07:03    Assessment/Plan: S/P Procedure(s) (LRB): CORONARY ARTERY BYPASS GRAFTING (CABG) times four, using left internal mammary artery and left greater saphenous vein. (N/A) TRANSESOPHAGEAL ECHOCARDIOGRAM (TEE) (N/A)  1 symptomatic anemia- will  transfuse this am 2 afib-now sinus tach but QTc>500 - will decrease amio dose 3 cont to diurese 4 he is on fortaz  LOS: 11 days    GOLD,WAYNE E 01/21/2015  Agree with plan to transfuse for HB 6.6 Stop Fortaz-CXR clear patient examined and medical record reviewed,agree with above note. VAN TRIGT III,Elvenia Godden 01/21/2015

## 2015-01-21 NOTE — Progress Notes (Signed)
CCMD called to make sure aware pt converted from afib to sinus. Pt resting with call bell within reach.  Will continue to monitor. Payton Emerald, RN

## 2015-01-21 NOTE — Progress Notes (Signed)
Patient Hgb 6.6, Dr, Servando Snare called and aware, type and screen ordered stat, will notify Dr. Prescott Gum, patient resting comfortably in bed at this time with no complaints of dizziness, chest pain, or SOB, VSS, will continue to monitor closely.  Everardo All RN

## 2015-01-21 NOTE — Progress Notes (Signed)
CRITICAL VALUE ALERT  Critical value received:  Hgb 6.6  Date of notification:  01/21/2015  Time of notification:  0628  Critical value read back: Yes  Nurse who received alert:  Everardo All RN  MD notified (1st page):  Dr. Lanelle Bal  Time of first page:  [phone]  MD notified (2nd page):  Time of second page:  Responding MD:  Dr. Lanelle Bal  Time MD responded:  [phone]

## 2015-01-22 LAB — URINALYSIS W MICROSCOPIC (NOT AT ARMC)
BILIRUBIN URINE: NEGATIVE
GLUCOSE, UA: NEGATIVE mg/dL
HGB URINE DIPSTICK: NEGATIVE
KETONES UR: NEGATIVE mg/dL
Leukocytes, UA: NEGATIVE
Nitrite: NEGATIVE
PH: 6 (ref 5.0–8.0)
Protein, ur: NEGATIVE mg/dL
Specific Gravity, Urine: 1.011 (ref 1.005–1.030)
Urobilinogen, UA: 1 mg/dL (ref 0.0–1.0)

## 2015-01-22 LAB — CBC
HCT: 23.4 % — ABNORMAL LOW (ref 39.0–52.0)
Hemoglobin: 7.7 g/dL — ABNORMAL LOW (ref 13.0–17.0)
MCH: 26.8 pg (ref 26.0–34.0)
MCHC: 32.9 g/dL (ref 30.0–36.0)
MCV: 81.5 fL (ref 78.0–100.0)
Platelets: 95 10*3/uL — ABNORMAL LOW (ref 150–400)
RBC: 2.87 MIL/uL — AB (ref 4.22–5.81)
RDW: 24.8 % — AB (ref 11.5–15.5)
WBC: 5.9 10*3/uL (ref 4.0–10.5)

## 2015-01-22 LAB — PREPARE RBC (CROSSMATCH)

## 2015-01-22 MED ORDER — ATORVASTATIN CALCIUM 80 MG PO TABS: 80.0000 mg | ORAL_TABLET | Freq: Every day | ORAL | Status: DC

## 2015-01-22 MED ORDER — AMIODARONE HCL 200 MG PO TABS: 200.0000 mg | ORAL_TABLET | Freq: Two times a day (BID) | ORAL | Status: DC

## 2015-01-22 MED ORDER — POTASSIUM CHLORIDE CRYS ER 20 MEQ PO TBCR: 20.0000 meq | EXTENDED_RELEASE_TABLET | Freq: Two times a day (BID) | ORAL | Status: DC

## 2015-01-22 MED ORDER — FUROSEMIDE 40 MG PO TABS: 40.0000 mg | ORAL_TABLET | Freq: Every day | ORAL | Status: DC

## 2015-01-22 MED ORDER — OXYCODONE HCL 5 MG PO TABS: 5.0000 mg | ORAL_TABLET | ORAL | Status: DC | PRN

## 2015-01-22 MED ORDER — FUROSEMIDE 10 MG/ML IJ SOLN
20.0000 mg | Freq: Once | INTRAMUSCULAR | Status: DC
  Filled 2015-01-22: qty 2

## 2015-01-22 MED ORDER — FE FUMARATE-B12-VIT C-FA-IFC PO CAPS: 1.0000 | ORAL_CAPSULE | Freq: Three times a day (TID) | ORAL | Status: DC

## 2015-01-22 MED ORDER — FE FUMARATE-B12-VIT C-FA-IFC PO CAPS
1.0000 | ORAL_CAPSULE | Freq: Three times a day (TID) | ORAL | Status: DC
  Administered 2015-01-22 – 2015-01-23 (×4): 1 via ORAL
  Filled 2015-01-22 (×7): qty 1

## 2015-01-22 MED ORDER — PROMETHAZINE HCL 25 MG/ML IJ SOLN
12.5000 mg | Freq: Four times a day (QID) | INTRAMUSCULAR | Status: DC | PRN
  Administered 2015-01-22: 12.5 mg via INTRAVENOUS
  Filled 2015-01-22: qty 1

## 2015-01-22 MED ORDER — SODIUM CHLORIDE 0.9 % IV SOLN
Freq: Once | INTRAVENOUS | Status: AC
  Administered 2015-01-22: 11:00:00 via INTRAVENOUS

## 2015-01-22 NOTE — Progress Notes (Signed)
Called to patient's room by nurse.  She states the patient developed some confusion, slurred speech and was not acting right.  Upon arrival to the room the patient was undergoing scheduled blood transfusion.  He had some nausea which did not respond to zofran.  I therefore had ordered Phenergan as a back up.   This was given to the patient prior to my arrival.  On exam the patient was alert and oriented.  He did have some slurred speech, but neuro exam was completely normal.   I feel this is most likely related to the phenergan.  However due to blood being ran at this time we will stop blood transfusion in case this is a transfusion reaction.  We will repeat CBC in am.  No further Phenergan will be given.

## 2015-01-22 NOTE — Progress Notes (Signed)
[id number] Pt walked earlier this morning. Education completed with pt and wife who voiced understanding. Encouraged IS and walking. Gave heart healthy diet. Pt stated he has walker at home if he needs to use. Discussed CRP 2 and permission given to refer to [place]. Put on discharge video for pt and wife to view.Graylon Good RN BSN 01/22/2015 10:20 AM

## 2015-01-22 NOTE — Progress Notes (Addendum)
      [address]       [place] [address]             [phone]       Days Post-Op Procedure(s) (LRB): CORONARY ARTERY BYPASS GRAFTING (CABG) times four, using left internal mammary artery and left greater saphenous vein. (N/A) TRANSESOPHAGEAL ECHOCARDIOGRAM (TEE) (N/A)   Subjective:  [name] is feeling better this morning, but he still feels weak.  He was hoping to get discharged home today.  Objective: Vital signs in last 24 hours: Temp:  [97.7 F (36.5 C)-99 F (37.2 C)] 98.3 F (36.8 C) (02/17 0603) Pulse Rate:  [76-98] 76 (02/17 0603) Cardiac Rhythm:  [-] Normal sinus rhythm (02/17 0800) Resp:  [18-22] 18 (02/17 0603) BP: (106-121)/(50-58) 121/53 mmHg (02/17 0603) SpO2:  [93 %-99 %] 99 % (02/17 0603) Weight:  [201 lb 1.6 oz (91.218 kg)] 201 lb 1.6 oz (91.218 kg) (02/17 0603)  Intake/Output from previous day: 02/16 0701 - 02/17 0700 In: 720 [P.O.:720] Out: 452 [Urine:450; Stool:2]  General appearance: alert, cooperative and no distress Heart: regular rate and rhythm Lungs: clear to auscultation bilaterally Abdomen: soft, non-tender; bowel sounds normal; no masses,  no organomegaly Extremities:mild edema Wound: clean and dry  Lab Results:  Recent Labs  01/21/15 0406 01/22/15 0638  WBC 5.5 5.9  HGB 6.6* 7.7*  HCT 21.0* 23.4*  PLT 82* 95*   BMET:  Recent Labs  01/20/15 0505 01/21/15 0406  NA 135 138  K 4.0 4.2  CL 98 102  CO2 31 23  GLUCOSE 147* 114*  BUN 33* 31*  CREATININE 1.09 0.98  CALCIUM 8.1* 8.4    PT/INR: No results for input(s): LABPROT, INR in the last 72 hours. ABG    Component Value Date/Time   PHART 7.404 01/13/2015 1955   HCO3 24.5* 01/13/2015 1955   TCO2 21 01/14/2015 1620   ACIDBASEDEF 1.0 01/13/2015 1820   O2SAT 98.0 01/13/2015 1955   CBG (last 3)   Recent Labs  01/20/15 1118 01/20/15 1608 01/20/15 2119  GLUCAP 141* 151* 138*    Assessment/Plan: S/P Procedure(s) (LRB): CORONARY ARTERY  BYPASS GRAFTING (CABG) times four, using left internal mammary artery and left greater saphenous vein. (N/A) TRANSESOPHAGEAL ECHOCARDIOGRAM (TEE) (N/A)  1. CV- Previous A. Fib, NSR currently continue Amiodarone, Lopressor 2. Pulm- no acute issues, continue IS 3. Renal- creatinine WNL, mild hypervolemia continue Lasix 4. Chronic Anemia-  Hgb up to 7.7, will give additional unit today- patient to follow up with Hematologist 5. Dispo- patient stable, will transfuse 1 unit today and d/c later this afternoon    LOS: 12 days    BARRETT, ERIN 01/22/2015  patient examined and medical record reviewed,agree with above note. VAN TRIGT III,Aser Nylund 01/22/2015

## 2015-01-22 NOTE — Progress Notes (Signed)
Patient began to feel nauseated prior to blood administration; IV zofran given; during blood administration patient continued to experience nausea, PA notified-PA ordered IV phenergan; Patient began to act more lethargic-couldn't keep eyes opened for extended period of time, VS stable, neuro assessment completed, PA at bedside, blood administration DC'd, will continue to monitor and assess. Rowe Pavy, RN

## 2015-01-23 LAB — TYPE AND SCREEN
ABO/RH(D): O POS
Antibody Screen: NEGATIVE
Unit division: 0
Unit division: 0

## 2015-01-23 LAB — CBC
HCT: 23.9 % — ABNORMAL LOW (ref 39.0–52.0)
HEMOGLOBIN: 7.9 g/dL — AB (ref 13.0–17.0)
MCH: 27.2 pg (ref 26.0–34.0)
MCHC: 33.1 g/dL (ref 30.0–36.0)
MCV: 82.4 fL (ref 78.0–100.0)
Platelets: 98 10*3/uL — ABNORMAL LOW (ref 150–400)
RBC: 2.9 MIL/uL — ABNORMAL LOW (ref 4.22–5.81)
RDW: 23.9 % — ABNORMAL HIGH (ref 11.5–15.5)
WBC: 5.5 10*3/uL (ref 4.0–10.5)

## 2015-01-23 LAB — TRANSFUSION REACTION
DAT C3: NEGATIVE
Post RXN DAT IgG: NEGATIVE

## 2015-01-23 NOTE — Progress Notes (Signed)
      [address]       [place] [address]             [phone]      10 Days Post-Op Procedure(s) (LRB): CORONARY ARTERY BYPASS GRAFTING (CABG) times four, using left internal mammary artery and left greater saphenous vein. (N/A) TRANSESOPHAGEAL ECHOCARDIOGRAM (TEE) (N/A) Subjective: Feels ok, no specific complaints   Objective: Vital signs in last 24 hours: Temp:  [98 F (36.7 C)-98.7 F (37.1 C)] 98.3 F (36.8 C) (02/18 0423) Pulse Rate:  [69-82] 69 (02/18 0423) Cardiac Rhythm:  [-] Normal sinus rhythm (02/17 2323) Resp:  [16-20] 18 (02/18 0423) BP: (111-120)/(56-98) 120/63 mmHg (02/18 0423) SpO2:  [96 %-100 %] 97 % (02/18 0423)  Hemodynamic parameters for last 24 hours:    Intake/Output from previous day: 02/17 0701 - 02/18 0700 In: 870 [P.O.:840; Blood:30] Out: 1550 [Urine:1550] Intake/Output this shift:    General appearance: alert, cooperative and no distress Neurologic: intact Heart: regular rate and rhythm Lungs: mildly dim in the left base Abdomen: benign Extremities: + edema Wound: incis healing well  Lab Results:  Recent Labs  01/21/15 0406 01/22/15 0638  WBC 5.5 5.9  HGB 6.6* 7.7*  HCT 21.0* 23.4*  PLT 82* 95*   BMET:  Recent Labs  01/21/15 0406  NA 138  K 4.2  CL 102  CO2 23  GLUCOSE 114*  BUN 31*  CREATININE 0.98  CALCIUM 8.4    PT/INR: No results for input(s): LABPROT, INR in the last 72 hours. ABG    Component Value Date/Time   PHART 7.404 01/13/2015 1955   HCO3 24.5* 01/13/2015 1955   TCO2 21 01/14/2015 1620   ACIDBASEDEF 1.0 01/13/2015 1820   O2SAT 98.0 01/13/2015 1955   CBG (last 3)   Recent Labs  01/20/15 1118 01/20/15 1608 01/20/15 2119  GLUCAP 141* 151* 138*    Meds Scheduled Meds: . sodium chloride   Intravenous Once  . allopurinol  100 mg Oral Daily  . amiodarone  200 mg Oral BID  . antiseptic oral rinse  7 mL Mouth Rinse BID  . atorvastatin  80 mg Oral q1800  . ferrous  [id number] C-folic acid  1 capsule Oral TID PC  . furosemide  20 mg Intravenous Once  . furosemide  40 mg Oral Daily  . metoprolol tartrate  25 mg Oral BID  . pantoprazole  40 mg Oral QAC breakfast  . potassium chloride  20 mEq Oral BID  . predniSONE  10 mg Oral QAC breakfast  . sodium chloride  3 mL Intravenous Q12H   Continuous Infusions:  PRN Meds:.sodium chloride, albuterol, ALPRAZolam, dextromethorphan-guaiFENesin, ondansetron **OR** ondansetron (ZOFRAN) IV, oxyCODONE, sodium chloride, traMADol  Xrays No results found.  Assessment/Plan: S/P Procedure(s) (LRB): CORONARY ARTERY BYPASS GRAFTING (CABG) times four, using left internal mammary artery and left greater saphenous vein. (N/A) TRANSESOPHAGEAL ECHOCARDIOGRAM (TEE) (N/A)  1 doing well, confusion resolved 2 cbc is currently pending 3 prob d/c today     LOS: 13 days    Eric [name] 01/23/2015

## 2015-01-29 ENCOUNTER — Telehealth: Payer: Self-pay | Admitting: Hematology and Oncology

## 2015-01-29 ENCOUNTER — Other Ambulatory Visit: Payer: Self-pay | Admitting: *Deleted

## 2015-01-29 ENCOUNTER — Ambulatory Visit (HOSPITAL_COMMUNITY)
Admission: RE | Admit: 2015-01-29 | Discharge: 2015-01-29 | Disposition: A | Discharge status: Home / Self Care | Payer: Medicare Other | Source: Ambulatory Visit | Attending: Hematology and Oncology | Admitting: Hematology and Oncology

## 2015-01-29 ENCOUNTER — Telehealth: Payer: Self-pay | Admitting: *Deleted

## 2015-01-29 ENCOUNTER — Other Ambulatory Visit: Payer: Self-pay | Admitting: Hematology and Oncology

## 2015-01-29 DIAGNOSIS — D630 Anemia in neoplastic disease: Secondary | ICD-10-CM

## 2015-01-29 NOTE — Telephone Encounter (Signed)
Received call from wife Eric [name] stating that pt was discharged from the hospital on Friday 01/24/15  With Hemoglobin of  7.8 ;  Pt was instructed to follow up with his primary md for Hgb rechecked.  Per wife, pt saw his PCP 01/27/15 with lab rechecked - Hgb also 7.8  Per Eric [name], pt is fine at rest, has some shortness of breath with ambulation - but that could be related to his recent heart surgery, has mild fatigue.  Eric [name] wanted to know if Dr. Alvy Bimler would want pt to come in for lab rechecked sooner than 02/06/15 scheduled appt for injections, and to receive blood transfusion if needed.  Message routed to Dr. Alvy Bimler and Erline Levine, desk nurse today. [name]  Phone    [phone].

## 2015-01-29 NOTE — Telephone Encounter (Signed)
Received instructions from Dr. Alvy Bimler.  Called wife Inez Catalina and informed her that a scheduler will call her with pt's appts  for lab and possible blood transfusion on Fri 01/31/15 as per Dr. Calton Dach instructions.  Inez Catalina voiced understanding. Onc tx sent to scheduler.

## 2015-01-29 NOTE — Telephone Encounter (Signed)
My preference would be to give him blood since he had recent surgery. Either tomorrow or Friday. 1 unit. I will place order if we can confirm he is coming either day

## 2015-01-29 NOTE — Telephone Encounter (Signed)
s.w pt wife and advised on 2.26 appt....pt ok and aware

## 2015-01-30 ENCOUNTER — Other Ambulatory Visit: Payer: Self-pay | Admitting: Family Medicine

## 2015-01-30 ENCOUNTER — Other Ambulatory Visit: Payer: Self-pay | Admitting: Interventional Cardiology

## 2015-01-30 DIAGNOSIS — K828 Other specified diseases of gallbladder: Secondary | ICD-10-CM

## 2015-01-30 MED ORDER — METOPROLOL TARTRATE 50 MG PO TABS: ORAL_TABLET | ORAL | Status: DC

## 2015-01-31 ENCOUNTER — Other Ambulatory Visit (HOSPITAL_BASED_OUTPATIENT_CLINIC_OR_DEPARTMENT_OTHER): Payer: Medicare Other

## 2015-01-31 ENCOUNTER — Encounter: Payer: Self-pay | Admitting: *Deleted

## 2015-01-31 DIAGNOSIS — D630 Anemia in neoplastic disease: Secondary | ICD-10-CM

## 2015-01-31 DIAGNOSIS — D4620 Refractory anemia with excess of blasts, unspecified: Secondary | ICD-10-CM

## 2015-01-31 DIAGNOSIS — D649 Anemia, unspecified: Secondary | ICD-10-CM

## 2015-01-31 DIAGNOSIS — D696 Thrombocytopenia, unspecified: Secondary | ICD-10-CM

## 2015-01-31 DIAGNOSIS — D46Z Other myelodysplastic syndromes: Secondary | ICD-10-CM

## 2015-01-31 LAB — CBC WITH DIFFERENTIAL/PLATELET
BASO%: 0.2 % (ref 0.0–2.0)
Basophils Absolute: 0 10*3/uL (ref 0.0–0.1)
EOS ABS: 0 10*3/uL (ref 0.0–0.5)
EOS%: 0 % (ref 0.0–7.0)
HCT: 27.1 % — ABNORMAL LOW (ref 38.4–49.9)
HEMOGLOBIN: 8.8 g/dL — AB (ref 13.0–17.1)
LYMPH#: 1.4 10*3/uL (ref 0.9–3.3)
LYMPH%: 32.5 % (ref 14.0–49.0)
MCH: 26.5 pg — ABNORMAL LOW (ref 27.2–33.4)
MCHC: 32.5 g/dL (ref 32.0–36.0)
MCV: 81.6 fL (ref 79.3–98.0)
MONO#: 0.5 10*3/uL (ref 0.1–0.9)
MONO%: 10.2 % (ref 0.0–14.0)
NEUT%: 57.1 % (ref 39.0–75.0)
NEUTROS ABS: 2.5 10*3/uL (ref 1.5–6.5)
Platelets: 82 10*3/uL — ABNORMAL LOW (ref 140–400)
RBC: 3.32 10*6/uL — ABNORMAL LOW (ref 4.20–5.82)
RDW: 23 % — AB (ref 11.0–14.6)
WBC: 4.4 10*3/uL (ref 4.0–10.3)
nRBC: 0 % (ref 0–0)

## 2015-01-31 LAB — HOLD TUBE, BLOOD BANK

## 2015-01-31 LAB — TECHNOLOGIST REVIEW

## 2015-01-31 NOTE — Progress Notes (Signed)
Patient came to [facility] today for labs. Hemoglobin was 8.8. This RN assessed patient and he denied SOB, CP, lightheadedness and weakness. Patient stated,"I feel fine. I don't think I need blood." This RN instructed patient to keep the blue arm band on over the weekend in case he started feeling like he needed blood. Patient and wife verbalized understanding.

## 2015-02-03 ENCOUNTER — Telehealth: Payer: Self-pay | Admitting: *Deleted

## 2015-02-03 ENCOUNTER — Telehealth: Payer: Self-pay | Admitting: Physician Assistant

## 2015-02-03 ENCOUNTER — Telehealth: Payer: Self-pay | Admitting: Hematology and Oncology

## 2015-02-03 ENCOUNTER — Encounter: Payer: Self-pay | Admitting: Physician Assistant

## 2015-02-03 ENCOUNTER — Ambulatory Visit (INDEPENDENT_AMBULATORY_CARE_PROVIDER_SITE_OTHER): Payer: Medicare Other | Admitting: Physician Assistant

## 2015-02-03 VITALS — BP 100/58 | HR 56 | Ht 68.0 in | Wt 187.0 lb

## 2015-02-03 DIAGNOSIS — I2510 Atherosclerotic heart disease of native coronary artery without angina pectoris: Secondary | ICD-10-CM

## 2015-02-03 DIAGNOSIS — I10 Essential (primary) hypertension: Secondary | ICD-10-CM

## 2015-02-03 DIAGNOSIS — I480 Paroxysmal atrial fibrillation: Secondary | ICD-10-CM

## 2015-02-03 DIAGNOSIS — I6529 Occlusion and stenosis of unspecified carotid artery: Secondary | ICD-10-CM

## 2015-02-03 DIAGNOSIS — E785 Hyperlipidemia, unspecified: Secondary | ICD-10-CM

## 2015-02-03 LAB — BASIC METABOLIC PANEL
BUN: 32 mg/dL — ABNORMAL HIGH (ref 6–23)
CHLORIDE: 103 meq/L (ref 96–112)
CO2: 27 meq/L (ref 19–32)
Calcium: 9.7 mg/dL (ref 8.4–10.5)
Creatinine, Ser: 1 mg/dL (ref 0.40–1.50)
GFR: 78.77 mL/min (ref >60.00)
Glucose, Bld: 109 mg/dL — ABNORMAL HIGH (ref 70–99)
Potassium: 4.6 mEq/L (ref 3.5–5.1)
Sodium: 136 mEq/L (ref 135–145)

## 2015-02-03 MED ORDER — AMIODARONE HCL 200 MG PO TABS: 200.0000 mg | ORAL_TABLET | Freq: Every day | ORAL | Status: DC

## 2015-02-03 NOTE — Patient Instructions (Signed)
Your physician has requested that you have a carotid duplex TO BE DONE IN 6 MONTHS. This test is an ultrasound of the carotid arteries in your neck. It looks at blood flow through these arteries that supply the brain with blood. Allow one hour for this exam. There are no restrictions or special instructions.  Your physician has recommended you make the following change in your medication:  1. DECREASE AMIODARONE TO 200 MG DAILY; NEW RX SENT IN WITH NEW DIRECTIONS  Your physician recommends that you schedule a follow-up appointment in: 6-8 WEEKS WITH DR. [facility]  Your physician recommends that you return for lab work in: FASTING LIPID AND LIVER PANEL TO BE DONE IN 6 WEEKS  [place]; BMET  You have been referred to [place]

## 2015-02-03 NOTE — Progress Notes (Signed)
Cardiology Office Note   Date:  02/03/2015   ID:  [name], [name] May 24, 1946, MRN [id number]  PCP:  Lilian Coma, MD  Cardiologist:  Dr. Casandra Doffing    Chief Complaint  Patient presents with  . Coronary Artery Disease    s/p CABG  . Hospitalization Follow-up     History of Present Illness: Eric [name] is a 69 y.o. male with a hx of PSVT, myelodysplastic syndrome, HTN, RA. He is followed by Dr. Alvy Bimler at [facility]Onc. He receives regular Aranesp injections. He was evaluated last month for exertional chest pain thought to represent angina.  He was set up for cardiac cath which demonstrated 3 v CAD.  He was referred for CABG. He underwent bypass surgery with Dr. Lucianne Lei Trigt 01/13/15 (LIMA-LAD, SVG-DX, SVG-OM, SVG-PDA). Preoperative carotid Dopplers did demonstrate 60-79% right ICA stenosis.  Postoperative course was complicated by ileus, atrial fibrillation with RVR, volume overload, anemia and thrombocytopenia. He was placed on amiodarone and converted to sinus rhythm. He was transfused with packed red blood cells.  He returns for FU.  He is doing well since DC.  He is having trouble sleeping.  He feels his incisional pain is improved.  He is walking 3 times a day. He denies significant dyspnea. He denies fevers, chills, cough, wheezing. He denies orthopnea, PND or edema. He does have some sinus drainage that is clear. He tells me he finished antibiotics that were given to him for possible hospital-acquired pneumonia prior to discharge.   Studies/Reports Reviewed Today:  Cardiac cath 01/09/15 LM: 20% ostial stenosis.  LAD:   mid LAD 99% stenosis followed by a 90% stenosis. Diag with diffuse proximal 50%  LCx:   40% proximal; OM proximal 60-70% with superior branch of bifurcation with proximal 80% RCA:  Mid serial 50%, distal 99%; PDA proximal 50%   Left Ventricular Angiogram: LVEF=50%  Carotid US 01/2015 - RICA 60 - 01%; LICA 40 - 75%    Echocardiogram 01/10/15:  EF  65-70%, normal WM, Gr 1 DD Mod LAE PASP 40 mmHg    Past Medical History  Diagnosis Date  . MDS (myelodysplastic syndrome), low grade 2012    on observation; MDS FISH panel on 04/26/2013 was normal; cytogenetics were also negative.   . Rheumatoid arthritis(714.0)     was on MTX until MDS dx  . Hypertension   . Anemia   . Family history of ischemic heart disease   . Shortness of breath     exertional  . Dysrhythmia   . Tachycardia 2013    evaluated by Dr Irish Lack @ [facility]  . Gout   . Needs flu shot 08/23/2013  . Headache 03/07/2014  . CAD (coronary artery disease), native coronary artery 01/09/2015    3 v dz, EF 50%    Past Surgical History  Procedure Laterality Date  . Knee arthroscopy Left   . Hernia repair    . Umbilical hernia repair    . Circumcision    . Total knee arthroplasty Right 02/02/2013    Procedure: TOTAL KNEE ARTHROPLASTY;  Surgeon: Yvette Rack., MD;  Location: [facility];  Service: Orthopedics;  Laterality: Right;  RIGHT ARTHROPLASTY KNEE MEDIAL/LATERAL COMPARTMENTS WITH PATELLA RESURFACING   . Left heart catheterization with coronary angiogram N/A 01/09/2015    Procedure: LEFT HEART CATHETERIZATION WITH CORONARY ANGIOGRAM;  Surgeon: Burnell Blanks, MD;  LAD 99/90%, D1 50%, CFX 40%, OM 70%, OM branch 80%, RCA  50%, dRCA 99%, PDA 50%, EF 50%  .  Coronary artery bypass graft N/A 01/13/2015    Procedure: CORONARY ARTERY BYPASS GRAFTING (CABG) times four, using left internal mammary artery and left greater saphenous vein.;  Surgeon: Ivin Poot, MD;  Location: [facility];  Service: Open Heart Surgery;  Laterality: N/A;  . Tee without cardioversion N/A 01/13/2015    Procedure: TRANSESOPHAGEAL ECHOCARDIOGRAM (TEE);  Surgeon: Ivin Poot, MD;  Location: [facility];  Service: Open Heart Surgery;  Laterality: N/A;     Current Outpatient Prescriptions  Medication Sig Dispense Refill  . acetaminophen (TYLENOL) 500 MG tablet Take 1,000 mg by mouth every 6 (six) hours as  needed for pain.    Marland Kitchen alendronate (FOSAMAX) 70 MG tablet Take 70 mg by mouth once a week. On Friday. Take with a full glass of water on an empty stomach.    Marland Kitchen allopurinol (ZYLOPRIM) 100 MG tablet Take 100 mg by mouth daily.    Marland Kitchen ALPRAZolam (XANAX) 0.25 MG tablet Take 0.125 mg by mouth 2 (two) times daily as needed for anxiety.   0  . amiodarone (PACERONE) 200 MG tablet Take 1 tablet (200 mg total) by mouth 2 (two) times daily. 60 tablet 1  . aspirin 81 MG tablet Take 81 mg by mouth daily.    Marland Kitchen atorvastatin (LIPITOR) 80 MG tablet Take 1 tablet (80 mg total) by mouth daily at 6 PM. 30 tablet 3  . Calcium-Vitamin D (CALTRATE 600 PLUS-VIT D PO) Take 1 tablet by mouth 2 (two) times daily.     . Darbepoetin Alfa (ARANESP) 500 MCG/ML SOSY injection Inject 500 mcg into the skin every 21 ( twenty-one) days.    . ferrous [id number] C-folic acid (TRINSICON / FOLTRIN) capsule Take 1 capsule by mouth 3 (three) times daily after meals. 90 capsule 1  . lisinopril-hydrochlorothiazide (PRINZIDE,ZESTORETIC) 20-25 MG per tablet Take 1 tablet by mouth daily. TAKE 1/2 TAB BY MOUTH DAILY  1  . loratadine (CLARITIN) 10 MG tablet Take 10 mg by mouth daily.    . metoprolol (LOPRESSOR) 50 MG tablet take 1/2 tablet by mouth twice a day 30 tablet 6  . NITROSTAT 0.4 MG SL tablet Place 0.4 mg under the tongue every 5 (five) minutes as needed.   0  . Omega-3 Fatty Acids (FISH OIL) 1000 MG CAPS Take 2,000 mg by mouth 2 (two) times daily.    . predniSONE (DELTASONE) 5 MG tablet Take 10 mg by mouth daily.     . traMADol (ULTRAM) 50 MG tablet Take 25 mg by mouth every 4 (four) hours as needed for pain.      No current facility-administered medications for this visit.    Allergies:   Review of patient's allergies indicates no known allergies.    Social History:  The patient  reports that he quit smoking about 45 years ago. His smoking use included Cigarettes. He has never used smokeless tobacco. He reports that he  does not drink alcohol or use illicit drugs.   Family History:  The patient's family history includes Brain cancer in his mother; Breast cancer in his sister; Diabetes in his brother; Heart attack in his brother and father; Heart disease in his brother and father. There is no history of Stroke.    ROS:   Please see the history of present illness.   Review of Systems  All other systems reviewed and are negative.    PHYSICAL EXAM: VS:  BP 100/58 mmHg  Pulse 56  Ht 5\' 8"  (1.727 m)  Wt 187 lb (  84.823 kg)  BMI 28.44 kg/m2    Wt Readings from Last 3 Encounters:  02/03/15 187 lb (84.823 kg)  01/22/15 201 lb 1.6 oz (91.218 kg)  12/31/14 199 lb (90.266 kg)     GEN: Well nourished, well developed, in no acute distress HEENT: normal Neck: no JVD, no masses Cardiac:  Normal S1/S2, RRR; no murmur, no rubs or gallops, no edema  Chest:  Median sternotomy well healed without erythema or drainage Respiratory:  clear to auscultation bilaterally, no wheezing, rhonchi or rales. GI: soft, nontender, nondistended, + BS MS: no deformity or atrophy Skin: warm and dry  Neuro:  CNs II-XII intact, Strength and sensation are intact Psych: Normal affect   EKG:  EKG is ordered today.  It demonstrates:   Sinus bradycardia, HR 56, normal axis, nonspecific ST-T wave changes, no significant change when compared to prior tracings   Recent Labs: 01/14/2015: Magnesium 2.3 01/18/2015: ALT 18 01/21/2015: BUN 31*; Creatinine 0.98; Potassium 4.2; Sodium 138 01/31/2015: Hemoglobin 8.8*; Platelets 82*    Lipid Panel    Component Value Date/Time   CHOL 116 01/10/2015 0530   TRIG 120 01/10/2015 0530   HDL 30* 01/10/2015 0530   CHOLHDL 3.9 01/10/2015 0530   VLDL 24 01/10/2015 0530   LDLCALC 62 01/10/2015 0530      ASSESSMENT AND PLAN:  1.  CAD s/p CABG:  He is doing well since discharge. Continue Lipitor, Lopressor, lisinopril. His platelet counts appear to be stable. I will check with his hematologist  regarding his aspirin. As long as there are no contraindications we will resume aspirin 81 mg daily. Referred to cardiac rehabilitation. 2.  Post Op Atrial Fibrillation:  Maintaining NSR. Decrease amiodarone to 200 mg daily. I suspect that this will be discontinued at his follow-up with Dr. Irish Lack. 3.  HTN:  Controlled. He is back on lisinopril/HCTZ. Continue Lopressor. Check basic metabolic panel today. 4.  Hyperlipidemia:  Continue statin. Arrange follow-up lipids and LFTs. 5.  Myelodysplastic Syndrome:  Follow-up with hematology. 6.  Carotid Stenosis:  Arrange follow-up carotid US in 6 months.   Current medicines are reviewed at length with the patient today.  The patient does not have concerns regarding medicines.  The following changes have been made:  As above.     Labs/ tests ordered today include:  Orders Placed This Encounter  Procedures  . Basic Metabolic Panel (BMET)  . Lipid Profile  . Hepatic function panel  . EKG 12-Lead  . Carotid duplex    Disposition:   FU with Dr. Casandra Doffing in 8 weeks.    Signed, Versie Starks, MHS 02/03/2015 11:29 AM    [facility] [address], [place], [place]  [address] Phone: [phone]; Fax: [phone]   ADDENDUM:  Reviewed with Dr. Alvy Bimler.  Patient can resume ASA.  He will be contacted to start ASA 81 mg qd.

## 2015-02-03 NOTE — Telephone Encounter (Signed)
pt notified about lab results with verbal understanding  

## 2015-02-03 NOTE — Telephone Encounter (Signed)
returned call and s.w. pt and confirmed appt....pt ok and aware °

## 2015-02-03 NOTE — Telephone Encounter (Signed)
Reviewed with Dr. Alvy Bimler. Please tell patient to start ASA 81 mg Once daily  Thank you, Richardson Dopp, PA-C   02/03/2015 12:55 PM

## 2015-02-04 NOTE — Telephone Encounter (Signed)
s/w pt's wife to let he know that [place] W. PA and Dr. Alvy Bimler (Hematologist) said ok to have pt resume ASA 81 mg daily. Wife verbalized understanding to Plan of care.

## 2015-02-04 NOTE — Telephone Encounter (Signed)
s/w pt's wife to let he know that [place] W. PA and Dr. Alvy Bimler (Hematologist) said ok to have pt resume ASA 81 mg daily. Wife verbalized understanding to Plan of care.

## 2015-02-06 ENCOUNTER — Other Ambulatory Visit (HOSPITAL_BASED_OUTPATIENT_CLINIC_OR_DEPARTMENT_OTHER): Payer: Medicare Other

## 2015-02-06 ENCOUNTER — Ambulatory Visit (HOSPITAL_BASED_OUTPATIENT_CLINIC_OR_DEPARTMENT_OTHER): Payer: Medicare Other

## 2015-02-06 DIAGNOSIS — D46Z Other myelodysplastic syndromes: Secondary | ICD-10-CM

## 2015-02-06 DIAGNOSIS — D630 Anemia in neoplastic disease: Secondary | ICD-10-CM

## 2015-02-06 DIAGNOSIS — D696 Thrombocytopenia, unspecified: Secondary | ICD-10-CM

## 2015-02-06 DIAGNOSIS — D4620 Refractory anemia with excess of blasts, unspecified: Secondary | ICD-10-CM

## 2015-02-06 LAB — CBC WITH DIFFERENTIAL/PLATELET
BASO%: 0.2 % (ref 0.0–2.0)
Basophils Absolute: 0 10*3/uL (ref 0.0–0.1)
EOS%: 0 % (ref 0.0–7.0)
Eosinophils Absolute: 0 10*3/uL (ref 0.0–0.5)
HEMATOCRIT: 25 % — AB (ref 38.4–49.9)
HGB: 8 g/dL — ABNORMAL LOW (ref 13.0–17.1)
LYMPH#: 1.5 10*3/uL (ref 0.9–3.3)
LYMPH%: 31.5 % (ref 14.0–49.0)
MCH: 25.9 pg — AB (ref 27.2–33.4)
MCHC: 32 g/dL (ref 32.0–36.0)
MCV: 80.9 fL (ref 79.3–98.0)
MONO#: 0.5 10*3/uL (ref 0.1–0.9)
MONO%: 11.1 % (ref 0.0–14.0)
NEUT#: 2.6 10*3/uL (ref 1.5–6.5)
NEUT%: 57.2 % (ref 39.0–75.0)
NRBC: 0 % (ref 0–0)
Platelets: 69 10*3/uL — ABNORMAL LOW (ref 140–400)
RBC: 3.09 10*6/uL — AB (ref 4.20–5.82)
RDW: 23.1 % — AB (ref 11.0–14.6)
WBC: 4.6 10*3/uL (ref 4.0–10.3)

## 2015-02-06 LAB — HOLD TUBE, BLOOD BANK

## 2015-02-06 LAB — TECHNOLOGIST REVIEW

## 2015-02-06 MED ORDER — DARBEPOETIN ALFA 500 MCG/ML IJ SOSY
500.0000 ug | PREFILLED_SYRINGE | Freq: Once | INTRAMUSCULAR | Status: AC
  Administered 2015-02-06: 500 ug via SUBCUTANEOUS
  Filled 2015-02-06: qty 1

## 2015-02-06 NOTE — Progress Notes (Signed)
Labs back  Hgb 8.0  Pt 2.5 weeks post open heart surgery.  Dr. Alvy Bimler states no need to transfuse at this point unless pt is lightheaded, dizzy, SOB.  Pt states he feels he can do fine without transfusion.  Pt and wife instructed to call if the previous symptoms develop or if other questions and concerns come up.  Both verbalized understanding.

## 2015-02-06 NOTE — Patient Instructions (Signed)
Darbepoetin Alfa injection What is this medicine? DARBEPOETIN ALFA (dar be POE e tin AL fa) helps your body make more red blood cells. It is used to treat anemia caused by chronic kidney failure and chemotherapy. This medicine may be used for other purposes; ask your health care provider or pharmacist if you have questions. COMMON BRAND NAME(S): Aranesp What should I tell my health care provider before I take this medicine? They need to know if you have any of these conditions: -blood clotting disorders or history of blood clots -cancer patient not on chemotherapy -cystic fibrosis -heart disease, such as angina, heart failure, or a history of a heart attack -hemoglobin level of 12 g/dL or greater -high blood pressure -low levels of folate, iron, or vitamin B12 -seizures -an unusual or allergic reaction to darbepoetin, erythropoietin, albumin, hamster proteins, latex, other medicines, foods, dyes, or preservatives -pregnant or trying to get pregnant -breast-feeding How should I use this medicine? This medicine is for injection into a vein or under the skin. It is usually given by a health care professional in a hospital or clinic setting. If you get this medicine at home, you will be taught how to prepare and give this medicine. Do not shake the solution before you withdraw a dose. Use exactly as directed. Take your medicine at regular intervals. Do not take your medicine more often than directed. It is important that you put your used needles and syringes in a special sharps container. Do not put them in a trash can. If you do not have a sharps container, call your pharmacist or healthcare provider to get one. Talk to your pediatrician regarding the use of this medicine in children. While this medicine may be used in children as young as 1 year for selected conditions, precautions do apply. Overdosage: If you think you have taken too much of this medicine contact a poison control center or  emergency room at once. NOTE: This medicine is only for you. Do not share this medicine with others. What if I miss a dose? If you miss a dose, take it as soon as you can. If it is almost time for your next dose, take only that dose. Do not take double or extra doses. What may interact with this medicine? Do not take this medicine with any of the following medications: -epoetin alfa This list may not describe all possible interactions. Give your health care provider a list of all the medicines, herbs, non-prescription drugs, or dietary supplements you use. Also tell them if you smoke, drink alcohol, or use illegal drugs. Some items may interact with your medicine. What should I watch for while using this medicine? Visit your prescriber or health care professional for regular checks on your progress and for the needed blood tests and blood pressure measurements. It is especially important for the doctor to make sure your hemoglobin level is in the desired range, to limit the risk of potential side effects and to give you the best benefit. Keep all appointments for any recommended tests. Check your blood pressure as directed. Ask your doctor what your blood pressure should be and when you should contact him or her. As your body makes more red blood cells, you may need to take iron, folic acid, or vitamin B supplements. Ask your doctor or health care provider which products are right for you. If you have kidney disease continue dietary restrictions, even though this medication can make you feel better. Talk with your doctor or health   care professional about the foods you eat and the vitamins that you take. What side effects may I notice from receiving this medicine? Side effects that you should report to your doctor or health care professional as soon as possible: -allergic reactions like skin rash, itching or hives, swelling of the face, lips, or tongue -breathing problems -changes in vision -chest  pain -confusion, trouble speaking or understanding -feeling faint or lightheaded, falls -high blood pressure -muscle aches or pains -pain, swelling, warmth in the leg -rapid weight gain -severe headaches -sudden numbness or weakness of the face, arm or leg -trouble walking, dizziness, loss of balance or coordination -seizures (convulsions) -swelling of the ankles, feet, hands -unusually weak or tired Side effects that usually do not require medical attention (report to your doctor or health care professional if they continue or are bothersome): -diarrhea -fever, chills (flu-like symptoms) -headaches -nausea, vomiting -redness, stinging, or swelling at site where injected This list may not describe all possible side effects. Call your doctor for medical advice about side effects. You may report side effects to FDA at 1-800-FDA-1088. Where should I keep my medicine? Keep out of the reach of children. Store in a refrigerator between 2 and 8 degrees C (36 and 46 degrees F). Do not freeze. Do not shake. Throw away any unused portion if using a single-dose vial. Throw away any unused medicine after the expiration date. NOTE: This sheet is a summary. It may not cover all possible information. If you have questions about this medicine, talk to your doctor, pharmacist, or health care provider.  2015, Elsevier/Gold Standard. (2008-11-05 10:23:57)  

## 2015-02-14 ENCOUNTER — Other Ambulatory Visit: Payer: Self-pay | Admitting: Cardiothoracic Surgery

## 2015-02-14 DIAGNOSIS — Z951 Presence of aortocoronary bypass graft: Secondary | ICD-10-CM

## 2015-02-19 ENCOUNTER — Ambulatory Visit
Admission: RE | Admit: 2015-02-19 | Discharge: 2015-02-19 | Disposition: A | Discharge status: Home / Self Care | Payer: Medicare Other | Source: Ambulatory Visit | Attending: Cardiothoracic Surgery | Admitting: Cardiothoracic Surgery

## 2015-02-19 ENCOUNTER — Ambulatory Visit (INDEPENDENT_AMBULATORY_CARE_PROVIDER_SITE_OTHER): Payer: Self-pay | Admitting: Cardiothoracic Surgery

## 2015-02-19 ENCOUNTER — Encounter: Payer: Self-pay | Admitting: Cardiothoracic Surgery

## 2015-02-19 VITALS — BP 103/60 | HR 60 | Resp 20 | Ht 68.0 in | Wt 187.0 lb

## 2015-02-19 DIAGNOSIS — Z951 Presence of aortocoronary bypass graft: Secondary | ICD-10-CM

## 2015-02-19 DIAGNOSIS — I25700 Atherosclerosis of coronary artery bypass graft(s), unspecified, with unstable angina pectoris: Secondary | ICD-10-CM

## 2015-02-19 NOTE — Progress Notes (Signed)
PCP is Lilian Coma, MD Referring Provider is Jettie Booze, MD  Chief Complaint  Patient presents with  . Routine Post Op    f/u from surgery with CXR, s/p CABG x 4 01/13/15    [id number] returns for a postop visit one month after CABG x4 performed because of unstable angina and three-vessel CAD.  The patient did well after surgery. The patient had a preoperative history of atrial fibrillation and was maintained on amiodarone postoperatively. He is in sinus rhythm today. The patient denies any recurrent symptoms of angina. He walked 20 minutes yesterday. He is getting stronger and wishes to start doing yardwork etc.the surgical incisions are healing well. He has no symptoms of CHF--no pedal edema orthopnea or PND.  The patient has history of chronic anemia from myelodysplastic syndrome and takes Aranesp injections every 3 weeks. He is finishing a course of oral iron prescribed at the time of hospital discharge. Past Medical History  Diagnosis Date  . MDS (myelodysplastic syndrome), low grade 2012    on observation; MDS FISH panel on 04/26/2013 was normal; cytogenetics were also negative.   . Rheumatoid arthritis(714.0)     was on MTX until MDS dx  . Hypertension   . Anemia   . Family history of ischemic heart disease   . Shortness of breath     exertional  . Dysrhythmia   . Tachycardia 2013    evaluated by Dr Irish Lack @ [facility]  . Gout   . Needs flu shot 08/23/2013  . Headache 03/07/2014  . CAD (coronary artery disease), native coronary artery 01/09/2015    3 v dz, EF 50%    Past Surgical History  Procedure Laterality Date  . Knee arthroscopy Left   . Hernia repair    . Umbilical hernia repair    . Circumcision    . Total knee arthroplasty Right 02/02/2013    Procedure: TOTAL KNEE ARTHROPLASTY;  Surgeon: Yvette Rack., MD;  Location: [facility];  Service: Orthopedics;  Laterality: Right;  RIGHT ARTHROPLASTY KNEE MEDIAL/LATERAL COMPARTMENTS WITH PATELLA RESURFACING   .  Left heart catheterization with coronary angiogram N/A 01/09/2015    Procedure: LEFT HEART CATHETERIZATION WITH CORONARY ANGIOGRAM;  Surgeon: Burnell Blanks, MD;  LAD 99/90%, D1 50%, CFX 40%, OM 70%, OM branch 80%, RCA  50%, dRCA 99%, PDA 50%, EF 50%  . Coronary artery bypass graft N/A 01/13/2015    Procedure: CORONARY ARTERY BYPASS GRAFTING (CABG) times four, using left internal mammary artery and left greater saphenous vein.;  Surgeon: Ivin Poot, MD;  Location: [facility];  Service: Open Heart Surgery;  Laterality: N/A;  . Tee without cardioversion N/A 01/13/2015    Procedure: TRANSESOPHAGEAL ECHOCARDIOGRAM (TEE);  Surgeon: Ivin Poot, MD;  Location: [facility];  Service: Open Heart Surgery;  Laterality: N/A;    Family History  Problem Relation Age of Onset  . Brain cancer Mother   . Heart disease Father     open heart surgery  . Breast cancer Sister   . Heart disease Brother     open heart surgery  . Diabetes Brother   . Heart attack Father   . Stroke Neg Hx   . Heart attack Brother     Social History History  Substance Use Topics  . Smoking status: Former Smoker    Types: Cigarettes    Quit date: 01/26/1970  . Smokeless tobacco: Never Used  . Alcohol Use: No    Current Outpatient Prescriptions  Medication Sig  Dispense Refill  . acetaminophen (TYLENOL) 500 MG tablet Take 1,000 mg by mouth every 6 (six) hours as needed for pain.    Marland Kitchen alendronate (FOSAMAX) 70 MG tablet Take 70 mg by mouth once a week. On Friday. Take with a full glass of water on an empty stomach.    Marland Kitchen allopurinol (ZYLOPRIM) 100 MG tablet Take 100 mg by mouth daily.    Marland Kitchen ALPRAZolam (XANAX) 0.25 MG tablet Take 0.125 mg by mouth 2 (two) times daily as needed for anxiety.   0  . amiodarone (PACERONE) 200 MG tablet Take 1 tablet (200 mg total) by mouth daily. 30 tablet 11  . aspirin 81 MG tablet Take 81 mg by mouth daily.    Marland Kitchen atorvastatin (LIPITOR) 80 MG tablet Take 1 tablet (80 mg total) by mouth daily at 6  PM. 30 tablet 3  . Calcium-Vitamin D (CALTRATE 600 PLUS-VIT D PO) Take 1 tablet by mouth 2 (two) times daily.     . Darbepoetin Alfa (ARANESP) 500 MCG/ML SOSY injection Inject 500 mcg into the skin every 21 ( twenty-one) days.    . ferrous [id number] C-folic acid (TRINSICON / FOLTRIN) capsule Take 1 capsule by mouth 3 (three) times daily after meals. 90 capsule 1  . lisinopril-hydrochlorothiazide (PRINZIDE,ZESTORETIC) 20-25 MG per tablet Take 1 tablet by mouth daily. TAKE 1/2 TAB BY MOUTH DAILY  1  . loratadine (CLARITIN) 10 MG tablet Take 10 mg by mouth daily.    . metoprolol (LOPRESSOR) 50 MG tablet take 1/2 tablet by mouth twice a day 30 tablet 6  . NITROSTAT 0.4 MG SL tablet Place 0.4 mg under the tongue every 5 (five) minutes as needed.   0  . Omega-3 Fatty Acids (FISH OIL) 1000 MG CAPS Take 2,000 mg by mouth 2 (two) times daily.    . predniSONE (DELTASONE) 5 MG tablet Take 10 mg by mouth daily.     . traMADol (ULTRAM) 50 MG tablet Take 25 mg by mouth every 4 (four) hours as needed for pain.      No current facility-administered medications for this visit.    No Known Allergies  Review of Systems  Improving strength and appetite Anxious to increase his activity level and to start driving No symptoms of depression or heart disease  BP 103/60 mmHg  Pulse 60  Resp 20  Ht 5\' 8"  (1.727 m)  Wt 187 lb (84.823 kg)  BMI 28.44 kg/m2  SpO2 96% Physical Exam Alert and comfortable Lungs clear Sternal incision well-healed and stable Right leg incision and the vein harvest site with a suture abscess which was cleaned opened under sterile technique and the suture not removed. No pedal edema Neuro intact  Diagnostic Tests: Chest x-ray personally reviewed showing clear lung fields, status post CABG, no pleural effusion, sternal wires intact  Impression: Doing well one month after surgery The patient will continue his current medications. We will refer him to phase II cardiac  rehabilitation at [facility] The patient understands he can drive lift up to 20 pounds maximum but nothing more until 3 months after surgery.  Plan:return as needed   Len Childs, MD [facility] [phone]

## 2015-02-27 ENCOUNTER — Ambulatory Visit (HOSPITAL_BASED_OUTPATIENT_CLINIC_OR_DEPARTMENT_OTHER): Payer: Medicare Other

## 2015-02-27 ENCOUNTER — Other Ambulatory Visit (HOSPITAL_BASED_OUTPATIENT_CLINIC_OR_DEPARTMENT_OTHER): Payer: Medicare Other

## 2015-02-27 DIAGNOSIS — D4620 Refractory anemia with excess of blasts, unspecified: Secondary | ICD-10-CM

## 2015-02-27 DIAGNOSIS — D630 Anemia in neoplastic disease: Secondary | ICD-10-CM

## 2015-02-27 DIAGNOSIS — D696 Thrombocytopenia, unspecified: Secondary | ICD-10-CM

## 2015-02-27 LAB — CBC WITH DIFFERENTIAL/PLATELET
BASO%: 0.2 % (ref 0.0–2.0)
Basophils Absolute: 0 10e3/uL (ref 0.0–0.1)
EOS%: 0 % (ref 0.0–7.0)
Eosinophils Absolute: 0 10e3/uL (ref 0.0–0.5)
HCT: 26.7 % — ABNORMAL LOW (ref 38.4–49.9)
HGB: 8.3 g/dL — ABNORMAL LOW (ref 13.0–17.1)
LYMPH%: 28.5 % (ref 14.0–49.0)
MCH: 24.8 pg — ABNORMAL LOW (ref 27.2–33.4)
MCHC: 31.1 g/dL — ABNORMAL LOW (ref 32.0–36.0)
MCV: 79.7 fL (ref 79.3–98.0)
MONO#: 0.8 10e3/uL (ref 0.1–0.9)
MONO%: 12.5 % (ref 0.0–14.0)
NEUT#: 3.5 10e3/uL (ref 1.5–6.5)
NEUT%: 58.8 % (ref 39.0–75.0)
Platelets: 75 10e3/uL — ABNORMAL LOW (ref 140–400)
RBC: 3.35 10e6/uL — ABNORMAL LOW (ref 4.20–5.82)
RDW: 25.5 % — ABNORMAL HIGH (ref 11.0–14.6)
WBC: 6 10e3/uL (ref 4.0–10.3)
lymph#: 1.7 10e3/uL (ref 0.9–3.3)
nRBC: 0 % (ref 0–0)

## 2015-02-27 LAB — TECHNOLOGIST REVIEW

## 2015-02-27 LAB — HOLD TUBE, BLOOD BANK

## 2015-02-27 MED ORDER — DARBEPOETIN ALFA 500 MCG/ML IJ SOSY
500.0000 ug | PREFILLED_SYRINGE | Freq: Once | INTRAMUSCULAR | Status: AC
  Administered 2015-02-27: 500 ug via SUBCUTANEOUS
  Filled 2015-02-27: qty 1

## 2015-02-28 ENCOUNTER — Ambulatory Visit
Admission: RE | Admit: 2015-02-28 | Discharge: 2015-02-28 | Disposition: A | Discharge status: Home / Self Care | Payer: Medicare Other | Source: Ambulatory Visit | Attending: Family Medicine | Admitting: Family Medicine

## 2015-02-28 DIAGNOSIS — K828 Other specified diseases of gallbladder: Secondary | ICD-10-CM

## 2015-03-06 ENCOUNTER — Encounter (HOSPITAL_COMMUNITY)
Admission: RE | Admit: 2015-03-06 | Discharge: 2015-03-06 | Disposition: A | Discharge status: Home / Self Care | Payer: Medicare Other | Source: Ambulatory Visit | Attending: Interventional Cardiology | Admitting: Interventional Cardiology

## 2015-03-06 NOTE — Progress Notes (Signed)
Cardiac Rehab Medication Review by a Pharmacist  Does the patient  feel that his/her medications are working for him/her?  yes  Has the patient been experiencing any side effects to the medications prescribed?  Yes - Lipitor makes him feel like he has less energy than he did before  Does the patient measure his/her own blood pressure or blood glucose at home?  yes - BP regularly  Does the patient have any problems obtaining medications due to transportation or finances?   no  Understanding of regimen: excellent Understanding of indications: good Potential of compliance: excellent    Pharmacist comments: Patient states excellent understanding and compliance. No issues reported.   Elicia Lamp, PharmD Clinical Pharmacist - Resident Pager [phone] 03/06/2015 8:37 AM

## 2015-03-10 ENCOUNTER — Encounter (HOSPITAL_COMMUNITY)
Admission: RE | Admit: 2015-03-10 | Discharge: 2015-03-10 | Disposition: A | Discharge status: Home / Self Care | Payer: Medicare Other | Source: Ambulatory Visit | Attending: Interventional Cardiology | Admitting: Interventional Cardiology

## 2015-03-10 ENCOUNTER — Ambulatory Visit (HOSPITAL_COMMUNITY): Payer: Medicare Other

## 2015-03-10 DIAGNOSIS — Z951 Presence of aortocoronary bypass graft: Secondary | ICD-10-CM | POA: Insufficient documentation

## 2015-03-10 NOTE — Progress Notes (Signed)
Pt in today for his first day of exercise at the 6:45 cardiac rehab phase II program.  Pt tolerated exercise with no complaints.  Monitor shows sinus brady which is present on his most recent 12 lead ekg no noted ectopy.  Medication list reconciled, denies any barriers to compliance to medications. Psychological Assessment PhQ2 score 0.  Pt denies any depression and generally feels positive about his future. Pt feels supported by his family.  No further interventions needed at this time. Will continue to monitor pt and periodically check in with pt for continued mental well being.  Short tem goals and long term goals are centered around getting stronger.  For pt he would like to physically be able to maintain a large garden as he once did in the past.  Pt has cleared an area and is hopeful that his participation in cardiac rehab will get him back to the status of gardening on a large scale.  Will monitor met progression and adherence to home exercise.  Cautioned pt that he will still need to maintain heavy lifting restrictions until the sternum heals.  Pt verbalized understanding and is in agreement. Cherre Huger, BSN

## 2015-03-12 ENCOUNTER — Encounter (HOSPITAL_COMMUNITY)
Admission: RE | Admit: 2015-03-12 | Discharge: 2015-03-12 | Disposition: A | Discharge status: Home / Self Care | Payer: Medicare Other | Source: Ambulatory Visit | Attending: Interventional Cardiology | Admitting: Interventional Cardiology

## 2015-03-12 ENCOUNTER — Ambulatory Visit (HOSPITAL_COMMUNITY): Payer: Medicare Other

## 2015-03-12 DIAGNOSIS — Z951 Presence of aortocoronary bypass graft: Secondary | ICD-10-CM | POA: Diagnosis not present

## 2015-03-14 ENCOUNTER — Encounter (HOSPITAL_COMMUNITY)
Admission: RE | Admit: 2015-03-14 | Discharge: 2015-03-14 | Disposition: A | Discharge status: Home / Self Care | Payer: Medicare Other | Source: Ambulatory Visit | Attending: Interventional Cardiology | Admitting: Interventional Cardiology

## 2015-03-14 ENCOUNTER — Ambulatory Visit (HOSPITAL_COMMUNITY): Payer: Medicare Other

## 2015-03-14 DIAGNOSIS — Z951 Presence of aortocoronary bypass graft: Secondary | ICD-10-CM | POA: Diagnosis not present

## 2015-03-17 ENCOUNTER — Encounter (HOSPITAL_COMMUNITY)
Admission: RE | Admit: 2015-03-17 | Discharge: 2015-03-17 | Disposition: A | Discharge status: Home / Self Care | Payer: Medicare Other | Source: Ambulatory Visit | Attending: Interventional Cardiology | Admitting: Interventional Cardiology

## 2015-03-17 ENCOUNTER — Telehealth: Payer: Self-pay | Admitting: Hematology and Oncology

## 2015-03-17 ENCOUNTER — Ambulatory Visit (HOSPITAL_COMMUNITY): Payer: Medicare Other

## 2015-03-17 DIAGNOSIS — Z951 Presence of aortocoronary bypass graft: Secondary | ICD-10-CM | POA: Diagnosis not present

## 2015-03-17 NOTE — Telephone Encounter (Signed)
pt called to r/s appt...done....pt ok and aware of d.t °

## 2015-03-18 ENCOUNTER — Ambulatory Visit: Payer: PRIVATE HEALTH INSURANCE | Admitting: Interventional Cardiology

## 2015-03-18 ENCOUNTER — Other Ambulatory Visit: Payer: PRIVATE HEALTH INSURANCE

## 2015-03-19 ENCOUNTER — Ambulatory Visit (HOSPITAL_COMMUNITY): Payer: Medicare Other

## 2015-03-19 ENCOUNTER — Encounter (HOSPITAL_COMMUNITY)
Admission: RE | Admit: 2015-03-19 | Discharge: 2015-03-19 | Disposition: A | Discharge status: Home / Self Care | Payer: Medicare Other | Source: Ambulatory Visit | Attending: Interventional Cardiology | Admitting: Interventional Cardiology

## 2015-03-19 DIAGNOSIS — Z951 Presence of aortocoronary bypass graft: Secondary | ICD-10-CM | POA: Diagnosis not present

## 2015-03-19 NOTE — Progress Notes (Signed)
Quality of Life Survey Results -  Pt with normal survey results. Overall score 22.31 No needs identified.  No further intervention needed. Cherre Huger, BSN

## 2015-03-20 ENCOUNTER — Other Ambulatory Visit (HOSPITAL_BASED_OUTPATIENT_CLINIC_OR_DEPARTMENT_OTHER): Payer: Medicare Other

## 2015-03-20 ENCOUNTER — Ambulatory Visit: Payer: Medicare Other

## 2015-03-20 ENCOUNTER — Ambulatory Visit (HOSPITAL_BASED_OUTPATIENT_CLINIC_OR_DEPARTMENT_OTHER): Payer: Medicare Other

## 2015-03-20 ENCOUNTER — Telehealth: Payer: Self-pay | Admitting: *Deleted

## 2015-03-20 ENCOUNTER — Other Ambulatory Visit: Payer: Medicare Other

## 2015-03-20 ENCOUNTER — Other Ambulatory Visit: Payer: Self-pay | Admitting: Hematology and Oncology

## 2015-03-20 VITALS — BP 116/43 | HR 57 | Temp 98.5°F

## 2015-03-20 DIAGNOSIS — D4620 Refractory anemia with excess of blasts, unspecified: Secondary | ICD-10-CM

## 2015-03-20 DIAGNOSIS — D630 Anemia in neoplastic disease: Secondary | ICD-10-CM

## 2015-03-20 DIAGNOSIS — D696 Thrombocytopenia, unspecified: Secondary | ICD-10-CM

## 2015-03-20 LAB — CBC WITH DIFFERENTIAL/PLATELET
BASO%: 0.2 % (ref 0.0–2.0)
BASOS ABS: 0 10*3/uL (ref 0.0–0.1)
EOS ABS: 0 10*3/uL (ref 0.0–0.5)
EOS%: 0.1 % (ref 0.0–7.0)
HEMATOCRIT: 24.9 % — AB (ref 38.4–49.9)
HEMOGLOBIN: 7.7 g/dL — AB (ref 13.0–17.1)
LYMPH#: 1.4 10*3/uL (ref 0.9–3.3)
LYMPH%: 15.5 % (ref 14.0–49.0)
MCH: 24.3 pg — AB (ref 27.2–33.4)
MCHC: 30.9 g/dL — ABNORMAL LOW (ref 32.0–36.0)
MCV: 78.5 fL — AB (ref 79.3–98.0)
MONO#: 0.8 10*3/uL (ref 0.1–0.9)
MONO%: 8.5 % (ref 0.0–14.0)
NEUT%: 75.7 % — ABNORMAL HIGH (ref 39.0–75.0)
NEUTROS ABS: 6.7 10*3/uL — AB (ref 1.5–6.5)
Platelets: 72 10*3/uL — ABNORMAL LOW (ref 140–400)
RBC: 3.17 10*6/uL — ABNORMAL LOW (ref 4.20–5.82)
RDW: 27.8 % — AB (ref 11.0–14.6)
WBC: 8.9 10*3/uL (ref 4.0–10.3)
nRBC: 0 % (ref 0–0)

## 2015-03-20 LAB — TECHNOLOGIST REVIEW

## 2015-03-20 LAB — HOLD TUBE, BLOOD BANK

## 2015-03-20 MED ORDER — DARBEPOETIN ALFA 500 MCG/ML IJ SOSY
500.0000 ug | PREFILLED_SYRINGE | Freq: Once | INTRAMUSCULAR | Status: AC
  Administered 2015-03-20: 500 ug via SUBCUTANEOUS
  Filled 2015-03-20: qty 1

## 2015-03-20 NOTE — Telephone Encounter (Signed)
Per staff message and POF I have scheduled appts. Advised scheduler of appts. JMW  

## 2015-03-21 ENCOUNTER — Encounter (HOSPITAL_COMMUNITY)
Admission: RE | Admit: 2015-03-21 | Discharge: 2015-03-21 | Disposition: A | Discharge status: Home / Self Care | Payer: Medicare Other | Source: Ambulatory Visit | Attending: Interventional Cardiology | Admitting: Interventional Cardiology

## 2015-03-21 ENCOUNTER — Ambulatory Visit (HOSPITAL_COMMUNITY): Payer: Medicare Other

## 2015-03-21 DIAGNOSIS — Z951 Presence of aortocoronary bypass graft: Secondary | ICD-10-CM | POA: Diagnosis not present

## 2015-03-24 ENCOUNTER — Ambulatory Visit (HOSPITAL_COMMUNITY): Payer: Medicare Other

## 2015-03-24 ENCOUNTER — Encounter (HOSPITAL_COMMUNITY)
Admission: RE | Admit: 2015-03-24 | Discharge: 2015-03-24 | Disposition: A | Discharge status: Home / Self Care | Payer: Medicare Other | Source: Ambulatory Visit | Attending: Interventional Cardiology | Admitting: Interventional Cardiology

## 2015-03-24 DIAGNOSIS — Z951 Presence of aortocoronary bypass graft: Secondary | ICD-10-CM | POA: Diagnosis not present

## 2015-03-24 NOTE — Progress Notes (Signed)
Reviewed home exercise guidelines with patient including endpoints, temperature precautions, target heart rate and rate of perceived exertion. Pt plans to exercise in the Silver [employer]ne[facility] program at the [facility] and walk outside as his mode of home exercise, . Pt voices understanding of instructions given. Shirlyn Goltz, Academic Intern  Sol Passer, MS, ACSM CCEP

## 2015-03-26 ENCOUNTER — Ambulatory Visit (HOSPITAL_COMMUNITY): Payer: Medicare Other

## 2015-03-26 ENCOUNTER — Encounter (HOSPITAL_COMMUNITY): Payer: Medicare Other

## 2015-03-28 ENCOUNTER — Ambulatory Visit (HOSPITAL_COMMUNITY): Payer: Medicare Other

## 2015-03-28 ENCOUNTER — Encounter (HOSPITAL_COMMUNITY)
Admission: RE | Admit: 2015-03-28 | Discharge: 2015-03-28 | Disposition: A | Discharge status: Home / Self Care | Payer: Medicare Other | Source: Ambulatory Visit | Attending: Interventional Cardiology | Admitting: Interventional Cardiology

## 2015-03-28 DIAGNOSIS — Z951 Presence of aortocoronary bypass graft: Secondary | ICD-10-CM | POA: Diagnosis not present

## 2015-03-28 NOTE — Progress Notes (Signed)
Eric [name] 69 y.o. male Nutrition Note Spoke with pt.  Nutrition Survey reviewed with pt. Pt is following Step 2 of the Therapeutic Lifestyle Changes diet. Pt wants to lose wt. Pt reports he has lost wt from his pre-surgery wt of 200-205 lb. Pt's current wt is 83.5 kg (183.7 lb), which is down 16-21 lb over the past 4 months. Pt's goal wt is 165-170 lb. Pt has been trying to lose wt by changing his diet. Wt loss tips reviewed. Pt expressed understanding of the information reviewed. Pt aware of nutrition education classes offered.  Nutrition Diagnosis ? Food-and nutrition-related knowledge deficit related to lack of exposure to information as related to diagnosis of: ? CVD ? Overweight related to excessive energy intake as evidenced by a BMI of 28.3  Nutrition Intervention ? Benefits of adopting Therapeutic Lifestyle Changes discussed when Medficts reviewed. ? Pt to attend the Portion Distortion class ? Pt to attend the  ? Nutrition I class                     ? Nutrition II class ? Pt given handouts for: ? Nutrition I class ? Nutrition II class ? Continue client-centered nutrition education by RD, as part of interdisciplinary care.  Goal(s) ? Pt to identify food quantities necessary to achieve: ? wt loss to a goal wt of 165-180 lb (75-81.6 kg) at graduation from cardiac rehab.   Monitor and Evaluate progress toward nutrition goal with team.  Derek Mound, M.Ed, RD, LDN, CDE 03/28/2015 7:50 AM

## 2015-03-31 ENCOUNTER — Ambulatory Visit (HOSPITAL_COMMUNITY): Payer: Medicare Other

## 2015-03-31 ENCOUNTER — Encounter (HOSPITAL_COMMUNITY)
Admission: RE | Admit: 2015-03-31 | Discharge: 2015-03-31 | Disposition: A | Discharge status: Home / Self Care | Payer: Medicare Other | Source: Ambulatory Visit | Attending: Interventional Cardiology | Admitting: Interventional Cardiology

## 2015-03-31 DIAGNOSIS — Z951 Presence of aortocoronary bypass graft: Secondary | ICD-10-CM | POA: Diagnosis not present

## 2015-04-02 ENCOUNTER — Encounter (HOSPITAL_COMMUNITY)
Admission: RE | Admit: 2015-04-02 | Discharge: 2015-04-02 | Disposition: A | Discharge status: Home / Self Care | Payer: Medicare Other | Source: Ambulatory Visit | Attending: Interventional Cardiology | Admitting: Interventional Cardiology

## 2015-04-02 ENCOUNTER — Ambulatory Visit (HOSPITAL_COMMUNITY): Payer: Medicare Other

## 2015-04-02 DIAGNOSIS — Z951 Presence of aortocoronary bypass graft: Secondary | ICD-10-CM | POA: Diagnosis not present

## 2015-04-03 ENCOUNTER — Ambulatory Visit (HOSPITAL_COMMUNITY)
Admission: RE | Admit: 2015-04-03 | Discharge: 2015-04-03 | Disposition: A | Discharge status: Home / Self Care | Payer: Medicare Other | Source: Ambulatory Visit | Attending: Hematology and Oncology | Admitting: Hematology and Oncology

## 2015-04-03 ENCOUNTER — Telehealth: Payer: Self-pay | Admitting: Hematology and Oncology

## 2015-04-03 ENCOUNTER — Ambulatory Visit (HOSPITAL_BASED_OUTPATIENT_CLINIC_OR_DEPARTMENT_OTHER): Payer: Medicare Other | Admitting: Hematology and Oncology

## 2015-04-03 ENCOUNTER — Ambulatory Visit (HOSPITAL_BASED_OUTPATIENT_CLINIC_OR_DEPARTMENT_OTHER): Payer: Medicare Other

## 2015-04-03 ENCOUNTER — Encounter: Payer: Self-pay | Admitting: Hematology and Oncology

## 2015-04-03 VITALS — BP 114/59 | HR 47 | Temp 98.0°F | Resp 17

## 2015-04-03 VITALS — BP 114/45 | HR 55 | Temp 97.7°F | Resp 17 | Ht 68.0 in | Wt 183.5 lb

## 2015-04-03 DIAGNOSIS — D630 Anemia in neoplastic disease: Secondary | ICD-10-CM | POA: Insufficient documentation

## 2015-04-03 DIAGNOSIS — R001 Bradycardia, unspecified: Secondary | ICD-10-CM | POA: Diagnosis not present

## 2015-04-03 DIAGNOSIS — D696 Thrombocytopenia, unspecified: Secondary | ICD-10-CM

## 2015-04-03 DIAGNOSIS — D4620 Refractory anemia with excess of blasts, unspecified: Secondary | ICD-10-CM | POA: Diagnosis present

## 2015-04-03 DIAGNOSIS — T50905A Adverse effect of unspecified drugs, medicaments and biological substances, initial encounter: Secondary | ICD-10-CM

## 2015-04-03 DIAGNOSIS — M069 Rheumatoid arthritis, unspecified: Secondary | ICD-10-CM | POA: Diagnosis not present

## 2015-04-03 LAB — CBC WITH DIFFERENTIAL/PLATELET
BASO%: 0.1 % (ref 0.0–2.0)
BASOS ABS: 0 10*3/uL (ref 0.0–0.1)
EOS%: 0 % (ref 0.0–7.0)
Eosinophils Absolute: 0 10*3/uL (ref 0.0–0.5)
HCT: 24.6 % — ABNORMAL LOW (ref 38.4–49.9)
HEMOGLOBIN: 7.6 g/dL — AB (ref 13.0–17.1)
LYMPH%: 17.1 % (ref 14.0–49.0)
MCH: 24.4 pg — AB (ref 27.2–33.4)
MCHC: 30.9 g/dL — ABNORMAL LOW (ref 32.0–36.0)
MCV: 78.8 fL — ABNORMAL LOW (ref 79.3–98.0)
MONO#: 1.1 10*3/uL — ABNORMAL HIGH (ref 0.1–0.9)
MONO%: 12.6 % (ref 0.0–14.0)
NEUT#: 6 10*3/uL (ref 1.5–6.5)
NEUT%: 70.2 % (ref 39.0–75.0)
RBC: 3.12 10*6/uL — AB (ref 4.20–5.82)
RDW: 28.4 % — AB (ref 11.0–14.6)
WBC: 8.6 10*3/uL (ref 4.0–10.3)
lymph#: 1.5 10*3/uL (ref 0.9–3.3)
nRBC: 0 % (ref 0–0)

## 2015-04-03 LAB — TECHNOLOGIST REVIEW: Technologist Review: 3

## 2015-04-03 LAB — HOLD TUBE, BLOOD BANK

## 2015-04-03 MED ORDER — ACETAMINOPHEN 325 MG PO TABS
ORAL_TABLET | ORAL | Status: AC
  Filled 2015-04-03: qty 2

## 2015-04-03 MED ORDER — ACETAMINOPHEN 325 MG PO TABS
650.0000 mg | ORAL_TABLET | Freq: Once | ORAL | Status: AC
  Administered 2015-04-03: 650 mg via ORAL

## 2015-04-03 MED ORDER — SODIUM CHLORIDE 0.9 % IV SOLN
250.0000 mL | Freq: Once | INTRAVENOUS | Status: AC
  Administered 2015-04-03: 250 mL via INTRAVENOUS

## 2015-04-03 NOTE — Assessment & Plan Note (Signed)
He has significant drop off his platelet count. I suspect it could be a progression of his bone marrow disease. I recommend a trial of increasing the dose of prednisone. In the meantime, I told him to hold off taking aspirin therapy until his platelet count rebound to greater than 50,000.

## 2015-04-03 NOTE — Progress Notes (Signed)
[place] OFFICE PROGRESS NOTE  Patient Care Team: Jonathon Jordan, MD as PCP - General (Family Medicine) Ronald Lobo, MD (Gastroenterology) Jonathon Jordan, MD as Attending Physician (Family Medicine) Heath Lark, MD as Consulting Physician (Hematology and Oncology)  SUMMARY OF ONCOLOGIC HISTORY:   MDS (myelodysplastic syndrome), low grade    Initial Diagnosis MDS (myelodysplastic syndrome), low grade   04/26/2013 Bone Marrow Biopsy BM biopsy confirmed MDS with multilineage dysplasia, 4% blast, normal cytogenetics.    INTERVAL HISTORY: Please see below for problem oriented charting. He returns for further follow-up. He complained of persistent fatigue and occasional lightheadedness. He bruises easily. He is undergoing tremendous stress looking after his wife who recently fell and has sustained major fracture in her right upper extremity. The patient denies any recent signs or symptoms of bleeding such as spontaneous epistaxis, hematuria or hematochezia.   REVIEW OF SYSTEMS:   Constitutional: Denies fevers, chills or abnormal weight loss Eyes: Denies blurriness of vision Ears, nose, mouth, throat, and face: Denies mucositis or sore throat Respiratory: Denies cough, dyspnea or wheezes Cardiovascular: Denies palpitation, chest discomfort or lower extremity swelling Gastrointestinal:  Denies nausea, heartburn or change in bowel habits Skin: Denies abnormal skin rashes Lymphatics: Denies new lymphadenopathy  Neurological:Denies numbness, tingling  Behavioral/Psych: Mood is stable, no new changes  All other systems were reviewed with the patient and are negative.  I have reviewed the past medical history, past surgical history, social history and family history with the patient and they are unchanged from previous note.  ALLERGIES:  has No Known Allergies.  MEDICATIONS:  Current Outpatient Prescriptions  Medication Sig Dispense Refill  . acetaminophen (TYLENOL)  500 MG tablet Take 1,000 mg by mouth every 6 (six) hours as needed for pain.    Marland Kitchen alendronate (FOSAMAX) 70 MG tablet Take 70 mg by mouth once a week. On Friday. Take with a full glass of water on an empty stomach.    Marland Kitchen allopurinol (ZYLOPRIM) 100 MG tablet Take 100 mg by mouth daily.    Marland Kitchen ALPRAZolam (XANAX) 0.25 MG tablet Take 0.125 mg by mouth 2 (two) times daily as needed for anxiety.   0  . amiodarone (PACERONE) 200 MG tablet Take 1 tablet (200 mg total) by mouth daily. 30 tablet 11  . aspirin 81 MG tablet Take 81 mg by mouth daily.    Marland Kitchen atorvastatin (LIPITOR) 80 MG tablet Take 1 tablet (80 mg total) by mouth daily at 6 PM. 30 tablet 3  . Calcium-Vitamin D (CALTRATE 600 PLUS-VIT D PO) Take 1 tablet by mouth 2 (two) times daily.     . Darbepoetin Alfa (ARANESP) 500 MCG/ML SOSY injection Inject 500 mcg into the skin every 21 ( twenty-one) days.    Marland Kitchen lisinopril-hydrochlorothiazide (PRINZIDE,ZESTORETIC) 20-25 MG per tablet Take 1 tablet by mouth daily. TAKE 1/2 TAB BY MOUTH DAILY  1  . loratadine (CLARITIN) 10 MG tablet Take 10 mg by mouth daily.    . metoprolol (LOPRESSOR) 50 MG tablet take 1/2 tablet by mouth twice a day 30 tablet 6  . Multiple Vitamins-Minerals (MULTIVITAMIN ADULT PO) Take 2 tablets by mouth every morning.    . Omega-3 Fatty Acids (FISH OIL) 1000 MG CAPS Take 2,000 mg by mouth 2 (two) times daily.    . predniSONE (DELTASONE) 5 MG tablet Take 7.5 mg by mouth daily.     . traMADol (ULTRAM) 50 MG tablet Take 25 mg by mouth every 4 (four) hours as needed for pain.     Marland Kitchen  NITROSTAT 0.4 MG SL tablet Place 0.4 mg under the tongue every 5 (five) minutes as needed.   0  . TL ICON capsule   0   No current facility-administered medications for this visit.    PHYSICAL EXAMINATION: ECOG PERFORMANCE STATUS: 1 - Symptomatic but completely ambulatory  Filed Vitals:   04/03/15 0904  BP: 114/45  Pulse: 55  Temp: 97.7 F (36.5 C)  Resp: 17   Filed Weights   04/03/15 0904  Weight: 183  lb 8 oz (83.235 kg)    GENERAL:alert, no distress and comfortable. He is mildly cushingoid SKIN: skin color, texture, turgor are normal, no rashes or significant lesions. Noted mild bruising EYES: normal, Conjunctiva are pink and non-injected, sclera clear OROPHARYNX:no exudate, no erythema and lips, buccal mucosa, and tongue normal  NECK: supple, thyroid normal size, non-tender, without nodularity LYMPH:  no palpable lymphadenopathy in the cervical, axillary or inguinal LUNGS: clear to auscultation and percussion with normal breathing effort HEART: regular rate & rhythm with bradycardia no murmurs and no lower extremity edema ABDOMEN:abdomen soft, non-tender and normal bowel sounds Musculoskeletal:no cyanosis of digits and no clubbing  NEURO: alert & oriented x 3 with fluent speech, no focal motor/sensory deficits  LABORATORY DATA:  I have reviewed the data as listed    Component Value Date/Time   NA 136 02/03/2015 1219   NA 138 05/30/2014 1138   K 4.6 02/03/2015 1219   K 3.7 05/30/2014 1138   CL 103 02/03/2015 1219   CL 102 04/09/2013 1347   CO2 27 02/03/2015 1219   CO2 24 05/30/2014 1138   GLUCOSE 109* 02/03/2015 1219   GLUCOSE 147* 05/30/2014 1138   GLUCOSE 113* 04/09/2013 1347   BUN 32* 02/03/2015 1219   BUN 26.2* 05/30/2014 1138   CREATININE 1.00 02/03/2015 1219   CREATININE 0.8 05/30/2014 1138   CALCIUM 9.7 02/03/2015 1219   CALCIUM 9.8 05/30/2014 1138   PROT 6.2 01/18/2015 0300   PROT 7.4 11/22/2013 0847   ALBUMIN 2.8* 01/18/2015 0300   ALBUMIN 4.1 11/22/2013 0847   AST 24 01/18/2015 0300   AST 15 11/22/2013 0847   ALT 18 01/18/2015 0300   ALT 15 11/22/2013 0847   ALKPHOS 54 01/18/2015 0300   ALKPHOS 48 11/22/2013 0847   BILITOT 0.9 01/18/2015 0300   BILITOT 1.15 11/22/2013 0847   GFRNONAA 83* 01/21/2015 0406   GFRAA >90 01/21/2015 0406    No results found for: SPEP, UPEP  Lab Results  Component Value Date   WBC 8.9 03/20/2015   NEUTROABS 6.7*  03/20/2015   HGB 7.7* 03/20/2015   HCT 24.9* 03/20/2015   MCV 78.5* 03/20/2015   PLT 72* 03/20/2015      Chemistry      Component Value Date/Time   NA 136 02/03/2015 1219   NA 138 05/30/2014 1138   K 4.6 02/03/2015 1219   K 3.7 05/30/2014 1138   CL 103 02/03/2015 1219   CL 102 04/09/2013 1347   CO2 27 02/03/2015 1219   CO2 24 05/30/2014 1138   BUN 32* 02/03/2015 1219   BUN 26.2* 05/30/2014 1138   CREATININE 1.00 02/03/2015 1219   CREATININE 0.8 05/30/2014 1138      Component Value Date/Time   CALCIUM 9.7 02/03/2015 1219   CALCIUM 9.8 05/30/2014 1138   ALKPHOS 54 01/18/2015 0300   ALKPHOS 48 11/22/2013 0847   AST 24 01/18/2015 0300   AST 15 11/22/2013 0847   ALT 18 01/18/2015 0300   ALT 15 11/22/2013  0847   BILITOT 0.9 01/18/2015 0300   BILITOT 1.15 11/22/2013 0847      ASSESSMENT & PLAN:  MDS (myelodysplastic syndrome), low grade He has progressive anemia and thrombocytopenia. I reviewed his peripheral blood smear which look quite abnormal with fragmented red blood cells and absolute thrombocytopenia. 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 I think we need to be looking at treatment fairly soon. I recommend increasing his prednisone to 15 mg daily and reassess next week. If it does not work, we might have to repeat bone marrow aspirate and biopsy. He agreed with the plan.   Rheumatoid arthritis He has chronic joint pain.  He has taken chronic prednisone for this. I recommend increasing it to 15 mg for now.     Anemia in neoplastic disease We discussed some of the risks, benefits, and alternatives of blood transfusions. The patient is symptomatic from anemia and the hemoglobin level is critically low.  Some of the side-effects to be expected including risks of transfusion reactions, chills, infection, syndrome of volume overload and risk of hospitalization from various reasons and the patient is willing to  proceed and went ahead to sign consent today. I will proceed with 1 unit of blood. We also discussed about the possibility of increasing the frequency of darbepoetin to every 2 weeks. He will like to think about it. I recommend a trial of increased dose prednisone and we will reassess again next week.   Thrombocytopenia He has significant drop off his platelet count. I suspect it could be a progression of his bone marrow disease. I recommend a trial of increasing the dose of prednisone. In the meantime, I told him to hold off taking aspirin therapy until his platelet count rebound to greater than 50,000.   Bradycardia, drug induced He is symptomatic with weakness and have persistent bradycardia. He has cardiology appointment pending next month for medication adjustment.    No orders of the defined types were placed in this encounter.   All questions were answered. The patient knows to call the clinic with any problems, questions or concerns. No barriers to learning was detected. I spent 30 minutes counseling the patient face to face. The total time spent in the appointment was 40 minutes and more than 50% was on counseling and review of test results     [facility], [place], MD 04/03/2015 2:47 PM

## 2015-04-03 NOTE — Assessment & Plan Note (Signed)
He has chronic joint pain.  He has taken chronic prednisone for this. I recommend increasing it to 15 mg for now.

## 2015-04-03 NOTE — Assessment & Plan Note (Signed)
He has progressive anemia and thrombocytopenia. I reviewed his peripheral blood smear which look quite abnormal with fragmented red blood cells and absolute thrombocytopenia. 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 I think we need to be looking at treatment fairly soon. I recommend increasing his prednisone to 15 mg daily and reassess next week. If it does not work, we might have to repeat bone marrow aspirate and biopsy. He agreed with the plan.

## 2015-04-03 NOTE — Telephone Encounter (Signed)
Gave avs & calendar for May thru October.

## 2015-04-03 NOTE — Assessment & Plan Note (Signed)
We discussed some of the risks, benefits, and alternatives of blood transfusions. The patient is symptomatic from anemia and the hemoglobin level is critically low.  Some of the side-effects to be expected including risks of transfusion reactions, chills, infection, syndrome of volume overload and risk of hospitalization from various reasons and the patient is willing to proceed and went ahead to sign consent today. I will proceed with 1 unit of blood. We also discussed about the possibility of increasing the frequency of darbepoetin to every 2 weeks. He will like to think about it. I recommend a trial of increased dose prednisone and we will reassess again next week.

## 2015-04-03 NOTE — Assessment & Plan Note (Signed)
He is symptomatic with weakness and have persistent bradycardia. He has cardiology appointment pending next month for medication adjustment.

## 2015-04-04 ENCOUNTER — Encounter (HOSPITAL_COMMUNITY): Payer: Medicare Other

## 2015-04-04 ENCOUNTER — Ambulatory Visit (HOSPITAL_COMMUNITY): Payer: Medicare Other

## 2015-04-04 LAB — TYPE AND SCREEN
ABO/RH(D): O POS
Antibody Screen: NEGATIVE
Unit division: 0

## 2015-04-07 ENCOUNTER — Ambulatory Visit (HOSPITAL_COMMUNITY): Payer: Medicare Other

## 2015-04-07 ENCOUNTER — Encounter (HOSPITAL_COMMUNITY)
Admission: RE | Admit: 2015-04-07 | Discharge: 2015-04-07 | Disposition: A | Discharge status: Home / Self Care | Payer: Medicare Other | Source: Ambulatory Visit | Attending: Interventional Cardiology | Admitting: Interventional Cardiology

## 2015-04-07 DIAGNOSIS — Z951 Presence of aortocoronary bypass graft: Secondary | ICD-10-CM | POA: Insufficient documentation

## 2015-04-08 ENCOUNTER — Telehealth: Payer: Self-pay | Admitting: Hematology and Oncology

## 2015-04-08 NOTE — Telephone Encounter (Signed)
Patient called re lab/inj appointment 5/5. Per patient inj was not added. Checked last pof (04/03/15) added inj for 5/5. Patient given new time for lab/inj 5/5 @ 9:45am.

## 2015-04-09 ENCOUNTER — Ambulatory Visit (HOSPITAL_COMMUNITY): Payer: Medicare Other

## 2015-04-09 ENCOUNTER — Encounter (HOSPITAL_COMMUNITY)
Admission: RE | Admit: 2015-04-09 | Discharge: 2015-04-09 | Disposition: A | Discharge status: Home / Self Care | Payer: Medicare Other | Source: Ambulatory Visit | Attending: Interventional Cardiology | Admitting: Interventional Cardiology

## 2015-04-09 DIAGNOSIS — Z951 Presence of aortocoronary bypass graft: Secondary | ICD-10-CM | POA: Diagnosis not present

## 2015-04-10 ENCOUNTER — Ambulatory Visit (HOSPITAL_BASED_OUTPATIENT_CLINIC_OR_DEPARTMENT_OTHER): Payer: Medicare Other

## 2015-04-10 ENCOUNTER — Other Ambulatory Visit (HOSPITAL_BASED_OUTPATIENT_CLINIC_OR_DEPARTMENT_OTHER): Payer: Medicare Other

## 2015-04-10 VITALS — BP 115/36 | HR 48 | Temp 98.0°F | Resp 18

## 2015-04-10 DIAGNOSIS — D630 Anemia in neoplastic disease: Secondary | ICD-10-CM

## 2015-04-10 DIAGNOSIS — D46Z Other myelodysplastic syndromes: Secondary | ICD-10-CM

## 2015-04-10 DIAGNOSIS — D4620 Refractory anemia with excess of blasts, unspecified: Secondary | ICD-10-CM

## 2015-04-10 DIAGNOSIS — D696 Thrombocytopenia, unspecified: Secondary | ICD-10-CM

## 2015-04-10 LAB — CBC WITH DIFFERENTIAL/PLATELET
BASO%: 0.2 % (ref 0.0–2.0)
BASOS ABS: 0 10*3/uL (ref 0.0–0.1)
EOS ABS: 0 10*3/uL (ref 0.0–0.5)
EOS%: 0 % (ref 0.0–7.0)
HCT: 26.7 % — ABNORMAL LOW (ref 38.4–49.9)
HEMOGLOBIN: 8.5 g/dL — AB (ref 13.0–17.1)
LYMPH#: 2.1 10*3/uL (ref 0.9–3.3)
LYMPH%: 17.9 % (ref 14.0–49.0)
MCH: 25 pg — ABNORMAL LOW (ref 27.2–33.4)
MCHC: 31.8 g/dL — ABNORMAL LOW (ref 32.0–36.0)
MCV: 78.5 fL — ABNORMAL LOW (ref 79.3–98.0)
MONO#: 1 10*3/uL — ABNORMAL HIGH (ref 0.1–0.9)
MONO%: 8.6 % (ref 0.0–14.0)
NEUT#: 8.8 10*3/uL — ABNORMAL HIGH (ref 1.5–6.5)
NEUT%: 73.3 % (ref 39.0–75.0)
Platelets: 61 10*3/uL — ABNORMAL LOW (ref 140–400)
RBC: 3.4 10*6/uL — ABNORMAL LOW (ref 4.20–5.82)
RDW: 27.6 % — AB (ref 11.0–14.6)
WBC: 11.9 10*3/uL — AB (ref 4.0–10.3)
nRBC: 0 % (ref 0–0)

## 2015-04-10 LAB — TECHNOLOGIST REVIEW

## 2015-04-10 LAB — HOLD TUBE, BLOOD BANK

## 2015-04-10 MED ORDER — DARBEPOETIN ALFA 500 MCG/ML IJ SOSY
500.0000 ug | PREFILLED_SYRINGE | Freq: Once | INTRAMUSCULAR | Status: AC
  Administered 2015-04-10: 500 ug via SUBCUTANEOUS
  Filled 2015-04-10: qty 1

## 2015-04-10 NOTE — Patient Instructions (Signed)
Darbepoetin Alfa injection What is this medicine? DARBEPOETIN ALFA (dar be POE e tin AL fa) helps your body make more red blood cells. It is used to treat anemia caused by chronic kidney failure and chemotherapy. This medicine may be used for other purposes; ask your health care provider or pharmacist if you have questions. COMMON BRAND NAME(S): Aranesp What should I tell my health care provider before I take this medicine? They need to know if you have any of these conditions: -blood clotting disorders or history of blood clots -cancer patient not on chemotherapy -cystic fibrosis -heart disease, such as angina, heart failure, or a history of a heart attack -hemoglobin level of 12 g/dL or greater -high blood pressure -low levels of folate, iron, or vitamin B12 -seizures -an unusual or allergic reaction to darbepoetin, erythropoietin, albumin, hamster proteins, latex, other medicines, foods, dyes, or preservatives -pregnant or trying to get pregnant -breast-feeding How should I use this medicine? This medicine is for injection into a vein or under the skin. It is usually given by a health care professional in a hospital or clinic setting. If you get this medicine at home, you will be taught how to prepare and give this medicine. Do not shake the solution before you withdraw a dose. Use exactly as directed. Take your medicine at regular intervals. Do not take your medicine more often than directed. It is important that you put your used needles and syringes in a special sharps container. Do not put them in a trash can. If you do not have a sharps container, call your pharmacist or healthcare provider to get one. Talk to your pediatrician regarding the use of this medicine in children. While this medicine may be used in children as young as 1 year for selected conditions, precautions do apply. Overdosage: If you think you have taken too much of this medicine contact a poison control center or  emergency room at once. NOTE: This medicine is only for you. Do not share this medicine with others. What if I miss a dose? If you miss a dose, take it as soon as you can. If it is almost time for your next dose, take only that dose. Do not take double or extra doses. What may interact with this medicine? Do not take this medicine with any of the following medications: -epoetin alfa This list may not describe all possible interactions. Give your health care provider a list of all the medicines, herbs, non-prescription drugs, or dietary supplements you use. Also tell them if you smoke, drink alcohol, or use illegal drugs. Some items may interact with your medicine. What should I watch for while using this medicine? Visit your prescriber or health care professional for regular checks on your progress and for the needed blood tests and blood pressure measurements. It is especially important for the doctor to make sure your hemoglobin level is in the desired range, to limit the risk of potential side effects and to give you the best benefit. Keep all appointments for any recommended tests. Check your blood pressure as directed. Ask your doctor what your blood pressure should be and when you should contact him or her. As your body makes more red blood cells, you may need to take iron, folic acid, or vitamin B supplements. Ask your doctor or health care provider which products are right for you. If you have kidney disease continue dietary restrictions, even though this medication can make you feel better. Talk with your doctor or health   care professional about the foods you eat and the vitamins that you take. What side effects may I notice from receiving this medicine? Side effects that you should report to your doctor or health care professional as soon as possible: -allergic reactions like skin rash, itching or hives, swelling of the face, lips, or tongue -breathing problems -changes in vision -chest  pain -confusion, trouble speaking or understanding -feeling faint or lightheaded, falls -high blood pressure -muscle aches or pains -pain, swelling, warmth in the leg -rapid weight gain -severe headaches -sudden numbness or weakness of the face, arm or leg -trouble walking, dizziness, loss of balance or coordination -seizures (convulsions) -swelling of the ankles, feet, hands -unusually weak or tired Side effects that usually do not require medical attention (report to your doctor or health care professional if they continue or are bothersome): -diarrhea -fever, chills (flu-like symptoms) -headaches -nausea, vomiting -redness, stinging, or swelling at site where injected This list may not describe all possible side effects. Call your doctor for medical advice about side effects. You may report side effects to FDA at 1-800-FDA-1088. Where should I keep my medicine? Keep out of the reach of children. Store in a refrigerator between 2 and 8 degrees C (36 and 46 degrees F). Do not freeze. Do not shake. Throw away any unused portion if using a single-dose vial. Throw away any unused medicine after the expiration date. NOTE: This sheet is a summary. It may not cover all possible information. If you have questions about this medicine, talk to your doctor, pharmacist, or health care provider.  2015, Elsevier/Gold Standard. (2008-11-05 10:23:57)  

## 2015-04-11 ENCOUNTER — Encounter (HOSPITAL_COMMUNITY)
Admission: RE | Admit: 2015-04-11 | Discharge: 2015-04-11 | Disposition: A | Discharge status: Home / Self Care | Payer: Medicare Other | Source: Ambulatory Visit | Attending: Interventional Cardiology | Admitting: Interventional Cardiology

## 2015-04-11 ENCOUNTER — Ambulatory Visit (HOSPITAL_COMMUNITY): Payer: Medicare Other

## 2015-04-11 DIAGNOSIS — Z951 Presence of aortocoronary bypass graft: Secondary | ICD-10-CM | POA: Diagnosis not present

## 2015-04-14 ENCOUNTER — Encounter (HOSPITAL_COMMUNITY)
Admission: RE | Admit: 2015-04-14 | Discharge: 2015-04-14 | Disposition: A | Discharge status: Home / Self Care | Payer: Medicare Other | Source: Ambulatory Visit | Attending: Interventional Cardiology | Admitting: Interventional Cardiology

## 2015-04-14 ENCOUNTER — Ambulatory Visit (HOSPITAL_COMMUNITY): Payer: Medicare Other

## 2015-04-14 DIAGNOSIS — Z951 Presence of aortocoronary bypass graft: Secondary | ICD-10-CM | POA: Diagnosis not present

## 2015-04-14 NOTE — Progress Notes (Signed)
Pt in for cardiac rehab today.  Pt with vague complaints of belly pain.  Pt with known history of gall stones and was referred to surgeon which pt declined due to recovering from heart surgery and wife planned to have shoulder surgery in the near future. Pt ate out last evening at [employer] ate heavier than usual.  Pt will see Dr. Irish Lack on next Monday.  Pt feels this pain is not like his "heart pain" before he had his CABG.  Pt advised that he will need to see his primary MD - Dr. Stephanie Acre for the next step in the plan of care concerning his gall bladder.  Eric [name], Eric [name]

## 2015-04-16 ENCOUNTER — Encounter (HOSPITAL_COMMUNITY)
Admission: RE | Admit: 2015-04-16 | Discharge: 2015-04-16 | Disposition: A | Discharge status: Home / Self Care | Payer: Medicare Other | Source: Ambulatory Visit | Attending: Interventional Cardiology | Admitting: Interventional Cardiology

## 2015-04-16 ENCOUNTER — Ambulatory Visit (HOSPITAL_COMMUNITY): Payer: Medicare Other

## 2015-04-16 DIAGNOSIS — Z951 Presence of aortocoronary bypass graft: Secondary | ICD-10-CM | POA: Diagnosis not present

## 2015-04-18 ENCOUNTER — Encounter (HOSPITAL_COMMUNITY)
Admission: RE | Admit: 2015-04-18 | Discharge: 2015-04-18 | Disposition: A | Discharge status: Home / Self Care | Payer: Medicare Other | Source: Ambulatory Visit | Attending: Interventional Cardiology | Admitting: Interventional Cardiology

## 2015-04-18 ENCOUNTER — Ambulatory Visit (HOSPITAL_COMMUNITY): Payer: Medicare Other

## 2015-04-18 DIAGNOSIS — Z951 Presence of aortocoronary bypass graft: Secondary | ICD-10-CM | POA: Diagnosis not present

## 2015-04-21 ENCOUNTER — Ambulatory Visit (HOSPITAL_COMMUNITY): Payer: Medicare Other

## 2015-04-21 ENCOUNTER — Ambulatory Visit (INDEPENDENT_AMBULATORY_CARE_PROVIDER_SITE_OTHER): Payer: Medicare Other | Admitting: Interventional Cardiology

## 2015-04-21 ENCOUNTER — Other Ambulatory Visit: Payer: Medicare Other | Admitting: *Deleted

## 2015-04-21 ENCOUNTER — Encounter: Payer: Self-pay | Admitting: Interventional Cardiology

## 2015-04-21 ENCOUNTER — Encounter (HOSPITAL_COMMUNITY): Payer: Medicare Other

## 2015-04-21 VITALS — BP 106/50 | HR 63 | Ht 68.0 in | Wt 186.4 lb

## 2015-04-21 DIAGNOSIS — I25810 Atherosclerosis of coronary artery bypass graft(s) without angina pectoris: Secondary | ICD-10-CM | POA: Diagnosis not present

## 2015-04-21 DIAGNOSIS — I2510 Atherosclerotic heart disease of native coronary artery without angina pectoris: Secondary | ICD-10-CM | POA: Diagnosis not present

## 2015-04-21 DIAGNOSIS — D696 Thrombocytopenia, unspecified: Secondary | ICD-10-CM

## 2015-04-21 DIAGNOSIS — Z951 Presence of aortocoronary bypass graft: Secondary | ICD-10-CM

## 2015-04-21 DIAGNOSIS — I480 Paroxysmal atrial fibrillation: Secondary | ICD-10-CM

## 2015-04-21 DIAGNOSIS — Z01810 Encounter for preprocedural cardiovascular examination: Secondary | ICD-10-CM

## 2015-04-21 LAB — HEPATIC FUNCTION PANEL
ALBUMIN: 4 g/dL (ref 3.5–5.2)
ALK PHOS: 67 U/L (ref 39–117)
ALT: 23 U/L (ref 0–53)
AST: 20 U/L (ref 0–37)
Bilirubin, Direct: 0.3 mg/dL (ref 0.0–0.3)
TOTAL PROTEIN: 7.3 g/dL (ref 6.0–8.3)
Total Bilirubin: 1 mg/dL (ref 0.2–1.2)

## 2015-04-21 LAB — LIPID PANEL
CHOLESTEROL: 66 mg/dL (ref 0–200)
HDL: 25 mg/dL — ABNORMAL LOW (ref >39.00)
LDL Cholesterol: 20 mg/dL (ref 0–99)
NONHDL: 41
Total CHOL/HDL Ratio: 3
Triglycerides: 104 mg/dL (ref 0.0–149.0)
VLDL: 20.8 mg/dL (ref 0.0–40.0)

## 2015-04-21 NOTE — Progress Notes (Signed)
Patient ID: Eric [name], male   DOB: 28-Sep-1946, 68 y.o.   MRN: [id number]     Cardiology Office Note   Date:  04/21/2015   ID:  Eric [name], DOB 03-Aug-1946, MRN [id number]  PCP:  Lilian Coma, MD    No chief complaint on file.  follow-up CAD   Wt Readings from Last 3 Encounters:  04/21/15 186 lb 6.4 oz (84.55 kg)  04/03/15 183 lb 8 oz (83.235 kg)  03/06/15 185 lb 13.6 oz (84.3 kg)       History of Present Illness: Eric [name] is a 69 y.o. male  Who has MDS>  He had severe CAD diagnosed in 2/16 and had CABG.  He had some PAF at the time of the CABG.  He is doing cardiac rehab.  He has some exertional [facility] but this is getting better.  He has some stress at home since his wife had a fall and shoulder injury.  Normal EF at the time of CABG.  No prior MI.  He still gets injections for the MDS.  Hbg is running low.  He had a transfusion of PRBCs.  He has had low platelets as well.  His ASA was stopped.    He has been decreasing amiodarone.  No sx of AFib at this time.    He was also found to have gallstones.  He thinks he will  have a cholecystectomy.  He has a little pain eating fatty foods.  He is seeing the surgeon later this month.       Past Medical History  Diagnosis Date  . MDS (myelodysplastic syndrome), low grade 2012    on observation; MDS FISH panel on 04/26/2013 was normal; cytogenetics were also negative.   . Rheumatoid arthritis(714.0)     was on MTX until MDS dx  . Hypertension   . Anemia   . Family history of ischemic heart disease   . Shortness of breath     exertional  . Dysrhythmia   . Tachycardia 2013    evaluated by Dr Irish Lack @ [place]  . Gout   . Needs flu shot 08/23/2013  . Headache 03/07/2014  . CAD (coronary artery disease), native coronary artery 01/09/2015    3 v dz, EF 50%    Past Surgical History  Procedure Laterality Date  . Knee arthroscopy Left   . Hernia repair    . Umbilical hernia repair    . Circumcision    . Total  knee arthroplasty Right 02/02/2013    Procedure: TOTAL KNEE ARTHROPLASTY;  Surgeon: Yvette Rack., MD;  Location: [place];  Service: Orthopedics;  Laterality: Right;  RIGHT ARTHROPLASTY KNEE MEDIAL/LATERAL COMPARTMENTS WITH PATELLA RESURFACING   . Left heart catheterization with coronary angiogram N/A 01/09/2015    Procedure: LEFT HEART CATHETERIZATION WITH CORONARY ANGIOGRAM;  Surgeon: Burnell Blanks, MD;  LAD 99/90%, D1 50%, CFX 40%, OM 70%, OM branch 80%, RCA  50%, dRCA 99%, PDA 50%, EF 50%  . Coronary artery bypass graft N/A 01/13/2015    Procedure: CORONARY ARTERY BYPASS GRAFTING (CABG) times four, using left internal mammary artery and left greater saphenous vein.;  Surgeon: Ivin Poot, MD;  Location: [place];  Service: Open Heart Surgery;  Laterality: N/A;  . Tee without cardioversion N/A 01/13/2015    Procedure: TRANSESOPHAGEAL ECHOCARDIOGRAM (TEE);  Surgeon: Ivin Poot, MD;  Location: [place];  Service: Open Heart Surgery;  Laterality: N/A;     Current Outpatient Prescriptions  Medication Sig Dispense Refill  . acetaminophen (TYLENOL) 500 MG tablet Take 1,000 mg by mouth every 6 (six) hours as needed for pain.    Marland Kitchen alendronate (FOSAMAX) 70 MG tablet Take 70 mg by mouth once a week. On Friday. Take with a full glass of water on an empty stomach.    Marland Kitchen allopurinol (ZYLOPRIM) 100 MG tablet Take 100 mg by mouth daily.    Marland Kitchen ALPRAZolam (XANAX) 0.25 MG tablet Take 0.125 mg by mouth 2 (two) times daily as needed for anxiety.   0  . atorvastatin (LIPITOR) 80 MG tablet Take 1 tablet (80 mg total) by mouth daily at 6 PM. 30 tablet 3  . Calcium-Vitamin D (CALTRATE 600 PLUS-VIT D PO) Take 1 tablet by mouth 2 (two) times daily.     . Darbepoetin Alfa (ARANESP) 500 MCG/ML SOSY injection Inject 500 mcg into the skin every 21 ( twenty-one) days.    Marland Kitchen lisinopril-hydrochlorothiazide (PRINZIDE,ZESTORETIC) 20-25 MG per tablet Take 1 tablet by mouth daily. TAKE 1/2 TAB BY MOUTH DAILY  1  . loratadine  (CLARITIN) 10 MG tablet Take 10 mg by mouth daily.    . metoprolol (LOPRESSOR) 50 MG tablet take 1/2 tablet by mouth twice a day 30 tablet 6  . MULTIPLE VITAMINS-MINERALS PO Take 1 tablet by mouth 2 (two) times daily.    Marland Kitchen NITROSTAT 0.4 MG SL tablet Place 0.4 mg under the tongue every 5 (five) minutes as needed.   0  . Omega-3 Fatty Acids (FISH OIL) 1000 MG CAPS Take 2,000 mg by mouth 2 (two) times daily.    . predniSONE (DELTASONE) 5 MG tablet Take 15 mg by mouth daily.    . traMADol (ULTRAM) 50 MG tablet Take 25 mg by mouth every 4 (four) hours as needed for pain.      No current facility-administered medications for this visit.    Allergies:   Review of patient's allergies indicates no known allergies.    Social History:  The patient  reports that he quit smoking about 45 years ago. His smoking use included Cigarettes. He has never used smokeless tobacco. He reports that he does not drink alcohol or use illicit drugs.   Family History:  The patient's *family history includes Brain cancer in his mother; Breast cancer in his sister; Diabetes in his brother; Heart attack in his brother and father; Heart disease in his brother and father. There is no history of Stroke.    ROS:  Please see the history of present illness.   Otherwise, review of systems are positive for exertional shortness of breath which is improving.   All other systems are reviewed and negative.    PHYSICAL EXAM: VS:  BP 106/50 mmHg  Pulse 63  Ht 5\' 8"  (1.727 m)  Wt 186 lb 6.4 oz (84.55 kg)  BMI 28.35 kg/m2  SpO2 98% , BMI Body mass index is 28.35 kg/(m^2). GEN: Well nourished, well developed, in no acute distress HEENT: normal Neck: no JVD, carotid bruits, or masses Cardiac: RRR; 2/6 systolic murmur, no rubs, or gallops,no edema  Respiratory:  clear to auscultation bilaterally, normal work of breathing GI: soft, nontender, nondistended, + BS MS: no deformity or atrophy Skin: warm and dry, no rash Neuro:   Strength and sensation are intact Psych: euthymic mood, full affect   EKG:   The ekg ordered today demonstrates sinus bradycardia, NSST.  Since last ECG in February 2016, inferolateral changes are more pronounced.   Recent Labs: 01/30/2015: Magnesium 2.3  01/18/2015: ALT 18 02/03/2015: BUN 32*; Creatinine 1.00; Potassium 4.6; Sodium 136 04/10/2015: Hemoglobin 8.5*; Platelets 61 Large & giant platelets*   Lipid Panel    Component Value Date/Time   CHOL 116 01/10/2015 0530   TRIG 120 01/10/2015 0530   HDL 30* 01/10/2015 0530   CHOLHDL 3.9 01/10/2015 0530   VLDL 24 01/10/2015 0530   LDLCALC 62 01/10/2015 0530     Other studies Reviewed: Additional studies/ records that were reviewed today with results demonstrating: CABG in 2/16; anemia and low platelets by CBC in the last few months.   ASSESSMENT AND PLAN:  1. CAD: No angina.  Aspirin stopped due to low platelets. 2. AFib: Resolved at this point. Will discontinue amiodarone. 3. Preoperative eval: May need cholecystectomy. Will continue to hold his aspirin at this point for multiple reasons. No further cardiac evaluation needed prior to cholecystectomy if this happens in the near future. 4. Anemia/Thrombocytopenia: Platelets increased to 60,000. However, given that he may have surgery, will continue to hold his aspirin. 5. Hyperlipidemia: Started lipitor 80 mg daily at the time of CABG.  Recheck lipids.  If LDL well below target, may try to decrease lipitor. Agree that he will need some dose of life time statin.    Current medicines are reviewed at length with the patient today.  The patient concerns regarding his medicines were addressed.  The following changes have been made:  Stopping amiodarone  Labs/ tests ordered today include: Lipids liver tests   Orders Placed This Encounter  Procedures  . Lipid Profile  . Hepatic function panel    Recommend 150 minutes/week of aerobic exercise Low fat, low carb, high fiber diet  recommended  Disposition:   FU in 6 months   Teresita Madura., MD  04/21/2015 9:19 AM    [facility] [place], [place], [place]  [address] Phone: [phone]; Fax: [phone]

## 2015-04-21 NOTE — Patient Instructions (Signed)
Medication Instructions: - Stop amiodarone  Labwork: - Your physician recommends that you have a FASTING lipid/ liver profile: today  Procedures/Testing: - none  Follow-Up: - Your physician wants you to follow-up in: 6 months with Dr. Irish Lack. You will receive a reminder letter in the mail two months in advance. If you don't receive a letter, please call our office to schedule the follow-up appointment.  Any Additional Special Instructions Will Be Listed Below (If Applicable). - none

## 2015-04-23 ENCOUNTER — Ambulatory Visit (HOSPITAL_COMMUNITY): Payer: Medicare Other

## 2015-04-23 ENCOUNTER — Encounter (HOSPITAL_COMMUNITY)
Admission: RE | Admit: 2015-04-23 | Discharge: 2015-04-23 | Disposition: A | Discharge status: Home / Self Care | Payer: Medicare Other | Source: Ambulatory Visit | Attending: Interventional Cardiology | Admitting: Interventional Cardiology

## 2015-04-23 ENCOUNTER — Telehealth: Payer: Self-pay | Admitting: Interventional Cardiology

## 2015-04-23 DIAGNOSIS — Z951 Presence of aortocoronary bypass graft: Secondary | ICD-10-CM | POA: Diagnosis not present

## 2015-04-23 MED ORDER — ATORVASTATIN CALCIUM 20 MG PO TABS: 20.0000 mg | ORAL_TABLET | Freq: Every day | ORAL | Status: DC

## 2015-04-23 NOTE — Telephone Encounter (Signed)
New Message  Pt returning Jennifer's phone call. Please call back and discuss.

## 2015-04-23 NOTE — Telephone Encounter (Signed)
Pt returning call for lab results. Informed pt of results, to decrease Atorvastatin to 20mg  daily and to recheck lab in 6 months. Pt verbalized understanding and was in agreement with this plan.

## 2015-04-25 ENCOUNTER — Encounter (HOSPITAL_COMMUNITY)
Admission: RE | Admit: 2015-04-25 | Discharge: 2015-04-25 | Disposition: A | Discharge status: Home / Self Care | Payer: Medicare Other | Source: Ambulatory Visit | Attending: Interventional Cardiology | Admitting: Interventional Cardiology

## 2015-04-25 ENCOUNTER — Ambulatory Visit (HOSPITAL_COMMUNITY): Payer: Medicare Other

## 2015-04-25 DIAGNOSIS — Z951 Presence of aortocoronary bypass graft: Secondary | ICD-10-CM | POA: Diagnosis not present

## 2015-04-28 ENCOUNTER — Ambulatory Visit (HOSPITAL_COMMUNITY): Payer: Medicare Other

## 2015-04-28 ENCOUNTER — Encounter (HOSPITAL_COMMUNITY)
Admission: RE | Admit: 2015-04-28 | Discharge: 2015-04-28 | Disposition: A | Discharge status: Home / Self Care | Payer: Medicare Other | Source: Ambulatory Visit | Attending: Interventional Cardiology | Admitting: Interventional Cardiology

## 2015-04-28 DIAGNOSIS — Z951 Presence of aortocoronary bypass graft: Secondary | ICD-10-CM | POA: Diagnosis not present

## 2015-04-30 ENCOUNTER — Ambulatory Visit (HOSPITAL_COMMUNITY): Payer: Medicare Other

## 2015-04-30 ENCOUNTER — Encounter (HOSPITAL_COMMUNITY)
Admission: RE | Admit: 2015-04-30 | Discharge: 2015-04-30 | Disposition: A | Discharge status: Home / Self Care | Payer: Medicare Other | Source: Ambulatory Visit | Attending: Interventional Cardiology | Admitting: Interventional Cardiology

## 2015-04-30 DIAGNOSIS — Z951 Presence of aortocoronary bypass graft: Secondary | ICD-10-CM | POA: Diagnosis not present

## 2015-05-01 ENCOUNTER — Encounter: Payer: Self-pay | Admitting: *Deleted

## 2015-05-01 ENCOUNTER — Ambulatory Visit (HOSPITAL_BASED_OUTPATIENT_CLINIC_OR_DEPARTMENT_OTHER): Payer: Medicare Other

## 2015-05-01 ENCOUNTER — Other Ambulatory Visit: Payer: Medicare Other

## 2015-05-01 ENCOUNTER — Other Ambulatory Visit (HOSPITAL_BASED_OUTPATIENT_CLINIC_OR_DEPARTMENT_OTHER): Payer: Medicare Other

## 2015-05-01 ENCOUNTER — Other Ambulatory Visit: Payer: Self-pay | Admitting: *Deleted

## 2015-05-01 ENCOUNTER — Ambulatory Visit (HOSPITAL_COMMUNITY)
Admission: RE | Admit: 2015-05-01 | Discharge: 2015-05-01 | Disposition: A | Discharge status: Home / Self Care | Payer: Medicare Other | Source: Ambulatory Visit | Attending: Hematology and Oncology | Admitting: Hematology and Oncology

## 2015-05-01 ENCOUNTER — Ambulatory Visit: Payer: PRIVATE HEALTH INSURANCE | Admitting: Interventional Cardiology

## 2015-05-01 VITALS — BP 108/59 | HR 53 | Temp 98.2°F

## 2015-05-01 VITALS — BP 109/50 | HR 56 | Temp 97.9°F | Resp 18

## 2015-05-01 DIAGNOSIS — D4620 Refractory anemia with excess of blasts, unspecified: Secondary | ICD-10-CM | POA: Diagnosis present

## 2015-05-01 DIAGNOSIS — D469 Myelodysplastic syndrome, unspecified: Secondary | ICD-10-CM

## 2015-05-01 DIAGNOSIS — D630 Anemia in neoplastic disease: Secondary | ICD-10-CM

## 2015-05-01 DIAGNOSIS — D696 Thrombocytopenia, unspecified: Secondary | ICD-10-CM

## 2015-05-01 DIAGNOSIS — D460 Refractory anemia without ring sideroblasts, so stated: Secondary | ICD-10-CM | POA: Diagnosis present

## 2015-05-01 LAB — CBC WITH DIFFERENTIAL/PLATELET
BASO%: 1.5 % (ref 0.0–2.0)
BASOS ABS: 0.2 10*3/uL — AB (ref 0.0–0.1)
EOS ABS: 0 10*3/uL (ref 0.0–0.5)
EOS%: 0 % (ref 0.0–7.0)
HEMATOCRIT: 22.9 % — AB (ref 38.4–49.9)
HGB: 7.4 g/dL — ABNORMAL LOW (ref 13.0–17.1)
LYMPH#: 1.6 10*3/uL (ref 0.9–3.3)
LYMPH%: 13.7 % — ABNORMAL LOW (ref 14.0–49.0)
MCH: 24.9 pg — AB (ref 27.2–33.4)
MCHC: 32.4 g/dL (ref 32.0–36.0)
MCV: 76.7 fL — AB (ref 79.3–98.0)
MONO#: 0.8 10*3/uL (ref 0.1–0.9)
MONO%: 6.6 % (ref 0.0–14.0)
NEUT#: 9.1 10*3/uL — ABNORMAL HIGH (ref 1.5–6.5)
NEUT%: 78.2 % — ABNORMAL HIGH (ref 39.0–75.0)
Platelets: 64 10*3/uL — ABNORMAL LOW (ref 140–400)
RBC: 2.98 10*6/uL — AB (ref 4.20–5.82)
RDW: 31.4 % — AB (ref 11.0–14.6)
WBC: 11.6 10*3/uL — AB (ref 4.0–10.3)

## 2015-05-01 LAB — HOLD TUBE, BLOOD BANK

## 2015-05-01 LAB — PREPARE RBC (CROSSMATCH)

## 2015-05-01 LAB — TECHNOLOGIST REVIEW

## 2015-05-01 MED ORDER — ACETAMINOPHEN 325 MG PO TABS
ORAL_TABLET | ORAL | Status: AC
  Filled 2015-05-01: qty 2

## 2015-05-01 MED ORDER — SODIUM CHLORIDE 0.9 % IJ SOLN
10.0000 mL | INTRAMUSCULAR | Status: DC | PRN
  Filled 2015-05-01: qty 10

## 2015-05-01 MED ORDER — DARBEPOETIN ALFA 500 MCG/ML IJ SOSY
500.0000 ug | PREFILLED_SYRINGE | Freq: Once | INTRAMUSCULAR | Status: AC
  Administered 2015-05-01: 500 ug via SUBCUTANEOUS
  Filled 2015-05-01: qty 1

## 2015-05-01 MED ORDER — HEPARIN SOD (PORK) LOCK FLUSH 100 UNIT/ML IV SOLN
500.0000 [IU] | Freq: Every day | INTRAVENOUS | Status: DC | PRN
  Filled 2015-05-01: qty 5

## 2015-05-01 MED ORDER — SODIUM CHLORIDE 0.9 % IV SOLN
250.0000 mL | Freq: Once | INTRAVENOUS | Status: AC
  Administered 2015-05-01: 250 mL via INTRAVENOUS

## 2015-05-01 MED ORDER — ACETAMINOPHEN 325 MG PO TABS
650.0000 mg | ORAL_TABLET | Freq: Once | ORAL | Status: AC
  Administered 2015-05-01: 650 mg via ORAL

## 2015-05-01 NOTE — Patient Instructions (Signed)

## 2015-05-02 ENCOUNTER — Ambulatory Visit (HOSPITAL_COMMUNITY): Payer: Medicare Other

## 2015-05-02 ENCOUNTER — Encounter (HOSPITAL_COMMUNITY)
Admission: RE | Admit: 2015-05-02 | Discharge: 2015-05-02 | Disposition: A | Discharge status: Home / Self Care | Payer: Medicare Other | Source: Ambulatory Visit | Attending: Interventional Cardiology | Admitting: Interventional Cardiology

## 2015-05-02 DIAGNOSIS — Z951 Presence of aortocoronary bypass graft: Secondary | ICD-10-CM | POA: Diagnosis not present

## 2015-05-02 LAB — TYPE AND SCREEN
ABO/RH(D): O POS
Antibody Screen: NEGATIVE
Unit division: 0

## 2015-05-02 NOTE — Progress Notes (Signed)
  30 day Psychosocial followup assessment  Patient psychosocial assessment reveals no barriers to cardiac rehab participation. Pt is doing well with exercise and overall feels good.  Pt will have a surgeon consult on next Tuesday for gall stones.  This was rescheduled from yesterday because pt had to stay for blood transfusion when he went to see the physician.  Pt is dealing with the slow recovery of his wife shoulder surgery.  He is the primary caregiver for household chores, cooking and shopping for the home.  He remains hopeful that she will continue to make progress in this area. Overall he remains positive concerning his outlook and has returned to those activities he finds pleasurable.  Continue to monitor and intervene as needed. Cherre Huger, BSN

## 2015-05-06 ENCOUNTER — Telehealth: Payer: Self-pay | Admitting: *Deleted

## 2015-05-06 NOTE — Telephone Encounter (Signed)
1) BM biopsy can be done independent of gallbladder surgery 2) OK to reduce prednisone to previous dose (I think was 7.5 mg) 3) BM dates for me: 6/3 or 6/8

## 2015-05-06 NOTE — Telephone Encounter (Signed)
Pt states he understands the prednisone is not working and he is willing to have another BMBx scheduled.   He sees Surgeon this week on 6/2 to consult for having his gallbladder removed.  He does not have surgery date yet.   So he wants to make sure the BMBx will not interfere with his gallbladder surgery.   He also asks if he should decrease his prednisone dose?

## 2015-05-06 NOTE — Telephone Encounter (Signed)
Thanks

## 2015-05-06 NOTE — Telephone Encounter (Signed)
-----   Message from Heath Lark, MD sent at 05/05/2015  9:11 PM EDT ----- Regarding: CBC Can you call him? We increased prednisone to 15 mg; not really working. Would he be willing to consider additional treatment/ BM biopsy?

## 2015-05-06 NOTE — Telephone Encounter (Signed)
BMBx will be scheduled 6/8 at [facility] at 8 am.   Pt to arrive at 7:45 am for 8 am Biopsy and lab afterwards.   Left VM for pt/wife w/ date and time of biopsy.  Spoke to pt previous call and he requested unsedated BMBx and I also instructed him to decrease prednisone to 7.5 mg daily.  Pt verbalized understanding. Notified Carter in [employer] of BMBx.

## 2015-05-07 ENCOUNTER — Encounter (HOSPITAL_COMMUNITY)
Admission: RE | Admit: 2015-05-07 | Discharge: 2015-05-07 | Disposition: A | Discharge status: Home / Self Care | Payer: Medicare Other | Source: Ambulatory Visit | Attending: Interventional Cardiology | Admitting: Interventional Cardiology

## 2015-05-07 ENCOUNTER — Ambulatory Visit (HOSPITAL_COMMUNITY): Payer: Medicare Other

## 2015-05-07 ENCOUNTER — Telehealth: Payer: Self-pay | Admitting: Hematology and Oncology

## 2015-05-07 DIAGNOSIS — Z951 Presence of aortocoronary bypass graft: Secondary | ICD-10-CM | POA: Diagnosis not present

## 2015-05-07 NOTE — Telephone Encounter (Signed)
Added appt per pof...per orders pof pt aware °

## 2015-05-09 ENCOUNTER — Encounter (HOSPITAL_COMMUNITY)
Admission: RE | Admit: 2015-05-09 | Discharge: 2015-05-09 | Disposition: A | Discharge status: Home / Self Care | Payer: Medicare Other | Source: Ambulatory Visit | Attending: Interventional Cardiology | Admitting: Interventional Cardiology

## 2015-05-09 ENCOUNTER — Ambulatory Visit (HOSPITAL_COMMUNITY): Payer: Medicare Other

## 2015-05-09 DIAGNOSIS — Z951 Presence of aortocoronary bypass graft: Secondary | ICD-10-CM | POA: Diagnosis not present

## 2015-05-12 ENCOUNTER — Encounter (HOSPITAL_COMMUNITY)
Admission: RE | Admit: 2015-05-12 | Discharge: 2015-05-12 | Disposition: A | Discharge status: Home / Self Care | Payer: Medicare Other | Source: Ambulatory Visit | Attending: Interventional Cardiology | Admitting: Interventional Cardiology

## 2015-05-12 ENCOUNTER — Ambulatory Visit (HOSPITAL_COMMUNITY): Payer: Medicare Other

## 2015-05-12 DIAGNOSIS — Z951 Presence of aortocoronary bypass graft: Secondary | ICD-10-CM | POA: Diagnosis not present

## 2015-05-14 ENCOUNTER — Other Ambulatory Visit (HOSPITAL_BASED_OUTPATIENT_CLINIC_OR_DEPARTMENT_OTHER): Payer: Medicare Other

## 2015-05-14 ENCOUNTER — Other Ambulatory Visit: Payer: Self-pay | Admitting: Hematology and Oncology

## 2015-05-14 ENCOUNTER — Telehealth: Payer: Self-pay | Admitting: Hematology and Oncology

## 2015-05-14 ENCOUNTER — Ambulatory Visit (HOSPITAL_BASED_OUTPATIENT_CLINIC_OR_DEPARTMENT_OTHER): Payer: Medicare Other | Admitting: Hematology and Oncology

## 2015-05-14 ENCOUNTER — Other Ambulatory Visit (HOSPITAL_COMMUNITY)
Admission: RE | Admit: 2015-05-14 | Discharge: 2015-05-14 | Disposition: A | Discharge status: Home / Self Care | Payer: Medicare Other | Source: Ambulatory Visit | Attending: Hematology and Oncology | Admitting: Hematology and Oncology

## 2015-05-14 ENCOUNTER — Ambulatory Visit (HOSPITAL_COMMUNITY): Payer: Medicare Other

## 2015-05-14 ENCOUNTER — Encounter (HOSPITAL_COMMUNITY): Payer: Medicare Other

## 2015-05-14 ENCOUNTER — Encounter: Payer: Self-pay | Admitting: Hematology and Oncology

## 2015-05-14 ENCOUNTER — Telehealth (HOSPITAL_COMMUNITY): Payer: Self-pay | Admitting: *Deleted

## 2015-05-14 ENCOUNTER — Other Ambulatory Visit (HOSPITAL_COMMUNITY): Payer: Medicare Other

## 2015-05-14 VITALS — BP 105/43 | HR 53 | Temp 98.0°F

## 2015-05-14 DIAGNOSIS — D460 Refractory anemia without ring sideroblasts, so stated: Secondary | ICD-10-CM | POA: Diagnosis present

## 2015-05-14 DIAGNOSIS — D4620 Refractory anemia with excess of blasts, unspecified: Secondary | ICD-10-CM

## 2015-05-14 DIAGNOSIS — D630 Anemia in neoplastic disease: Secondary | ICD-10-CM

## 2015-05-14 DIAGNOSIS — D696 Thrombocytopenia, unspecified: Secondary | ICD-10-CM | POA: Diagnosis not present

## 2015-05-14 DIAGNOSIS — D469 Myelodysplastic syndrome, unspecified: Secondary | ICD-10-CM | POA: Insufficient documentation

## 2015-05-14 LAB — CBC WITH DIFFERENTIAL/PLATELET
BASO%: 0.4 % (ref 0.0–2.0)
Basophils Absolute: 0 10*3/uL (ref 0.0–0.1)
EOS%: 0 % (ref 0.0–7.0)
Eosinophils Absolute: 0 10*3/uL (ref 0.0–0.5)
HCT: 25.4 % — ABNORMAL LOW (ref 38.4–49.9)
HEMOGLOBIN: 8 g/dL — AB (ref 13.0–17.1)
LYMPH%: 17.8 % (ref 14.0–49.0)
MCH: 24.5 pg — ABNORMAL LOW (ref 27.2–33.4)
MCHC: 31.5 g/dL — ABNORMAL LOW (ref 32.0–36.0)
MCV: 77.9 fL — ABNORMAL LOW (ref 79.3–98.0)
MONO#: 0.8 10*3/uL (ref 0.1–0.9)
MONO%: 9.9 % (ref 0.0–14.0)
NEUT#: 5.4 10*3/uL (ref 1.5–6.5)
NEUT%: 71.9 % (ref 39.0–75.0)
PLATELETS: 33 10*3/uL — AB (ref 140–400)
RBC: 3.26 10*6/uL — AB (ref 4.20–5.82)
RDW: 26.3 % — AB (ref 11.0–14.6)
WBC: 7.5 10*3/uL (ref 4.0–10.3)
lymph#: 1.3 10*3/uL (ref 0.9–3.3)
nRBC: 1 % — ABNORMAL HIGH (ref 0–0)

## 2015-05-14 LAB — BONE MARROW EXAM

## 2015-05-14 LAB — TECHNOLOGIST REVIEW

## 2015-05-14 LAB — HOLD TUBE, BLOOD BANK

## 2015-05-14 NOTE — Telephone Encounter (Signed)
-----   Message from Heath Lark, MD sent at 05/14/2015  8:28 AM EDT ----- Regarding: RE: Ok to resum exercise at cardiac rehab Thanks for this update I just gave him a letter to skip Friday's session He is OK to resume Monday without restriction ----- Message -----    From: Rowe Pavy, RN    Sent: 05/14/2015   8:21 AM      To: Heath Lark, MD Subject: Ok to resum exercise at cardiac rehab          Dr. Alvy Bimler,  Eric [name] participates in cardiac rehab.  He participates in rehab 3 x week for 1 1/2 hours.  He exercises on stationary bike, recumbenet stepper and walks the track.The patient  had a bone biopsy on yesterday June 7th.  When may patient resume exercise here at cardiac rehab?  Thanks for your input  [place]

## 2015-05-14 NOTE — Progress Notes (Signed)
Right iliac crest bone marrow biopsy done per Dr. Gorsuch. Pt tolerated well. Pressure dressing applied to site and pt remained in supine position for 15 minutes post biopsy. No unusual bleeding. Dressing dry and intact. To lab after recovery time completed. 

## 2015-05-14 NOTE — Progress Notes (Signed)
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    INTERVAL HISTORY: Please see below for problem oriented charting. He feels well. The patient denies any recent signs or symptoms of bleeding such as spontaneous epistaxis, hematuria or hematochezia.   REVIEW OF SYSTEMS:   Constitutional: Denies fevers, chills or abnormal weight loss Eyes: Denies blurriness of vision Ears, nose, mouth, throat, and face: Denies mucositis or sore throat Respiratory: Denies cough, dyspnea or wheezes Cardiovascular: Denies palpitation, chest discomfort or lower extremity swelling Gastrointestinal:  Denies nausea, heartburn or change in bowel habits Skin: Denies abnormal skin rashes Lymphatics: Denies new lymphadenopathy or easy bruising Neurological:Denies numbness, tingling or new weaknesses Behavioral/Psych: Mood is stable, no new changes  All other systems were reviewed with the patient and are negative.  I have reviewed the past medical history, past surgical history, social history and family history with the patient and they are unchanged from previous note.  ALLERGIES:  has No Known Allergies.  MEDICATIONS:  Current Outpatient Prescriptions  Medication Sig Dispense Refill  . acetaminophen (TYLENOL) 500 MG tablet Take 1,000 mg by mouth every 6 (six) hours as needed for pain.    Marland Kitchen alendronate (FOSAMAX) 70 MG tablet Take 70 mg by mouth once a week. On Friday. Take with a full glass of water on an empty stomach.     Marland Kitchen allopurinol (ZYLOPRIM) 100 MG tablet Take 100 mg by mouth daily.    Marland Kitchen ALPRAZolam (XANAX) 0.25 MG tablet Take 0.125 mg by mouth 2 (two) times daily as needed for anxiety.   0  . atorvastatin (LIPITOR) 20 MG tablet Take 1 tablet (20 mg total) by mouth daily. 90 tablet 3  . Calcium-Vitamin D (CALTRATE 600 PLUS-VIT D PO) Take 1 tablet by mouth 2 (two) times daily.     . Darbepoetin Alfa (ARANESP) 500 MCG/ML SOSY injection Inject 500 mcg into the skin every 21 ( twenty-one) days.    Marland Kitchen lisinopril-hydrochlorothiazide (PRINZIDE,ZESTORETIC) 20-25 MG per tablet Take 1 tablet by mouth daily. TAKE 1/2 TAB BY MOUTH DAILY  1  . loratadine (CLARITIN) 10 MG tablet Take 10 mg by mouth daily.    . metoprolol (LOPRESSOR) 50 MG tablet take 1/2 tablet by mouth twice a day 30 tablet 6  . MULTIPLE VITAMINS-MINERALS PO Take 1 tablet by mouth 2 (two) times daily.    Marland Kitchen NITROSTAT 0.4 MG SL tablet Place 0.4 mg under the tongue every 5 (five) minutes as needed.   0  . Omega-3 Fatty Acids (FISH OIL) 1000 MG CAPS Take 2,000 mg by mouth 2 (two) times daily.    . predniSONE (DELTASONE) 5 MG tablet Take 15 mg by mouth daily.    . traMADol (ULTRAM) 50 MG tablet Take 25 mg by mouth every 4 (four) hours as needed for pain.      No current facility-administered medications for this visit.    PHYSICAL EXAMINATION: ECOG PERFORMANCE STATUS: 1 - Symptomatic but completely ambulatory  Filed Vitals:   05/14/15 0834  BP: 105/43  Pulse: 53  Temp: 98 F (36.7 C)   There were  no vitals filed for this visit.  GENERAL:alert, no distress and comfortable SKIN: skin color, texture, turgor are normal, no rashes or significant lesions EYES: normal, Conjunctiva are pink and non-injected, sclera clear Musculoskeletal:no cyanosis of digits and no clubbing  NEURO: alert & oriented x 3 with fluent speech, no focal motor/sensory deficits  LABORATORY DATA:  I have reviewed the data as listed    Component Value Date/Time   NA 136  02/03/2015 1219   NA 138 05/30/2014 1138   K 4.6 02/03/2015 1219   K 3.7 05/30/2014 1138   CL 103 02/03/2015 1219   CL 102 04/09/2013 1347   CO2 27 02/03/2015 1219   CO2 24 05/30/2014 1138   GLUCOSE 109* 02/03/2015 1219   GLUCOSE 147* 05/30/2014 1138   GLUCOSE 113* 04/09/2013 1347   BUN 32* 02/03/2015 1219   BUN 26.2* 05/30/2014 1138   CREATININE 1.00 02/03/2015 1219   CREATININE 0.8 05/30/2014 1138   CALCIUM 9.7 02/03/2015 1219   CALCIUM 9.8 05/30/2014 1138   PROT 7.3 04/21/2015 0908   PROT 7.4 11/22/2013 0847   ALBUMIN 4.0 04/21/2015 0908   ALBUMIN 4.1 11/22/2013 0847   AST 20 04/21/2015 0908   AST 15 11/22/2013 0847   ALT 23 04/21/2015 0908   ALT 15 11/22/2013 0847   ALKPHOS 67 04/21/2015 0908   ALKPHOS 48 11/22/2013 0847   BILITOT 1.0 04/21/2015 0908   BILITOT 1.15 11/22/2013 0847   GFRNONAA 83* 01/21/2015 0406   GFRAA >90 01/21/2015 0406    No results found for: SPEP, UPEP  Lab Results  Component Value Date   WBC 7.5 05/14/2015   NEUTROABS 5.4 05/14/2015   HGB 8.0* 05/14/2015   HCT 25.4* 05/14/2015   MCV 77.9* 05/14/2015   PLT 33* 05/14/2015      Chemistry      Component Value Date/Time   NA 136 02/03/2015 1219   NA 138 05/30/2014 1138   K 4.6 02/03/2015 1219   K 3.7 05/30/2014 1138   CL 103 02/03/2015 1219   CL 102 04/09/2013 1347   CO2 27 02/03/2015 1219   CO2 24 05/30/2014 1138   BUN 32* 02/03/2015 1219   BUN 26.2* 05/30/2014 1138   CREATININE 1.00 02/03/2015 1219   CREATININE 0.8 05/30/2014 1138      Component Value Date/Time   CALCIUM 9.7 02/03/2015 1219   CALCIUM 9.8 05/30/2014 1138   ALKPHOS 67 04/21/2015 0908   ALKPHOS 48 11/22/2013 0847   AST 20 04/21/2015 0908   AST 15 11/22/2013 0847   ALT 23 04/21/2015 0908   ALT 15 11/22/2013 0847   BILITOT 1.0 04/21/2015 0908   BILITOT 1.15 11/22/2013 0847     ASSESSMENT & PLAN:  MDS (myelodysplastic syndrome), low grade He has progressive anemia and thrombocytopenia. I reviewed his  peripheral blood smear which look quite abnormal with fragmented red blood cells and absolute thrombocytopenia. 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 I think we need to be looking at treatment fairly soon. He has no response with increased dose prednisone recently. I recommend bone marrow aspirate & biopsy and he agreed to proceed  Bone Marrow Biopsy and Aspiration Procedure Note   Informed consent was obtained and potential risks including bleeding, infection and pain were reviewed with the patient.  The patient's name, date of birth, identification, consent and allergies were verified prior to the start of procedure and time out was performed.  The right posterior iliac crest was chosen as the site  of biopsy.  The skin was prepped with Betadine solution.   8 cc of 1% lidocaine was used to provide local anaesthesia.   10 cc of bone marrow aspirate was obtained followed by 1 inch biopsy.   The procedure was tolerated well and there were no complications.  The patient was stable at the end of the procedure.  Specimens sent for flow cytometry, cytogenetics and additional studies.   Anemia in neoplastic disease This is likely anemia of chronic disease. The patient denies recent history of bleeding such as epistaxis, hematuria or hematochezia. He is asymptomatic from the anemia. He will continue to receive erythropoietin stimulating agents with darbopoetin 500 mcg every 3 weeks to keep hemoglobin greater than 10 g.    Thrombocytopenia He has significant drop off his platelet count. I suspect it could be a progression of his bone marrow disease. In the meantime, I told him to hold off taking aspirin therapy until his platelet count rebound to greater than 50,000.     Orders Placed This Encounter  Procedures  . Erythropoietin    Standing Status: Future     Number of Occurrences:      Standing Expiration Date: 06/17/2016   All  questions were answered. The patient knows to call the clinic with any problems, questions or concerns. No barriers to learning was detected. I spent 25 minutes counseling the patient face to face. The total time spent in the appointment was 30 minutes and more than 50% was on counseling and review of test results     [facility], [place], MD 05/14/2015 7:05 PM

## 2015-05-14 NOTE — Assessment & Plan Note (Signed)
He has progressive anemia and thrombocytopenia. I reviewed his peripheral blood smear which look quite abnormal with fragmented red blood cells and absolute thrombocytopenia. 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 I think we need to be looking at treatment fairly soon. He has no response with increased dose prednisone recently. I recommend bone marrow aspirate & biopsy and he agreed to proceed  Bone Marrow Biopsy and Aspiration Procedure Note   Informed consent was obtained and potential risks including bleeding, infection and pain were reviewed with the patient.  The patient's name, date of birth, identification, consent and allergies were verified prior to the start of procedure and time out was performed.  The right posterior iliac crest was chosen as the site of biopsy.  The skin was prepped with Betadine solution.   8 cc of 1% lidocaine was used to provide local anaesthesia.   10 cc of bone marrow aspirate was obtained followed by 1 inch biopsy.   The procedure was tolerated well and there were no complications.  The patient was stable at the end of the procedure.  Specimens sent for flow cytometry, cytogenetics and additional studies.

## 2015-05-14 NOTE — Telephone Encounter (Signed)
s.w. pt wife and advised on 6.16 appt...Marland KitchenMarland KitchenMarland Kitchenok and aware

## 2015-05-14 NOTE — Assessment & Plan Note (Signed)
This is likely anemia of chronic disease. The patient denies recent history of bleeding such as epistaxis, hematuria or hematochezia. He is asymptomatic from the anemia. He will continue to receive erythropoietin stimulating agents with darbopoetin 500 mcg every 3 weeks to keep hemoglobin greater than 10 g.

## 2015-05-14 NOTE — Assessment & Plan Note (Signed)
He has significant drop off his platelet count. I suspect it could be a progression of his bone marrow disease. In the meantime, I told him to hold off taking aspirin therapy until his platelet count rebound to greater than 50,000.

## 2015-05-14 NOTE — Patient Instructions (Signed)
Bone Marrow Aspiration and Bone Biopsy Examination of the bone marrow is a valuable test to diagnose blood disorders. A bone marrow biopsy takes a sample of bone and a small amount of fluid and cells from inside the bone. A bone marrow aspiration removes only the marrow. Bone marrow aspiration and bone biopsies are used to stage different disorders of the blood, such as leukemia. Staging will help your caregiver understand how far the disease has progressed.  The tests are also useful in diagnosing:  Fever of unknown origin (FUO).  Bacterial infections and other widespread fungal infections.  Cancers that have spread (metastasized) to the bone marrow.  Diseases that are characterized by a deficiency of an enzyme (storage diseases). This includes:  Niemann-Pick disease.  Gaucher disease. PROCEDURE  Sites used to get samples include:   Back of your hip bone (posterior iliac crest).  Both aspiration and biopsy.  Front of your hip bone (anterior iliac crest).  Both aspiration and biopsy.  Breastbone (sternum).  Aspiration from your breastbone (done only in adults). This method is rarely used. When you get a hip bone aspiration:  You are placed lying on your side with the upper knee brought up and flexed with the lower leg straight.  The site is prepared, cleaned with an antiseptic scrub, and draped. This keeps the biopsy area clean.  The skin and the area down to the lining of the bone (periosteum) are made numb with a local anesthetic.  The bone marrow aspiration needle is inserted. You will feel pressure on your bone.  Once inside the marrow cavity, a sample of bone marrow is sucked out (aspirated) for pathology slides.  The material collected for bone marrow slides is processed immediately by a technologist.  The technician selects the marrow particles to make the slides for pathology.  The marrow aspiration needle is removed. Then pressure is applied to the site with  gauze until bleeding has stopped. Following an aspiration, a bone marrow biopsy may be performed as well. The technique for this is very similar. A dressing is then applied.  RISKS AND COMPLICATIONS  The main complications of a bone marrow aspiration and biopsy include infection and bleeding.  Complications are uncommon. The procedure may not be performed in patients with bleeding tendencies.  A very rare complication from the procedure is injury to the heart during a breastbone (sternal) marrow aspiration. Only bone marrow aspirations are performed in this area.  Long-lasting pain at the site of the bone marrow aspiration and biopsy is uncommon. Your caregiver will let you know when you are to get your results and will discuss them with you. You may make an appointment with your caregiver to find out the results. Do not assume everything is normal if you have not heard from your caregiver or the medical facility. It is important for you to follow up on all of your test results. Document Released: 11/25/2004 Document Revised: 02/14/2012 Document Reviewed: 11/19/2008 [facility] Patient Information 2015 [place], [place]. This information is not intended to replace advice given to you by your health care provider. Make sure you discuss any questions you have with your health care provider.

## 2015-05-15 ENCOUNTER — Telehealth: Payer: Self-pay | Admitting: *Deleted

## 2015-05-15 NOTE — Telephone Encounter (Signed)
-----   Message from Heath Lark, MD sent at 05/14/2015  7:03 PM EDT ----- Regarding: low platelet Pls tell him to hold aspirin

## 2015-05-15 NOTE — Telephone Encounter (Signed)
Pt notified of message below. States he has not been taking aspirin.

## 2015-05-16 ENCOUNTER — Encounter (HOSPITAL_COMMUNITY): Admission: RE | Admit: 2015-05-16 | Payer: Medicare Other | Source: Ambulatory Visit

## 2015-05-16 ENCOUNTER — Ambulatory Visit (HOSPITAL_COMMUNITY): Payer: Medicare Other

## 2015-05-19 ENCOUNTER — Encounter (HOSPITAL_COMMUNITY)
Admission: RE | Admit: 2015-05-19 | Discharge: 2015-05-19 | Disposition: A | Discharge status: Home / Self Care | Payer: Medicare Other | Source: Ambulatory Visit | Attending: Interventional Cardiology | Admitting: Interventional Cardiology

## 2015-05-19 ENCOUNTER — Ambulatory Visit (HOSPITAL_COMMUNITY): Payer: Medicare Other

## 2015-05-19 DIAGNOSIS — Z951 Presence of aortocoronary bypass graft: Secondary | ICD-10-CM | POA: Diagnosis not present

## 2015-05-19 NOTE — Progress Notes (Signed)
Pt returned to exercise today after absence for bone marrow biopsy.  Note obtained from Dr. Alvy Bimler.  Pt able to tolerate exercise today with no difficulties.  Continue to monitor. Cherre Huger, BSN

## 2015-05-21 ENCOUNTER — Ambulatory Visit (HOSPITAL_COMMUNITY): Payer: Medicare Other

## 2015-05-21 ENCOUNTER — Encounter (HOSPITAL_COMMUNITY)
Admission: RE | Admit: 2015-05-21 | Discharge: 2015-05-21 | Disposition: A | Discharge status: Home / Self Care | Payer: Medicare Other | Source: Ambulatory Visit | Attending: Interventional Cardiology | Admitting: Interventional Cardiology

## 2015-05-21 DIAGNOSIS — Z951 Presence of aortocoronary bypass graft: Secondary | ICD-10-CM | POA: Diagnosis not present

## 2015-05-22 ENCOUNTER — Other Ambulatory Visit (HOSPITAL_BASED_OUTPATIENT_CLINIC_OR_DEPARTMENT_OTHER): Payer: Medicare Other

## 2015-05-22 ENCOUNTER — Ambulatory Visit (HOSPITAL_BASED_OUTPATIENT_CLINIC_OR_DEPARTMENT_OTHER): Payer: Medicare Other | Admitting: Hematology and Oncology

## 2015-05-22 ENCOUNTER — Ambulatory Visit (HOSPITAL_COMMUNITY)
Admission: RE | Admit: 2015-05-22 | Discharge: 2015-05-22 | Disposition: A | Discharge status: Home / Self Care | Payer: Medicare Other | Source: Ambulatory Visit | Attending: Hematology and Oncology | Admitting: Hematology and Oncology

## 2015-05-22 ENCOUNTER — Other Ambulatory Visit: Payer: Medicare Other

## 2015-05-22 ENCOUNTER — Encounter: Payer: Self-pay | Admitting: Hematology and Oncology

## 2015-05-22 ENCOUNTER — Ambulatory Visit: Payer: Medicare Other

## 2015-05-22 ENCOUNTER — Ambulatory Visit (HOSPITAL_BASED_OUTPATIENT_CLINIC_OR_DEPARTMENT_OTHER): Payer: Medicare Other

## 2015-05-22 ENCOUNTER — Telehealth: Payer: Self-pay | Admitting: Hematology and Oncology

## 2015-05-22 VITALS — BP 103/44 | HR 60 | Temp 98.1°F | Resp 18 | Ht 68.0 in | Wt 182.2 lb

## 2015-05-22 VITALS — BP 117/49 | HR 60 | Temp 97.9°F | Resp 18

## 2015-05-22 DIAGNOSIS — D469 Myelodysplastic syndrome, unspecified: Secondary | ICD-10-CM

## 2015-05-22 DIAGNOSIS — I952 Hypotension due to drugs: Secondary | ICD-10-CM

## 2015-05-22 DIAGNOSIS — D4620 Refractory anemia with excess of blasts, unspecified: Secondary | ICD-10-CM

## 2015-05-22 DIAGNOSIS — D630 Anemia in neoplastic disease: Secondary | ICD-10-CM

## 2015-05-22 DIAGNOSIS — D696 Thrombocytopenia, unspecified: Secondary | ICD-10-CM

## 2015-05-22 DIAGNOSIS — D46Z Other myelodysplastic syndromes: Secondary | ICD-10-CM | POA: Diagnosis not present

## 2015-05-22 LAB — CBC WITH DIFFERENTIAL/PLATELET
BASO%: 0.3 % (ref 0.0–2.0)
Basophils Absolute: 0 10*3/uL (ref 0.0–0.1)
EOS%: 0 % (ref 0.0–7.0)
Eosinophils Absolute: 0 10*3/uL (ref 0.0–0.5)
HEMATOCRIT: 20.9 % — AB (ref 38.4–49.9)
HEMOGLOBIN: 6.5 g/dL — AB (ref 13.0–17.1)
LYMPH#: 1.3 10*3/uL (ref 0.9–3.3)
LYMPH%: 19.5 % (ref 14.0–49.0)
MCH: 23.9 pg — AB (ref 27.2–33.4)
MCHC: 31.1 g/dL — ABNORMAL LOW (ref 32.0–36.0)
MCV: 76.8 fL — AB (ref 79.3–98.0)
MONO#: 0.6 10*3/uL (ref 0.1–0.9)
MONO%: 8.3 % (ref 0.0–14.0)
NEUT#: 4.8 10*3/uL (ref 1.5–6.5)
NEUT%: 71.9 % (ref 39.0–75.0)
PLATELETS: 45 10*3/uL — AB (ref 140–400)
RBC: 2.72 10*6/uL — ABNORMAL LOW (ref 4.20–5.82)
RDW: 26.5 % — ABNORMAL HIGH (ref 11.0–14.6)
WBC: 6.7 10*3/uL (ref 4.0–10.3)
nRBC: 1 % — ABNORMAL HIGH (ref 0–0)

## 2015-05-22 LAB — CHROMOSOME ANALYSIS, BONE MARROW

## 2015-05-22 LAB — HOLD TUBE, BLOOD BANK

## 2015-05-22 LAB — TECHNOLOGIST REVIEW

## 2015-05-22 LAB — PREPARE RBC (CROSSMATCH)

## 2015-05-22 LAB — TISSUE HYBRIDIZATION (BONE MARROW)-NCBH

## 2015-05-22 MED ORDER — DIPHENHYDRAMINE HCL 25 MG PO CAPS
25.0000 mg | ORAL_CAPSULE | Freq: Once | ORAL | Status: AC
  Administered 2015-05-22: 25 mg via ORAL

## 2015-05-22 MED ORDER — DIPHENHYDRAMINE HCL 25 MG PO CAPS
ORAL_CAPSULE | ORAL | Status: AC
  Filled 2015-05-22: qty 1

## 2015-05-22 MED ORDER — DARBEPOETIN ALFA 500 MCG/ML IJ SOSY
500.0000 ug | PREFILLED_SYRINGE | Freq: Once | INTRAMUSCULAR | Status: AC
  Administered 2015-05-22: 500 ug via SUBCUTANEOUS
  Filled 2015-05-22: qty 1

## 2015-05-22 MED ORDER — ACETAMINOPHEN 325 MG PO TABS
ORAL_TABLET | ORAL | Status: AC
  Filled 2015-05-22: qty 2

## 2015-05-22 MED ORDER — SODIUM CHLORIDE 0.9 % IV SOLN
250.0000 mL | Freq: Once | INTRAVENOUS | Status: AC
  Administered 2015-05-22: 250 mL via INTRAVENOUS

## 2015-05-22 MED ORDER — ACETAMINOPHEN 325 MG PO TABS
650.0000 mg | ORAL_TABLET | Freq: Once | ORAL | Status: AC
  Administered 2015-05-22: 650 mg via ORAL

## 2015-05-22 NOTE — Assessment & Plan Note (Signed)
He has significant drop off his platelet count. I suspect it could be a progression of his bone marrow disease. In the meantime, I told him to hold off taking aspirin therapy until his platelet count rebound to greater than 50,000. About from bruising, he is not symptomatic with bleeding. He does not require platelet transfusion today.

## 2015-05-22 NOTE — Telephone Encounter (Signed)
Gave pt sched on sched card.Marland Kitchenepic is down...Marland KitchenMarland Kitchen

## 2015-05-22 NOTE — Assessment & Plan Note (Signed)
He has significant hypotension and is symptomatic. I recommend he discontinue his diuretics.

## 2015-05-22 NOTE — Assessment & Plan Note (Signed)
We discussed some of the risks, benefits, and alternatives of blood transfusions. The patient is symptomatic from anemia and the hemoglobin level is critically low.  Some of the side-effects to be expected including risks of transfusion reactions, chills, infection, syndrome of volume overload and risk of hospitalization from various reasons and the patient is willing to proceed and went ahead to sign consent today.  

## 2015-05-22 NOTE — Progress Notes (Signed)
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 He feels well up apart from dizziness and fatigue. Denies any chest pain or increasing shortness breath. He bruises easily. The patient denies any recent signs or symptoms of bleeding such as spontaneous epistaxis, hematuria or hematochezia.   REVIEW OF SYSTEMS:   Constitutional: Denies fevers, chills or abnormal weight loss Eyes: Denies blurriness of vision Ears, nose, mouth, throat, and face: Denies mucositis or sore throat Respiratory: Denies cough, dyspnea or wheezes Cardiovascular: Denies palpitation, chest discomfort or lower extremity swelling Gastrointestinal:  Denies nausea, heartburn or change in bowel habits Skin: Denies abnormal skin rashes Lymphatics: Denies new lymphadenopathy Neurological:Denies numbness, tingling or new weaknesses Behavioral/Psych: Mood is stable, no new changes  All other systems were reviewed with the patient and are negative.  I have reviewed the past medical history, past surgical history, social history and family history with the patient and they are unchanged from previous note.  ALLERGIES:  has No Known Allergies.  MEDICATIONS:  Current Outpatient Prescriptions  Medication Sig Dispense Refill   . acetaminophen (TYLENOL) 500 MG tablet Take 1,000 mg by mouth every 6 (six) hours as needed for pain.    Marland Kitchen alendronate (FOSAMAX) 70 MG tablet Take 70 mg by mouth once a week. On Friday. Take with a full glass of water on an empty stomach.    Marland Kitchen allopurinol (ZYLOPRIM) 100 MG tablet Take 100 mg by mouth daily.    Marland Kitchen ALPRAZolam (XANAX) 0.25 MG tablet Take 0.125 mg by mouth 2 (two) times daily as needed for anxiety.   0  . atorvastatin (LIPITOR) 20 MG tablet Take 1 tablet (20 mg total) by mouth daily. 90 tablet 3  . Calcium-Vitamin D (CALTRATE 600 PLUS-VIT D PO) Take 1 tablet by mouth 2 (two) times daily.     . Darbepoetin Alfa (ARANESP) 500 MCG/ML SOSY injection Inject 500 mcg into the skin every 21 ( twenty-one) days.    Marland Kitchen loratadine (CLARITIN) 10 MG tablet Take 10 mg by mouth daily.    . metoprolol (LOPRESSOR) 50 MG tablet take 1/2 tablet by mouth twice a day 30 tablet 6  . MULTIPLE VITAMINS-MINERALS PO Take 1 tablet by mouth 2 (two) times daily.    Marland Kitchen NITROSTAT 0.4 MG SL tablet Place 0.4 mg under the tongue every 5 (five) minutes as needed.   0  . Omega-3 Fatty Acids (FISH OIL) 1000 MG CAPS Take 2,000 mg by mouth 2 (two) times daily.    . predniSONE (DELTASONE) 5 MG tablet Take 7.5 mg by mouth daily.     . traMADol (ULTRAM) 50 MG tablet Take 25 mg by mouth every 4 (four) hours as needed for pain.     Marland Kitchen lisinopril-hydrochlorothiazide (PRINZIDE,ZESTORETIC) 20-25 MG per tablet Take 1 tablet by mouth daily.  1   No current facility-administered medications for this visit.  PHYSICAL EXAMINATION: ECOG PERFORMANCE STATUS: 1 - Symptomatic but completely ambulatory  Filed Vitals:   05/22/15 0900  BP: 103/44  Pulse: 60  Temp: 98.1 F (36.7 C)  Resp: 18   Filed Weights   05/22/15 0900  Weight: 182 lb 3.2 oz (82.645 kg)    GENERAL:alert, no distress and comfortable SKIN: skin color, texture, turgor are normal, no rashes or significant lesions. Noted skin bruising EYES: normal, Conjunctiva  are pink and non-injected, sclera clear Musculoskeletal:no cyanosis of digits and no clubbing  NEURO: alert & oriented x 3 with fluent speech, no focal motor/sensory deficits  LABORATORY DATA:  I have reviewed the data as listed    Component Value Date/Time   NA 136 02/03/2015 1219   NA 138 05/30/2014 1138   K 4.6 02/03/2015 1219   K 3.7 05/30/2014 1138   CL 103 02/03/2015 1219   CL 102 04/09/2013 1347   CO2 27 02/03/2015 1219   CO2 24 05/30/2014 1138   GLUCOSE 109* 02/03/2015 1219   GLUCOSE 147* 05/30/2014 1138   GLUCOSE 113* 04/09/2013 1347   BUN 32* 02/03/2015 1219   BUN 26.2* 05/30/2014 1138   CREATININE 1.00 02/03/2015 1219   CREATININE 0.8 05/30/2014 1138   CALCIUM 9.7 02/03/2015 1219   CALCIUM 9.8 05/30/2014 1138   PROT 7.3 04/21/2015 0908   PROT 7.4 11/22/2013 0847   ALBUMIN 4.0 04/21/2015 0908   ALBUMIN 4.1 11/22/2013 0847   AST 20 04/21/2015 0908   AST 15 11/22/2013 0847   ALT 23 04/21/2015 0908   ALT 15 11/22/2013 0847   ALKPHOS 67 04/21/2015 0908   ALKPHOS 48 11/22/2013 0847   BILITOT 1.0 04/21/2015 0908   BILITOT 1.15 11/22/2013 0847   GFRNONAA 83* 01/21/2015 0406   GFRAA >90 01/21/2015 0406    No results found for: SPEP, UPEP  Lab Results  Component Value Date   WBC 6.7 05/22/2015   NEUTROABS 4.8 05/22/2015   HGB 6.5* 05/22/2015   HCT 20.9* 05/22/2015   MCV 76.8* 05/22/2015   PLT 45* 05/22/2015      Chemistry      Component Value Date/Time   NA 136 02/03/2015 1219   NA 138 05/30/2014 1138   K 4.6 02/03/2015 1219   K 3.7 05/30/2014 1138   CL 103 02/03/2015 1219   CL 102 04/09/2013 1347   CO2 27 02/03/2015 1219   CO2 24 05/30/2014 1138   BUN 32* 02/03/2015 1219   BUN 26.2* 05/30/2014 1138   CREATININE 1.00 02/03/2015 1219   CREATININE 0.8 05/30/2014 1138      Component Value Date/Time   CALCIUM 9.7 02/03/2015 1219   CALCIUM 9.8 05/30/2014 1138   ALKPHOS 67 04/21/2015 0908   ALKPHOS 48 11/22/2013 0847   AST 20 04/21/2015 0908   AST  15 11/22/2013 0847   ALT 23 04/21/2015 0908   ALT 15 11/22/2013 0847   BILITOT 1.0 04/21/2015 0908   BILITOT 1.15 11/22/2013 0847     ASSESSMENT & PLAN:  MDS (myelodysplastic syndrome), low grade I have a long discussion with him and his wife. We reviewed the current NCCN guidelines and his prognostic scores. We discussed options. I would like to increase darbepoetin injection to every [redacted] weeks along with transfusion support. Previously, the addition of prednisone did not increase response rate. I recommend second opinion at [facility] and he agreed to proceed.  Anemia in neoplastic disease We discussed some of the risks, benefits, and alternatives of blood transfusions. The patient is  symptomatic from anemia and the hemoglobin level is critically low.  Some of the side-effects to be expected including risks of transfusion reactions, chills, infection, syndrome of volume overload and risk of hospitalization from various reasons and the patient is willing to proceed and went ahead to sign consent today.   Thrombocytopenia He has significant drop off his platelet count. I suspect it could be a progression of his bone marrow disease. In the meantime, I told him to hold off taking aspirin therapy until his platelet count rebound to greater than 50,000. About from bruising, he is not symptomatic with bleeding. He does not require platelet transfusion today.     Hypotension due to drugs He has significant hypotension and is symptomatic. I recommend he discontinue his diuretics.   No orders of the defined types were placed in this encounter.   All questions were answered. The patient knows to call the clinic with any problems, questions or concerns. No barriers to learning was detected. I spent 30 minutes counseling the patient face to face. The total time spent in the appointment was 40 minutes and more than 50% was on counseling and review of test results     [facility], Budd Frei[place]uth,  MD 05/22/2015 10:30 AM

## 2015-05-22 NOTE — Assessment & Plan Note (Signed)
I have a long discussion with him and his wife. We reviewed the current NCCN guidelines and his prognostic scores. We discussed options. I would like to increase darbepoetin injection to every [redacted] weeks along with transfusion support. Previously, the addition of prednisone did not increase response rate. I recommend second opinion at [facility] and he agreed to proceed.

## 2015-05-22 NOTE — Patient Instructions (Signed)

## 2015-05-23 ENCOUNTER — Telehealth: Payer: Self-pay | Admitting: Hematology and Oncology

## 2015-05-23 ENCOUNTER — Ambulatory Visit (HOSPITAL_COMMUNITY): Payer: Medicare Other

## 2015-05-23 ENCOUNTER — Encounter (HOSPITAL_COMMUNITY)
Admission: RE | Admit: 2015-05-23 | Discharge: 2015-05-23 | Disposition: A | Discharge status: Home / Self Care | Payer: Medicare Other | Source: Ambulatory Visit | Attending: Interventional Cardiology | Admitting: Interventional Cardiology

## 2015-05-23 DIAGNOSIS — Z951 Presence of aortocoronary bypass graft: Secondary | ICD-10-CM | POA: Diagnosis not present

## 2015-05-23 LAB — TYPE AND SCREEN
ABO/RH(D): O POS
Antibody Screen: NEGATIVE
Unit division: 0
Unit division: 0

## 2015-05-23 NOTE — Telephone Encounter (Signed)
Pt appt. With Dr. Phill Myron @ [facility] is 06/04/15. Medical records faxed. Pt is aware

## 2015-05-26 ENCOUNTER — Telehealth (HOSPITAL_COMMUNITY): Payer: Self-pay | Admitting: *Deleted

## 2015-05-26 ENCOUNTER — Ambulatory Visit (HOSPITAL_COMMUNITY): Payer: Medicare Other

## 2015-05-26 ENCOUNTER — Encounter (HOSPITAL_COMMUNITY)
Admission: RE | Admit: 2015-05-26 | Discharge: 2015-05-26 | Disposition: A | Discharge status: Home / Self Care | Payer: Medicare Other | Source: Ambulatory Visit | Attending: Interventional Cardiology | Admitting: Interventional Cardiology

## 2015-05-26 ENCOUNTER — Telehealth: Payer: Self-pay | Admitting: *Deleted

## 2015-05-26 DIAGNOSIS — Z951 Presence of aortocoronary bypass graft: Secondary | ICD-10-CM | POA: Diagnosis not present

## 2015-05-26 LAB — ERYTHROPOIETIN: ERYTHROPOIETIN: 115.6 m[IU]/mL — AB (ref 2.6–18.5)

## 2015-05-26 NOTE — Progress Notes (Signed)
Bp pre exercise 146/70 post 142/80, during exercise upper 140's to low 150's   Reviewed pt office last office note. The Lisinopril-Hctz was d/c'd due to symptomatic hypotension. Pt was concerned because he usually doesn't have high readings. Will send fax to Dr. Alvy Bimler for review. Cherre Huger, BSN

## 2015-05-26 NOTE — Telephone Encounter (Signed)
-----   Message from Heath Lark, MD sent at 05/26/2015  9:22 AM EDT ----- Regarding: RE: FYI - sending you patients cardiac rehab report Thanks for the update  Jeremie Giangrande, please call patient to resume his BP medication (Zestoretic) in the evening ----- Message -----    From: Rowe Pavy, RN    Sent: 05/26/2015   8:58 AM      To: Heath Lark, MD Subject: Juluis Rainier - sending you patients cardiac rehab rep#    Good morning Dr. Mercer Pod I will fax to you Jeromey's rehab report today with elevated bp noted during exercise. Bp pre exercise 146/70 post 142/80, during exercise upper 140's to low 150's  I did see in your office note that his Lisinopril-Hctz was d/c'd due to symptomatic hypotension.  Pt was concerned because he usually doesn't have high readings.   Thanks  Carlette

## 2015-05-26 NOTE — Telephone Encounter (Signed)
Pt notified of message below. Verbalized understanding.  Pt reports he had a fever 102 Thursday after blood transfusion, took tylenol. Saturday temp was 101.4, no fever Sunday or Monday. Dr Alvy Bimler notified.

## 2015-05-26 NOTE — Telephone Encounter (Signed)
-----   Message from Heath Lark, MD sent at 05/26/2015  9:22 AM EDT ----- Regarding: RE: FYI - sending you patients cardiac rehab report Thanks for the update  Tammi, please call patient to resume his BP medication (Zestoretic) in the evening ----- Message -----    From: Rowe Pavy, RN    Sent: 05/26/2015   8:58 AM      To: Heath Lark, MD Subject: Juluis Rainier - sending you patients cardiac rehab rep#    Good morning Dr. Mercer Pod I will fax to you Traivon's rehab report today with elevated bp noted during exercise. Bp pre exercise 146/70 post 142/80, during exercise upper 140's to low 150's  I did see in your office note that his Lisinopril-Hctz was d/c'd due to symptomatic hypotension.  Pt was concerned because he usually doesn't have high readings.   Thanks  Carlette

## 2015-05-28 ENCOUNTER — Ambulatory Visit (HOSPITAL_COMMUNITY): Payer: Medicare Other

## 2015-05-28 ENCOUNTER — Encounter (HOSPITAL_COMMUNITY)
Admission: RE | Admit: 2015-05-28 | Discharge: 2015-05-28 | Disposition: A | Discharge status: Home / Self Care | Payer: Medicare Other | Source: Ambulatory Visit | Attending: Interventional Cardiology | Admitting: Interventional Cardiology

## 2015-05-28 DIAGNOSIS — Z951 Presence of aortocoronary bypass graft: Secondary | ICD-10-CM | POA: Diagnosis not present

## 2015-05-28 NOTE — Progress Notes (Signed)
  30 day Psychosocial followup assessment  Patient psychosocial assessment reveals no barriers to cardiac rehab participation.  Pt exhibits positive coping skills to deal with psychosocial concerns. Pt has a supportive wife.  Pt has chronic anemia that requires frequent office visit, lab test and injections.  Pt deals with this very well and relies on his faith as a main support.  Patient  Feels he is  making progress towards cardiac rehab goals.  Patient reports health and activity level greatly improved in the past 30 days as evidenced by patient's reports of increased ability to perform activities of daily living with little ease.   Patient reports feeling positive about current and projected progress toward cardiac rehab goals.  Patient's rate of progress towards goals is excellent.  Plan of action to help patient continue to work towards rehab goals include continued home exercise adherence with consistency and attend cardiac education classes.  Will continue to monitor and evaluate progress toward psychosocial goals. Cherre Huger, BSN

## 2015-05-30 ENCOUNTER — Encounter (HOSPITAL_COMMUNITY)
Admission: RE | Admit: 2015-05-30 | Discharge: 2015-05-30 | Disposition: A | Discharge status: Home / Self Care | Payer: Medicare Other | Source: Ambulatory Visit | Attending: Interventional Cardiology | Admitting: Interventional Cardiology

## 2015-05-30 ENCOUNTER — Ambulatory Visit (HOSPITAL_COMMUNITY): Payer: Medicare Other

## 2015-05-30 ENCOUNTER — Encounter (HOSPITAL_COMMUNITY): Payer: Self-pay

## 2015-05-30 DIAGNOSIS — Z951 Presence of aortocoronary bypass graft: Secondary | ICD-10-CM | POA: Diagnosis not present

## 2015-06-02 ENCOUNTER — Ambulatory Visit (HOSPITAL_COMMUNITY): Payer: Medicare Other

## 2015-06-02 ENCOUNTER — Encounter (HOSPITAL_COMMUNITY)
Admission: RE | Admit: 2015-06-02 | Discharge: 2015-06-02 | Disposition: A | Discharge status: Home / Self Care | Payer: Medicare Other | Source: Ambulatory Visit | Attending: Interventional Cardiology | Admitting: Interventional Cardiology

## 2015-06-02 DIAGNOSIS — Z951 Presence of aortocoronary bypass graft: Secondary | ICD-10-CM | POA: Diagnosis not present

## 2015-06-03 ENCOUNTER — Emergency Department (HOSPITAL_COMMUNITY): Payer: Medicare Other

## 2015-06-03 ENCOUNTER — Emergency Department (HOSPITAL_COMMUNITY)
Admission: EM | Admit: 2015-06-03 | Discharge: 2015-06-03 | Disposition: A | Discharge status: Home / Self Care | Payer: Medicare Other | Attending: Emergency Medicine | Admitting: Emergency Medicine

## 2015-06-03 ENCOUNTER — Other Ambulatory Visit: Payer: Self-pay

## 2015-06-03 ENCOUNTER — Encounter (HOSPITAL_COMMUNITY): Payer: Self-pay

## 2015-06-03 DIAGNOSIS — Z9889 Other specified postprocedural states: Secondary | ICD-10-CM | POA: Diagnosis not present

## 2015-06-03 DIAGNOSIS — M069 Rheumatoid arthritis, unspecified: Secondary | ICD-10-CM | POA: Diagnosis not present

## 2015-06-03 DIAGNOSIS — I2510 Atherosclerotic heart disease of native coronary artery without angina pectoris: Secondary | ICD-10-CM | POA: Diagnosis not present

## 2015-06-03 DIAGNOSIS — D735 Infarction of spleen: Secondary | ICD-10-CM | POA: Insufficient documentation

## 2015-06-03 DIAGNOSIS — Z951 Presence of aortocoronary bypass graft: Secondary | ICD-10-CM | POA: Insufficient documentation

## 2015-06-03 DIAGNOSIS — Z8739 Personal history of other diseases of the musculoskeletal system and connective tissue: Secondary | ICD-10-CM | POA: Diagnosis not present

## 2015-06-03 DIAGNOSIS — R509 Fever, unspecified: Secondary | ICD-10-CM | POA: Insufficient documentation

## 2015-06-03 DIAGNOSIS — Z7952 Long term (current) use of systemic steroids: Secondary | ICD-10-CM | POA: Diagnosis not present

## 2015-06-03 DIAGNOSIS — R1012 Left upper quadrant pain: Secondary | ICD-10-CM | POA: Insufficient documentation

## 2015-06-03 DIAGNOSIS — Z79899 Other long term (current) drug therapy: Secondary | ICD-10-CM | POA: Diagnosis not present

## 2015-06-03 DIAGNOSIS — Z8709 Personal history of other diseases of the respiratory system: Secondary | ICD-10-CM | POA: Insufficient documentation

## 2015-06-03 DIAGNOSIS — R110 Nausea: Secondary | ICD-10-CM | POA: Diagnosis not present

## 2015-06-03 DIAGNOSIS — I10 Essential (primary) hypertension: Secondary | ICD-10-CM | POA: Diagnosis not present

## 2015-06-03 DIAGNOSIS — R630 Anorexia: Secondary | ICD-10-CM | POA: Diagnosis not present

## 2015-06-03 DIAGNOSIS — Z862 Personal history of diseases of the blood and blood-forming organs and certain disorders involving the immune mechanism: Secondary | ICD-10-CM | POA: Diagnosis not present

## 2015-06-03 DIAGNOSIS — R109 Unspecified abdominal pain: Secondary | ICD-10-CM

## 2015-06-03 DIAGNOSIS — Z87891 Personal history of nicotine dependence: Secondary | ICD-10-CM | POA: Insufficient documentation

## 2015-06-03 LAB — CBC WITH DIFFERENTIAL/PLATELET
BASOS ABS: 0.1 10*3/uL (ref 0.0–0.1)
BASOS PCT: 1 % (ref 0–1)
EOS ABS: 0 10*3/uL (ref 0.0–0.7)
Eosinophils Relative: 0 % (ref 0–5)
HCT: 22.9 % — ABNORMAL LOW (ref 39.0–52.0)
Hemoglobin: 7.2 g/dL — ABNORMAL LOW (ref 13.0–17.0)
LYMPHS PCT: 23 % (ref 12–46)
Lymphs Abs: 1.3 10*3/uL (ref 0.7–4.0)
MCH: 24 pg — ABNORMAL LOW (ref 26.0–34.0)
MCHC: 31.4 g/dL (ref 30.0–36.0)
MCV: 76.3 fL — ABNORMAL LOW (ref 78.0–100.0)
MONO ABS: 1.5 10*3/uL — AB (ref 0.1–1.0)
Monocytes Relative: 27 % — ABNORMAL HIGH (ref 3–12)
NEUTROS PCT: 49 % (ref 43–77)
Neutro Abs: 2.6 10*3/uL (ref 1.7–7.7)
PLATELETS: 34 10*3/uL — AB (ref 150–400)
RBC: 3 MIL/uL — AB (ref 4.22–5.81)
RDW: 23.8 % — ABNORMAL HIGH (ref 11.5–15.5)
WBC: 5.5 10*3/uL (ref 4.0–10.5)

## 2015-06-03 LAB — COMPREHENSIVE METABOLIC PANEL
ALBUMIN: 3.6 g/dL (ref 3.5–5.0)
ALT: 18 U/L (ref 17–63)
ANION GAP: 9 (ref 5–15)
AST: 22 U/L (ref 15–41)
Alkaline Phosphatase: 65 U/L (ref 38–126)
BILIRUBIN TOTAL: 1.2 mg/dL (ref 0.3–1.2)
BUN: 33 mg/dL — ABNORMAL HIGH (ref 6–20)
CHLORIDE: 104 mmol/L (ref 101–111)
CO2: 26 mmol/L (ref 22–32)
CREATININE: 1.03 mg/dL (ref 0.61–1.24)
Calcium: 9.2 mg/dL (ref 8.9–10.3)
GFR calc Af Amer: >60 mL/min (ref >60)
GFR calc non Af Amer: >60 mL/min (ref >60)
Glucose, Bld: 97 mg/dL (ref 65–99)
POTASSIUM: 4.4 mmol/L (ref 3.5–5.1)
Sodium: 139 mmol/L (ref 135–145)
Total Protein: 7.3 g/dL (ref 6.5–8.1)

## 2015-06-03 LAB — URINALYSIS, ROUTINE W REFLEX MICROSCOPIC
BILIRUBIN URINE: NEGATIVE
Glucose, UA: NEGATIVE mg/dL
Hgb urine dipstick: NEGATIVE
Ketones, ur: NEGATIVE mg/dL
Leukocytes, UA: NEGATIVE
Nitrite: NEGATIVE
Protein, ur: NEGATIVE mg/dL
SPECIFIC GRAVITY, URINE: 1.038 — AB (ref 1.005–1.030)
UROBILINOGEN UA: 0.2 mg/dL (ref 0.0–1.0)
pH: 5.5 (ref 5.0–8.0)

## 2015-06-03 LAB — LIPASE, BLOOD: Lipase: 15 U/L — ABNORMAL LOW (ref 22–51)

## 2015-06-03 LAB — I-STAT TROPONIN, ED: TROPONIN I, POC: 0 ng/mL (ref 0.00–0.08)

## 2015-06-03 MED ORDER — ONDANSETRON 4 MG PO TBDP: ORAL_TABLET | ORAL | Status: DC

## 2015-06-03 MED ORDER — IOHEXOL 300 MG/ML  SOLN
50.0000 mL | Freq: Once | INTRAMUSCULAR | Status: AC | PRN
  Administered 2015-06-03: 50 mL via ORAL

## 2015-06-03 MED ORDER — IOHEXOL 300 MG/ML  SOLN
100.0000 mL | Freq: Once | INTRAMUSCULAR | Status: AC | PRN
  Administered 2015-06-03: 100 mL via INTRAVENOUS

## 2015-06-03 MED ORDER — GI COCKTAIL ~~LOC~~
30.0000 mL | Freq: Once | ORAL | Status: AC
  Administered 2015-06-03: 30 mL via ORAL
  Filled 2015-06-03: qty 30

## 2015-06-03 NOTE — ED Notes (Signed)
Pt c/o intermittent fever x 2 weeks and intermittent sharp LUQ/L rib cage pain and nausea x 2 days.  Pain score 5/10.  Pt reports Hx of gallstones.

## 2015-06-03 NOTE — Discharge Instructions (Signed)

## 2015-06-03 NOTE — ED Provider Notes (Signed)
CSN: [id number]     Arrival date & time 06/03/15  0844 History   First MD Initiated Contact with Patient 06/03/15 [phone]     Chief Complaint  Patient presents with  . Abdominal Pain  . Nausea     (Consider location/radiation/quality/duration/timing/severity/associated sxs/prior Treatment) Patient is a 69 y.o. male presenting with abdominal pain.  Abdominal Pain Pain location:  LUQ Pain quality: cramping and sharp   Pain radiates to:  Does not radiate Pain severity:  Moderate Onset quality:  Gradual Duration:  2 weeks Timing:  Constant Progression:  Worsening Chronicity:  New Context: not recent illness and not suspicious food intake   Relieved by:  Nothing Worsened by:  Nothing tried Ineffective treatments:  None tried Associated symptoms: anorexia, fever (tmax 102 1 week ago) and nausea   Associated symptoms: no diarrhea, no dysuria and no vomiting     Past Medical History  Diagnosis Date  . MDS (myelodysplastic syndrome), low grade 2012    on observation; MDS FISH panel on 04/26/2013 was normal; cytogenetics were also negative.   . Rheumatoid arthritis(714.0)     was on MTX until MDS dx  . Hypertension   . Anemia   . Family history of ischemic heart disease   . Shortness of breath     exertional  . Dysrhythmia   . Tachycardia 2013    evaluated by Dr Irish Lack @ [facility]  . Gout   . Needs flu shot 08/23/2013  . Headache 03/07/2014  . CAD (coronary artery disease), native coronary artery 01/09/2015    3 v dz, EF 50%   Past Surgical History  Procedure Laterality Date  . Knee arthroscopy Left   . Hernia repair    . Umbilical hernia repair    . Circumcision    . Total knee arthroplasty Right 02/02/2013    Procedure: TOTAL KNEE ARTHROPLASTY;  Surgeon: Yvette Rack., MD;  Location: [place];  Service: Orthopedics;  Laterality: Right;  RIGHT ARTHROPLASTY KNEE MEDIAL/LATERAL COMPARTMENTS WITH PATELLA RESURFACING   . Left heart catheterization with coronary angiogram N/A  01/09/2015    Procedure: LEFT HEART CATHETERIZATION WITH CORONARY ANGIOGRAM;  Surgeon: Burnell Blanks, MD;  LAD 99/90%, D1 50%, CFX 40%, OM 70%, OM branch 80%, RCA  50%, dRCA 99%, PDA 50%, EF 50%  . Coronary artery bypass graft N/A 01/13/2015    Procedure: CORONARY ARTERY BYPASS GRAFTING (CABG) times four, using left internal mammary artery and left greater saphenous vein.;  Surgeon: Ivin Poot, MD;  Location: [place];  Service: Open Heart Surgery;  Laterality: N/A;  . Tee without cardioversion N/A 01/13/2015    Procedure: TRANSESOPHAGEAL ECHOCARDIOGRAM (TEE);  Surgeon: Ivin Poot, MD;  Location: [place];  Service: Open Heart Surgery;  Laterality: N/A;   Family History  Problem Relation Age of Onset  . Brain cancer Mother   . Heart disease Father     open heart surgery  . Breast cancer Sister   . Heart disease Brother     open heart surgery  . Diabetes Brother   . Heart attack Father   . Stroke Neg Hx   . Heart attack Brother    History  Substance Use Topics  . Smoking status: Former Smoker    Types: Cigarettes    Quit date: 01/26/1970  . Smokeless tobacco: Never Used  . Alcohol Use: No    Review of Systems  Constitutional: Positive for fever (tmax 102 1 week ago).  Gastrointestinal: Positive for nausea, abdominal  pain and anorexia. Negative for vomiting and diarrhea.  Genitourinary: Negative for dysuria.  All other systems reviewed and are negative.     Allergies  Review of patient's allergies indicates no known allergies.  Home Medications   Prior to Admission medications   Medication Sig Start Date End Date Taking? Authorizing Provider  acetaminophen (TYLENOL) 500 MG tablet Take 1,000 mg by mouth every 6 (six) hours as needed for pain.   Yes Historical Provider, MD  alendronate (FOSAMAX) 70 MG tablet Take 70 mg by mouth once a week. On Friday. Take with a full glass of water on an empty stomach.   Yes Historical Provider, MD  allopurinol (ZYLOPRIM) 100 MG  tablet Take 100 mg by mouth daily. 04/14/13  Yes Historical Provider, MD  ALPRAZolam Duanne Moron) 0.25 MG tablet Take 0.125 mg by mouth 2 (two) times daily as needed for anxiety.  12/30/14  Yes Historical Provider, MD  atorvastatin (LIPITOR) 20 MG tablet Take 1 tablet (20 mg total) by mouth daily. 04/23/15  Yes Jettie Booze, MD  Calcium-Vitamin D (CALTRATE 600 PLUS-VIT D PO) Take 1 tablet by mouth 2 (two) times daily.    Yes Historical Provider, MD  Darbepoetin Alfa (ARANESP) 500 MCG/ML SOSY injection Inject 500 mcg into the skin every 14 (fourteen) days.    Yes Historical Provider, MD  lisinopril-hydrochlorothiazide (PRINZIDE,ZESTORETIC) 20-25 MG per tablet Take 0.5 tablets by mouth daily.  05/15/15  Yes Historical Provider, MD  loratadine (CLARITIN) 10 MG tablet Take 10 mg by mouth daily.   Yes Historical Provider, MD  metoprolol (LOPRESSOR) 50 MG tablet take 1/2 tablet by mouth twice a day 01/30/15  Yes Jettie Booze, MD  MULTIPLE VITAMINS-MINERALS PO Take 1 tablet by mouth 2 (two) times daily.   Yes Historical Provider, MD  Omega-3 Fatty Acids (FISH OIL) 1000 MG CAPS Take 2,000 mg by mouth 2 (two) times daily.   Yes Historical Provider, MD  predniSONE (DELTASONE) 5 MG tablet Take 7.5 mg by mouth daily.    Yes Historical Provider, MD  traMADol (ULTRAM) 50 MG tablet Take 25 mg by mouth every 4 (four) hours as needed for pain.    Yes Historical Provider, MD  NITROSTAT 0.4 MG SL tablet Place 0.4 mg under the tongue every 5 (five) minutes as needed.  12/31/14   Historical Provider, MD  ondansetron (ZOFRAN ODT) 4 MG disintegrating tablet 4mg  ODT q4 hours prn nausea/vomit 06/03/15   Debby Freiberg, MD   BP 104/61 mmHg  Pulse 67  Temp(Src) 98.1 F (36.7 C) (Oral)  Resp 18  SpO2 98% Physical Exam  Constitutional: He is oriented to person, place, and time. He appears well-developed and well-nourished.  HENT:  Head: Normocephalic and atraumatic.  Eyes: Conjunctivae and EOM are normal.  Neck:  Normal range of motion. Neck supple.  Cardiovascular: Normal rate, regular rhythm and normal heart sounds.   Pulmonary/Chest: Effort normal and breath sounds normal. No respiratory distress.  Abdominal: He exhibits no distension. There is tenderness in the left upper quadrant. There is no rebound and no guarding.  Musculoskeletal: Normal range of motion.  Neurological: He is alert and oriented to person, place, and time.  Skin: Skin is warm and dry.  Vitals reviewed.   ED Course  Procedures (including critical care time) Labs Review Labs Reviewed  COMPREHENSIVE METABOLIC PANEL - Abnormal; Notable for the following:    BUN 33 (*)    All other components within normal limits  CBC WITH DIFFERENTIAL/PLATELET - Abnormal; Notable for  the following:    RBC 3.00 (*)    Hemoglobin 7.2 (*)    HCT 22.9 (*)    MCV 76.3 (*)    MCH 24.0 (*)    RDW 23.8 (*)    Platelets 34 (*)    Monocytes Relative 27 (*)    Monocytes Absolute 1.5 (*)    All other components within normal limits  LIPASE, BLOOD - Abnormal; Notable for the following:    Lipase 15 (*)    All other components within normal limits  URINALYSIS, ROUTINE W REFLEX MICROSCOPIC (NOT AT [facility]) - Abnormal; Notable for the following:    Color, Urine AMBER (*)    Specific Gravity, Urine 1.038 (*)    All other components within normal limits  I-STAT TROPOININ, ED    Imaging Review Dg Chest 2 View  06/03/2015   CLINICAL DATA:  Fever.  EXAM: CHEST  2 VIEW  COMPARISON:  February 19, 2015.  FINDINGS: The heart size and mediastinal contours are within normal limits. Both lungs are clear. Status post coronary artery bypass graft. No pneumothorax or pleural effusion is noted. The visualized skeletal structures are unremarkable.  IMPRESSION: No active cardiopulmonary disease.   Electronically Signed   By: Marijo Conception, M.D.   On: 06/03/2015 09:54   Ct Abdomen Pelvis W Contrast  06/03/2015   CLINICAL DATA:  Intermittent left upper quadrant pain  for 3 days  EXAM: CT ABDOMEN AND PELVIS WITH CONTRAST  TECHNIQUE: Multidetector CT imaging of the abdomen and pelvis was performed using the standard protocol following bolus administration of intravenous contrast.  CONTRAST:  197mL OMNIPAQUE IOHEXOL 300 MG/ML  SOLN  COMPARISON:  Images of the upper abdomen CT thorax 01/10/2015  FINDINGS: Lung bases are unremarkable. Again noted atherosclerotic calcifications of coronary arteries. Sagittal images of the spine shows degenerative changes lumbar spine. Significant disc space flattening with vacuum disc phenomenon at L3-L4-L4-L5 and L5-S1 level.  Enhanced liver is unremarkable. No calcified gallstones are noted in gallbladder fundal region. No pericholecystic fluid. No thickening of gallbladder wall. Enhanced pancreas and right adrenal gland is unremarkable. There is a low-density nodule left adrenal gland measures 1.3 cm. This probable represents an adenoma. Follow-up examination in 3 months is suggested to assure stability there is splenomegaly. The spleen measures 16.6 by 8.6 cm. There is a triangular-shaped low-density lesion anterior inferior aspect of the spleen measures 3.8 by 2.1 cm. There is a second triangular shape low-density lesion inferior aspect of the spleen best seen in coronal image 55 measures 3.7 by 3 cm. There is small amount of adjacent subcapsular fluid/ stranding. Findings most likely represent splenic infarcts. Less likely multifocal infection. There is no evidence of peripheral enhancement. No evidence of acute hemorrhage.  Atherosclerotic calcifications of abdominal aorta and iliac arteries. No aortic aneurysm. Enhanced kidneys are symmetrical in size. No hydronephrosis or hydroureter. Delayed renal images shows bilateral renal symmetrical excretion.  No small bowel obstruction. No ascites or free air. No adenopathy. There is no pericecal inflammation normal appendix partially visualized axial image 58. Moderate stool noted in descending colon  and proximal sigmoid colon. Nonspecific mild thickening of anterior wall of urinary bladder. Mild cystitis cannot be excluded. Prostate gland and seminal vesicles are unremarkable. Tiny bilateral inguinal canal hernia containing fat without evidence of acute complication. There is a subcutaneous cyst in left posterior gluteal region measures 2.8 cm. This may represent a sebaceous cyst. Clinical correlation is necessary.  IMPRESSION: 1. Non calcified gallstones are noted within fundus  of the gallbladder. No pericholecystic fluid. 2. There are 2 low-density lesions within inferior and anterior aspect of the spleen. These are triangular-shaped more anterior lesion measures 3.8 by 2.1 cm. Second lesion mid aspect of the spleen inferiorly measures 3.7 x 3 cm. There is small amount of adjacent subcapsular stranding/fluid. Findings most likely represent splenic infarcts. Less likely multifocal infection or hemorrhage. There is no peripheral enhancement. Splenomegaly is noted. 3. Low density lesion within left adrenal gland measures 1.3 cm. This most likely represents an adenoma. Follow-up CT scan in 3 months is suggested to assure stability. 4. No small bowel obstruction. 5. No pericecal inflammation.  Normal appendix. 6. Moderate stool noted in descending colon and proximal sigmoid colon. 7. Nonspecific mild thickening of urinary bladder wall. Mild cystitis cannot be excluded. These results were called by telephone at the time of interpretation on 06/03/2015 at 11:19 am to Dr. Debby Freiberg , who verbally acknowledged these results.   Electronically Signed   By: Lahoma Crocker M.D.   On: 06/03/2015 11:20     EKG Interpretation None      MDM   Final diagnoses:  Abdominal pain, acute  Splenic infarct    69 y.o. male with pertinent PMH of MDS, prior biliary colic, CAD presents with LUQ abd pain, tenderness as above x 2 weeks, acutely worsening over last 2 days.  Physical exam on arrival today as above.  Wu as  above with splenic infarct, likely due to MDS.  Discussed findings with pt who is symptom free after dilaudid and zofran.  Will dc home with zofran and close PCP, hematology fu.  DC home in stable condition.  I have reviewed all laboratory and imaging studies if ordered as above  1. Splenic infarct   2. Abdominal pain, acute         Debby Freiberg, MD 06/04/15 [phone]

## 2015-06-04 ENCOUNTER — Encounter (HOSPITAL_COMMUNITY): Payer: Medicare Other

## 2015-06-04 ENCOUNTER — Ambulatory Visit (HOSPITAL_COMMUNITY): Payer: Medicare Other

## 2015-06-05 ENCOUNTER — Other Ambulatory Visit (HOSPITAL_BASED_OUTPATIENT_CLINIC_OR_DEPARTMENT_OTHER): Payer: Medicare Other

## 2015-06-05 ENCOUNTER — Other Ambulatory Visit: Payer: Self-pay | Admitting: Hematology and Oncology

## 2015-06-05 ENCOUNTER — Ambulatory Visit (HOSPITAL_BASED_OUTPATIENT_CLINIC_OR_DEPARTMENT_OTHER): Payer: Medicare Other | Admitting: Hematology and Oncology

## 2015-06-05 ENCOUNTER — Ambulatory Visit (HOSPITAL_BASED_OUTPATIENT_CLINIC_OR_DEPARTMENT_OTHER): Payer: Medicare Other

## 2015-06-05 VITALS — BP 115/93 | HR 65 | Temp 97.8°F | Resp 18

## 2015-06-05 VITALS — BP 105/49 | HR 58 | Temp 98.1°F | Resp 20 | Ht 68.0 in

## 2015-06-05 DIAGNOSIS — D630 Anemia in neoplastic disease: Secondary | ICD-10-CM

## 2015-06-05 DIAGNOSIS — D4620 Refractory anemia with excess of blasts, unspecified: Secondary | ICD-10-CM

## 2015-06-05 DIAGNOSIS — D469 Myelodysplastic syndrome, unspecified: Secondary | ICD-10-CM | POA: Diagnosis not present

## 2015-06-05 DIAGNOSIS — D46Z Other myelodysplastic syndromes: Secondary | ICD-10-CM

## 2015-06-05 DIAGNOSIS — I2510 Atherosclerotic heart disease of native coronary artery without angina pectoris: Secondary | ICD-10-CM

## 2015-06-05 DIAGNOSIS — D696 Thrombocytopenia, unspecified: Secondary | ICD-10-CM

## 2015-06-05 LAB — CBC WITH DIFFERENTIAL/PLATELET
BASO%: 0.9 % (ref 0.0–2.0)
Basophils Absolute: 0.1 10*3/uL (ref 0.0–0.1)
EOS%: 0 % (ref 0.0–7.0)
Eosinophils Absolute: 0 10*3/uL (ref 0.0–0.5)
HCT: 22.2 % — ABNORMAL LOW (ref 38.4–49.9)
HGB: 7.1 g/dL — ABNORMAL LOW (ref 13.0–17.1)
LYMPH%: 24.2 % (ref 14.0–49.0)
MCH: 24.2 pg — ABNORMAL LOW (ref 27.2–33.4)
MCHC: 32 g/dL (ref 32.0–36.0)
MCV: 75.8 fL — ABNORMAL LOW (ref 79.3–98.0)
MONO#: 0.7 10*3/uL (ref 0.1–0.9)
MONO%: 11.9 % (ref 0.0–14.0)
NEUT#: 3.6 10*3/uL (ref 1.5–6.5)
NEUT%: 63 % (ref 39.0–75.0)
Platelets: 35 10*3/uL — ABNORMAL LOW (ref 140–400)
RBC: 2.93 10*6/uL — ABNORMAL LOW (ref 4.20–5.82)
RDW: 23.5 % — ABNORMAL HIGH (ref 11.0–14.6)
WBC: 5.7 10*3/uL (ref 4.0–10.3)
lymph#: 1.4 10*3/uL (ref 0.9–3.3)
nRBC: 2 % — ABNORMAL HIGH (ref 0–0)

## 2015-06-05 LAB — HOLD TUBE, BLOOD BANK

## 2015-06-05 LAB — TECHNOLOGIST REVIEW

## 2015-06-05 LAB — PREPARE RBC (CROSSMATCH)

## 2015-06-05 MED ORDER — ACETAMINOPHEN 325 MG PO TABS
ORAL_TABLET | ORAL | Status: AC
  Filled 2015-06-05: qty 2

## 2015-06-05 MED ORDER — DIPHENHYDRAMINE HCL 25 MG PO CAPS
ORAL_CAPSULE | ORAL | Status: AC
Start: 2015-06-05 — End: 2015-06-05
  Filled 2015-06-05: qty 1

## 2015-06-05 MED ORDER — DARBEPOETIN ALFA 500 MCG/ML IJ SOSY
500.0000 ug | PREFILLED_SYRINGE | Freq: Once | INTRAMUSCULAR | Status: AC
  Administered 2015-06-05: 500 ug via SUBCUTANEOUS
  Filled 2015-06-05: qty 1

## 2015-06-05 MED ORDER — SODIUM CHLORIDE 0.9 % IV SOLN
250.0000 mL | Freq: Once | INTRAVENOUS | Status: AC
  Administered 2015-06-05: 250 mL via INTRAVENOUS

## 2015-06-05 MED ORDER — DIPHENHYDRAMINE HCL 25 MG PO CAPS
25.0000 mg | ORAL_CAPSULE | Freq: Once | ORAL | Status: AC
  Administered 2015-06-05: 25 mg via ORAL

## 2015-06-05 MED ORDER — FUROSEMIDE 10 MG/ML IJ SOLN: 20.0000 mg | Freq: Once | INTRAMUSCULAR | Status: DC

## 2015-06-05 MED ORDER — ACETAMINOPHEN 325 MG PO TABS
650.0000 mg | ORAL_TABLET | Freq: Once | ORAL | Status: AC
  Administered 2015-06-05: 650 mg via ORAL

## 2015-06-05 MED ORDER — AMOXICILLIN 500 MG PO TABS: 500.0000 mg | ORAL_TABLET | Freq: Two times a day (BID) | ORAL | Status: DC

## 2015-06-05 NOTE — Progress Notes (Signed)
[place] OFFICE PROGRESS NOTE  Patient Care Team: Jonathon Jordan, MD as PCP - General (Family Medicine) Ronald Lobo, MD (Gastroenterology) Jonathon Jordan, MD as Attending Physician (Family Medicine) Heath Lark, MD as Consulting Physician (Hematology and Oncology)  SUMMARY OF ONCOLOGIC HISTORY: Oncology History   R-IPSS score 3.5     MDS (myelodysplastic syndrome), low grade   04/26/2013 Bone Marrow Biopsy BM biopsy confirmed MDS with multilineage dysplasia, 4% blast, normal cytogenetics.   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 He went to the emergency department complaining of severe LUQ pain and was found to have splenic infarct. He denies pain today He had second opinion at [facility] and had repeat bone marrow biopsy He complained of profound fatigue The patient denies any recent signs or symptoms of bleeding such as spontaneous epistaxis, hematuria or hematochezia.   REVIEW OF SYSTEMS:   Constitutional: Denies fevers, chills or abnormal weight loss Eyes: Denies blurriness of vision Ears, nose, mouth, throat, and face: Denies mucositis or sore throat Respiratory: Denies cough, dyspnea or wheezes Cardiovascular: Denies palpitation, chest discomfort or lower extremity swelling Gastrointestinal:  Denies nausea, heartburn or change in bowel habits Skin: Denies abnormal skin rashes Lymphatics: Denies new lymphadenopathy or easy bruising Neurological:Denies numbness, tingling or new weaknesses Behavioral/Psych: Mood is stable, no new changes  All other systems were reviewed with the patient and are negative.  I have reviewed the past medical history, past surgical history, social history and  family history with the patient and they are unchanged from previous note.  ALLERGIES:  has No Known Allergies.  MEDICATIONS:  Current Outpatient Prescriptions  Medication Sig Dispense Refill  . acetaminophen (TYLENOL) 500 MG tablet Take 1,000 mg by mouth every 6 (six) hours as needed for pain.    Marland Kitchen alendronate (FOSAMAX) 70 MG tablet Take 70 mg by mouth once a week. On Friday. Take with a full glass of water on an empty stomach.    Marland Kitchen allopurinol (ZYLOPRIM) 100 MG tablet Take 100 mg by mouth daily.    Marland Kitchen ALPRAZolam (XANAX) 0.25 MG tablet Take 0.125 mg by mouth 2 (two) times daily as needed for anxiety.   0  . atorvastatin (LIPITOR) 20 MG tablet Take 1 tablet (20 mg total) by mouth daily. 90 tablet 3  . Calcium-Vitamin D (CALTRATE 600 PLUS-VIT D PO) Take 1 tablet by mouth 2 (two) times daily.     . Darbepoetin Alfa (ARANESP) 500 MCG/ML SOSY injection Inject 500 mcg into the skin every 14 (fourteen) days.     Marland Kitchen lisinopril-hydrochlorothiazide (PRINZIDE,ZESTORETIC) 20-25 MG per tablet Take 0.5 tablets by mouth daily.   1  . loratadine (CLARITIN) 10 MG tablet Take 10 mg by mouth daily.    . metoprolol (LOPRESSOR) 50 MG tablet take 1/2 tablet by mouth twice a day 30 tablet 6  . MULTIPLE VITAMINS-MINERALS PO Take 1 tablet by mouth 2 (two) times daily.    Marland Kitchen NITROSTAT 0.4 MG SL tablet Place 0.4 mg under the tongue every 5 (five) minutes as needed.   0  . Omega-3 Fatty Acids (FISH OIL) 1000 MG CAPS Take 2,000 mg by mouth 2 (two) times daily.    . ondansetron (ZOFRAN ODT) 4 MG disintegrating tablet 60m ODT q4 hours prn  nausea/vomit 15 tablet 0  . predniSONE (DELTASONE) 5 MG tablet Take 7.5 mg by mouth daily.     . traMADol (ULTRAM) 50 MG tablet Take 25 mg by mouth every 4 (four) hours as needed for pain.     Marland Kitchen amoxicillin (AMOXIL) 500 MG tablet Take 1 tablet (500 mg total) by mouth 2 (two) times daily. 14 tablet 0   No current facility-administered medications for this visit.    PHYSICAL  EXAMINATION: ECOG PERFORMANCE STATUS: 2 - Symptomatic, <50% confined to bed  Filed Vitals:   06/05/15 0828  BP: 105/49  Pulse: 58  Temp: 98.1 F (36.7 C)  Resp: 20   There were no vitals filed for this visit.  GENERAL:alert, no distress and comfortable SKIN: skin color, texture, turgor are normal, no rashes or significant lesions. Noted skin bruising EYES: normal, Conjunctiva are pink and non-injected, sclera clear OROPHARYNX:no exudate, no erythema and lips, buccal mucosa, and tongue normal  NECK: supple, thyroid normal size, non-tender, without nodularity LYMPH:  no palpable lymphadenopathy in the cervical, axillary or inguinal LUNGS: clear to auscultation and percussion with normal breathing effort HEART: regular rate & rhythm and no murmurs and no lower extremity edema ABDOMEN:abdomen soft, non-tender and normal bowel sounds. Palpable splenomegaly Musculoskeletal:no cyanosis of digits and no clubbing  NEURO: alert & oriented x 3 with fluent speech, no focal motor/sensory deficits  LABORATORY DATA:  I have reviewed the data as listed    Component Value Date/Time   NA 139 06/03/2015 0936   NA 138 05/30/2014 1138   K 4.4 06/03/2015 0936   K 3.7 05/30/2014 1138   CL 104 06/03/2015 0936   CL 102 04/09/2013 1347   CO2 26 06/03/2015 0936   CO2 24 05/30/2014 1138   GLUCOSE 97 06/03/2015 0936   GLUCOSE 147* 05/30/2014 1138   GLUCOSE 113* 04/09/2013 1347   BUN 33* 06/03/2015 0936   BUN 26.2* 05/30/2014 1138   CREATININE 1.03 06/03/2015 0936   CREATININE 0.8 05/30/2014 1138   CALCIUM 9.2 06/03/2015 0936   CALCIUM 9.8 05/30/2014 1138   PROT 7.3 06/03/2015 0936   PROT 7.4 11/22/2013 0847   ALBUMIN 3.6 06/03/2015 0936   ALBUMIN 4.1 11/22/2013 0847   AST 22 06/03/2015 0936   AST 15 11/22/2013 0847   ALT 18 06/03/2015 0936   ALT 15 11/22/2013 0847   ALKPHOS 65 06/03/2015 0936   ALKPHOS 48 11/22/2013 0847   BILITOT 1.2 06/03/2015 0936   BILITOT 1.15 11/22/2013 0847    GFRNONAA >60 06/03/2015 0936   GFRAA >60 06/03/2015 0936    No results found for: SPEP, UPEP  Lab Results  Component Value Date   WBC 5.7 06/05/2015   NEUTROABS 3.6 06/05/2015   HGB 7.1* 06/05/2015   HCT 22.2* 06/05/2015   MCV 75.8* 06/05/2015   PLT 35* 06/05/2015      Chemistry      Component Value Date/Time   NA 139 06/03/2015 0936   NA 138 05/30/2014 1138   K 4.4 06/03/2015 0936   K 3.7 05/30/2014 1138   CL 104 06/03/2015 0936   CL 102 04/09/2013 1347   CO2 26 06/03/2015 0936   CO2 24 05/30/2014 1138   BUN 33* 06/03/2015 0936   BUN 26.2* 05/30/2014 1138   CREATININE 1.03 06/03/2015 0936   CREATININE 0.8 05/30/2014 1138      Component Value Date/Time   CALCIUM 9.2 06/03/2015 0936   CALCIUM 9.8 05/30/2014 1138   ALKPHOS 65 06/03/2015 0936  ALKPHOS 48 11/22/2013 0847   AST 22 06/03/2015 0936   AST 15 11/22/2013 0847   ALT 18 06/03/2015 0936   ALT 15 11/22/2013 0847   BILITOT 1.2 06/03/2015 0936   BILITOT 1.15 11/22/2013 0847       RADIOGRAPHIC STUDIES:I reviewed his CT scan and outside records I have personally reviewed the radiological images as listed and agreed with the findings in the report.    ASSESSMENT & PLAN:  MDS (myelodysplastic syndrome), 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 He had repeat bone marrow biopsy at [facility] His discussion at [facility] evolved around possibility of Kil[place] treatment. I suggested he inquired about possible clinical trial. I recommend port placement before chemotherapy.  Anemia in neoplastic disease We discussed some of the risks, benefits, and alternatives of blood transfusions. The patient is symptomatic from anemia and the hemoglobin level is critically low.  Some of the side-effects to be expected including risks of transfusion reactions, chills, infection, syndrome of volume overload and risk of hospitalization from various reasons and the patient is willing to proceed and went ahead to sign  consent today. He will continue close blood work monitoring with transfusion support to keep hemoglobin > 7.5 until definitive plan of treatment is available   No orders of the defined types were placed in this encounter.   All questions were answered. The patient knows to call the clinic with any problems, questions or concerns. No barriers to learning was detected. I spent 30 minutes counseling the patient face to face. The total time spent in the appointment was 40 minutes and more than 50% was on counseling and review of test results     [facility], Mcadoo Muzquiz, MD 06/05/2015 10:31 PM

## 2015-06-05 NOTE — Assessment & Plan Note (Signed)
We discussed some of the risks, benefits, and alternatives of blood transfusions. The patient is symptomatic from anemia and the hemoglobin level is critically low.  Some of the side-effects to be expected including risks of transfusion reactions, chills, infection, syndrome of volume overload and risk of hospitalization from various reasons and the patient is willing to proceed and went ahead to sign consent today. He will continue close blood work monitoring with transfusion support to keep hemoglobin > 7.5 until definitive plan of treatment is available

## 2015-06-05 NOTE — Patient Instructions (Signed)

## 2015-06-05 NOTE — Assessment & Plan Note (Signed)
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 He had repeat bone marrow biopsy at [facility] His discussion at [facility] evolved around possibility of [place] treatment. I suggested he inquired about possible clinical trial. I recommend port placement before chemotherapy.

## 2015-06-06 ENCOUNTER — Encounter (HOSPITAL_COMMUNITY): Payer: Medicare Other

## 2015-06-06 ENCOUNTER — Ambulatory Visit (HOSPITAL_COMMUNITY): Payer: Medicare Other

## 2015-06-06 LAB — TYPE AND SCREEN
ABO/RH(D): O POS
Antibody Screen: NEGATIVE
Unit division: 0
Unit division: 0

## 2015-06-06 NOTE — Progress Notes (Signed)
The patient also complaining of low-grade fever. I gave him prescription anti-biotics to hang onto and he has my permission to start the antibiotics if his fever recurred greater than 101.

## 2015-06-11 ENCOUNTER — Ambulatory Visit (HOSPITAL_COMMUNITY): Payer: Medicare Other

## 2015-06-11 ENCOUNTER — Encounter (HOSPITAL_COMMUNITY)
Admission: RE | Admit: 2015-06-11 | Discharge: 2015-06-11 | Disposition: A | Discharge status: Home / Self Care | Payer: Medicare Other | Source: Ambulatory Visit | Attending: Interventional Cardiology | Admitting: Interventional Cardiology

## 2015-06-11 DIAGNOSIS — Z951 Presence of aortocoronary bypass graft: Secondary | ICD-10-CM | POA: Diagnosis present

## 2015-06-12 ENCOUNTER — Ambulatory Visit: Payer: Medicare Other

## 2015-06-12 ENCOUNTER — Other Ambulatory Visit (HOSPITAL_BASED_OUTPATIENT_CLINIC_OR_DEPARTMENT_OTHER): Payer: Medicare Other

## 2015-06-12 DIAGNOSIS — D696 Thrombocytopenia, unspecified: Secondary | ICD-10-CM

## 2015-06-12 DIAGNOSIS — D4620 Refractory anemia with excess of blasts, unspecified: Secondary | ICD-10-CM | POA: Diagnosis present

## 2015-06-12 LAB — CBC WITH DIFFERENTIAL/PLATELET
BASO%: 1.9 % (ref 0.0–2.0)
Basophils Absolute: 0.2 10*3/uL — ABNORMAL HIGH (ref 0.0–0.1)
EOS ABS: 0 10*3/uL (ref 0.0–0.5)
EOS%: 0 % (ref 0.0–7.0)
HEMATOCRIT: 29.6 % — AB (ref 38.4–49.9)
HGB: 9.6 g/dL — ABNORMAL LOW (ref 13.0–17.1)
LYMPH#: 1.9 10*3/uL (ref 0.9–3.3)
LYMPH%: 22.8 % (ref 14.0–49.0)
MCH: 25.3 pg — ABNORMAL LOW (ref 27.2–33.4)
MCHC: 32.4 g/dL (ref 32.0–36.0)
MCV: 78.1 fL — ABNORMAL LOW (ref 79.3–98.0)
MONO#: 1.1 10*3/uL — ABNORMAL HIGH (ref 0.1–0.9)
MONO%: 13.4 % (ref 0.0–14.0)
NEUT#: 5.2 10*3/uL (ref 1.5–6.5)
NEUT%: 61.9 % (ref 39.0–75.0)
Platelets: 35 10*3/uL — ABNORMAL LOW (ref 140–400)
RBC: 3.79 10*6/uL — ABNORMAL LOW (ref 4.20–5.82)
RDW: 22.1 % — AB (ref 11.0–14.6)
WBC: 8.3 10*3/uL (ref 4.0–10.3)
nRBC: 5 % — ABNORMAL HIGH (ref 0–0)

## 2015-06-12 LAB — TECHNOLOGIST REVIEW

## 2015-06-13 ENCOUNTER — Encounter (HOSPITAL_COMMUNITY)
Admission: RE | Admit: 2015-06-13 | Discharge: 2015-06-13 | Disposition: A | Discharge status: Home / Self Care | Payer: Medicare Other | Source: Ambulatory Visit | Attending: Interventional Cardiology | Admitting: Interventional Cardiology

## 2015-06-13 ENCOUNTER — Ambulatory Visit (HOSPITAL_COMMUNITY): Payer: Medicare Other

## 2015-06-13 DIAGNOSIS — Z951 Presence of aortocoronary bypass graft: Secondary | ICD-10-CM | POA: Diagnosis not present

## 2015-06-16 ENCOUNTER — Encounter (HOSPITAL_COMMUNITY)
Admission: RE | Admit: 2015-06-16 | Discharge: 2015-06-16 | Disposition: A | Discharge status: Home / Self Care | Payer: Medicare Other | Source: Ambulatory Visit | Attending: Interventional Cardiology | Admitting: Interventional Cardiology

## 2015-06-16 DIAGNOSIS — Z951 Presence of aortocoronary bypass graft: Secondary | ICD-10-CM | POA: Diagnosis not present

## 2015-06-18 ENCOUNTER — Encounter (HOSPITAL_COMMUNITY)
Admission: RE | Admit: 2015-06-18 | Discharge: 2015-06-18 | Disposition: A | Discharge status: Home / Self Care | Payer: Medicare Other | Source: Ambulatory Visit | Attending: Interventional Cardiology | Admitting: Interventional Cardiology

## 2015-06-18 DIAGNOSIS — Z951 Presence of aortocoronary bypass graft: Secondary | ICD-10-CM | POA: Diagnosis not present

## 2015-06-18 NOTE — Progress Notes (Signed)
Pt will graduate from cardiac rehab program on 06/20/15  with completion of 36 exercise sessions in Phase II. Pt maintained good attendance to  both exercise and education classes.  Pt was commended on his attendance and pt remarked," well when I am suppose to do something, I make sure I do it." Pt progressed nicely during his participation in rehab as evidenced by increased MET level. Pt increased from 2.7 to 3.5 over 12 week period.  Medication list reconciled. Pt verbalizes continued compliance to medication regimen. Repeat  PHQ score-0 .  Pt with some health anxiety regarding upcoming results of testing he had completed at [facility] due to enlarged spleen. One possible treatment is for chemo treatments. Pt is unsure if he will go that course or not.  Pt completed post exercise QOL survey which showed a decrease in the areas of overall at 21.64 from 22.31, Health and functioning 19.20 from 21.00 and slight decrease Psychological and Spiritual 23.14 from 24.00.  Pt attributes this to the uncertainty of his health moving forward due to not knowing which direction will need to be taken. Pt relies on his faith in God to sustain and keep him. Pt has made significant lifestyle changes and should be commended for his success.  Pt lost 2.5 kg during his participation in rehab.  Pt feels he has achieved his goals to get stronger during cardiac rehab.   Pt plans to continue exercise at the [facility] on [address].  Pt plans to average three times a week during the summer months. Pt is active in his yard during the summer.  Pt has 4 acres that he mows and cares for.  Pt  plans to increase to 4-5 x week during the winter months. Based upon his excellent attendance and rate of progress made I believe pt will be successful in continuing exercise.  It was indeed a great pleasure to work with this pt in the cardiac rehab program! Maurice Small RN, BSN

## 2015-06-19 ENCOUNTER — Telehealth: Payer: Self-pay | Admitting: Hematology and Oncology

## 2015-06-19 ENCOUNTER — Ambulatory Visit (HOSPITAL_BASED_OUTPATIENT_CLINIC_OR_DEPARTMENT_OTHER): Payer: Medicare Other

## 2015-06-19 ENCOUNTER — Other Ambulatory Visit: Payer: Self-pay | Admitting: Hematology and Oncology

## 2015-06-19 ENCOUNTER — Telehealth: Payer: Self-pay | Admitting: *Deleted

## 2015-06-19 VITALS — BP 107/31 | HR 61 | Temp 98.3°F

## 2015-06-19 DIAGNOSIS — D630 Anemia in neoplastic disease: Secondary | ICD-10-CM

## 2015-06-19 DIAGNOSIS — D4620 Refractory anemia with excess of blasts, unspecified: Secondary | ICD-10-CM

## 2015-06-19 DIAGNOSIS — D696 Thrombocytopenia, unspecified: Secondary | ICD-10-CM

## 2015-06-19 LAB — CBC WITH DIFFERENTIAL/PLATELET
BASO%: 0.7 % (ref 0.0–2.0)
Basophils Absolute: 0.1 10*3/uL (ref 0.0–0.1)
EOS%: 0 % (ref 0.0–7.0)
Eosinophils Absolute: 0 10*3/uL (ref 0.0–0.5)
HCT: 24.9 % — ABNORMAL LOW (ref 38.4–49.9)
HGB: 8 g/dL — ABNORMAL LOW (ref 13.0–17.1)
LYMPH#: 1 10*3/uL (ref 0.9–3.3)
LYMPH%: 13.7 % — ABNORMAL LOW (ref 14.0–49.0)
MCH: 24.5 pg — AB (ref 27.2–33.4)
MCHC: 32.1 g/dL (ref 32.0–36.0)
MCV: 76.4 fL — ABNORMAL LOW (ref 79.3–98.0)
MONO#: 1.4 10*3/uL — ABNORMAL HIGH (ref 0.1–0.9)
MONO%: 19 % — ABNORMAL HIGH (ref 0.0–14.0)
NEUT%: 66.6 % (ref 39.0–75.0)
NEUTROS ABS: 4.9 10*3/uL (ref 1.5–6.5)
PLATELETS: 40 10*3/uL — AB (ref 140–400)
RBC: 3.26 10*6/uL — ABNORMAL LOW (ref 4.20–5.82)
RDW: 22.1 % — ABNORMAL HIGH (ref 11.0–14.6)
WBC: 7.3 10*3/uL (ref 4.0–10.3)
nRBC: 1 % — ABNORMAL HIGH (ref 0–0)

## 2015-06-19 LAB — TECHNOLOGIST REVIEW

## 2015-06-19 LAB — HOLD TUBE, BLOOD BANK

## 2015-06-19 MED ORDER — DARBEPOETIN ALFA 500 MCG/ML IJ SOSY
500.0000 ug | PREFILLED_SYRINGE | Freq: Once | INTRAMUSCULAR | Status: AC
  Administered 2015-06-19: 500 ug via SUBCUTANEOUS
  Filled 2015-06-19: qty 1

## 2015-06-19 NOTE — Telephone Encounter (Signed)
lvm fo rpt regarding added appt....left msg for Thayer Headings to print pt sched today at visit

## 2015-06-19 NOTE — Telephone Encounter (Signed)
Pt states he is due for Aranesp today but it is not on his schedule.  Should we add him for lab/aranesp injection today?

## 2015-06-19 NOTE — Telephone Encounter (Signed)
LVM for pt about appts today, then called cell phone and s/w wife.  She says pt here now in lobby.  Instructed for him to go ahead and check in and they will get him as soon as they can.   She says they have not heard back about return appt at [facility] yet.  Asked her to please let us know what decision is made about treatment at [place] Dr. Alvy Bimler will continue Aranesp every two weeks.  Wife verbalized understanding.

## 2015-06-20 ENCOUNTER — Encounter (HOSPITAL_COMMUNITY)
Admission: RE | Admit: 2015-06-20 | Discharge: 2015-06-20 | Disposition: A | Discharge status: Home / Self Care | Payer: Medicare Other | Source: Ambulatory Visit | Attending: Interventional Cardiology | Admitting: Interventional Cardiology

## 2015-06-20 DIAGNOSIS — Z951 Presence of aortocoronary bypass graft: Secondary | ICD-10-CM | POA: Diagnosis not present

## 2015-06-23 ENCOUNTER — Encounter (HOSPITAL_COMMUNITY): Payer: Medicare Other

## 2015-06-24 ENCOUNTER — Other Ambulatory Visit (HOSPITAL_BASED_OUTPATIENT_CLINIC_OR_DEPARTMENT_OTHER): Payer: Medicare Other

## 2015-06-24 ENCOUNTER — Encounter: Payer: Self-pay | Admitting: Hematology and Oncology

## 2015-06-24 ENCOUNTER — Ambulatory Visit (HOSPITAL_BASED_OUTPATIENT_CLINIC_OR_DEPARTMENT_OTHER): Payer: Medicare Other | Admitting: Hematology and Oncology

## 2015-06-24 ENCOUNTER — Encounter: Payer: Self-pay | Admitting: *Deleted

## 2015-06-24 ENCOUNTER — Telehealth: Payer: Self-pay | Admitting: *Deleted

## 2015-06-24 ENCOUNTER — Ambulatory Visit (HOSPITAL_BASED_OUTPATIENT_CLINIC_OR_DEPARTMENT_OTHER): Payer: Medicare Other

## 2015-06-24 ENCOUNTER — Ambulatory Visit (HOSPITAL_COMMUNITY)
Admission: RE | Admit: 2015-06-24 | Discharge: 2015-06-24 | Disposition: A | Discharge status: Home / Self Care | Payer: Medicare Other | Source: Ambulatory Visit | Attending: Hematology and Oncology | Admitting: Hematology and Oncology

## 2015-06-24 ENCOUNTER — Other Ambulatory Visit: Payer: Self-pay | Admitting: Hematology and Oncology

## 2015-06-24 VITALS — BP 110/55 | HR 95 | Temp 98.4°F | Resp 18

## 2015-06-24 VITALS — BP 108/36 | HR 82 | Temp 98.0°F | Resp 18 | Ht 68.0 in | Wt 179.6 lb

## 2015-06-24 DIAGNOSIS — D469 Myelodysplastic syndrome, unspecified: Secondary | ICD-10-CM

## 2015-06-24 DIAGNOSIS — D630 Anemia in neoplastic disease: Secondary | ICD-10-CM

## 2015-06-24 DIAGNOSIS — D4620 Refractory anemia with excess of blasts, unspecified: Secondary | ICD-10-CM

## 2015-06-24 DIAGNOSIS — D696 Thrombocytopenia, unspecified: Secondary | ICD-10-CM | POA: Diagnosis not present

## 2015-06-24 DIAGNOSIS — R6883 Chills (without fever): Secondary | ICD-10-CM | POA: Diagnosis not present

## 2015-06-24 DIAGNOSIS — D46Z Other myelodysplastic syndromes: Secondary | ICD-10-CM

## 2015-06-24 DIAGNOSIS — I2510 Atherosclerotic heart disease of native coronary artery without angina pectoris: Secondary | ICD-10-CM | POA: Diagnosis not present

## 2015-06-24 LAB — CBC WITH DIFFERENTIAL/PLATELET
BASO%: 0.7 % (ref 0.0–2.0)
Basophils Absolute: 0 10*3/uL (ref 0.0–0.1)
EOS ABS: 0 10*3/uL (ref 0.0–0.5)
EOS%: 0 % (ref 0.0–7.0)
HEMATOCRIT: 20.2 % — AB (ref 38.4–49.9)
HGB: 6.4 g/dL — CL (ref 13.0–17.1)
LYMPH#: 1.2 10*3/uL (ref 0.9–3.3)
LYMPH%: 19.7 % (ref 14.0–49.0)
MCH: 24.3 pg — AB (ref 27.2–33.4)
MCHC: 31.7 g/dL — AB (ref 32.0–36.0)
MCV: 76.8 fL — ABNORMAL LOW (ref 79.3–98.0)
MONO#: 0.8 10*3/uL (ref 0.1–0.9)
MONO%: 12.5 % (ref 0.0–14.0)
NEUT#: 4 10*3/uL (ref 1.5–6.5)
NEUT%: 67.1 % (ref 39.0–75.0)
NRBC: 3 % — AB (ref 0–0)
PLATELETS: 30 10*3/uL — AB (ref 140–400)
RBC: 2.63 10*6/uL — AB (ref 4.20–5.82)
RDW: 22 % — ABNORMAL HIGH (ref 11.0–14.6)
WBC: 6 10*3/uL (ref 4.0–10.3)

## 2015-06-24 LAB — TECHNOLOGIST REVIEW

## 2015-06-24 LAB — HOLD TUBE, BLOOD BANK

## 2015-06-24 MED ORDER — AMOXICILLIN-POT CLAVULANATE 875-125 MG PO TABS: 1.0000 | ORAL_TABLET | Freq: Two times a day (BID) | ORAL | Status: DC

## 2015-06-24 MED ORDER — DIPHENHYDRAMINE HCL 25 MG PO CAPS
ORAL_CAPSULE | ORAL | Status: AC
  Filled 2015-06-24: qty 1

## 2015-06-24 MED ORDER — DIPHENHYDRAMINE HCL 25 MG PO CAPS
25.0000 mg | ORAL_CAPSULE | Freq: Once | ORAL | Status: AC
  Administered 2015-06-24: 25 mg via ORAL

## 2015-06-24 MED ORDER — ACETAMINOPHEN 325 MG PO TABS
650.0000 mg | ORAL_TABLET | Freq: Once | ORAL | Status: AC
  Administered 2015-06-24: 650 mg via ORAL

## 2015-06-24 MED ORDER — SODIUM CHLORIDE 0.9 % IV SOLN: 250.0000 mL | Freq: Once | INTRAVENOUS | Status: DC

## 2015-06-24 MED ORDER — ACETAMINOPHEN 325 MG PO TABS
ORAL_TABLET | ORAL | Status: AC
  Filled 2015-06-24: qty 2

## 2015-06-24 NOTE — Assessment & Plan Note (Signed)
We discussed some of the risks, benefits, and alternatives of blood transfusions. The patient is symptomatic from anemia and the hemoglobin level is critically low.  Some of the side-effects to be expected including risks of transfusion reactions, chills, infection, syndrome of volume overload and risk of hospitalization from various reasons and the patient is willing to proceed and went ahead to sign consent today. He will continue close blood work monitoring with transfusion support to keep hemoglobin > 7.5 until definitive plan of treatment is available

## 2015-06-24 NOTE — Assessment & Plan Note (Signed)
He has significant drop off his platelet count. I suspect it could be a progression of his bone marrow disease. In the meantime, I told him to hold off taking aspirin therapy until his platelet count rebound to greater than 50,000. About from bruising, he is not symptomatic with bleeding. He does not require platelet transfusion today.

## 2015-06-24 NOTE — Assessment & Plan Note (Signed)
Clinically, he does not appear septic. He does not need anti-biotics. I recommend close observation only for now.

## 2015-06-24 NOTE — Progress Notes (Signed)
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 He returns for further follow-up. He is seen urgently today because of low-grade dose with fever 99. He denies localizing signs up from mild nasal drainage. Denies any sore throat, cough, diarrhea, dysuria or frequency or urgency. He complained of profound fatigue and weakness.  REVIEW OF SYSTEMS:   Eyes: Denies blurriness of vision Ears, nose, mouth, throat, and face: Denies mucositis or sore throat Respiratory: Denies cough, dyspnea or wheezes Cardiovascular: Denies palpitation, chest discomfort or lower extremity swelling Gastrointestinal:  Denies nausea, heartburn or change in bowel habits Skin: Denies abnormal skin rashes Lymphatics: Denies new lymphadenopathy or easy bruising Neurological:Denies numbness, tingling Behavioral/Psych: Mood is stable, no new changes  All other systems were reviewed with the patient and are negative.  I have reviewed the past medical history, past surgical history, social history and family history with the patient and they are unchanged from previous note.  ALLERGIES:  has No Known Allergies.  MEDICATIONS:  Current Outpatient  Prescriptions  Medication Sig Dispense Refill  . acetaminophen (TYLENOL) 500 MG tablet Take 1,000 mg by mouth every 6 (six) hours as needed for pain.    Marland Kitchen alendronate (FOSAMAX) 70 MG tablet Take 70 mg by mouth once a week. On Friday. Take with a full glass of water on an empty stomach.    Marland Kitchen allopurinol (ZYLOPRIM) 100 MG tablet Take 100 mg by mouth daily.    Marland Kitchen ALPRAZolam (XANAX) 0.25 MG tablet Take 0.125 mg by mouth 2 (two) times daily as needed for anxiety.   0  . amoxicillin (AMOXIL) 500 MG tablet Take 1 tablet (500 mg total) by mouth 2 (two) times daily. 14 tablet 0  . amoxicillin-clavulanate (AUGMENTIN) 875-125 MG per tablet Take 1 tablet by mouth 2 (two) times daily. 14 tablet 0  . atorvastatin (LIPITOR) 20 MG tablet Take 1 tablet (20 mg total) by mouth daily. 90 tablet 3  . Calcium-Vitamin D (CALTRATE 600 PLUS-VIT D PO) Take 1 tablet by mouth 2 (two) times daily.     . Darbepoetin Alfa (ARANESP) 500 MCG/ML SOSY injection Inject 500 mcg into the skin every 14 (fourteen) days.     Marland Kitchen lisinopril-hydrochlorothiazide (PRINZIDE,ZESTORETIC) 20-25 MG per tablet Take 0.5 tablets by mouth daily.   1  . loratadine (CLARITIN) 10 MG tablet Take 10 mg by mouth daily.    . metoprolol (LOPRESSOR) 50 MG tablet take 1/2 tablet by mouth twice a day 30 tablet 6  . MULTIPLE VITAMINS-MINERALS PO Take 1 tablet by mouth 2 (two) times daily.    Marland Kitchen NITROSTAT 0.4 MG SL tablet Place 0.4 mg under the tongue every 5 (five) minutes as needed.   0  . Omega-3 Fatty Acids (FISH OIL) 1000 MG CAPS Take 2,000 mg by mouth 2 (two) times  daily.    . ondansetron (ZOFRAN ODT) 4 MG disintegrating tablet 4mg  ODT q4 hours prn nausea/vomit 15 tablet 0  . predniSONE (DELTASONE) 5 MG tablet Take 7.5 mg by mouth daily.     . traMADol (ULTRAM) 50 MG tablet Take 25 mg by mouth every 4 (four) hours as needed for pain.      No current facility-administered medications for this visit.   Facility-Administered Medications Ordered in Other Visits   Medication Dose Route Frequency Provider Last Rate Last Dose  . 0.9 %  sodium chloride infusion  250 mL Intravenous Once Heath Lark, MD      . acetaminophen (TYLENOL) tablet 650 mg  650 mg Oral Once Heath Lark, MD      . diphenhydrAMINE (BENADRYL) capsule 25 mg  25 mg Oral Once Heath Lark, MD        PHYSICAL EXAMINATION: ECOG PERFORMANCE STATUS: 2 - Symptomatic, <50% confined to bed  Filed Vitals:   06/24/15 1207  BP: 108/36  Pulse: 82  Temp: 98 F (36.7 C)  Resp: 18   Filed Weights   06/24/15 1207  Weight: 179 lb 9.6 oz (81.466 kg)    GENERAL:alert, no distress and comfortable. He looks pale SKIN: skin color, texture, turgor are normal, no rashes or significant lesions EYES: normal, Conjunctiva are pale and non-injected, sclera clear OROPHARYNX:no exudate, no erythema and lips, buccal mucosa, and tongue normal  NECK: supple, thyroid normal size, non-tender, without nodularity LYMPH:  no palpable lymphadenopathy in the cervical, axillary or inguinal LUNGS: clear to auscultation and percussion with normal breathing effort HEART: regular rate & rhythm and no murmurs and no lower extremity edema ABDOMEN:abdomen soft, non-tender and normal bowel sounds Musculoskeletal:no cyanosis of digits and no clubbing  NEURO: alert & oriented x 3 with fluent speech, no focal motor/sensory deficits  LABORATORY DATA:  I have reviewed the data as listed    Component Value Date/Time   NA 139 06/03/2015 0936   NA 138 05/30/2014 1138   K 4.4 06/03/2015 0936   K 3.7 05/30/2014 1138   CL 104 06/03/2015 0936   CL 102 04/09/2013 1347   CO2 26 06/03/2015 0936   CO2 24 05/30/2014 1138   GLUCOSE 97 06/03/2015 0936   GLUCOSE 147* 05/30/2014 1138   GLUCOSE 113* 04/09/2013 1347   BUN 33* 06/03/2015 0936   BUN 26.2* 05/30/2014 1138   CREATININE 1.03 06/03/2015 0936   CREATININE 0.8 05/30/2014 1138   CALCIUM 9.2 06/03/2015 0936   CALCIUM 9.8 05/30/2014 1138   PROT 7.3 06/03/2015 0936   PROT  7.4 11/22/2013 0847   ALBUMIN 3.6 06/03/2015 0936   ALBUMIN 4.1 11/22/2013 0847   AST 22 06/03/2015 0936   AST 15 11/22/2013 0847   ALT 18 06/03/2015 0936   ALT 15 11/22/2013 0847   ALKPHOS 65 06/03/2015 0936   ALKPHOS 48 11/22/2013 0847   BILITOT 1.2 06/03/2015 0936   BILITOT 1.15 11/22/2013 0847   GFRNONAA >60 06/03/2015 0936   GFRAA >60 06/03/2015 0936    No results found for: SPEP, UPEP  Lab Results  Component Value Date   WBC 6.0 06/24/2015   NEUTROABS 4.0 06/24/2015   HGB 6.4* 06/24/2015   HCT 20.2* 06/24/2015   MCV 76.8* 06/24/2015   PLT 30* 06/24/2015      Chemistry      Component Value Date/Time   NA 139 06/03/2015 0936   NA 138 05/30/2014 1138   K 4.4 06/03/2015 0936   K 3.7 05/30/2014  1138   CL 104 06/03/2015 0936   CL 102 04/09/2013 1347   CO2 26 06/03/2015 0936   CO2 24 05/30/2014 1138   BUN 33* 06/03/2015 0936   BUN 26.2* 05/30/2014 1138   CREATININE 1.03 06/03/2015 0936   CREATININE 0.8 05/30/2014 1138      Component Value Date/Time   CALCIUM 9.2 06/03/2015 0936   CALCIUM 9.8 05/30/2014 1138   ALKPHOS 65 06/03/2015 0936   ALKPHOS 48 11/22/2013 0847   AST 22 06/03/2015 0936   AST 15 11/22/2013 0847   ALT 18 06/03/2015 0936   ALT 15 11/22/2013 0847   BILITOT 1.2 06/03/2015 0936   BILITOT 1.15 11/22/2013 0847      ASSESSMENT & PLAN:  MDS (myelodysplastic syndrome), 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 He had repeat bone marrow biopsy at [facility] His discussion at [facility] evolved around possibility of [place] treatment. 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 Clinical trial is a possibility. I'm waiting to hear back from his hematologist. If not, he was recommended to try danazol. I will discuss with the patient further next week once I hear back from [facility]. In the meantime, he will continue transfusion support with Aranesp  Anemia in neoplastic disease We discussed some of the risks,  benefits, and alternatives of blood transfusions. The patient is symptomatic from anemia and the hemoglobin level is critically low.  Some of the side-effects to be expected including risks of transfusion reactions, chills, infection, syndrome of volume overload and risk of hospitalization from various reasons and the patient is willing to proceed and went ahead to sign consent today. He will continue close blood work monitoring with transfusion support to keep hemoglobin > 7.5 until definitive plan of treatment is available  Thrombocytopenia He has significant drop off his platelet count. I suspect it could be a progression of his bone marrow disease. In the meantime, I told him to hold off taking aspirin therapy until his platelet count rebound to greater than 50,000. About from bruising, he is not symptomatic with bleeding. He does not require platelet transfusion today.    Chills (without fever) Clinically, he does not appear septic. He does not need anti-biotics. I recommend close observation only for now.   No orders of the defined types were placed in this encounter.   All questions were answered. The patient knows to call the clinic with any problems, questions or concerns. No barriers to learning was detected. I spent 25 minutes counseling the patient face to face. The total time spent in the appointment was 40 minutes and more than 50% was on counseling and review of test results     [facility], [place], MD 06/24/2015 1:14 PM

## 2015-06-24 NOTE — Telephone Encounter (Signed)
Can he come in today? He needs labs I can see him at 12 pm

## 2015-06-24 NOTE — Patient Instructions (Signed)

## 2015-06-24 NOTE — Assessment & Plan Note (Signed)
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 He had repeat bone marrow biopsy at [facility] His discussion at [facility] evolved around possibility of [place] treatment. 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 Clinical trial is a possibility. I'm waiting to hear back from his hematologist. If not, he was recommended to try danazol. I will discuss with the patient further next week once I hear back from [facility]. In the meantime, he will continue transfusion support with Aranesp

## 2015-06-24 NOTE — Telephone Encounter (Signed)
TC from patient this am stating he had a fever last night of 99.8. No other symptoms to report other than feeling weak. Denies fever this am. He wanted Dr. Alvy Bimler to know.  Next appt is 07/03/15

## 2015-06-24 NOTE — Telephone Encounter (Signed)
Pt will come today at 11:30 for lab/12:00 MD

## 2015-06-25 ENCOUNTER — Encounter (HOSPITAL_COMMUNITY): Payer: Medicare Other

## 2015-06-25 LAB — TYPE AND SCREEN
ABO/RH(D): O POS
Antibody Screen: NEGATIVE
UNIT DIVISION: 0
UNIT DIVISION: 0

## 2015-06-27 ENCOUNTER — Encounter (HOSPITAL_COMMUNITY): Payer: Medicare Other

## 2015-06-30 ENCOUNTER — Encounter (HOSPITAL_COMMUNITY): Payer: Medicare Other

## 2015-07-02 ENCOUNTER — Encounter (HOSPITAL_COMMUNITY): Payer: Medicare Other

## 2015-07-03 ENCOUNTER — Other Ambulatory Visit: Payer: Medicare Other

## 2015-07-03 ENCOUNTER — Other Ambulatory Visit (HOSPITAL_BASED_OUTPATIENT_CLINIC_OR_DEPARTMENT_OTHER): Payer: Medicare Other

## 2015-07-03 ENCOUNTER — Ambulatory Visit (HOSPITAL_BASED_OUTPATIENT_CLINIC_OR_DEPARTMENT_OTHER): Payer: Medicare Other

## 2015-07-03 ENCOUNTER — Ambulatory Visit: Payer: Medicare Other

## 2015-07-03 VITALS — BP 132/49 | HR 83 | Temp 99.0°F

## 2015-07-03 DIAGNOSIS — D4620 Refractory anemia with excess of blasts, unspecified: Secondary | ICD-10-CM | POA: Diagnosis present

## 2015-07-03 DIAGNOSIS — D630 Anemia in neoplastic disease: Secondary | ICD-10-CM

## 2015-07-03 DIAGNOSIS — D696 Thrombocytopenia, unspecified: Secondary | ICD-10-CM

## 2015-07-03 DIAGNOSIS — D46Z Other myelodysplastic syndromes: Secondary | ICD-10-CM | POA: Diagnosis present

## 2015-07-03 LAB — CBC WITH DIFFERENTIAL/PLATELET
BASO%: 1 % (ref 0.0–2.0)
BASOS ABS: 0.1 10*3/uL (ref 0.0–0.1)
EOS%: 0 % (ref 0.0–7.0)
Eosinophils Absolute: 0 10*3/uL (ref 0.0–0.5)
HCT: 26.2 % — ABNORMAL LOW (ref 38.4–49.9)
HGB: 8.4 g/dL — ABNORMAL LOW (ref 13.0–17.1)
LYMPH%: 22.7 % (ref 14.0–49.0)
MCH: 25.5 pg — ABNORMAL LOW (ref 27.2–33.4)
MCHC: 32.1 g/dL (ref 32.0–36.0)
MCV: 79.6 fL (ref 79.3–98.0)
MONO#: 0.6 10*3/uL (ref 0.1–0.9)
MONO%: 9.2 % (ref 0.0–14.0)
NEUT#: 4.2 10*3/uL (ref 1.5–6.5)
NEUT%: 67.1 % (ref 39.0–75.0)
PLATELETS: 38 10*3/uL — AB (ref 140–400)
RBC: 3.29 10*6/uL — ABNORMAL LOW (ref 4.20–5.82)
RDW: 22.5 % — ABNORMAL HIGH (ref 11.0–14.6)
WBC: 6.3 10*3/uL (ref 4.0–10.3)
lymph#: 1.4 10*3/uL (ref 0.9–3.3)
nRBC: 2 % — ABNORMAL HIGH (ref 0–0)

## 2015-07-03 LAB — TECHNOLOGIST REVIEW

## 2015-07-03 LAB — HOLD TUBE, BLOOD BANK

## 2015-07-03 MED ORDER — DARBEPOETIN ALFA 500 MCG/ML IJ SOSY
500.0000 ug | PREFILLED_SYRINGE | Freq: Once | INTRAMUSCULAR | Status: AC
  Administered 2015-07-03: 500 ug via SUBCUTANEOUS
  Filled 2015-07-03: qty 1

## 2015-07-04 ENCOUNTER — Encounter (HOSPITAL_COMMUNITY): Payer: Medicare Other

## 2015-07-07 ENCOUNTER — Encounter (HOSPITAL_COMMUNITY): Payer: Medicare Other

## 2015-07-09 ENCOUNTER — Encounter (HOSPITAL_COMMUNITY): Payer: Medicare Other

## 2015-07-09 ENCOUNTER — Telehealth: Payer: Self-pay | Admitting: Hematology and Oncology

## 2015-07-09 ENCOUNTER — Telehealth: Payer: Self-pay | Admitting: *Deleted

## 2015-07-09 NOTE — Telephone Encounter (Signed)
Informed wife that Dr. Rudean Hitt had recommended to Dr. Alvy Bimler to stop Aranesp for now and get blood transfusion as needed every 2 to 3 weeks.  Planned to cancel Aranesp next week on 8/11 and order transfusion appt instead.  Wife says she thinks pt looks like he may need blood sooner than next week.  His blood pressure is low and he is more pale.  I will send POF to add pt for lab/transfusion this Friday 8/5,  It has been two weeks since his last transfusion.  Wife verbalized understanding and will expect call from [facility] with time.

## 2015-07-09 NOTE — Telephone Encounter (Signed)
s.w. pt wife adn advised on 8.5 appt...ok adn aware

## 2015-07-10 ENCOUNTER — Ambulatory Visit (HOSPITAL_COMMUNITY)
Admission: RE | Admit: 2015-07-10 | Discharge: 2015-07-10 | Disposition: A | Discharge status: Home / Self Care | Payer: Medicare Other | Source: Ambulatory Visit | Attending: Hematology and Oncology | Admitting: Hematology and Oncology

## 2015-07-10 DIAGNOSIS — D469 Myelodysplastic syndrome, unspecified: Secondary | ICD-10-CM

## 2015-07-11 ENCOUNTER — Other Ambulatory Visit: Payer: Self-pay | Admitting: Hematology and Oncology

## 2015-07-11 ENCOUNTER — Encounter (HOSPITAL_COMMUNITY): Payer: Medicare Other

## 2015-07-11 ENCOUNTER — Other Ambulatory Visit (HOSPITAL_BASED_OUTPATIENT_CLINIC_OR_DEPARTMENT_OTHER): Payer: Medicare Other

## 2015-07-11 ENCOUNTER — Telehealth: Payer: Self-pay | Admitting: Hematology and Oncology

## 2015-07-11 ENCOUNTER — Ambulatory Visit (HOSPITAL_BASED_OUTPATIENT_CLINIC_OR_DEPARTMENT_OTHER): Payer: Medicare Other

## 2015-07-11 VITALS — BP 111/51 | HR 66 | Temp 98.0°F | Resp 20

## 2015-07-11 DIAGNOSIS — D696 Thrombocytopenia, unspecified: Secondary | ICD-10-CM

## 2015-07-11 DIAGNOSIS — D46Z Other myelodysplastic syndromes: Secondary | ICD-10-CM | POA: Diagnosis present

## 2015-07-11 DIAGNOSIS — D4620 Refractory anemia with excess of blasts, unspecified: Secondary | ICD-10-CM

## 2015-07-11 DIAGNOSIS — D469 Myelodysplastic syndrome, unspecified: Secondary | ICD-10-CM | POA: Diagnosis present

## 2015-07-11 DIAGNOSIS — D630 Anemia in neoplastic disease: Secondary | ICD-10-CM

## 2015-07-11 LAB — PREPARE RBC (CROSSMATCH)

## 2015-07-11 LAB — CBC WITH DIFFERENTIAL/PLATELET
BASO%: 1.4 % (ref 0.0–2.0)
Basophils Absolute: 0.1 10*3/uL (ref 0.0–0.1)
EOS%: 0 % (ref 0.0–7.0)
Eosinophils Absolute: 0 10*3/uL (ref 0.0–0.5)
HEMATOCRIT: 20.3 % — AB (ref 38.4–49.9)
HGB: 6.5 g/dL — CL (ref 13.0–17.1)
LYMPH%: 23.1 % (ref 14.0–49.0)
MCH: 25.3 pg — ABNORMAL LOW (ref 27.2–33.4)
MCHC: 32 g/dL (ref 32.0–36.0)
MCV: 79 fL — ABNORMAL LOW (ref 79.3–98.0)
MONO#: 0.9 10*3/uL (ref 0.1–0.9)
MONO%: 14.3 % — AB (ref 0.0–14.0)
NEUT%: 61.2 % (ref 39.0–75.0)
NEUTROS ABS: 3.8 10*3/uL (ref 1.5–6.5)
NRBC: 5 % — AB (ref 0–0)
PLATELETS: 24 10*3/uL — AB (ref 140–400)
RBC: 2.57 10*6/uL — ABNORMAL LOW (ref 4.20–5.82)
RDW: 22.9 % — ABNORMAL HIGH (ref 11.0–14.6)
WBC: 6.3 10*3/uL (ref 4.0–10.3)
lymph#: 1.5 10*3/uL (ref 0.9–3.3)

## 2015-07-11 LAB — TECHNOLOGIST REVIEW

## 2015-07-11 LAB — HOLD TUBE, BLOOD BANK

## 2015-07-11 MED ORDER — SODIUM CHLORIDE 0.9 % IV SOLN
250.0000 mL | Freq: Once | INTRAVENOUS | Status: AC
  Administered 2015-07-11: 250 mL via INTRAVENOUS

## 2015-07-11 MED ORDER — ACETAMINOPHEN 325 MG PO TABS
ORAL_TABLET | ORAL | Status: AC
  Filled 2015-07-11: qty 2

## 2015-07-11 MED ORDER — ACETAMINOPHEN 325 MG PO TABS
650.0000 mg | ORAL_TABLET | Freq: Once | ORAL | Status: AC
  Administered 2015-07-11: 650 mg via ORAL

## 2015-07-11 MED ORDER — DIPHENHYDRAMINE HCL 25 MG PO CAPS
ORAL_CAPSULE | ORAL | Status: AC
  Filled 2015-07-11: qty 1

## 2015-07-11 MED ORDER — DIPHENHYDRAMINE HCL 25 MG PO CAPS
25.0000 mg | ORAL_CAPSULE | Freq: Once | ORAL | Status: AC
  Administered 2015-07-11: 25 mg via ORAL

## 2015-07-11 NOTE — Addendum Note (Signed)
Addended by: Adalberto Cole on: 07/11/2015 03:57 PM   Modules accepted: Orders

## 2015-07-11 NOTE — Patient Instructions (Signed)

## 2015-07-11 NOTE — Telephone Encounter (Signed)
Added appt per pof...the patient will get new sched in chemo °

## 2015-07-13 LAB — TYPE AND SCREEN
ABO/RH(D): O POS
Antibody Screen: NEGATIVE
Unit division: 0
Unit division: 0

## 2015-07-17 ENCOUNTER — Other Ambulatory Visit: Payer: Medicare Other

## 2015-07-17 ENCOUNTER — Ambulatory Visit: Payer: Medicare Other

## 2015-07-21 ENCOUNTER — Telehealth: Payer: Self-pay | Admitting: Interventional Cardiology

## 2015-07-21 NOTE — Telephone Encounter (Signed)
Decrease lisinopril HCTZ tab to half tab daily

## 2015-07-21 NOTE — Telephone Encounter (Signed)
New message     Pt states pulse was 100 this morning and between 80-90 this morning  Pt c/o Shortness Of Breath: STAT if SOB developed within the last 24 hours or pt is noticeably SOB on the phone  1. Are you currently SOB (can you hear that pt is SOB on the phone)? No  2. How long have you been experiencing SOB? Pt states ever time blood gets low his SOB get bad  3. Are you SOB when sitting or when up moving around? Moving around  4. Are you currently experiencing any other symptoms? Pt states pulse this morning was running between 80-100; usually runs 65  Please call to discuss

## 2015-07-21 NOTE — Telephone Encounter (Signed)
Pt currently only takes a 1/2 tab of Lisinopril/HCTZ 20-25.

## 2015-07-21 NOTE — Telephone Encounter (Signed)
Spoke with pt and he states that he has been having some SOB recently that is worse with movement but does have it slightly when sitting. Pt states that he gets [facility] when his "blood is low".  Pt states that he had a blood transfusion 8/9 d/t a Hgb of 6.5 and that he goes Wednesday this week to the cancer center to have this rechecked to see if he needs another transfusion. Pt states that this morning his BP was 112/54, HR between 80-100 (Pt checked it several times). Pt states that he took his Metoprolol later than usual because he slept in.  Took Metoprolol around 11AM and says now his BP is 88/46, HR 68.   Pt denies CP, fatigue, lightheadedness or dizziness. Pt states he took his lisinopril/HCTZ last night. Will forward to Dr. Irish Lack for review and advisement. Pt has carotid scheduled for 8/22.

## 2015-07-22 MED ORDER — LISINOPRIL-HYDROCHLOROTHIAZIDE 10-12.5 MG PO TABS: 0.5000 | ORAL_TABLET | Freq: Every day | ORAL | Status: DC

## 2015-07-22 NOTE — Telephone Encounter (Signed)
Would call in a lisinopril/HCTZ 10/12.5 mg tab and take half of that.

## 2015-07-22 NOTE — Telephone Encounter (Signed)
Spoke with pt and informed him that Dr. Irish Lack would like for pt to take Lisniopril/HCTZ 10/12.5- take 1/2 tab daily. Pt verbalized understanding and was in agreement with this plan. Verified pharmacy and prescription ordered.

## 2015-07-23 ENCOUNTER — Other Ambulatory Visit (HOSPITAL_BASED_OUTPATIENT_CLINIC_OR_DEPARTMENT_OTHER): Payer: Medicare Other

## 2015-07-23 ENCOUNTER — Other Ambulatory Visit: Payer: Self-pay | Admitting: Hematology and Oncology

## 2015-07-23 ENCOUNTER — Ambulatory Visit (HOSPITAL_BASED_OUTPATIENT_CLINIC_OR_DEPARTMENT_OTHER): Payer: Medicare Other

## 2015-07-23 ENCOUNTER — Other Ambulatory Visit: Payer: Self-pay | Admitting: *Deleted

## 2015-07-23 VITALS — BP 124/58 | HR 62 | Temp 97.1°F | Resp 20

## 2015-07-23 DIAGNOSIS — D4620 Refractory anemia with excess of blasts, unspecified: Secondary | ICD-10-CM

## 2015-07-23 DIAGNOSIS — D630 Anemia in neoplastic disease: Secondary | ICD-10-CM

## 2015-07-23 DIAGNOSIS — D469 Myelodysplastic syndrome, unspecified: Secondary | ICD-10-CM | POA: Diagnosis not present

## 2015-07-23 LAB — CBC WITH DIFFERENTIAL/PLATELET
BASO%: 0.8 % (ref 0.0–2.0)
Basophils Absolute: 0.1 10*3/uL (ref 0.0–0.1)
EOS%: 0 % (ref 0.0–7.0)
Eosinophils Absolute: 0 10*3/uL (ref 0.0–0.5)
HCT: 22.8 % — ABNORMAL LOW (ref 38.4–49.9)
HEMOGLOBIN: 7.3 g/dL — AB (ref 13.0–17.1)
LYMPH#: 1.5 10*3/uL (ref 0.9–3.3)
LYMPH%: 19.3 % (ref 14.0–49.0)
MCH: 26 pg — ABNORMAL LOW (ref 27.2–33.4)
MCHC: 32 g/dL (ref 32.0–36.0)
MCV: 81.1 fL (ref 79.3–98.0)
MONO#: 1 10*3/uL — ABNORMAL HIGH (ref 0.1–0.9)
MONO%: 13.4 % (ref 0.0–14.0)
NEUT#: 5 10*3/uL (ref 1.5–6.5)
NEUT%: 66.5 % (ref 39.0–75.0)
NRBC: 2 % — AB (ref 0–0)
Platelets: 43 10*3/uL — ABNORMAL LOW (ref 140–400)
RBC: 2.81 10*6/uL — AB (ref 4.20–5.82)
RDW: 21.7 % — ABNORMAL HIGH (ref 11.0–14.6)
WBC: 7.5 10*3/uL (ref 4.0–10.3)

## 2015-07-23 LAB — PREPARE RBC (CROSSMATCH)

## 2015-07-23 LAB — TECHNOLOGIST REVIEW

## 2015-07-23 LAB — HOLD TUBE, BLOOD BANK

## 2015-07-23 MED ORDER — SODIUM CHLORIDE 0.9 % IV SOLN
250.0000 mL | Freq: Once | INTRAVENOUS | Status: AC
  Administered 2015-07-23: 250 mL via INTRAVENOUS

## 2015-07-23 MED ORDER — DIPHENHYDRAMINE HCL 25 MG PO CAPS
ORAL_CAPSULE | ORAL | Status: AC
  Filled 2015-07-23: qty 1

## 2015-07-23 MED ORDER — ACETAMINOPHEN 325 MG PO TABS
ORAL_TABLET | ORAL | Status: AC
  Filled 2015-07-23: qty 2

## 2015-07-23 MED ORDER — ACETAMINOPHEN 325 MG PO TABS
650.0000 mg | ORAL_TABLET | Freq: Once | ORAL | Status: AC
  Administered 2015-07-23: 650 mg via ORAL

## 2015-07-23 MED ORDER — DIPHENHYDRAMINE HCL 25 MG PO CAPS
25.0000 mg | ORAL_CAPSULE | Freq: Once | ORAL | Status: AC
  Administered 2015-07-23: 25 mg via ORAL

## 2015-07-23 NOTE — Patient Instructions (Signed)

## 2015-07-24 ENCOUNTER — Ambulatory Visit: Payer: Medicare Other

## 2015-07-24 ENCOUNTER — Other Ambulatory Visit: Payer: Medicare Other

## 2015-07-24 LAB — TYPE AND SCREEN
ABO/RH(D): O POS
Antibody Screen: NEGATIVE
UNIT DIVISION: 0
UNIT DIVISION: 0

## 2015-07-25 ENCOUNTER — Other Ambulatory Visit: Payer: Medicare Other

## 2015-07-28 ENCOUNTER — Ambulatory Visit (HOSPITAL_COMMUNITY)
Admission: RE | Admit: 2015-07-28 | Discharge: 2015-07-28 | Disposition: A | Discharge status: Home / Self Care | Payer: Medicare Other | Source: Ambulatory Visit | Attending: Physician Assistant | Admitting: Physician Assistant

## 2015-07-28 ENCOUNTER — Encounter (HOSPITAL_COMMUNITY): Payer: PRIVATE HEALTH INSURANCE

## 2015-07-28 DIAGNOSIS — I6523 Occlusion and stenosis of bilateral carotid arteries: Secondary | ICD-10-CM | POA: Diagnosis not present

## 2015-07-28 DIAGNOSIS — I6529 Occlusion and stenosis of unspecified carotid artery: Secondary | ICD-10-CM

## 2015-07-29 ENCOUNTER — Encounter: Payer: Self-pay | Admitting: Interventional Cardiology

## 2015-07-31 ENCOUNTER — Telehealth: Payer: Self-pay | Admitting: *Deleted

## 2015-07-31 ENCOUNTER — Other Ambulatory Visit: Payer: Medicare Other

## 2015-07-31 ENCOUNTER — Ambulatory Visit: Payer: Medicare Other

## 2015-07-31 DIAGNOSIS — I6523 Occlusion and stenosis of bilateral carotid arteries: Secondary | ICD-10-CM

## 2015-07-31 NOTE — Telephone Encounter (Signed)
Pt gave permission to s/w wife about results. Wife verbalized understanding to results by phone and that we will call to schedule appt for 6 months to repeat echo. Wife said ok and thank you.

## 2015-08-07 ENCOUNTER — Ambulatory Visit (HOSPITAL_COMMUNITY)
Admission: RE | Admit: 2015-08-07 | Discharge: 2015-08-07 | Disposition: A | Discharge status: Home / Self Care | Payer: Medicare Other | Source: Ambulatory Visit | Attending: Hematology and Oncology | Admitting: Hematology and Oncology

## 2015-08-07 DIAGNOSIS — D469 Myelodysplastic syndrome, unspecified: Secondary | ICD-10-CM | POA: Insufficient documentation

## 2015-08-08 ENCOUNTER — Other Ambulatory Visit (HOSPITAL_BASED_OUTPATIENT_CLINIC_OR_DEPARTMENT_OTHER): Payer: Medicare Other

## 2015-08-08 ENCOUNTER — Other Ambulatory Visit: Payer: Self-pay | Admitting: *Deleted

## 2015-08-08 ENCOUNTER — Other Ambulatory Visit: Payer: Self-pay | Admitting: Hematology and Oncology

## 2015-08-08 ENCOUNTER — Ambulatory Visit (HOSPITAL_BASED_OUTPATIENT_CLINIC_OR_DEPARTMENT_OTHER): Payer: Medicare Other

## 2015-08-08 VITALS — BP 100/55 | HR 64 | Temp 97.9°F | Resp 20

## 2015-08-08 DIAGNOSIS — D4620 Refractory anemia with excess of blasts, unspecified: Secondary | ICD-10-CM | POA: Diagnosis present

## 2015-08-08 DIAGNOSIS — D469 Myelodysplastic syndrome, unspecified: Secondary | ICD-10-CM | POA: Diagnosis present

## 2015-08-08 DIAGNOSIS — D630 Anemia in neoplastic disease: Secondary | ICD-10-CM

## 2015-08-08 LAB — CBC & DIFF AND RETIC
BASO%: 1.1 % (ref 0.0–2.0)
Basophils Absolute: 0.1 10*3/uL (ref 0.0–0.1)
EOS%: 0.2 % (ref 0.0–7.0)
Eosinophils Absolute: 0 10*3/uL (ref 0.0–0.5)
HEMATOCRIT: 21.1 % — AB (ref 38.4–49.9)
HEMOGLOBIN: 6.9 g/dL — AB (ref 13.0–17.1)
Immature Retic Fract: 19.5 % — ABNORMAL HIGH (ref 3.00–10.60)
LYMPH%: 21.8 % (ref 14.0–49.0)
MCH: 26.5 pg — ABNORMAL LOW (ref 27.2–33.4)
MCHC: 32.7 g/dL (ref 32.0–36.0)
MCV: 81.2 fL (ref 79.3–98.0)
MONO#: 0.9 10*3/uL (ref 0.1–0.9)
MONO%: 14.2 % — ABNORMAL HIGH (ref 0.0–14.0)
NEUT#: 4 10*3/uL (ref 1.5–6.5)
NEUT%: 62.7 % (ref 39.0–75.0)
PLATELETS: 36 10*3/uL — AB (ref 140–400)
RBC: 2.6 10*6/uL — ABNORMAL LOW (ref 4.20–5.82)
RDW: 19.4 % — ABNORMAL HIGH (ref 11.0–14.6)
RETIC CT ABS: 48.88 10*3/uL (ref 34.80–93.90)
Retic %: 1.88 % — ABNORMAL HIGH (ref 0.80–1.80)
WBC: 6.3 10*3/uL (ref 4.0–10.3)
lymph#: 1.4 10*3/uL (ref 0.9–3.3)
nRBC: 3 % — ABNORMAL HIGH (ref 0–0)

## 2015-08-08 LAB — HOLD TUBE, BLOOD BANK

## 2015-08-08 LAB — PREPARE RBC (CROSSMATCH)

## 2015-08-08 MED ORDER — ACETAMINOPHEN 325 MG PO TABS
650.0000 mg | ORAL_TABLET | Freq: Once | ORAL | Status: AC
  Administered 2015-08-08: 650 mg via ORAL

## 2015-08-08 MED ORDER — SODIUM CHLORIDE 0.9 % IV SOLN
250.0000 mL | Freq: Once | INTRAVENOUS | Status: AC
  Administered 2015-08-08: 250 mL via INTRAVENOUS

## 2015-08-08 MED ORDER — ACETAMINOPHEN 325 MG PO TABS
ORAL_TABLET | ORAL | Status: AC
  Filled 2015-08-08: qty 2

## 2015-08-08 MED ORDER — DIPHENHYDRAMINE HCL 25 MG PO CAPS
ORAL_CAPSULE | ORAL | Status: AC
  Filled 2015-08-08: qty 1

## 2015-08-08 MED ORDER — DIPHENHYDRAMINE HCL 25 MG PO CAPS
25.0000 mg | ORAL_CAPSULE | Freq: Once | ORAL | Status: AC
  Administered 2015-08-08: 25 mg via ORAL

## 2015-08-08 NOTE — Addendum Note (Signed)
Addended by: Adalberto Cole on: 08/08/2015 03:14 PM   Modules accepted: Orders

## 2015-08-08 NOTE — Patient Instructions (Signed)

## 2015-08-10 LAB — TYPE AND SCREEN
ABO/RH(D): O POS
Antibody Screen: NEGATIVE
UNIT DIVISION: 0
Unit division: 0

## 2015-08-14 ENCOUNTER — Other Ambulatory Visit: Payer: Medicare Other

## 2015-08-14 ENCOUNTER — Ambulatory Visit: Payer: Medicare Other

## 2015-08-22 ENCOUNTER — Encounter: Payer: Self-pay | Admitting: Hematology and Oncology

## 2015-08-22 ENCOUNTER — Other Ambulatory Visit: Payer: Self-pay | Admitting: Hematology and Oncology

## 2015-08-22 ENCOUNTER — Ambulatory Visit (HOSPITAL_BASED_OUTPATIENT_CLINIC_OR_DEPARTMENT_OTHER): Payer: Medicare Other | Admitting: Hematology and Oncology

## 2015-08-22 ENCOUNTER — Other Ambulatory Visit (HOSPITAL_BASED_OUTPATIENT_CLINIC_OR_DEPARTMENT_OTHER): Payer: Medicare Other

## 2015-08-22 ENCOUNTER — Ambulatory Visit (HOSPITAL_BASED_OUTPATIENT_CLINIC_OR_DEPARTMENT_OTHER): Payer: Medicare Other

## 2015-08-22 VITALS — BP 108/59 | HR 60 | Temp 97.9°F | Resp 18

## 2015-08-22 VITALS — BP 94/44 | HR 67 | Temp 98.0°F | Resp 18 | Ht 68.0 in | Wt 176.1 lb

## 2015-08-22 DIAGNOSIS — D46Z Other myelodysplastic syndromes: Secondary | ICD-10-CM

## 2015-08-22 DIAGNOSIS — D4620 Refractory anemia with excess of blasts, unspecified: Secondary | ICD-10-CM

## 2015-08-22 DIAGNOSIS — D630 Anemia in neoplastic disease: Secondary | ICD-10-CM

## 2015-08-22 DIAGNOSIS — Z23 Encounter for immunization: Secondary | ICD-10-CM | POA: Diagnosis not present

## 2015-08-22 DIAGNOSIS — D469 Myelodysplastic syndrome, unspecified: Secondary | ICD-10-CM | POA: Diagnosis not present

## 2015-08-22 DIAGNOSIS — R509 Fever, unspecified: Secondary | ICD-10-CM | POA: Insufficient documentation

## 2015-08-22 DIAGNOSIS — Z299 Encounter for prophylactic measures, unspecified: Secondary | ICD-10-CM

## 2015-08-22 LAB — CBC & DIFF AND RETIC
BASO%: 0.8 % (ref 0.0–2.0)
Basophils Absolute: 0 10*3/uL (ref 0.0–0.1)
EOS%: 0 % (ref 0.0–7.0)
Eosinophils Absolute: 0 10*3/uL (ref 0.0–0.5)
HCT: 20.1 % — ABNORMAL LOW (ref 38.4–49.9)
HGB: 6.6 g/dL — CL (ref 13.0–17.1)
Immature Retic Fract: 13.4 % — ABNORMAL HIGH (ref 3.00–10.60)
LYMPH#: 1.2 10*3/uL (ref 0.9–3.3)
LYMPH%: 23.8 % (ref 14.0–49.0)
MCH: 26.8 pg — ABNORMAL LOW (ref 27.2–33.4)
MCHC: 32.8 g/dL (ref 32.0–36.0)
MCV: 81.7 fL (ref 79.3–98.0)
MONO#: 0.8 10*3/uL (ref 0.1–0.9)
MONO%: 15.4 % — ABNORMAL HIGH (ref 0.0–14.0)
NEUT#: 2.9 10*3/uL (ref 1.5–6.5)
NEUT%: 60 % (ref 39.0–75.0)
Platelets: 35 10*3/uL — ABNORMAL LOW (ref 140–400)
RBC: 2.46 10*6/uL — AB (ref 4.20–5.82)
RDW: 17.3 % — AB (ref 11.0–14.6)
RETIC %: 1.38 % (ref 0.80–1.80)
RETIC CT ABS: 33.95 10*3/uL — AB (ref 34.80–93.90)
WBC: 4.9 10*3/uL (ref 4.0–10.3)

## 2015-08-22 LAB — TECHNOLOGIST REVIEW

## 2015-08-22 LAB — PREPARE RBC (CROSSMATCH)

## 2015-08-22 LAB — HOLD TUBE, BLOOD BANK

## 2015-08-22 MED ORDER — DIPHENHYDRAMINE HCL 25 MG PO CAPS
25.0000 mg | ORAL_CAPSULE | Freq: Once | ORAL | Status: AC
  Administered 2015-08-22: 25 mg via ORAL

## 2015-08-22 MED ORDER — ACETAMINOPHEN 325 MG PO TABS
650.0000 mg | ORAL_TABLET | Freq: Once | ORAL | Status: AC
  Administered 2015-08-22: 650 mg via ORAL

## 2015-08-22 MED ORDER — DIPHENHYDRAMINE HCL 25 MG PO CAPS
ORAL_CAPSULE | ORAL | Status: AC
  Filled 2015-08-22: qty 1

## 2015-08-22 MED ORDER — SODIUM CHLORIDE 0.9 % IV SOLN
250.0000 mL | Freq: Once | INTRAVENOUS | Status: AC
  Administered 2015-08-22: 250 mL via INTRAVENOUS

## 2015-08-22 MED ORDER — INFLUENZA VAC SPLIT QUAD 0.5 ML IM SUSY
0.5000 mL | PREFILLED_SYRINGE | Freq: Once | INTRAMUSCULAR | Status: AC
Start: 2015-08-22 — End: 2015-08-22
  Administered 2015-08-22: 0.5 mL via INTRAMUSCULAR
  Filled 2015-08-22: qty 0.5

## 2015-08-22 MED ORDER — ACETAMINOPHEN 325 MG PO TABS
ORAL_TABLET | ORAL | Status: AC
  Filled 2015-08-22: qty 2

## 2015-08-22 NOTE — Assessment & Plan Note (Signed)
Unfortunately, the patient is on some sort of waiting list to be enrolled in clinical trial [facility]. In the meantime, he is now transfusion dependent. I will get in touch with his physician at [facility] for further updates. He will return every other week for blood transfusion in the meantime.

## 2015-08-22 NOTE — Assessment & Plan Note (Signed)
We discussed some of the risks, benefits, and alternatives of blood transfusions. The patient is symptomatic from anemia and the hemoglobin level is critically low.  Some of the side-effects to be expected including risks of transfusion reactions, chills, infection, syndrome of volume overload and risk of hospitalization from various reasons and the patient is willing to proceed and went ahead to sign consent today. The patient will get 2 units of blood whenever hemoglobin dropped to less than 8.

## 2015-08-22 NOTE — Assessment & Plan Note (Signed)
We discussed the importance of preventive care and reviewed the vaccination programs. He does not have any prior allergic reactions to influenza vaccination. He agrees to proceed with influenza vaccination today and we will administer it today at the clinic.  

## 2015-08-22 NOTE — Progress Notes (Signed)
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 He returns for further follow-up. He denies pain. He had intermittent fevers and chills at home, maximum temperature of 101.3, resolved with Tylenol. He had intermittent cough sporadically. Had occasional chills. He complained of fatigue. The patient denies any recent signs or symptoms of bleeding such as spontaneous epistaxis, hematuria or hematochezia.   REVIEW OF SYSTEMS:   Eyes: Denies blurriness of vision Ears, nose, mouth, throat, and face: Denies mucositis or sore throat Cardiovascular: Denies palpitation, chest discomfort or lower extremity swelling Gastrointestinal:  Denies nausea, heartburn or change in bowel habits Skin: Denies abnormal skin rashes Lymphatics: Denies new lymphadenopathy or easy bruising Neurological:Denies numbness, tingling or new weaknesses Behavioral/Psych: Mood is stable, no new changes  All other systems were reviewed with the patient and are negative.  I have reviewed the past medical history, past surgical history, social history and family history with the patient  and they are unchanged from previous note.  ALLERGIES:  has No Known Allergies.  MEDICATIONS:  Current Outpatient Prescriptions  Medication Sig Dispense Refill  . acetaminophen (TYLENOL) 500 MG tablet Take 1,000 mg by mouth every 6 (six) hours as needed for pain.    Marland Kitchen alendronate (FOSAMAX) 70 MG tablet Take 70 mg by mouth once a week. On Friday. Take with a full glass of water on an empty stomach.    Marland Kitchen allopurinol (ZYLOPRIM) 100 MG tablet Take 100 mg by mouth daily.    Marland Kitchen ALPRAZolam (XANAX) 0.25 MG tablet Take 0.125 mg by mouth 2 (two) times daily as needed for anxiety.   0  . amoxicillin (AMOXIL) 500 MG tablet Take 1 tablet (500 mg total) by mouth 2 (two) times daily. 14 tablet 0  . amoxicillin-clavulanate (AUGMENTIN) 875-125 MG per tablet Take 1 tablet by mouth 2 (two) times daily. 14 tablet 0  . atorvastatin (LIPITOR) 20 MG tablet Take 1 tablet (20 mg total) by mouth daily. 90 tablet 3  . Calcium-Vitamin D (CALTRATE 600 PLUS-VIT D PO) Take 1 tablet by mouth 2 (two) times daily.     . Darbepoetin Alfa (ARANESP) 500 MCG/ML SOSY injection Inject 500 mcg into the skin every 14 (fourteen) days.     Marland Kitchen lisinopril-hydrochlorothiazide (PRINZIDE,ZESTORETIC) 10-12.5 MG per tablet Take 0.5 tablets by mouth daily. 45 tablet 2  . loratadine (CLARITIN) 10 MG tablet Take 10 mg by mouth daily.    . metoprolol (LOPRESSOR) 50 MG tablet take 1/2 tablet by mouth twice a day 30 tablet 6  . MULTIPLE VITAMINS-MINERALS PO Take 1 tablet by mouth 2 (two) times daily.    Marland Kitchen NITROSTAT 0.4 MG SL tablet Place 0.4 mg under the tongue every 5 (five) minutes as needed.   0  . Omega-3  Fatty Acids (FISH OIL) 1000 MG CAPS Take 2,000 mg by mouth 2 (two) times daily.    . ondansetron (ZOFRAN ODT) 4 MG disintegrating tablet 4mg  ODT q4 hours prn nausea/vomit 15 tablet 0  . predniSONE (DELTASONE) 5 MG tablet Take 7.5 mg by mouth daily.     . traMADol (ULTRAM) 50 MG tablet Take 25 mg by mouth every 4 (four) hours as needed for pain.       No current facility-administered medications for this visit.    PHYSICAL EXAMINATION: ECOG PERFORMANCE STATUS: 2 - Symptomatic, <50% confined to bed  Filed Vitals:   08/22/15 0951  BP: 94/44  Pulse: 67  Temp: 98 F (36.7 C)  Resp: 18   Filed Weights   08/22/15 0951  Weight: 176 lb 1.6 oz (79.878 kg)    GENERAL:alert, no distress and comfortable SKIN: skin color, texture, turgor are normal, no rashes or significant lesions had skin bruises EYES: normal, Conjunctiva are pink and non-injected, sclera clear OROPHARYNX:no exudate, no erythema and lips, buccal mucosa, and tongue normal  NECK: supple, thyroid normal size, non-tender, without nodularity LYMPH:  no palpable lymphadenopathy in the cervical, axillary or inguinal LUNGS: clear to auscultation and percussion with normal breathing effort HEART: regular rate & rhythm and no murmurs and no lower extremity edema ABDOMEN:abdomen soft, non-tender and normal bowel sounds Musculoskeletal:no cyanosis of digits and no clubbing  NEURO: alert & oriented x 3 with fluent speech, no focal motor/sensory deficits  LABORATORY DATA:  I have reviewed the data as listed    Component Value Date/Time   NA 139 06/03/2015 0936   NA 138 05/30/2014 1138   K 4.4 06/03/2015 0936   K 3.7 05/30/2014 1138   CL 104 06/03/2015 0936   CL 102 04/09/2013 1347   CO2 26 06/03/2015 0936   CO2 24 05/30/2014 1138   GLUCOSE 97 06/03/2015 0936   GLUCOSE 147* 05/30/2014 1138   GLUCOSE 113* 04/09/2013 1347   BUN 33* 06/03/2015 0936   BUN 26.2* 05/30/2014 1138   CREATININE 1.03 06/03/2015 0936   CREATININE 0.8 05/30/2014 1138   CALCIUM 9.2 06/03/2015 0936   CALCIUM 9.8 05/30/2014 1138   PROT 7.3 06/03/2015 0936   PROT 7.4 11/22/2013 0847   ALBUMIN 3.6 06/03/2015 0936   ALBUMIN 4.1 11/22/2013 0847   AST 22 06/03/2015 0936   AST 15 11/22/2013 0847   ALT 18 06/03/2015 0936   ALT 15 11/22/2013 0847   ALKPHOS 65 06/03/2015 0936   ALKPHOS 48  11/22/2013 0847   BILITOT 1.2 06/03/2015 0936   BILITOT 1.15 11/22/2013 0847   GFRNONAA >60 06/03/2015 0936   GFRAA >60 06/03/2015 0936    No results found for: SPEP, UPEP  Lab Results  Component Value Date   WBC 4.9 08/22/2015   NEUTROABS 2.9 08/22/2015   HGB 6.6* 08/22/2015   HCT 20.1* 08/22/2015   MCV 81.7 08/22/2015   PLT 35* 08/22/2015      Chemistry      Component Value Date/Time   NA 139 06/03/2015 0936   NA 138 05/30/2014 1138   K 4.4 06/03/2015 0936   K 3.7 05/30/2014 1138   CL 104 06/03/2015 0936   CL 102 04/09/2013 1347   CO2 26 06/03/2015 0936   CO2 24 05/30/2014 1138   BUN 33* 06/03/2015 0936   BUN 26.2* 05/30/2014 1138   CREATININE 1.03 06/03/2015 0936   CREATININE 0.8 05/30/2014 1138      Component Value Date/Time   CALCIUM 9.2  06/03/2015 0936   CALCIUM 9.8 05/30/2014 1138   ALKPHOS 65 06/03/2015 0936   ALKPHOS 48 11/22/2013 0847   AST 22 06/03/2015 0936   AST 15 11/22/2013 0847   ALT 18 06/03/2015 0936   ALT 15 11/22/2013 0847   BILITOT 1.2 06/03/2015 0936   BILITOT 1.15 11/22/2013 0847      ASSESSMENT & PLAN:  MDS (myelodysplastic syndrome), low grade Unfortunately, the patient is on some sort of waiting list to be enrolled in clinical trial [facility]. In the meantime, he is now transfusion dependent. I will get in touch with his physician at [facility] for further updates. He will return every other week for blood transfusion in the meantime.  Anemia in neoplastic disease We discussed some of the risks, benefits, and alternatives of blood transfusions. The patient is symptomatic from anemia and the hemoglobin level is critically low.  Some of the side-effects to be expected including risks of transfusion reactions, chills, infection, syndrome of volume overload and risk of hospitalization from various reasons and the patient is willing to proceed and went ahead to sign consent today. The patient will get 2 units of blood whenever  hemoglobin dropped to less than 8.  Fever and chills He had intermittent fevers and chills recently. He denies any localizing signs such diarrhea, skin rashes or sore throat. He does have some mild nonproductive cough. Previously, I gave him prescription antibiotics but he has not taken it. I recommend he proceed to take antibiotics just in case he has some sort of low-grade infection.  Preventive measure We discussed the importance of preventive care and reviewed the vaccination programs. He does not have any prior allergic reactions to influenza vaccination. He agrees to proceed with influenza vaccination today and we will administer it today at the clinic.    No orders of the defined types were placed in this encounter.   All questions were answered. The patient knows to call the clinic with any problems, questions or concerns. No barriers to learning was detected. I spent 25 minutes counseling the patient face to face. The total time spent in the appointment was 30 minutes and more than 50% was on counseling and review of test results     [facility], [place], MD 08/22/2015 3:21 PM

## 2015-08-22 NOTE — Patient Instructions (Signed)

## 2015-08-22 NOTE — Assessment & Plan Note (Signed)
He had intermittent fevers and chills recently. He denies any localizing signs such diarrhea, skin rashes or sore throat. He does have some mild nonproductive cough. Previously, I gave him prescription antibiotics but he has not taken it. I recommend he proceed to take antibiotics just in case he has some sort of low-grade infection.

## 2015-08-23 LAB — TYPE AND SCREEN
ABO/RH(D): O POS
ANTIBODY SCREEN: NEGATIVE
UNIT DIVISION: 0
Unit division: 0

## 2015-08-28 ENCOUNTER — Ambulatory Visit: Payer: Medicare Other

## 2015-08-28 ENCOUNTER — Other Ambulatory Visit: Payer: Medicare Other

## 2015-09-01 ENCOUNTER — Telehealth: Payer: Self-pay | Admitting: Hematology and Oncology

## 2015-09-01 NOTE — Telephone Encounter (Signed)
err

## 2015-09-03 ENCOUNTER — Telehealth: Payer: Self-pay | Admitting: *Deleted

## 2015-09-03 NOTE — Telephone Encounter (Signed)
Dr. Alvy Bimler s/w Dr. Rudean Hitt and he has not heard from Pathology yet to determine if pt eligible for their clinical trial.  He will call Pathology himself to see if results ready and call Dr. Alvy Bimler back. I informed pt and wife of this.   Wife is tearful on phone states very difficult to wait for answers while meanwhile pt not on any treatment and she feels like she is watching him decline before her eyes.  She reports pt had fever on 9/17 day after last transfusion.  He started taking antibiotic and took for 7 days as directed.  He then had a fever last Saturday 9/24 the day after he stopped the antibiotic.  His coughing improved on the antibiotic but he still has white sputum and congestion per wife.   Dr. Alvy Bimler wants to see pt tomorrow.  Instructed wife/pt to come in tomorrow at 10 am for lab/MD visit.  They verbalized understanding.

## 2015-09-03 NOTE — Telephone Encounter (Signed)
Pt left VM asking about plan?  He says he has not heard anything and not sure what he is supposed to be doing.   He had a fever of 102.2 F at home yesterday but had already started a antibiotic a few days prior.

## 2015-09-03 NOTE — Telephone Encounter (Signed)
Dr. Alvy Bimler has not heard from Dr. Rudean Hitt. S/w Colletta Maryland, RN at [facility].  She has sent two messages to Dr. Rudean Hitt to call Dr. Alvy Bimler about pt.. She will send another message.

## 2015-09-04 ENCOUNTER — Ambulatory Visit (HOSPITAL_BASED_OUTPATIENT_CLINIC_OR_DEPARTMENT_OTHER): Payer: Medicare Other | Admitting: Hematology and Oncology

## 2015-09-04 ENCOUNTER — Telehealth: Payer: Self-pay | Admitting: Hematology and Oncology

## 2015-09-04 ENCOUNTER — Other Ambulatory Visit (HOSPITAL_BASED_OUTPATIENT_CLINIC_OR_DEPARTMENT_OTHER): Payer: Medicare Other

## 2015-09-04 ENCOUNTER — Other Ambulatory Visit: Payer: Medicare Other

## 2015-09-04 ENCOUNTER — Ambulatory Visit: Payer: Medicare Other

## 2015-09-04 ENCOUNTER — Other Ambulatory Visit: Payer: Self-pay | Admitting: Hematology and Oncology

## 2015-09-04 VITALS — BP 104/44 | HR 68 | Temp 98.4°F | Resp 18 | Ht 68.0 in | Wt 172.7 lb

## 2015-09-04 DIAGNOSIS — D696 Thrombocytopenia, unspecified: Secondary | ICD-10-CM | POA: Diagnosis not present

## 2015-09-04 DIAGNOSIS — D471 Chronic myeloproliferative disease: Secondary | ICD-10-CM

## 2015-09-04 DIAGNOSIS — Z515 Encounter for palliative care: Secondary | ICD-10-CM | POA: Insufficient documentation

## 2015-09-04 DIAGNOSIS — M069 Rheumatoid arthritis, unspecified: Secondary | ICD-10-CM

## 2015-09-04 DIAGNOSIS — D630 Anemia in neoplastic disease: Secondary | ICD-10-CM | POA: Diagnosis not present

## 2015-09-04 DIAGNOSIS — D469 Myelodysplastic syndrome, unspecified: Secondary | ICD-10-CM

## 2015-09-04 DIAGNOSIS — D4620 Refractory anemia with excess of blasts, unspecified: Secondary | ICD-10-CM

## 2015-09-04 DIAGNOSIS — D46Z Other myelodysplastic syndromes: Secondary | ICD-10-CM

## 2015-09-04 DIAGNOSIS — D474 Osteomyelofibrosis: Secondary | ICD-10-CM

## 2015-09-04 DIAGNOSIS — R509 Fever, unspecified: Secondary | ICD-10-CM

## 2015-09-04 DIAGNOSIS — R6883 Chills (without fever): Secondary | ICD-10-CM

## 2015-09-04 LAB — HOLD TUBE, BLOOD BANK

## 2015-09-04 LAB — CBC & DIFF AND RETIC
BASO%: 0.5 % (ref 0.0–2.0)
BASOS ABS: 0 10*3/uL (ref 0.0–0.1)
EOS%: 0 % (ref 0.0–7.0)
Eosinophils Absolute: 0 10*3/uL (ref 0.0–0.5)
HEMATOCRIT: 21.6 % — AB (ref 38.4–49.9)
HGB: 7.1 g/dL — ABNORMAL LOW (ref 13.0–17.1)
Immature Retic Fract: 13.1 % — ABNORMAL HIGH (ref 3.00–10.60)
LYMPH#: 1.2 10*3/uL (ref 0.9–3.3)
LYMPH%: 21.3 % (ref 14.0–49.0)
MCH: 26.3 pg — AB (ref 27.2–33.4)
MCHC: 32.9 g/dL (ref 32.0–36.0)
MCV: 80 fL (ref 79.3–98.0)
MONO#: 0.9 10*3/uL (ref 0.1–0.9)
MONO%: 15.7 % — ABNORMAL HIGH (ref 0.0–14.0)
NEUT%: 62.5 % (ref 39.0–75.0)
NEUTROS ABS: 3.6 10*3/uL (ref 1.5–6.5)
Platelets: 33 10*3/uL — ABNORMAL LOW (ref 140–400)
RBC: 2.7 10*6/uL — AB (ref 4.20–5.82)
RDW: 17.1 % — AB (ref 11.0–14.6)
RETIC %: 1.5 % (ref 0.80–1.80)
RETIC CT ABS: 40.5 10*3/uL (ref 34.80–93.90)
WBC: 5.7 10*3/uL (ref 4.0–10.3)

## 2015-09-04 LAB — TECHNOLOGIST REVIEW

## 2015-09-04 MED ORDER — MIRTAZAPINE 15 MG PO TABS: 15.0000 mg | ORAL_TABLET | Freq: Every day | ORAL | Status: DC

## 2015-09-04 MED ORDER — LEVOFLOXACIN 500 MG PO TABS: 500.0000 mg | ORAL_TABLET | Freq: Every day | ORAL | Status: DC

## 2015-09-04 MED ORDER — RUXOLITINIB PHOSPHATE 5 MG PO TABS: 5.0000 mg | ORAL_TABLET | Freq: Two times a day (BID) | ORAL | Status: DC

## 2015-09-04 NOTE — Assessment & Plan Note (Addendum)
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 The patient is not a candidate for clinical trial. We discussed treatment options which are limited. The patient wants to preserve quality of life. The hematologist for [facility] suggested a trial of Jakafi but after much review of chart, it was noted that the patient had low platelet counts. The drug is not approved for platelet count less than 50,000. At the time of discussion with the patient, he is in agreement to just receive palliative supportive care/transfusion support. In the meantime, we will continue blood transfusion support every 2 weeks to keep hemoglobin above 8 g.

## 2015-09-04 NOTE — Assessment & Plan Note (Signed)
We discussed some of the risks, benefits, and alternatives of blood transfusions. The patient is symptomatic from anemia and the hemoglobin level is critically low.  Some of the side-effects to be expected including risks of transfusion reactions, chills, infection, syndrome of volume overload and risk of hospitalization from various reasons and the patient is willing to proceed and went ahead to sign consent today. The patient will get 2 units of blood whenever hemoglobin dropped to less than 8.

## 2015-09-04 NOTE — Assessment & Plan Note (Signed)
We have long discussion about palliative care and end-of-life care. The patient is aware that he is not doing well. There is no clinical trial available and I would not recommending systemic chemotherapy. After much discussion, we are in agreement for supportive transfusion only. We discussed last directive and living will. He will think about getting all his paperwork in place. For now, he wants supportive therapy. I recommend the patient to make sure that his CODE STATUS is DO NOT RESUSCITATE He had problems sleeping at nighttime. I recommend a trial of mirtazapine.

## 2015-09-04 NOTE — Assessment & Plan Note (Signed)
We'll continue low-dose prednisone for now

## 2015-09-04 NOTE — Assessment & Plan Note (Signed)
He has recurrent fevers and chills of unknown etiology. There were no localizing signs. It is likely could be related to tumor fever. The patient is at risk of infection due to nonfunctioning bone marrow. They gave him antibiotics to hang onto. He had symptomatic improvement after recent antibiotics therapy. He took Augmentin recently. I will switch him to levofloxacin to take the next time he spiked high-grade fever in the future.

## 2015-09-04 NOTE — Telephone Encounter (Signed)
Gave and printed appt sched and avs fo rpt for OCT thru [facility] °

## 2015-09-04 NOTE — Assessment & Plan Note (Signed)
He has significant drop off his platelet count due to splenomegaly I suspect it could be a progression of his bone marrow disease. In the meantime, I told him to hold off taking aspirin therapy until his platelet count rebound to greater than 50,000. About from bruising, he is not symptomatic with bleeding. He does not require platelet transfusion today.

## 2015-09-04 NOTE — Progress Notes (Signed)
[place] OFFICE PROGRESS NOTE  Patient Care Team: Jonathon Jordan, MD as PCP - General (Family Medicine) Ronald Lobo, MD (Gastroenterology) Jonathon Jordan, MD as Attending Physician (Family Medicine) Heath Lark, MD as Consulting Physician (Hematology and Oncology) Roxana Hires, MD as Consulting Physician (Hematology and Oncology)  SUMMARY OF ONCOLOGIC HISTORY: Oncology History   R-IPSS score 3.5     MDS (myelodysplastic syndrome), low grade   04/26/2013 Bone Marrow Biopsy BM biopsy confirmed MDS with multilineage dysplasia, 4% blast, normal cytogenetics.   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 He returns for further follow-up. He has symptomatic fatigue. He has occasional dizziness and chest pain whenever his hemoglobin drops. He bruises easily. The patient denies any recent signs or symptoms of bleeding such as spontaneous epistaxis, hematuria or hematochezia. The patient have recurrent fevers recently. He took prescription Augmentin that I prescribed for him and it got his fever down and he felt better. He has occasional chills. His wife is very tearful about his poor prognosis.  REVIEW OF SYSTEMS:   Eyes: Denies blurriness of vision Ears, nose, mouth, throat, and face: Denies mucositis or sore throat Respiratory: Denies cough, dyspnea or wheezes Gastrointestinal:  Denies nausea, heartburn or change in bowel habits Skin: Denies abnormal skin rashes Lymphatics: Denies new lymphadenopathy or easy bruising Neurological:Denies numbness, tingling or new weaknesses Behavioral/Psych: Mood is stable, no new changes  All other systems were reviewed with the patient and are negative.  I have  reviewed the past medical history, past surgical history, social history and family history with the patient and they are unchanged from previous note.  ALLERGIES:  has No Known Allergies.  MEDICATIONS:  Current Outpatient Prescriptions  Medication Sig Dispense Refill  . acetaminophen (TYLENOL) 500 MG tablet Take 1,000 mg by mouth every 6 (six) hours as needed for pain.    Marland Kitchen alendronate (FOSAMAX) 70 MG tablet Take 70 mg by mouth once a week. On Friday. Take with a full glass of water on an empty stomach.    Marland Kitchen allopurinol (ZYLOPRIM) 100 MG tablet Take 100 mg by mouth daily.    Marland Kitchen ALPRAZolam (XANAX) 0.25 MG tablet Take 0.125 mg by mouth 2 (two) times daily as needed for anxiety.   0  . atorvastatin (LIPITOR) 20 MG tablet Take 1 tablet (20 mg total) by mouth daily. 90 tablet 3  . Calcium-Vitamin D (CALTRATE 600 PLUS-VIT D PO) Take 1 tablet by mouth 2 (two) times daily.     . Darbepoetin Alfa (ARANESP) 500 MCG/ML SOSY injection Inject 500 mcg into the skin every 14 (fourteen) days.     Marland Kitchen lisinopril-hydrochlorothiazide (PRINZIDE,ZESTORETIC) 10-12.5 MG per tablet Take 0.5 tablets by mouth daily. 45 tablet 2  . loratadine (CLARITIN) 10 MG tablet Take 10 mg by mouth daily.    . metoprolol (LOPRESSOR) 50 MG tablet take 1/2 tablet by mouth twice a day 30 tablet 6  . MULTIPLE VITAMINS-MINERALS PO Take 1 tablet by mouth 2 (two) times daily.    Marland Kitchen NITROSTAT 0.4 MG SL tablet Place 0.4 mg under the tongue every 5 (five) minutes as needed.   0  . Omega-3 Fatty Acids (FISH OIL) 1000 MG CAPS Take 2,000 mg by mouth 2 (two) times daily.    Marland Kitchen  ondansetron (ZOFRAN ODT) 4 MG disintegrating tablet 75m ODT q4 hours prn nausea/vomit 15 tablet 0  . predniSONE (DELTASONE) 5 MG tablet Take 7.5 mg by mouth daily.     . traMADol (ULTRAM) 50 MG tablet Take 25 mg by mouth every 4 (four) hours as needed for pain.     .Marland Kitchenlevofloxacin (LEVAQUIN) 500 MG tablet Take 1 tablet (500 mg total) by mouth daily. 14 tablet 0  . mirtazapine  (REMERON) 15 MG tablet Take 1 tablet (15 mg total) by mouth at bedtime. 30 tablet 6  . ruxolitinib phosphate (JAKAFI) 5 MG tablet Take 1 tablet (5 mg total) by mouth 2 (two) times daily. 60 tablet 3   No current facility-administered medications for this visit.    PHYSICAL EXAMINATION: ECOG PERFORMANCE STATUS: 2 - Symptomatic, <50% confined to bed  Filed Vitals:   09/04/15 1018  BP: 104/44  Pulse: 68  Temp: 98.4 F (36.9 C)  Resp: 18   Filed Weights   09/04/15 1018  Weight: 172 lb 11.2 oz (78.336 kg)    GENERAL:alert, no distress and comfortable SKIN: skin color, texture, turgor are normal, no rashes or significant lesions EYES: normal, Conjunctiva are pink and non-injected, sclera clear OROPHARYNX:no exudate, no erythema and lips, buccal mucosa, and tongue normal  NECK: supple, thyroid normal size, non-tender, without nodularity LYMPH:  no palpable lymphadenopathy in the cervical, axillary or inguinal LUNGS: clear to auscultation and percussion with normal breathing effort HEART: regular rate & rhythm and no murmurs and no lower extremity edema ABDOMEN:abdomen soft, non-tender and normal bowel sounds Musculoskeletal:no cyanosis of digits and no clubbing  NEURO: alert & oriented x 3 with fluent speech, no focal motor/sensory deficits  LABORATORY DATA:  I have reviewed the data as listed    Component Value Date/Time   NA 139 06/03/2015 0936   NA 138 05/30/2014 1138   K 4.4 06/03/2015 0936   K 3.7 05/30/2014 1138   CL 104 06/03/2015 0936   CL 102 04/09/2013 1347   CO2 26 06/03/2015 0936   CO2 24 05/30/2014 1138   GLUCOSE 97 06/03/2015 0936   GLUCOSE 147* 05/30/2014 1138   GLUCOSE 113* 04/09/2013 1347   BUN 33* 06/03/2015 0936   BUN 26.2* 05/30/2014 1138   CREATININE 1.03 06/03/2015 0936   CREATININE 0.8 05/30/2014 1138   CALCIUM 9.2 06/03/2015 0936   CALCIUM 9.8 05/30/2014 1138   PROT 7.3 06/03/2015 0936   PROT 7.4 11/22/2013 0847   ALBUMIN 3.6 06/03/2015 0936    ALBUMIN 4.1 11/22/2013 0847   AST 22 06/03/2015 0936   AST 15 11/22/2013 0847   ALT 18 06/03/2015 0936   ALT 15 11/22/2013 0847   ALKPHOS 65 06/03/2015 0936   ALKPHOS 48 11/22/2013 0847   BILITOT 1.2 06/03/2015 0936   BILITOT 1.15 11/22/2013 0847   GFRNONAA >60 06/03/2015 0936   GFRAA >60 06/03/2015 0936    No results found for: SPEP, UPEP  Lab Results  Component Value Date   WBC 5.7 09/04/2015   NEUTROABS 3.6 09/04/2015   HGB 7.1* 09/04/2015   HCT 21.6* 09/04/2015   MCV 80.0 09/04/2015   PLT 33* 09/04/2015      Chemistry      Component Value Date/Time   NA 139 06/03/2015 0936   NA 138 05/30/2014 1138   K 4.4 06/03/2015 0936   K 3.7 05/30/2014 1138   CL 104 06/03/2015 0936   CL 102 04/09/2013 1347   CO2 26 06/03/2015 0936   CO2  24 05/30/2014 1138   BUN 33* 06/03/2015 0936   BUN 26.2* 05/30/2014 1138   CREATININE 1.03 06/03/2015 0936   CREATININE 0.8 05/30/2014 1138      Component Value Date/Time   CALCIUM 9.2 06/03/2015 0936   CALCIUM 9.8 05/30/2014 1138   ALKPHOS 65 06/03/2015 0936   ALKPHOS 48 11/22/2013 0847   AST 22 06/03/2015 0936   AST 15 11/22/2013 0847   ALT 18 06/03/2015 0936   ALT 15 11/22/2013 0847   BILITOT 1.2 06/03/2015 0936   BILITOT 1.15 11/22/2013 0847     ASSESSMENT & PLAN:  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 The patient is not a candidate for clinical trial. We discussed treatment options which are limited. The patient wants to preserve quality of life. The hematologist for [facility] suggested a trial of Jakafi but after much review of chart, it was noted that the patient had low platelet counts. The drug is not approved for platelet count less than 50,000. At the time of discussion with the patient, he is in agreement to just receive palliative supportive care/transfusion  support. In the meantime, we will continue blood transfusion support every 2 weeks to keep hemoglobin above 8 g.  Anemia in neoplastic disease We discussed some of the risks, benefits, and alternatives of blood transfusions. The patient is symptomatic from anemia and the hemoglobin level is critically low.  Some of the side-effects to be expected including risks of transfusion reactions, chills, infection, syndrome of volume overload and risk of hospitalization from various reasons and the patient is willing to proceed and went ahead to sign consent today. The patient will get 2 units of blood whenever hemoglobin dropped to less than 8.    Thrombocytopenia He has significant drop off his platelet count due to splenomegaly I suspect it could be a progression of his bone marrow disease. In the meantime, I told him to hold off taking aspirin therapy until his platelet count rebound to greater than 50,000. About from bruising, he is not symptomatic with bleeding. He does not require platelet transfusion today.      Rheumatoid arthritis We'll continue low-dose prednisone for now  Fever and chills He has recurrent fevers and chills of unknown etiology. There were no localizing signs. It is likely could be related to tumor fever. The patient is at risk of infection due to nonfunctioning bone marrow. They gave him antibiotics to hang onto. He had symptomatic improvement after recent antibiotics therapy. He took Augmentin recently. I will switch him to levofloxacin to take the next time he spiked high-grade fever in the future.  Palliative care encounter We have long discussion about palliative care and end-of-life care. The patient is aware that he is not doing well. There is no clinical trial available and I would not recommending systemic chemotherapy. After much discussion, we are in agreement for supportive transfusion only. We discussed last directive and living will. He will think  about getting all his paperwork in place. For now, he wants supportive therapy. I recommend the patient to make sure that his CODE STATUS is DO NOT RESUSCITATE He had problems sleeping at nighttime. I recommend a trial of mirtazapine.   No orders of the defined types were placed in this encounter.   All questions were answered. The patient knows to call the clinic with any problems, questions or concerns. No barriers to  learning was detected. I spent 30 minutes counseling the patient face to face. The total time spent in the appointment was 40 minutes and more than 50% was on counseling and review of test results     [facility], [place], MD 09/04/2015 3:41 PM

## 2015-09-05 ENCOUNTER — Ambulatory Visit (HOSPITAL_BASED_OUTPATIENT_CLINIC_OR_DEPARTMENT_OTHER): Payer: Medicare Other

## 2015-09-05 ENCOUNTER — Encounter: Payer: Self-pay | Admitting: *Deleted

## 2015-09-05 ENCOUNTER — Other Ambulatory Visit: Payer: Medicare Other

## 2015-09-05 VITALS — BP 101/45 | HR 58 | Temp 98.1°F | Resp 18

## 2015-09-05 DIAGNOSIS — D469 Myelodysplastic syndrome, unspecified: Secondary | ICD-10-CM | POA: Diagnosis present

## 2015-09-05 LAB — PREPARE RBC (CROSSMATCH)

## 2015-09-05 MED ORDER — SODIUM CHLORIDE 0.9 % IV SOLN
250.0000 mL | Freq: Once | INTRAVENOUS | Status: AC
  Administered 2015-09-05: 250 mL via INTRAVENOUS

## 2015-09-05 MED ORDER — DIPHENHYDRAMINE HCL 25 MG PO CAPS
ORAL_CAPSULE | ORAL | Status: AC
  Filled 2015-09-05: qty 1

## 2015-09-05 MED ORDER — ACETAMINOPHEN 325 MG PO TABS
650.0000 mg | ORAL_TABLET | Freq: Once | ORAL | Status: AC
  Administered 2015-09-05: 650 mg via ORAL

## 2015-09-05 MED ORDER — ACETAMINOPHEN 325 MG PO TABS
ORAL_TABLET | ORAL | Status: AC
  Filled 2015-09-05: qty 2

## 2015-09-05 MED ORDER — DIPHENHYDRAMINE HCL 25 MG PO CAPS
25.0000 mg | ORAL_CAPSULE | Freq: Once | ORAL | Status: AC
  Administered 2015-09-05: 25 mg via ORAL

## 2015-09-05 NOTE — Patient Instructions (Signed)

## 2015-09-07 LAB — TYPE AND SCREEN
ABO/RH(D): O POS
Antibody Screen: NEGATIVE
UNIT DIVISION: 0
Unit division: 0

## 2015-09-08 ENCOUNTER — Ambulatory Visit (HOSPITAL_COMMUNITY)
Admission: RE | Admit: 2015-09-08 | Discharge: 2015-09-08 | Disposition: A | Discharge status: Home / Self Care | Payer: Medicare Other | Source: Ambulatory Visit | Attending: Hematology and Oncology | Admitting: Hematology and Oncology

## 2015-09-08 ENCOUNTER — Telehealth: Payer: Self-pay | Admitting: *Deleted

## 2015-09-08 DIAGNOSIS — D469 Myelodysplastic syndrome, unspecified: Secondary | ICD-10-CM | POA: Insufficient documentation

## 2015-09-08 NOTE — Telephone Encounter (Signed)
Pt called requesting a copy of his medical records, no CDs needed. Please call when ready.

## 2015-09-11 ENCOUNTER — Other Ambulatory Visit: Payer: Medicare Other

## 2015-09-11 ENCOUNTER — Ambulatory Visit: Payer: Medicare Other

## 2015-09-16 ENCOUNTER — Other Ambulatory Visit: Payer: Self-pay

## 2015-09-16 MED ORDER — METOPROLOL TARTRATE 50 MG PO TABS: ORAL_TABLET | ORAL | Status: DC

## 2015-09-16 NOTE — Telephone Encounter (Signed)
Eric Booze, MD at 04/21/2015 8:30 AM  metoprolol (LOPRESSOR) 50 MG tablettake 1/2 tablet by mouth twice a day Current medicines are reviewed at length with the patient today. The patient concerns regarding his medicines were addressed. The following changes have been made: Stopping amiodarone

## 2015-09-19 ENCOUNTER — Other Ambulatory Visit (HOSPITAL_BASED_OUTPATIENT_CLINIC_OR_DEPARTMENT_OTHER): Payer: Medicare Other

## 2015-09-19 ENCOUNTER — Ambulatory Visit (HOSPITAL_BASED_OUTPATIENT_CLINIC_OR_DEPARTMENT_OTHER): Payer: Medicare Other

## 2015-09-19 ENCOUNTER — Other Ambulatory Visit: Payer: Self-pay | Admitting: Hematology and Oncology

## 2015-09-19 VITALS — BP 105/42 | HR 60 | Temp 98.2°F | Resp 18

## 2015-09-19 DIAGNOSIS — D630 Anemia in neoplastic disease: Secondary | ICD-10-CM

## 2015-09-19 DIAGNOSIS — D469 Myelodysplastic syndrome, unspecified: Secondary | ICD-10-CM | POA: Diagnosis present

## 2015-09-19 DIAGNOSIS — D4620 Refractory anemia with excess of blasts, unspecified: Secondary | ICD-10-CM

## 2015-09-19 LAB — CBC & DIFF AND RETIC
BASO%: 1 % (ref 0.0–2.0)
BASOS ABS: 0.1 10*3/uL (ref 0.0–0.1)
EOS%: 0 % (ref 0.0–7.0)
Eosinophils Absolute: 0 10*3/uL (ref 0.0–0.5)
HEMATOCRIT: 22.5 % — AB (ref 38.4–49.9)
HGB: 7.3 g/dL — ABNORMAL LOW (ref 13.0–17.1)
Immature Retic Fract: 22.2 % — ABNORMAL HIGH (ref 3.00–10.60)
LYMPH#: 1.3 10*3/uL (ref 0.9–3.3)
LYMPH%: 22.8 % (ref 14.0–49.0)
MCH: 26.1 pg — ABNORMAL LOW (ref 27.2–33.4)
MCHC: 32.4 g/dL (ref 32.0–36.0)
MCV: 80.4 fL (ref 79.3–98.0)
MONO#: 0.8 10*3/uL (ref 0.1–0.9)
MONO%: 13.4 % (ref 0.0–14.0)
NEUT#: 3.6 10*3/uL (ref 1.5–6.5)
NEUT%: 62.8 % (ref 39.0–75.0)
RBC: 2.8 10*6/uL — AB (ref 4.20–5.82)
RDW: 17 % — ABNORMAL HIGH (ref 11.0–14.6)
RETIC CT ABS: 40.32 10*3/uL (ref 34.80–93.90)
Retic %: 1.44 % (ref 0.80–1.80)
WBC: 5.8 10*3/uL (ref 4.0–10.3)

## 2015-09-19 LAB — PREPARE RBC (CROSSMATCH)

## 2015-09-19 LAB — HOLD TUBE, BLOOD BANK

## 2015-09-19 LAB — TECHNOLOGIST REVIEW

## 2015-09-19 MED ORDER — ACETAMINOPHEN 325 MG PO TABS
ORAL_TABLET | ORAL | Status: AC
  Filled 2015-09-19: qty 2

## 2015-09-19 MED ORDER — ACETAMINOPHEN 325 MG PO TABS
650.0000 mg | ORAL_TABLET | Freq: Once | ORAL | Status: AC
  Administered 2015-09-19: 650 mg via ORAL

## 2015-09-19 MED ORDER — DIPHENHYDRAMINE HCL 25 MG PO CAPS
25.0000 mg | ORAL_CAPSULE | Freq: Once | ORAL | Status: AC
  Administered 2015-09-19: 25 mg via ORAL

## 2015-09-19 MED ORDER — SODIUM CHLORIDE 0.9 % IV SOLN
250.0000 mL | Freq: Once | INTRAVENOUS | Status: AC
  Administered 2015-09-19: 250 mL via INTRAVENOUS

## 2015-09-19 MED ORDER — DIPHENHYDRAMINE HCL 25 MG PO CAPS
ORAL_CAPSULE | ORAL | Status: AC
  Filled 2015-09-19: qty 1

## 2015-09-19 NOTE — Patient Instructions (Signed)

## 2015-09-20 LAB — TYPE AND SCREEN
ABO/RH(D): O POS
ANTIBODY SCREEN: NEGATIVE
UNIT DIVISION: 0
UNIT DIVISION: 0

## 2015-09-25 ENCOUNTER — Other Ambulatory Visit: Payer: Medicare Other

## 2015-09-25 ENCOUNTER — Ambulatory Visit: Payer: Medicare Other

## 2015-09-25 ENCOUNTER — Ambulatory Visit: Payer: Medicare Other | Admitting: Hematology and Oncology

## 2015-10-03 ENCOUNTER — Ambulatory Visit (HOSPITAL_BASED_OUTPATIENT_CLINIC_OR_DEPARTMENT_OTHER): Payer: Medicare Other

## 2015-10-03 ENCOUNTER — Other Ambulatory Visit (HOSPITAL_BASED_OUTPATIENT_CLINIC_OR_DEPARTMENT_OTHER): Payer: Medicare Other

## 2015-10-03 VITALS — BP 106/41 | HR 69 | Temp 97.9°F | Resp 18

## 2015-10-03 DIAGNOSIS — D4620 Refractory anemia with excess of blasts, unspecified: Secondary | ICD-10-CM

## 2015-10-03 DIAGNOSIS — D469 Myelodysplastic syndrome, unspecified: Secondary | ICD-10-CM | POA: Diagnosis not present

## 2015-10-03 LAB — TECHNOLOGIST REVIEW

## 2015-10-03 LAB — CBC & DIFF AND RETIC
BASO%: 0.8 % (ref 0.0–2.0)
Basophils Absolute: 0.1 10*3/uL (ref 0.0–0.1)
EOS ABS: 0 10*3/uL (ref 0.0–0.5)
EOS%: 0 % (ref 0.0–7.0)
HCT: 22.8 % — ABNORMAL LOW (ref 38.4–49.9)
HGB: 7.5 g/dL — ABNORMAL LOW (ref 13.0–17.1)
IMMATURE RETIC FRACT: 11.4 % — AB (ref 3.00–10.60)
LYMPH%: 21 % (ref 14.0–49.0)
MCH: 26 pg — ABNORMAL LOW (ref 27.2–33.4)
MCHC: 32.9 g/dL (ref 32.0–36.0)
MCV: 78.9 fL — ABNORMAL LOW (ref 79.3–98.0)
MONO#: 0.8 10*3/uL (ref 0.1–0.9)
MONO%: 12.6 % (ref 0.0–14.0)
NEUT%: 65.6 % (ref 39.0–75.0)
NEUTROS ABS: 4.2 10*3/uL (ref 1.5–6.5)
PLATELETS: 36 10*3/uL — AB (ref 140–400)
RBC: 2.89 10*6/uL — AB (ref 4.20–5.82)
RDW: 17.3 % — ABNORMAL HIGH (ref 11.0–14.6)
RETIC CT ABS: 44.8 10*3/uL (ref 34.80–93.90)
Retic %: 1.55 % (ref 0.80–1.80)
WBC: 6.4 10*3/uL (ref 4.0–10.3)
lymph#: 1.3 10*3/uL (ref 0.9–3.3)
nRBC: 1 % — ABNORMAL HIGH (ref 0–0)

## 2015-10-03 LAB — PREPARE RBC (CROSSMATCH)

## 2015-10-03 LAB — HOLD TUBE, BLOOD BANK

## 2015-10-03 MED ORDER — ACETAMINOPHEN 325 MG PO TABS
650.0000 mg | ORAL_TABLET | Freq: Once | ORAL | Status: AC
  Administered 2015-10-03: 650 mg via ORAL

## 2015-10-03 MED ORDER — SODIUM CHLORIDE 0.9 % IV SOLN
250.0000 mL | Freq: Once | INTRAVENOUS | Status: AC
  Administered 2015-10-03: 250 mL via INTRAVENOUS

## 2015-10-03 MED ORDER — DIPHENHYDRAMINE HCL 25 MG PO CAPS
ORAL_CAPSULE | ORAL | Status: AC
  Filled 2015-10-03: qty 1

## 2015-10-03 MED ORDER — DIPHENHYDRAMINE HCL 25 MG PO CAPS
25.0000 mg | ORAL_CAPSULE | Freq: Once | ORAL | Status: AC
  Administered 2015-10-03: 25 mg via ORAL

## 2015-10-03 MED ORDER — ACETAMINOPHEN 325 MG PO TABS
ORAL_TABLET | ORAL | Status: AC
Start: 2015-10-03 — End: 2015-10-03
  Filled 2015-10-03: qty 2

## 2015-10-03 NOTE — Patient Instructions (Signed)

## 2015-10-06 LAB — TYPE AND SCREEN
ABO/RH(D): O POS
Antibody Screen: NEGATIVE
UNIT DIVISION: 0
Unit division: 0

## 2015-10-07 ENCOUNTER — Ambulatory Visit (HOSPITAL_COMMUNITY)
Admission: RE | Admit: 2015-10-07 | Discharge: 2015-10-07 | Disposition: A | Discharge status: Home / Self Care | Payer: Medicare Other | Source: Ambulatory Visit | Attending: Hematology and Oncology | Admitting: Hematology and Oncology

## 2015-10-07 DIAGNOSIS — Z0189 Encounter for other specified special examinations: Secondary | ICD-10-CM | POA: Insufficient documentation

## 2015-10-07 DIAGNOSIS — D469 Myelodysplastic syndrome, unspecified: Secondary | ICD-10-CM

## 2015-10-17 ENCOUNTER — Other Ambulatory Visit: Payer: Self-pay | Admitting: Hematology and Oncology

## 2015-10-17 ENCOUNTER — Ambulatory Visit (HOSPITAL_BASED_OUTPATIENT_CLINIC_OR_DEPARTMENT_OTHER): Payer: Medicare Other

## 2015-10-17 ENCOUNTER — Ambulatory Visit (HOSPITAL_BASED_OUTPATIENT_CLINIC_OR_DEPARTMENT_OTHER): Payer: Medicare Other | Admitting: Hematology and Oncology

## 2015-10-17 ENCOUNTER — Telehealth: Payer: Self-pay | Admitting: Hematology and Oncology

## 2015-10-17 ENCOUNTER — Other Ambulatory Visit (HOSPITAL_BASED_OUTPATIENT_CLINIC_OR_DEPARTMENT_OTHER): Payer: Medicare Other

## 2015-10-17 VITALS — BP 106/53 | HR 67 | Temp 98.5°F | Resp 18

## 2015-10-17 VITALS — BP 125/57 | HR 94 | Temp 97.4°F | Resp 20

## 2015-10-17 DIAGNOSIS — D471 Chronic myeloproliferative disease: Secondary | ICD-10-CM

## 2015-10-17 DIAGNOSIS — D469 Myelodysplastic syndrome, unspecified: Secondary | ICD-10-CM

## 2015-10-17 DIAGNOSIS — D7581 Myelofibrosis: Secondary | ICD-10-CM | POA: Diagnosis present

## 2015-10-17 DIAGNOSIS — R6883 Chills (without fever): Secondary | ICD-10-CM

## 2015-10-17 DIAGNOSIS — D61818 Other pancytopenia: Secondary | ICD-10-CM | POA: Diagnosis not present

## 2015-10-17 DIAGNOSIS — D4620 Refractory anemia with excess of blasts, unspecified: Secondary | ICD-10-CM | POA: Diagnosis present

## 2015-10-17 DIAGNOSIS — Z515 Encounter for palliative care: Secondary | ICD-10-CM

## 2015-10-17 DIAGNOSIS — D696 Thrombocytopenia, unspecified: Secondary | ICD-10-CM

## 2015-10-17 DIAGNOSIS — Z0189 Encounter for other specified special examinations: Secondary | ICD-10-CM | POA: Diagnosis present

## 2015-10-17 LAB — CBC & DIFF AND RETIC
BASO%: 1.2 % (ref 0.0–2.0)
BASOS ABS: 0.1 10*3/uL (ref 0.0–0.1)
EOS ABS: 0 10*3/uL (ref 0.0–0.5)
EOS%: 0 % (ref 0.0–7.0)
HCT: 23.9 % — ABNORMAL LOW (ref 38.4–49.9)
HEMOGLOBIN: 8 g/dL — AB (ref 13.0–17.1)
IMMATURE RETIC FRACT: 16.3 % — AB (ref 3.00–10.60)
LYMPH#: 1.9 10*3/uL (ref 0.9–3.3)
LYMPH%: 22.9 % (ref 14.0–49.0)
MCH: 26.2 pg — ABNORMAL LOW (ref 27.2–33.4)
MCHC: 33.5 g/dL (ref 32.0–36.0)
MCV: 78.4 fL — AB (ref 79.3–98.0)
MONO#: 1.3 10*3/uL — AB (ref 0.1–0.9)
MONO%: 15.7 % — ABNORMAL HIGH (ref 0.0–14.0)
NEUT%: 60.2 % (ref 39.0–75.0)
NEUTROS ABS: 4.9 10*3/uL (ref 1.5–6.5)
NRBC: 1 % — AB (ref 0–0)
Platelets: 38 10*3/uL — ABNORMAL LOW (ref 140–400)
RBC: 3.05 10*6/uL — AB (ref 4.20–5.82)
RDW: 17.4 % — AB (ref 11.0–14.6)
RETIC %: 1.28 % (ref 0.80–1.80)
RETIC CT ABS: 39.04 10*3/uL (ref 34.80–93.90)
WBC: 8.2 10*3/uL (ref 4.0–10.3)

## 2015-10-17 LAB — TECHNOLOGIST REVIEW

## 2015-10-17 LAB — PREPARE RBC (CROSSMATCH)

## 2015-10-17 LAB — HOLD TUBE, BLOOD BANK

## 2015-10-17 MED ORDER — DIPHENHYDRAMINE HCL 25 MG PO CAPS
25.0000 mg | ORAL_CAPSULE | Freq: Once | ORAL | Status: AC
  Administered 2015-10-17: 25 mg via ORAL

## 2015-10-17 MED ORDER — ACETAMINOPHEN 325 MG PO TABS
650.0000 mg | ORAL_TABLET | Freq: Once | ORAL | Status: AC
  Administered 2015-10-17: 650 mg via ORAL

## 2015-10-17 MED ORDER — DIPHENHYDRAMINE HCL 25 MG PO CAPS
ORAL_CAPSULE | ORAL | Status: AC
  Filled 2015-10-17: qty 1

## 2015-10-17 MED ORDER — SODIUM CHLORIDE 0.9 % IV SOLN
250.0000 mL | Freq: Once | INTRAVENOUS | Status: AC
  Administered 2015-10-17: 250 mL via INTRAVENOUS

## 2015-10-17 MED ORDER — ACETAMINOPHEN 325 MG PO TABS
ORAL_TABLET | ORAL | Status: AC
  Filled 2015-10-17: qty 2

## 2015-10-17 NOTE — Assessment & Plan Note (Signed)
We have long discussion about palliative care and end-of-life care. We discussed advanced directive and living will. He will think about getting all his paperwork in place. For now, he wants supportive therapy. I recommend the patient to make sure that his CODE STATUS is DO NOT RESUSCITATE

## 2015-10-17 NOTE — Assessment & Plan Note (Signed)
We discussed some of the risks, benefits, and alternatives of blood transfusions. The patient is symptomatic from anemia and the hemoglobin level is critically low.  Some of the side-effects to be expected including risks of transfusion reactions, chills, infection, syndrome of volume overload and risk of hospitalization from various reasons and the patient is willing to proceed and went ahead to sign consent today. The patient will get 2 units of blood whenever hemoglobin dropped to less than 8.   

## 2015-10-17 NOTE — Patient Instructions (Signed)

## 2015-10-17 NOTE — Telephone Encounter (Signed)
Gave pt's wife appts for 10/31/15 - 12/26/15.

## 2015-10-17 NOTE — Assessment & Plan Note (Signed)
He has significant drop of his platelet count due to splenomegaly I suspect it could be a progression of his bone marrow disease. In the meantime, I told him to hold off taking aspirin therapy until his platelet count rebound to greater than 50,000. About from bruising, he is not symptomatic with bleeding. He does not require platelet transfusion today.

## 2015-10-17 NOTE — Assessment & Plan Note (Signed)
He has one episode of chills this morning but no signs of infection. Monitor closely.

## 2015-10-17 NOTE — Assessment & Plan Note (Signed)
The patient is not a candidate for clinical trial. We discussed treatment options which are limited. The patient wants to preserve quality of life. At the time of discussion with the patient, he is in agreement to just receive palliative supportive care/transfusion support. In the meantime, we will continue blood transfusion support every 2 weeks to keep hemoglobin above 8 g.

## 2015-10-17 NOTE — Progress Notes (Signed)
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 He is seen for further follow-up. His energy level is fair. He has modified his diet recently and felt a bit better. He one episode of chills this morning but no fever. No recent cough. Denies abdominal pain.  REVIEW OF SYSTEMS:   Constitutional: Denies fevers, or abnormal weight loss Eyes: Denies blurriness of vision Ears, nose, mouth, throat, and face: Denies mucositis or sore throat Respiratory: Denies cough, dyspnea or wheezes Cardiovascular: Denies palpitation, chest discomfort or lower extremity swelling Gastrointestinal:  Denies nausea, heartburn or change in bowel habits Skin: Denies abnormal skin rashes Lymphatics: Denies new lymphadenopathy or easy bruising Neurological:Denies numbness, tingling or new weaknesses Behavioral/Psych: Mood is stable, no new changes  All other systems were reviewed with the patient and are negative.  I have reviewed the past medical history, past surgical history, social history and family history with the patient and they are unchanged from previous  note.  ALLERGIES:  has No Known Allergies.  MEDICATIONS:  Current Outpatient Prescriptions  Medication Sig Dispense Refill  . acetaminophen (TYLENOL) 500 MG tablet Take 1,000 mg by mouth every 6 (six) hours as needed for pain.    Marland Kitchen alendronate (FOSAMAX) 70 MG tablet Take 70 mg by mouth once a week. On Friday. Take with a full glass of water on an empty stomach.    Marland Kitchen allopurinol (ZYLOPRIM) 100 MG tablet Take 100 mg by mouth daily.    Marland Kitchen ALPRAZolam (XANAX) 0.25 MG tablet Take 0.125 mg by mouth 2 (two) times daily as needed for anxiety.   0  . atorvastatin (LIPITOR) 20 MG tablet Take 1 tablet (20 mg total) by mouth daily. 90 tablet 3  . Calcium-Vitamin D (CALTRATE 600 PLUS-VIT D PO) Take 1 tablet by mouth 2 (two) times daily.     . Darbepoetin Alfa (ARANESP) 500 MCG/ML SOSY injection Inject 500 mcg into the skin every 14 (fourteen) days.     Marland Kitchen levofloxacin (LEVAQUIN) 500 MG tablet Take 1 tablet (500 mg total) by mouth daily. 14 tablet 0  . lisinopril-hydrochlorothiazide (PRINZIDE,ZESTORETIC) 10-12.5 MG per tablet Take 0.5 tablets by mouth daily. 45 tablet 2  . loratadine (CLARITIN) 10 MG tablet Take 10 mg by mouth daily.    . metoprolol (LOPRESSOR) 50 MG tablet take 1/2 tablet by mouth twice a day 30 tablet 1  . mirtazapine (REMERON) 15 MG tablet Take 1 tablet (15 mg total) by mouth at bedtime. 30 tablet 6  . MULTIPLE VITAMINS-MINERALS PO Take 1 tablet by mouth 2 (two) times daily.    Marland Kitchen NITROSTAT 0.4 MG SL tablet Place 0.4 mg under the tongue every 5 (five) minutes as needed.   0  . Omega-3 Fatty Acids (FISH OIL)  1000 MG CAPS Take 2,000 mg by mouth 2 (two) times daily.    . ondansetron (ZOFRAN ODT) 4 MG disintegrating tablet 4mg  ODT q4 hours prn nausea/vomit 15 tablet 0  . predniSONE (DELTASONE) 5 MG tablet Take 7.5 mg by mouth daily.     . traMADol (ULTRAM) 50 MG tablet Take 25 mg by mouth every 4 (four) hours as needed for pain.      No current facility-administered medications for this  visit.    PHYSICAL EXAMINATION: ECOG PERFORMANCE STATUS: 1 - Symptomatic but completely ambulatory  Filed Vitals:   10/17/15 1017  BP: 125/57  Pulse: 94  Temp: 97.4 F (36.3 C)  Resp: 20   There were no vitals filed for this visit.  GENERAL:alert, no distress and comfortable SKIN: skin color, texture, turgor are normal, no rashes or significant lesions EYES: normal, Conjunctiva are pink and non-injected, sclera clear OROPHARYNX:no exudate, no erythema and lips, buccal mucosa, and tongue normal  Musculoskeletal:no cyanosis of digits and no clubbing  NEURO: alert & oriented x 3 with fluent speech, no focal motor/sensory deficits  LABORATORY DATA:  I have reviewed the data as listed    Component Value Date/Time   NA 139 06/03/2015 0936   NA 138 05/30/2014 1138   K 4.4 06/03/2015 0936   K 3.7 05/30/2014 1138   CL 104 06/03/2015 0936   CL 102 04/09/2013 1347   CO2 26 06/03/2015 0936   CO2 24 05/30/2014 1138   GLUCOSE 97 06/03/2015 0936   GLUCOSE 147* 05/30/2014 1138   GLUCOSE 113* 04/09/2013 1347   BUN 33* 06/03/2015 0936   BUN 26.2* 05/30/2014 1138   CREATININE 1.03 06/03/2015 0936   CREATININE 0.8 05/30/2014 1138   CALCIUM 9.2 06/03/2015 0936   CALCIUM 9.8 05/30/2014 1138   PROT 7.3 06/03/2015 0936   PROT 7.4 11/22/2013 0847   ALBUMIN 3.6 06/03/2015 0936   ALBUMIN 4.1 11/22/2013 0847   AST 22 06/03/2015 0936   AST 15 11/22/2013 0847   ALT 18 06/03/2015 0936   ALT 15 11/22/2013 0847   ALKPHOS 65 06/03/2015 0936   ALKPHOS 48 11/22/2013 0847   BILITOT 1.2 06/03/2015 0936   BILITOT 1.15 11/22/2013 0847   GFRNONAA >60 06/03/2015 0936   GFRAA >60 06/03/2015 0936    No results found for: SPEP, UPEP  Lab Results  Component Value Date   WBC 8.2 10/17/2015   NEUTROABS 4.9 10/17/2015   HGB 8.0* 10/17/2015   HCT 23.9* 10/17/2015   MCV 78.4* 10/17/2015   PLT 38* 10/17/2015      Chemistry      Component Value Date/Time   NA 139 06/03/2015 0936   NA 138  05/30/2014 1138   K 4.4 06/03/2015 0936   K 3.7 05/30/2014 1138   CL 104 06/03/2015 0936   CL 102 04/09/2013 1347   CO2 26 06/03/2015 0936   CO2 24 05/30/2014 1138   BUN 33* 06/03/2015 0936   BUN 26.2* 05/30/2014 1138   CREATININE 1.03 06/03/2015 0936   CREATININE 0.8 05/30/2014 1138      Component Value Date/Time   CALCIUM 9.2 06/03/2015 0936   CALCIUM 9.8 05/30/2014 1138   ALKPHOS 65 06/03/2015 0936   ALKPHOS 48 11/22/2013 0847   AST 22 06/03/2015 0936   AST 15 11/22/2013 0847   ALT 18 06/03/2015 0936   ALT 15 11/22/2013 0847   BILITOT 1.2 06/03/2015 0936   BILITOT 1.15 11/22/2013 0847     ASSESSMENT & PLAN:  Primary  myelofibrosis The patient is not a candidate for clinical trial. We discussed treatment options which are limited. The patient wants to preserve quality of life. At the time of discussion with the patient, he is in agreement to just receive palliative supportive care/transfusion support. In the meantime, we will continue blood transfusion support every 2 weeks to keep hemoglobin above 8 g.    Pancytopenia, acquired ([place]) We discussed some of the risks, benefits, and alternatives of blood transfusions. The patient is symptomatic from anemia and the hemoglobin level is critically low.  Some of the side-effects to be expected including risks of transfusion reactions, chills, infection, syndrome of volume overload and risk of hospitalization from various reasons and the patient is willing to proceed and went ahead to sign consent today. The patient will get 2 units of blood whenever hemoglobin dropped to less than 8.    Thrombocytopenia He has significant drop of his platelet count due to splenomegaly I suspect it could be a progression of his bone marrow disease. In the meantime, I told him to hold off taking aspirin therapy until his platelet count rebound to greater than 50,000. About from bruising, he is not symptomatic with bleeding. He does not require  platelet transfusion today.    Chills (without fever) He has one episode of chills this morning but no signs of infection. Monitor closely.  Palliative care encounter We have long discussion about palliative care and end-of-life care. We discussed advanced directive and living will. He will think about getting all his paperwork in place. For now, he wants supportive therapy. I recommend the patient to make sure that his CODE STATUS is DO NOT RESUSCITATE   No orders of the defined types were placed in this encounter.   All questions were answered. The patient knows to call the clinic with any problems, questions or concerns. No barriers to learning was detected. I spent 20 minutes counseling the patient face to face. The total time spent in the appointment was 25 minutes and more than 50% was on counseling and review of test results     [facility], [place], MD 10/17/2015 4:23 PM

## 2015-10-19 LAB — TYPE AND SCREEN
ABO/RH(D): O POS
Antibody Screen: NEGATIVE
UNIT DIVISION: 0
UNIT DIVISION: 0

## 2015-10-31 ENCOUNTER — Ambulatory Visit (HOSPITAL_BASED_OUTPATIENT_CLINIC_OR_DEPARTMENT_OTHER): Payer: Medicare Other

## 2015-10-31 ENCOUNTER — Other Ambulatory Visit (HOSPITAL_BASED_OUTPATIENT_CLINIC_OR_DEPARTMENT_OTHER): Payer: Medicare Other

## 2015-10-31 VITALS — BP 101/42 | HR 59 | Temp 97.8°F | Resp 18

## 2015-10-31 DIAGNOSIS — D4620 Refractory anemia with excess of blasts, unspecified: Secondary | ICD-10-CM

## 2015-10-31 DIAGNOSIS — D469 Myelodysplastic syndrome, unspecified: Secondary | ICD-10-CM

## 2015-10-31 LAB — CBC & DIFF AND RETIC
BASO%: 0.8 % (ref 0.0–2.0)
Basophils Absolute: 0 10*3/uL (ref 0.0–0.1)
EOS%: 0 % (ref 0.0–7.0)
Eosinophils Absolute: 0 10*3/uL (ref 0.0–0.5)
HCT: 22 % — ABNORMAL LOW (ref 38.4–49.9)
HGB: 7.4 g/dL — ABNORMAL LOW (ref 13.0–17.1)
Immature Retic Fract: 10.6 % (ref 3.00–10.60)
LYMPH#: 1 10*3/uL (ref 0.9–3.3)
LYMPH%: 21.6 % (ref 14.0–49.0)
MCH: 27.8 pg (ref 27.2–33.4)
MCHC: 33.6 g/dL (ref 32.0–36.0)
MCV: 82.7 fL (ref 79.3–98.0)
MONO#: 0.8 10*3/uL (ref 0.1–0.9)
MONO%: 16.5 % — ABNORMAL HIGH (ref 0.0–14.0)
NEUT%: 61.1 % (ref 39.0–75.0)
NEUTROS ABS: 2.9 10*3/uL (ref 1.5–6.5)
RBC: 2.66 10*6/uL — AB (ref 4.20–5.82)
RDW: 17.5 % — AB (ref 11.0–14.6)
RETIC %: 0.88 % (ref 0.80–1.80)
RETIC CT ABS: 23.41 10*3/uL — AB (ref 34.80–93.90)
WBC: 4.7 10*3/uL (ref 4.0–10.3)

## 2015-10-31 LAB — TECHNOLOGIST REVIEW

## 2015-10-31 LAB — HOLD TUBE, BLOOD BANK

## 2015-10-31 LAB — PREPARE RBC (CROSSMATCH)

## 2015-10-31 MED ORDER — DIPHENHYDRAMINE HCL 25 MG PO CAPS
ORAL_CAPSULE | ORAL | Status: AC
  Filled 2015-10-31: qty 1

## 2015-10-31 MED ORDER — SODIUM CHLORIDE 0.9 % IV SOLN
250.0000 mL | Freq: Once | INTRAVENOUS | Status: AC
  Administered 2015-10-31: 250 mL via INTRAVENOUS

## 2015-10-31 MED ORDER — DIPHENHYDRAMINE HCL 25 MG PO CAPS
25.0000 mg | ORAL_CAPSULE | Freq: Once | ORAL | Status: AC
  Administered 2015-10-31: 25 mg via ORAL

## 2015-10-31 MED ORDER — ACETAMINOPHEN 325 MG PO TABS
650.0000 mg | ORAL_TABLET | Freq: Once | ORAL | Status: AC
  Administered 2015-10-31: 650 mg via ORAL

## 2015-10-31 MED ORDER — ACETAMINOPHEN 325 MG PO TABS
ORAL_TABLET | ORAL | Status: AC
  Filled 2015-10-31: qty 2

## 2015-10-31 NOTE — Patient Instructions (Signed)
Blood Transfusion   A blood transfusion is a procedure that gives you donated blood through an IV tube. You may need blood because of illness, surgery, or injury. The blood may come from a donor. The blood may also be your own blood that you donated earlier.  The blood you get is made up of different types of cells. You may get:    Red blood cells. These carry oxygen and replace lost blood.    Platelets. These control bleeding.    Plasma. This helps blood to clot.  If you have a clotting disorder, you may also get other types of blood products.   BEFORE THE PROCEDURE   You may have a blood test. This finds out what type of blood you have. It also finds out what kind of blood your body will accept.    If you are going to have a planned surgery, you may donate your own blood. This is done in case you need to have a transfusion.    If you have had an allergic transfusion reaction before, you may be given medicine to help prevent a reaction. Take this medicine only as told by your doctor.   You will have your temperature, blood pressure, and pulse checked.  PROCEDURE    An IV will be started in your hand or arm.    The bag of donated blood will be attached to your IV and run into your vein.    A doctor will regularly check your temperature, blood pressure, and pulse during the procedure. This is done to find any early signs of a transfusion reaction.   If you have any signs or symptoms of a reaction, the procedure may be stopped and you may be given medicine.    When the transfusion is over, your IV will be removed.    Pressure may be applied to the IV site for a few minutes.    A bandage (dressing) will be applied.   The procedure may vary among doctors and hospitals.   AFTER THE PROCEDURE   Your blood pressure, temperature, and pulse will be checked regularly.     This information is not intended to replace advice given to you by your health care provider. Make sure you discuss any questions  you have with your health care provider.     Document Released: 02/18/2009 Document Revised: 12/13/2014 Document Reviewed: 10/02/2014  Elsevier Interactive Patient Education 2016 [employer]

## 2015-11-01 LAB — TYPE AND SCREEN
ABO/RH(D): O POS
ANTIBODY SCREEN: NEGATIVE
UNIT DIVISION: 0
Unit division: 0

## 2015-11-05 NOTE — Progress Notes (Signed)
Patient ID: Eric [name], male   DOB: Jan 19, 1946, 69 y.o.   MRN: [id number]     Cardiology Office Note   Date:  11/06/2015   ID:  Eric [name], Eric [name] 1946-07-22, MRN [id number]  PCP:  Lilian Coma, MD    No chief complaint on file. f/u CAD   Wt Readings from Last 3 Encounters:  11/06/15 155 lb (70.308 kg)  09/04/15 172 lb 11.2 oz (78.336 kg)  08/22/15 176 lb 1.6 oz (79.878 kg)       History of Present Illness: Eric [name] is a 69 y.o. male  Who has MDS. He had severe CAD diagnosed in 2/16 and had CABG. He had some PAF at the time of the CABG.  He was on amiodarone but stopped this.  He still gets injections for the MDS. Hbg is running low. He had a transfusion of PRBCs. He has had low platelets as well. His ASA was stopped.  He was also found to have gallstones. He thinks he willeventually have a cholecystectomy. He has a little pain eating fatty foods. He saw a Psychologist, sport and exercise.   He has not been walking as much.  He walks about 10 minutes a day.  He feels ok with this.  He wishes he had more stamina.  He feels very cold in colder weather, he thinks because of his blood issues.  He does not lke to use his treadmill.     Past Medical History  Diagnosis Date  . MDS (myelodysplastic syndrome), low grade ([place]) 2012    on observation; MDS FISH panel on 04/26/2013 was normal; cytogenetics were also negative.   . Rheumatoid arthritis(714.0)     was on MTX until MDS dx  . Hypertension   . Anemia   . Family history of ischemic heart disease   . Shortness of breath     exertional  . Dysrhythmia   . Tachycardia 2013    evaluated by Dr Irish Lack @ [facility]  . Gout   . Needs flu shot 08/23/2013  . Headache 03/07/2014  . CAD (coronary artery disease), native coronary artery 01/09/2015    3 v dz, EF 50%    Past Surgical History  Procedure Laterality Date  . Knee arthroscopy Left   . Hernia repair    . Umbilical hernia repair    . Circumcision    . Total knee  arthroplasty Right 02/02/2013    Procedure: TOTAL KNEE ARTHROPLASTY;  Surgeon: Yvette Rack., MD;  Location: [facility];  Service: Orthopedics;  Laterality: Right;  RIGHT ARTHROPLASTY KNEE MEDIAL/LATERAL COMPARTMENTS WITH PATELLA RESURFACING   . Left heart catheterization with coronary angiogram N/A 01/09/2015    Procedure: LEFT HEART CATHETERIZATION WITH CORONARY ANGIOGRAM;  Surgeon: Burnell Blanks, MD;  LAD 99/90%, D1 50%, CFX 40%, OM 70%, OM branch 80%, RCA  50%, dRCA 99%, PDA 50%, EF 50%  . Coronary artery bypass graft N/A 01/13/2015    Procedure: CORONARY ARTERY BYPASS GRAFTING (CABG) times four, using left internal mammary artery and left greater saphenous vein.;  Surgeon: Ivin Poot, MD;  Location: [facility];  Service: Open Heart Surgery;  Laterality: N/A;  . Tee without cardioversion N/A 01/13/2015    Procedure: TRANSESOPHAGEAL ECHOCARDIOGRAM (TEE);  Surgeon: Ivin Poot, MD;  Location: [facility];  Service: Open Heart Surgery;  Laterality: N/A;     Current Outpatient Prescriptions  Medication Sig Dispense Refill  . acetaminophen (TYLENOL) 500 MG tablet Take 1,000 mg by mouth every 6 (six)  hours as needed for pain.    Marland Kitchen allopurinol (ZYLOPRIM) 100 MG tablet Take 100 mg by mouth daily.    Marland Kitchen ALPRAZolam (XANAX) 0.25 MG tablet Take 0.125 mg by mouth 2 (two) times daily as needed for anxiety.   0  . atorvastatin (LIPITOR) 20 MG tablet Take 1 tablet (20 mg total) by mouth daily. 90 tablet 3  . Calcium-Vitamin D (CALTRATE 600 PLUS-VIT D PO) Take 1 tablet by mouth 2 (two) times daily.     Marland Kitchen levofloxacin (LEVAQUIN) 500 MG tablet Take 1 tablet (500 mg total) by mouth daily. 14 tablet 0  . lisinopril-hydrochlorothiazide (PRINZIDE,ZESTORETIC) 10-12.5 MG per tablet Take 0.5 tablets by mouth daily. 45 tablet 2  . loratadine (CLARITIN) 10 MG tablet Take 10 mg by mouth daily.    . metoprolol (LOPRESSOR) 50 MG tablet take 1/2 tablet by mouth twice a day 30 tablet 1  . MULTIPLE VITAMINS-MINERALS PO Take 1  tablet by mouth 2 (two) times daily.    Marland Kitchen NITROSTAT 0.4 MG SL tablet Place 0.4 mg under the tongue every 5 (five) minutes as needed.   0  . Omega-3 Fatty Acids (FISH OIL) 1000 MG CAPS Take 2,000 mg by mouth 2 (two) times daily.    . ondansetron (ZOFRAN ODT) 4 MG disintegrating tablet 4mg  ODT q4 hours prn nausea/vomit 15 tablet 0  . predniSONE (DELTASONE) 5 MG tablet Take 7.5 mg by mouth daily.     . traMADol (ULTRAM) 50 MG tablet Take 25 mg by mouth every 4 (four) hours as needed for pain.      No current facility-administered medications for this visit.    Allergies:   Review of patient's allergies indicates no known allergies.    Social History:  The patient  reports that he quit smoking about 45 years ago. His smoking use included Cigarettes. He has never used smokeless tobacco. He reports that he does not drink alcohol or use illicit drugs.   Family History:  The patient's family history includes Brain cancer in his mother; Breast cancer in his sister; Diabetes in his brother; Heart attack in his brother and father; Heart disease in his brother and father. There is no history of Stroke.    ROS:  Please see the history of present illness.   Otherwise, review of systems are positive for joint pains, intermittent abdominal pain- known gallstones.   All other systems are reviewed and negative.    PHYSICAL EXAM: VS:  BP 96/48 mmHg  Pulse 84  Ht 5\' 8"  (1.727 m)  Wt 155 lb (70.308 kg)  BMI 23.57 kg/m2 , BMI Body mass index is 23.57 kg/(m^2). GEN: Well nourished, well developed, in no acute distress HEENT: normal Neck: no JVD;, bilateralcarotid bruits, or masses Cardiac: RRR; no murmurs, rubs, or gallops,no edema  Respiratory:  clear to auscultation bilaterally, normal work of breathing GI: soft, nontender, nondistended, + BS MS: no deformity or atrophy Skin: warm and dry, no rash Neuro:  Strength and sensation are intact Psych: euthymic mood, full affect      Recent  Labs: 01/14/2015: Magnesium 2.3 06/03/2015: ALT 18; BUN 33*; Creatinine, Ser 1.03; Potassium 4.4; Sodium 139 10/31/2015: HGB 7.4*; Platelets 33 Large platelets present*   Lipid Panel    Component Value Date/Time   CHOL 66 04/21/2015 0908   TRIG 104.0 04/21/2015 0908   HDL 25.00* 04/21/2015 0908   CHOLHDL 3 04/21/2015 0908   VLDL 20.8 04/21/2015 0908   LDLCALC 20 04/21/2015 0908  Other studies Reviewed: Additional studies/ records that were reviewed today with results demonstrating: Normla EF by echo in 2/16..   ASSESSMENT AND PLAN:  1. CAD: No angina.  Now off of aspirin due to platelets of 33K.  Not using NTG.  Antiplatelet therapy will be limited due to MDS issues. To help prolong life of the bypass grafts, aspirin would be beneficial.  However, at this time, bleeding risks seem to outweight the benefits. 2. AFib: Occurred at the time of CABG.  Now off of Amiodarone.  No palpitations.   3. Hyperlipidemia: Atorvastatin decreased from 80 due to very low LDL.  Will check lipids next week at his other blood test appt. 4. Anemia/thrombocytopenia: Plt 33 K. 5. HTN: Controlled.  Continue current meds.   Current medicines are reviewed at length with the patient today.  The patient concerns regarding his medicines were addressed.  The following changes have been made:  No change  Labs/ tests ordered today include:  No orders of the defined types were placed in this encounter.    Recommend 150 minutes/week of aerobic exercise Low fat, low carb, high fiber diet recommended  Disposition:   FU in 1 year   Teresita Madura., MD  11/06/2015 8:38 AM    [facility] [address], [place], [place]  [address] Phone: [phone]; Fax: [phone]

## 2015-11-06 ENCOUNTER — Ambulatory Visit (HOSPITAL_COMMUNITY)
Admission: RE | Admit: 2015-11-06 | Discharge: 2015-11-06 | Disposition: A | Discharge status: Home / Self Care | Payer: Medicare Other | Source: Ambulatory Visit | Attending: Hematology and Oncology | Admitting: Hematology and Oncology

## 2015-11-06 ENCOUNTER — Ambulatory Visit (INDEPENDENT_AMBULATORY_CARE_PROVIDER_SITE_OTHER): Payer: Medicare Other | Admitting: Interventional Cardiology

## 2015-11-06 ENCOUNTER — Encounter: Payer: Self-pay | Admitting: Interventional Cardiology

## 2015-11-06 VITALS — BP 96/48 | HR 84 | Ht 68.0 in | Wt 155.0 lb

## 2015-11-06 DIAGNOSIS — E785 Hyperlipidemia, unspecified: Secondary | ICD-10-CM

## 2015-11-06 DIAGNOSIS — Z951 Presence of aortocoronary bypass graft: Secondary | ICD-10-CM

## 2015-11-06 DIAGNOSIS — I2510 Atherosclerotic heart disease of native coronary artery without angina pectoris: Secondary | ICD-10-CM | POA: Diagnosis not present

## 2015-11-06 DIAGNOSIS — I480 Paroxysmal atrial fibrillation: Secondary | ICD-10-CM

## 2015-11-06 DIAGNOSIS — D696 Thrombocytopenia, unspecified: Secondary | ICD-10-CM

## 2015-11-06 DIAGNOSIS — D469 Myelodysplastic syndrome, unspecified: Secondary | ICD-10-CM

## 2015-11-06 NOTE — Patient Instructions (Signed)
**Note De-Identified Eric [name] Obfuscation** Medication Instructions:  Same-no changes  Labwork: Next week. Please do not eat or drink after midnight the night before labs are drawn.  Testing/Procedures: None  Follow-Up: Your physician wants you to follow-up in: 1year. You will receive a reminder letter in the mail two months in advance. If you don't receive a letter, please call our office to schedule the follow-up appointment.      If you need a refill on your cardiac medications before your next appointment, please call your pharmacy.

## 2015-11-10 ENCOUNTER — Other Ambulatory Visit: Payer: Self-pay | Admitting: *Deleted

## 2015-11-10 MED ORDER — METOPROLOL TARTRATE 50 MG PO TABS: ORAL_TABLET | ORAL | Status: DC

## 2015-11-14 ENCOUNTER — Other Ambulatory Visit: Payer: Self-pay | Admitting: Hematology and Oncology

## 2015-11-14 ENCOUNTER — Ambulatory Visit (HOSPITAL_BASED_OUTPATIENT_CLINIC_OR_DEPARTMENT_OTHER): Payer: Medicare Other

## 2015-11-14 ENCOUNTER — Other Ambulatory Visit (HOSPITAL_BASED_OUTPATIENT_CLINIC_OR_DEPARTMENT_OTHER): Payer: Medicare Other

## 2015-11-14 VITALS — BP 102/47 | HR 62 | Temp 98.3°F | Resp 16

## 2015-11-14 DIAGNOSIS — D4620 Refractory anemia with excess of blasts, unspecified: Secondary | ICD-10-CM

## 2015-11-14 DIAGNOSIS — D469 Myelodysplastic syndrome, unspecified: Secondary | ICD-10-CM | POA: Diagnosis not present

## 2015-11-14 LAB — CBC & DIFF AND RETIC
BASO%: 0.9 % (ref 0.0–2.0)
Basophils Absolute: 0 10*3/uL (ref 0.0–0.1)
EOS%: 0.3 % (ref 0.0–7.0)
Eosinophils Absolute: 0 10*3/uL (ref 0.0–0.5)
HCT: 20.8 % — ABNORMAL LOW (ref 38.4–49.9)
HGB: 6.8 g/dL — CL (ref 13.0–17.1)
Immature Retic Fract: 11 % — ABNORMAL HIGH (ref 3.00–10.60)
LYMPH#: 0.8 10*3/uL — AB (ref 0.9–3.3)
LYMPH%: 22.5 % (ref 14.0–49.0)
MCH: 27.2 pg (ref 27.2–33.4)
MCHC: 32.7 g/dL (ref 32.0–36.0)
MCV: 83.2 fL (ref 79.3–98.0)
MONO#: 0.3 10*3/uL (ref 0.1–0.9)
MONO%: 9.8 % (ref 0.0–14.0)
NEUT%: 66.5 % (ref 39.0–75.0)
NEUTROS ABS: 2.3 10*3/uL (ref 1.5–6.5)
NRBC: 1 % — AB (ref 0–0)
Platelets: 28 10*3/uL — ABNORMAL LOW (ref 140–400)
RBC: 2.5 10*6/uL — AB (ref 4.20–5.82)
RDW: 17.1 % — AB (ref 11.0–14.6)
RETIC %: 1.11 % (ref 0.80–1.80)
RETIC CT ABS: 27.75 10*3/uL — AB (ref 34.80–93.90)
WBC: 3.5 10*3/uL — ABNORMAL LOW (ref 4.0–10.3)

## 2015-11-14 LAB — TECHNOLOGIST REVIEW

## 2015-11-14 LAB — HOLD TUBE, BLOOD BANK

## 2015-11-14 MED ORDER — SODIUM CHLORIDE 0.9 % IV SOLN
250.0000 mL | Freq: Once | INTRAVENOUS | Status: AC
  Administered 2015-11-14: 250 mL via INTRAVENOUS

## 2015-11-14 MED ORDER — ACETAMINOPHEN 325 MG PO TABS
ORAL_TABLET | ORAL | Status: AC
  Filled 2015-11-14: qty 2

## 2015-11-14 MED ORDER — DIPHENHYDRAMINE HCL 25 MG PO CAPS
25.0000 mg | ORAL_CAPSULE | Freq: Once | ORAL | Status: AC
  Administered 2015-11-14: 25 mg via ORAL

## 2015-11-14 MED ORDER — DIPHENHYDRAMINE HCL 25 MG PO CAPS
ORAL_CAPSULE | ORAL | Status: AC
  Filled 2015-11-14: qty 1

## 2015-11-14 MED ORDER — ACETAMINOPHEN 325 MG PO TABS
650.0000 mg | ORAL_TABLET | Freq: Once | ORAL | Status: AC
  Administered 2015-11-14: 650 mg via ORAL

## 2015-11-14 NOTE — Patient Instructions (Signed)

## 2015-11-14 NOTE — Progress Notes (Signed)
PT bp at 15 min 85/37.  Per Dr. Alvy Bimler, ok to proceed with transfusion.  Infusion nurse notified.

## 2015-11-16 ENCOUNTER — Emergency Department (HOSPITAL_COMMUNITY): Payer: Medicare Other

## 2015-11-16 ENCOUNTER — Emergency Department (HOSPITAL_COMMUNITY)
Admission: EM | Admit: 2015-11-16 | Discharge: 2015-11-17 | Disposition: A | Discharge status: Home / Self Care | Payer: Medicare Other | Attending: Emergency Medicine | Admitting: Emergency Medicine

## 2015-11-16 ENCOUNTER — Encounter (HOSPITAL_COMMUNITY): Payer: Self-pay

## 2015-11-16 DIAGNOSIS — I2510 Atherosclerotic heart disease of native coronary artery without angina pectoris: Secondary | ICD-10-CM | POA: Diagnosis not present

## 2015-11-16 DIAGNOSIS — Z79899 Other long term (current) drug therapy: Secondary | ICD-10-CM | POA: Diagnosis not present

## 2015-11-16 DIAGNOSIS — J3489 Other specified disorders of nose and nasal sinuses: Secondary | ICD-10-CM | POA: Insufficient documentation

## 2015-11-16 DIAGNOSIS — Z9889 Other specified postprocedural states: Secondary | ICD-10-CM | POA: Diagnosis not present

## 2015-11-16 DIAGNOSIS — Z86018 Personal history of other benign neoplasm: Secondary | ICD-10-CM | POA: Diagnosis not present

## 2015-11-16 DIAGNOSIS — Z951 Presence of aortocoronary bypass graft: Secondary | ICD-10-CM | POA: Diagnosis not present

## 2015-11-16 DIAGNOSIS — Z87891 Personal history of nicotine dependence: Secondary | ICD-10-CM | POA: Insufficient documentation

## 2015-11-16 DIAGNOSIS — M109 Gout, unspecified: Secondary | ICD-10-CM | POA: Diagnosis not present

## 2015-11-16 DIAGNOSIS — Z7952 Long term (current) use of systemic steroids: Secondary | ICD-10-CM | POA: Insufficient documentation

## 2015-11-16 DIAGNOSIS — R161 Splenomegaly, not elsewhere classified: Secondary | ICD-10-CM | POA: Diagnosis not present

## 2015-11-16 DIAGNOSIS — M069 Rheumatoid arthritis, unspecified: Secondary | ICD-10-CM | POA: Insufficient documentation

## 2015-11-16 DIAGNOSIS — Z792 Long term (current) use of antibiotics: Secondary | ICD-10-CM | POA: Diagnosis not present

## 2015-11-16 DIAGNOSIS — D61818 Other pancytopenia: Secondary | ICD-10-CM

## 2015-11-16 DIAGNOSIS — R509 Fever, unspecified: Secondary | ICD-10-CM | POA: Insufficient documentation

## 2015-11-16 DIAGNOSIS — I10 Essential (primary) hypertension: Secondary | ICD-10-CM | POA: Diagnosis not present

## 2015-11-16 DIAGNOSIS — R011 Cardiac murmur, unspecified: Secondary | ICD-10-CM | POA: Insufficient documentation

## 2015-11-16 LAB — COMPREHENSIVE METABOLIC PANEL
ALBUMIN: 2.7 g/dL — AB (ref 3.5–5.0)
ALT: 19 U/L (ref 17–63)
AST: 26 U/L (ref 15–41)
Alkaline Phosphatase: 93 U/L (ref 38–126)
Anion gap: 10 (ref 5–15)
BUN: 16 mg/dL (ref 6–20)
CALCIUM: 8.6 mg/dL — AB (ref 8.9–10.3)
CHLORIDE: 106 mmol/L (ref 101–111)
CO2: 20 mmol/L — ABNORMAL LOW (ref 22–32)
CREATININE: 0.78 mg/dL (ref 0.61–1.24)
Glucose, Bld: 120 mg/dL — ABNORMAL HIGH (ref 65–99)
Potassium: 3.7 mmol/L (ref 3.5–5.1)
SODIUM: 136 mmol/L (ref 135–145)
TOTAL PROTEIN: 6.9 g/dL (ref 6.5–8.1)
Total Bilirubin: 1.1 mg/dL (ref 0.3–1.2)

## 2015-11-16 LAB — URINALYSIS, ROUTINE W REFLEX MICROSCOPIC
BILIRUBIN URINE: NEGATIVE
Glucose, UA: NEGATIVE mg/dL
Hgb urine dipstick: NEGATIVE
KETONES UR: 15 mg/dL — AB
Leukocytes, UA: NEGATIVE
NITRITE: NEGATIVE
PROTEIN: NEGATIVE mg/dL
SPECIFIC GRAVITY, URINE: 1.022 (ref 1.005–1.030)
pH: 5 (ref 5.0–8.0)

## 2015-11-16 LAB — TYPE AND SCREEN
ABO/RH(D): O POS
ANTIBODY SCREEN: NEGATIVE
UNIT DIVISION: 0
UNIT DIVISION: 0

## 2015-11-16 LAB — CBC WITH DIFFERENTIAL/PLATELET
BASOS PCT: 1 %
Basophils Absolute: 0 10*3/uL (ref 0.0–0.1)
EOS PCT: 0 %
Eosinophils Absolute: 0 10*3/uL (ref 0.0–0.7)
HEMATOCRIT: 23.2 % — AB (ref 39.0–52.0)
HEMOGLOBIN: 7.8 g/dL — AB (ref 13.0–17.0)
LYMPHS PCT: 22 %
Lymphs Abs: 0.7 10*3/uL (ref 0.7–4.0)
MCH: 27.8 pg (ref 26.0–34.0)
MCHC: 33.6 g/dL (ref 30.0–36.0)
MCV: 82.6 fL (ref 78.0–100.0)
MONOS PCT: 30 %
Monocytes Absolute: 0.9 10*3/uL (ref 0.1–1.0)
NEUTROS ABS: 1.5 10*3/uL — AB (ref 1.7–7.7)
NEUTROS PCT: 47 %
PLATELETS: 33 10*3/uL — AB (ref 150–400)
RBC: 2.81 MIL/uL — ABNORMAL LOW (ref 4.22–5.81)
RDW: 16.2 % — ABNORMAL HIGH (ref 11.5–15.5)
WBC: 3.1 10*3/uL — ABNORMAL LOW (ref 4.0–10.5)

## 2015-11-16 LAB — I-STAT CG4 LACTIC ACID, ED: LACTIC ACID, VENOUS: 0.95 mmol/L (ref 0.5–2.0)

## 2015-11-16 NOTE — ED Notes (Addendum)
Pt has MDS and received 2 units of blood on Friday. Had chills earlier today and checked his temp and it 84.5. Verified this multiple times in triage. Pt had felt hot some time later and checked it several times later this evening and temp at 2100 was 102.6 and took 2 reg tylenol. Then took 2 earlier at 15. Pt does not do chemo or radiation. He does holistic treatments at this time.

## 2015-11-16 NOTE — ED Provider Notes (Signed)
CSN: [id number]     Arrival date & time 11/16/15  2212 History   By signing my name below, I, Forrestine Him, attest that this documentation has been prepared under the direction and in the presence of Delora Fuel, MD.  Electronically Signed: Forrestine Him, ED Scribe. 11/16/2015. 11:21 PM.   Chief Complaint  Patient presents with  . Fever   The history is provided by the patient. No language interpreter was used.    HPI Comments: ISMAEEL BIRCHARD is a 69 y.o. male with a PMHx of MDS, HTN, and CAD who presents to the Emergency Department complaining of a constant, ongoing fever onset earlier today. Fever has been 102.6 at its highest since onset measured at approximately 2100. He also reports rhinorrhea, productive cough, and congestion. No aggravating or alleviating factors at this time. OTC Tylenol attempted prior to arrival with mild improvement for fever. Last dose taken at [place]. Pt denies any recent chills, nausea, vomiting, diarrhea, and dysuria. Mr. Holverson was recently in the hospital 2 days ago and received 2 units of blood at that time.  PCP: Lilian Coma, MD    Past Medical History  Diagnosis Date  . MDS (myelodysplastic syndrome), low grade ([place]) 2012    on observation; MDS FISH panel on 04/26/2013 was normal; cytogenetics were also negative.   . Rheumatoid arthritis(714.0)     was on MTX until MDS dx  . Hypertension   . Anemia   . Family history of ischemic heart disease   . Shortness of breath     exertional  . Dysrhythmia   . Tachycardia 2013    evaluated by Dr Irish Lack @ [place]  . Gout   . Needs flu shot 08/23/2013  . Headache 03/07/2014  . CAD (coronary artery disease), native coronary artery 01/09/2015    3 v dz, EF 50%   Past Surgical History  Procedure Laterality Date  . Knee arthroscopy Left   . Hernia repair    . Umbilical hernia repair    . Circumcision    . Total knee arthroplasty Right 02/02/2013    Procedure: TOTAL KNEE ARTHROPLASTY;  Surgeon: Yvette Rack., MD;  Location: [place];  Service: Orthopedics;  Laterality: Right;  RIGHT ARTHROPLASTY KNEE MEDIAL/LATERAL COMPARTMENTS WITH PATELLA RESURFACING   . Left heart catheterization with coronary angiogram N/A 01/09/2015    Procedure: LEFT HEART CATHETERIZATION WITH CORONARY ANGIOGRAM;  Surgeon: Burnell Blanks, MD;  LAD 99/90%, D1 50%, CFX 40%, OM 70%, OM branch 80%, RCA  50%, dRCA 99%, PDA 50%, EF 50%  . Coronary artery bypass graft N/A 01/13/2015    Procedure: CORONARY ARTERY BYPASS GRAFTING (CABG) times four, using left internal mammary artery and left greater saphenous vein.;  Surgeon: Ivin Poot, MD;  Location: [place];  Service: Open Heart Surgery;  Laterality: N/A;  . Tee without cardioversion N/A 01/13/2015    Procedure: TRANSESOPHAGEAL ECHOCARDIOGRAM (TEE);  Surgeon: Ivin Poot, MD;  Location: [place];  Service: Open Heart Surgery;  Laterality: N/A;   Family History  Problem Relation Age of Onset  . Brain cancer Mother   . Heart disease Father     open heart surgery  . Breast cancer Sister   . Heart disease Brother     open heart surgery  . Diabetes Brother   . Heart attack Father   . Stroke Neg Hx   . Heart attack Brother    Social History  Substance Use Topics  . Smoking status: Former  Smoker    Types: Cigarettes    Quit date: 01/26/1970  . Smokeless tobacco: Never Used  . Alcohol Use: No    Review of Systems  Constitutional: Positive for fever and chills.  HENT: Positive for congestion and rhinorrhea.   Respiratory: Positive for cough. Negative for shortness of breath.   Cardiovascular: Negative for chest pain.  Gastrointestinal: Negative for nausea, vomiting, abdominal pain and diarrhea.  Genitourinary: Negative for dysuria.  Musculoskeletal: Negative for back pain.  Neurological: Negative for headaches.  Psychiatric/Behavioral: Negative for confusion.  All other systems reviewed and are negative.     Allergies  Review of patient's allergies  indicates no known allergies.  Home Medications   Prior to Admission medications   Medication Sig Start Date End Date Taking? Authorizing Provider  acetaminophen (TYLENOL) 500 MG tablet Take 1,000 mg by mouth every 6 (six) hours as needed for pain.   Yes Historical Provider, MD  allopurinol (ZYLOPRIM) 100 MG tablet Take 50 mg by mouth daily.  04/14/13  Yes Historical Provider, MD  ALPRAZolam Duanne Moron) 0.25 MG tablet Take 0.125 mg by mouth 2 (two) times daily as needed for anxiety.  12/30/14  Yes Historical Provider, MD  atorvastatin (LIPITOR) 20 MG tablet Take 1 tablet (20 mg total) by mouth daily. 04/23/15  Yes Jettie Booze, MD  Calcium-Vitamin D (CALTRATE 600 PLUS-VIT D PO) Take 1 tablet by mouth 2 (two) times daily.    Yes Historical Provider, MD  lisinopril-hydrochlorothiazide (PRINZIDE,ZESTORETIC) 10-12.5 MG per tablet Take 0.5 tablets by mouth daily. 07/22/15  Yes Jettie Booze, MD  loratadine (CLARITIN) 10 MG tablet Take 10 mg by mouth daily.   Yes Historical Provider, MD  metoprolol (LOPRESSOR) 50 MG tablet take 1/2 tablet by mouth twice a day 11/10/15  Yes Jettie Booze, MD  MULTIPLE VITAMINS-MINERALS PO Take 1 tablet by mouth 2 (two) times daily.   Yes Historical Provider, MD  NITROSTAT 0.4 MG SL tablet Place 0.4 mg under the tongue every 5 (five) minutes as needed.  12/31/14  Yes Historical Provider, MD  Omega-3 Fatty Acids (FISH OIL) 1000 MG CAPS Take 2,000 mg by mouth 2 (two) times daily.   Yes Historical Provider, MD  predniSONE (DELTASONE) 5 MG tablet Take 7.5 mg by mouth daily.    Yes Historical Provider, MD  traMADol (ULTRAM) 50 MG tablet Take 25 mg by mouth every 4 (four) hours as needed for pain.    Yes Historical Provider, MD  levofloxacin (LEVAQUIN) 500 MG tablet Take 1 tablet (500 mg total) by mouth daily. 09/04/15   Heath Lark, MD  ondansetron (ZOFRAN ODT) 4 MG disintegrating tablet 4mg  ODT q4 hours prn nausea/vomit 06/03/15   Debby Freiberg, MD   Triage Vitals:  BP 109/64 mmHg  Pulse 107  Temp(Src) 100.3 F (37.9 C) (Oral)  Resp 22  Ht 5\' 7"  (1.702 m)  Wt 158 lb (71.668 kg)  BMI 24.74 kg/m2  SpO2 96%   Physical Exam  Constitutional: He is oriented to person, place, and time. He appears well-developed and well-nourished.  HENT:  Head: Normocephalic and atraumatic.  No sinus tenderness. No nasal drainage or swelling of turbinates.  Eyes: EOM are normal. Pupils are equal, round, and reactive to light.  Neck: Normal range of motion. Neck supple. No JVD present.  Cardiovascular: Normal rate, regular rhythm and intact distal pulses.   [id number] systolic ejection murmur at the left sternal border  Pulmonary/Chest: Effort normal and breath sounds normal. He has no wheezes. He has  no rales. He exhibits no tenderness.  Abdominal: Soft. Bowel sounds are normal. He exhibits no distension and no mass. There is no tenderness.  Moderate splenic enlargement - nontender.  Musculoskeletal: Normal range of motion. He exhibits no edema.  Lymphadenopathy:    He has no cervical adenopathy.  Neurological: He is alert and oriented to person, place, and time. No cranial nerve deficit. He exhibits normal muscle tone. Coordination normal.  Skin: Skin is warm and dry. No rash noted.  Psychiatric: He has a normal mood and affect. His behavior is normal. Judgment and thought content normal.  Nursing note and vitals reviewed.   ED Course  Procedures (including critical care time)  DIAGNOSTIC STUDIES: Oxygen Saturation is 96% on RA, adequate by my interpretation.    COORDINATION OF CARE: 11:09 PM- Will order CXR, CBC, i-stat CG4 lactic acid, CMP, and urinalysis. Discussed treatment plan with pt at bedside and pt agreed to plan.     Labs Review Results for orders placed or performed during the hospital encounter of 11/16/15  Comprehensive metabolic panel  Result Value Ref Range   Sodium 136 135 - 145 mmol/L   Potassium 3.7 3.5 - 5.1 mmol/L   Chloride 106 101 - 111  mmol/L   CO2 20 (L) 22 - 32 mmol/L   Glucose, Bld 120 (H) 65 - 99 mg/dL   BUN 16 6 - 20 mg/dL   Creatinine, Ser 0.78 0.61 - 1.24 mg/dL   Calcium 8.6 (L) 8.9 - 10.3 mg/dL   Total Protein 6.9 6.5 - 8.1 g/dL   Albumin 2.7 (L) 3.5 - 5.0 g/dL   AST 26 15 - 41 U/L   ALT 19 17 - 63 U/L   Alkaline Phosphatase 93 38 - 126 U/L   Total Bilirubin 1.1 0.3 - 1.2 mg/dL   GFR calc non Af Amer >60 >60 mL/min   GFR calc Af Amer >60 >60 mL/min   Anion gap 10 5 - 15  Urinalysis, Routine w reflex microscopic (not at [facility])  Result Value Ref Range   Color, Urine YELLOW YELLOW   APPearance CLEAR CLEAR   Specific Gravity, Urine 1.022 1.005 - 1.030   pH 5.0 5.0 - 8.0   Glucose, UA NEGATIVE NEGATIVE mg/dL   Hgb urine dipstick NEGATIVE NEGATIVE   Bilirubin Urine NEGATIVE NEGATIVE   Ketones, ur 15 (A) NEGATIVE mg/dL   Protein, ur NEGATIVE NEGATIVE mg/dL   Nitrite NEGATIVE NEGATIVE   Leukocytes, UA NEGATIVE NEGATIVE  CBC with Differential  Result Value Ref Range   WBC 3.1 (L) 4.0 - 10.5 K/uL   RBC 2.81 (L) 4.22 - 5.81 MIL/uL   Hemoglobin 7.8 (L) 13.0 - 17.0 g/dL   HCT 23.2 (L) 39.0 - 52.0 %   MCV 82.6 78.0 - 100.0 fL   MCH 27.8 26.0 - 34.0 pg   MCHC 33.6 30.0 - 36.0 g/dL   RDW 16.2 (H) 11.5 - 15.5 %   Platelets 33 (L) 150 - 400 K/uL   Neutrophils Relative % 47 %   Lymphocytes Relative 22 %   Monocytes Relative 30 %   Eosinophils Relative 0 %   Basophils Relative 1 %   Neutro Abs 1.5 (L) 1.7 - 7.7 K/uL   Lymphs Abs 0.7 0.7 - 4.0 K/uL   Monocytes Absolute 0.9 0.1 - 1.0 K/uL   Eosinophils Absolute 0.0 0.0 - 0.7 K/uL   Basophils Absolute 0.0 0.0 - 0.1 K/uL   RBC Morphology POLYCHROMASIA PRESENT    WBC Morphology MILD  LEFT SHIFT (1-5% METAS, OCC MYELO, OCC BANDS)   I-Stat CG4 Lactic Acid, ED  (not at [facility])  Result Value Ref Range   Lactic Acid, Venous 0.95 0.5 - 2.0 mmol/L  I-Stat CG4 Lactic Acid, ED  (not at [facility])  Result Value Ref Range   Lactic Acid, Venous 0.60 0.5 - 2.0 mmol/L   Imaging  Review Dg Chest 2 View  11/16/2015  CLINICAL DATA:  Chills earlier today with low body temperature. Subsequent found to have elevated temperature. History of myelodysplastic syndrome. EXAM: CHEST  2 VIEW COMPARISON:  06/03/2015 FINDINGS: Postoperative changes in the mediastinum. Normal heart size and pulmonary vascularity. No focal airspace disease or consolidation in the lungs. No blunting of costophrenic angles. No pneumothorax. Mediastinal contours appear intact. Calcification of the aorta. IMPRESSION: No active cardiopulmonary disease. Electronically Signed   By: Lucienne Capers M.D.   On: 11/16/2015 23:37   I have personally reviewed and evaluated these images and lab results as part of my medical decision-making.    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 Old records have been reviewed and he had been given 2 units of packed red blood cells 2 days ago. He has had adequate granulocyte count recently. He does not appear toxic. In the ED, he is noted of normal lactic acid, unremarkable chest x-ray, and clean urine. WBC is 3.1 with 47% neutrophils which does give him an adequate absolute neutrophil count. Therefore, he does not require admission with intravenous antibiotics for his fever. This appears to be a viral illness although cultures have been sent. He is discharged with instructions to return should symptoms worsen. Of note, hemoglobin only rose 1 g despite getting 2 units of blood. I am concerned about possible red cell destruction by the spleen.  I personally performed the services described in this documentation, which was scribed in my presence. The recorded information has been reviewed and is accurate.      Delora Fuel, MD [id number]

## 2015-11-16 NOTE — ED Notes (Signed)
Patient with a oral temp of 99.2 at this time.

## 2015-11-17 ENCOUNTER — Telehealth: Payer: Self-pay | Admitting: *Deleted

## 2015-11-17 DIAGNOSIS — R509 Fever, unspecified: Secondary | ICD-10-CM | POA: Diagnosis not present

## 2015-11-17 LAB — I-STAT CG4 LACTIC ACID, ED: LACTIC ACID, VENOUS: 0.6 mmol/L (ref 0.5–2.0)

## 2015-11-17 NOTE — Discharge Instructions (Signed)
Return if symptoms are getting worse.   Fever, Adult A fever is an increase in the body's temperature. It is usually defined as a temperature of 100F (38C) or higher. Brief mild or moderate fevers generally have no long-term effects, and they often do not require treatment. Moderate or high fevers may make you feel uncomfortable and can sometimes be a sign of a serious illness or disease. The sweating that may occur with repeated or prolonged fever may also cause dehydration. Fever is confirmed by taking a temperature with a thermometer. A measured temperature can vary with:  Age.  Time of day.  Location of the thermometer:  Mouth (oral).  Rectum (rectal).  Ear (tympanic).  Underarm (axillary).  Forehead (temporal). HOME CARE INSTRUCTIONS Pay attention to any changes in your symptoms. Take these actions to help with your condition:  Take over-the counter and prescription medicines only as told by your health care provider. Follow the dosing instructions carefully.  If you were prescribed an antibiotic medicine, take it as told by your health care provider. Do not stop taking the antibiotic even if you start to feel better.  Rest as needed.  Drink enough fluid to keep your urine clear or pale yellow. This helps to prevent dehydration.  Sponge yourself or bathe with room-temperature water to help reduce your body temperature as needed. Do not use ice water.  Do not overbundle yourself in blankets or heavy clothes. SEEK MEDICAL CARE IF:  You vomit.  You cannot eat or drink without vomiting.  You have diarrhea.  You have pain when you urinate.  Your symptoms do not improve with treatment.  You develop new symptoms.  You develop excessive weakness. SEEK IMMEDIATE MEDICAL CARE IF:  You have shortness of breath or have trouble breathing.  You are dizzy or you faint.  You are disoriented or confused.  You develop signs of dehydration, such as a dry mouth,  decreased urination, or paleness.  You develop severe pain in your abdomen.  You have persistent vomiting or diarrhea.  You develop a skin rash.  Your symptoms suddenly get worse.   This information is not intended to replace advice given to you by your health care provider. Make sure you discuss any questions you have with your health care provider.   Document Released: 05/18/2001 Document Revised: 08/13/2015 Document Reviewed: 01/16/2015 Elsevier Interactive Patient Education [employer].

## 2015-11-17 NOTE — Telephone Encounter (Signed)
Pt states he is doing fine. Does not need to be seen at this point.

## 2015-11-17 NOTE — Telephone Encounter (Signed)
-----   Message from Heath Lark, MD sent at 11/17/2015  8:31 AM EST ----- Regarding: how is he? Can you call to see how he is doing? Does he feel bad? I can see him at 39 am Wed otherwise if he feels OK no need to come in

## 2015-11-17 NOTE — ED Notes (Signed)
Pt left with all his belongings and ambulated out of treatment area.  

## 2015-11-18 LAB — URINE CULTURE: CULTURE: NO GROWTH

## 2015-11-21 LAB — CULTURE, BLOOD (ROUTINE X 2): Culture: NO GROWTH

## 2015-11-22 ENCOUNTER — Encounter (HOSPITAL_COMMUNITY): Payer: Self-pay | Admitting: Emergency Medicine

## 2015-11-22 ENCOUNTER — Emergency Department (HOSPITAL_COMMUNITY)
Admission: EM | Admit: 2015-11-22 | Discharge: 2015-11-22 | Disposition: A | Discharge status: Home / Self Care | Payer: Medicare Other | Attending: Emergency Medicine | Admitting: Emergency Medicine

## 2015-11-22 ENCOUNTER — Telehealth: Payer: Self-pay | Admitting: *Deleted

## 2015-11-22 DIAGNOSIS — Z9889 Other specified postprocedural states: Secondary | ICD-10-CM | POA: Insufficient documentation

## 2015-11-22 DIAGNOSIS — R0981 Nasal congestion: Secondary | ICD-10-CM | POA: Insufficient documentation

## 2015-11-22 DIAGNOSIS — Z7952 Long term (current) use of systemic steroids: Secondary | ICD-10-CM | POA: Diagnosis not present

## 2015-11-22 DIAGNOSIS — R531 Weakness: Secondary | ICD-10-CM | POA: Insufficient documentation

## 2015-11-22 DIAGNOSIS — R011 Cardiac murmur, unspecified: Secondary | ICD-10-CM | POA: Insufficient documentation

## 2015-11-22 DIAGNOSIS — Z0000 Encounter for general adult medical examination without abnormal findings: Secondary | ICD-10-CM | POA: Diagnosis not present

## 2015-11-22 DIAGNOSIS — I2510 Atherosclerotic heart disease of native coronary artery without angina pectoris: Secondary | ICD-10-CM | POA: Insufficient documentation

## 2015-11-22 DIAGNOSIS — Z79899 Other long term (current) drug therapy: Secondary | ICD-10-CM | POA: Insufficient documentation

## 2015-11-22 DIAGNOSIS — Z87891 Personal history of nicotine dependence: Secondary | ICD-10-CM | POA: Insufficient documentation

## 2015-11-22 DIAGNOSIS — R5383 Other fatigue: Secondary | ICD-10-CM | POA: Insufficient documentation

## 2015-11-22 DIAGNOSIS — Z951 Presence of aortocoronary bypass graft: Secondary | ICD-10-CM | POA: Insufficient documentation

## 2015-11-22 DIAGNOSIS — M109 Gout, unspecified: Secondary | ICD-10-CM | POA: Insufficient documentation

## 2015-11-22 DIAGNOSIS — Z862 Personal history of diseases of the blood and blood-forming organs and certain disorders involving the immune mechanism: Secondary | ICD-10-CM | POA: Diagnosis not present

## 2015-11-22 DIAGNOSIS — I10 Essential (primary) hypertension: Secondary | ICD-10-CM | POA: Diagnosis not present

## 2015-11-22 DIAGNOSIS — Z09 Encounter for follow-up examination after completed treatment for conditions other than malignant neoplasm: Secondary | ICD-10-CM | POA: Diagnosis present

## 2015-11-22 DIAGNOSIS — Z0189 Encounter for other specified special examinations: Secondary | ICD-10-CM

## 2015-11-22 LAB — CBC WITH DIFFERENTIAL/PLATELET
Basophils Absolute: 0 10*3/uL (ref 0.0–0.1)
Basophils Relative: 1 %
EOS ABS: 0 10*3/uL (ref 0.0–0.7)
EOS PCT: 0 %
HEMATOCRIT: 22.2 % — AB (ref 39.0–52.0)
Hemoglobin: 7.2 g/dL — ABNORMAL LOW (ref 13.0–17.0)
LYMPHS PCT: 21 %
Lymphs Abs: 0.9 10*3/uL (ref 0.7–4.0)
MCH: 26.9 pg (ref 26.0–34.0)
MCHC: 32.4 g/dL (ref 30.0–36.0)
MCV: 82.8 fL (ref 78.0–100.0)
MONO ABS: 1.2 10*3/uL — AB (ref 0.1–1.0)
Monocytes Relative: 27 %
Neutro Abs: 2.2 10*3/uL (ref 1.7–7.7)
Neutrophils Relative %: 51 %
PLATELETS: 29 10*3/uL — AB (ref 150–400)
RBC: 2.68 MIL/uL — AB (ref 4.22–5.81)
RDW: 16.3 % — AB (ref 11.5–15.5)
WBC: 4.3 10*3/uL (ref 4.0–10.5)

## 2015-11-22 NOTE — Discharge Instructions (Signed)
Continue with your planned transfusion on Friday.  Return to ER, or recheck with primary care with any additional abnormalities.

## 2015-11-22 NOTE — ED Provider Notes (Addendum)
CSN: [id number]     Arrival date & time 11/22/15  [id number] History   First MD Initiated Contact with Patient 11/22/15 0715     Chief Complaint  Patient presents with  . Follow-up    labs      HPI  Pt Inpatient 12/9 for blood transfusion x 2U PRBSs 2/2 Myelodysplastic syndrome.  Seen in ED 12/11 with fever.  No source, adequate Neutrophil count.  BLC x2 Neg at 5 days yesterday.  However, htis am over-read for 1/2 bottles + GRAM VARIABLE ROD ANAEROBIC BOTTLE ONLY.  Pt asked to return.  Last 4 days he has felt well. States he always feels somewhat weak, which he relates to his anemia. His had no fever shakes chills. He does continue to have some nasal congestion. No sinus pain or pressure. No chest pain. Does not feel dyspneic. No abdominal pain nausea vomiting or diarrhea. No skin rash. No additional symptoms.  Past Medical History  Diagnosis Date  . MDS (myelodysplastic syndrome), low grade ([place]) 2012    on observation; MDS FISH panel on 04/26/2013 was normal; cytogenetics were also negative.   . Rheumatoid arthritis(714.0)     was on MTX until MDS dx  . Hypertension   . Anemia   . Family history of ischemic heart disease   . Shortness of breath     exertional  . Dysrhythmia   . Tachycardia 2013    evaluated by Dr Irish Lack @ [place]  . Gout   . Needs flu shot 08/23/2013  . Headache 03/07/2014  . CAD (coronary artery disease), native coronary artery 01/09/2015    3 v dz, EF 50%   Past Surgical History  Procedure Laterality Date  . Knee arthroscopy Left   . Hernia repair    . Umbilical hernia repair    . Circumcision    . Total knee arthroplasty Right 02/02/2013    Procedure: TOTAL KNEE ARTHROPLASTY;  Surgeon: Yvette Rack., MD;  Location: [place];  Service: Orthopedics;  Laterality: Right;  RIGHT ARTHROPLASTY KNEE MEDIAL/LATERAL COMPARTMENTS WITH PATELLA RESURFACING   . Left heart catheterization with coronary angiogram N/A 01/09/2015    Procedure: LEFT HEART CATHETERIZATION WITH  CORONARY ANGIOGRAM;  Surgeon: Burnell Blanks, MD;  LAD 99/90%, D1 50%, CFX 40%, OM 70%, OM branch 80%, RCA  50%, dRCA 99%, PDA 50%, EF 50%  . Coronary artery bypass graft N/A 01/13/2015    Procedure: CORONARY ARTERY BYPASS GRAFTING (CABG) times four, using left internal mammary artery and left greater saphenous vein.;  Surgeon: Ivin Poot, MD;  Location: [place];  Service: Open Heart Surgery;  Laterality: N/A;  . Tee without cardioversion N/A 01/13/2015    Procedure: TRANSESOPHAGEAL ECHOCARDIOGRAM (TEE);  Surgeon: Ivin Poot, MD;  Location: [place];  Service: Open Heart Surgery;  Laterality: N/A;   Family History  Problem Relation Age of Onset  . Brain cancer Mother   . Heart disease Father     open heart surgery  . Breast cancer Sister   . Heart disease Brother     open heart surgery  . Diabetes Brother   . Heart attack Father   . Stroke Neg Hx   . Heart attack Brother    Social History  Substance Use Topics  . Smoking status: Former Smoker    Types: Cigarettes    Quit date: 01/26/1970  . Smokeless tobacco: Never Used  . Alcohol Use: No    Review of Systems  Constitutional: Positive for fatigue.  Negative for fever, chills, diaphoresis and appetite change.  HENT: Negative for mouth sores, sore throat and trouble swallowing.   Eyes: Negative for visual disturbance.  Respiratory: Negative for cough, chest tightness, shortness of breath and wheezing.   Cardiovascular: Negative for chest pain.  Gastrointestinal: Negative for nausea, vomiting, abdominal pain, diarrhea and abdominal distention.  Endocrine: Negative for polydipsia, polyphagia and polyuria.  Genitourinary: Negative for dysuria, frequency and hematuria.  Musculoskeletal: Negative for gait problem.  Skin: Negative for color change, pallor and rash.  Neurological: Positive for weakness. Negative for dizziness, syncope, light-headedness and headaches.  Hematological: Does not bruise/bleed easily.   Psychiatric/Behavioral: Negative for behavioral problems and confusion.      Allergies  Review of patient's allergies indicates no known allergies.  Home Medications   Prior to Admission medications   Medication Sig Start Date End Date Taking? Authorizing Provider  acetaminophen (TYLENOL) 500 MG tablet Take 1,000 mg by mouth every 6 (six) hours as needed for pain.   Yes Historical Provider, MD  allopurinol (ZYLOPRIM) 100 MG tablet Take 50 mg by mouth daily.  04/14/13  Yes Historical Provider, MD  ALPRAZolam Duanne Moron) 0.25 MG tablet Take 0.125 mg by mouth 2 (two) times daily as needed for anxiety.  12/30/14  Yes Historical Provider, MD  atorvastatin (LIPITOR) 20 MG tablet Take 1 tablet (20 mg total) by mouth daily. 04/23/15  Yes Jettie Booze, MD  Calcium-Vitamin D (CALTRATE 600 PLUS-VIT D PO) Take 1 tablet by mouth 2 (two) times daily.    Yes Historical Provider, MD  lisinopril-hydrochlorothiazide (PRINZIDE,ZESTORETIC) 10-12.5 MG per tablet Take 0.5 tablets by mouth daily. 07/22/15  Yes Jettie Booze, MD  loratadine (CLARITIN) 10 MG tablet Take 10 mg by mouth daily.   Yes Historical Provider, MD  metoprolol (LOPRESSOR) 50 MG tablet take 1/2 tablet by mouth twice a day 11/10/15  Yes Jettie Booze, MD  MULTIPLE VITAMINS-MINERALS PO Take 1 tablet by mouth 2 (two) times daily.   Yes Historical Provider, MD  NITROSTAT 0.4 MG SL tablet Place 0.4 mg under the tongue every 5 (five) minutes as needed.  12/31/14  Yes Historical Provider, MD  Omega-3 Fatty Acids (FISH OIL) 1000 MG CAPS Take 2,000 mg by mouth 2 (two) times daily.   Yes Historical Provider, MD  predniSONE (DELTASONE) 5 MG tablet Take 7.5 mg by mouth daily.    Yes Historical Provider, MD  traMADol (ULTRAM) 50 MG tablet Take 25 mg by mouth every 4 (four) hours as needed for pain.    Yes Historical Provider, MD  levofloxacin (LEVAQUIN) 500 MG tablet Take 1 tablet (500 mg total) by mouth daily. Patient not taking: Reported on  11/22/2015 09/04/15   Heath Lark, MD  ondansetron (ZOFRAN ODT) 4 MG disintegrating tablet 4mg  ODT q4 hours prn nausea/vomit 06/03/15   Debby Freiberg, MD   BP 105/53 mmHg  Pulse 72  Temp(Src) 98.3 F (36.8 C) (Oral)  Resp 20  Ht 5\' 7"  (1.702 m)  Wt 158 lb (71.668 kg)  BMI 24.74 kg/m2  SpO2 100% Physical Exam  Constitutional: He is oriented to person, place, and time. He appears well-developed and well-nourished. No distress.  HENT:  Head: Normocephalic.  Eyes: Conjunctivae are normal. Pupils are equal, round, and reactive to light. No scleral icterus.  Neck: Normal range of motion. Neck supple. No thyromegaly present.  Cardiovascular: Normal rate and regular rhythm.  Exam reveals no gallop and no friction rub.   Murmur heard.  Systolic murmur is present with  a grade of 2/6  Pulmonary/Chest: Effort normal and breath sounds normal. No respiratory distress. He has no wheezes. He has no rales.  Abdominal: Soft. Bowel sounds are normal. He exhibits no distension. There is no tenderness. There is no rebound.  Musculoskeletal: Normal range of motion.  Neurological: He is alert and oriented to person, place, and time.  Skin: Skin is warm and dry. No rash noted.  Psychiatric: He has a normal mood and affect. His behavior is normal.    ED Course  Procedures (including critical care time) Labs Review Labs Reviewed  CBC WITH DIFFERENTIAL/PLATELET - Abnormal; Notable for the following:    RBC 2.68 (*)    Hemoglobin 7.2 (*)    HCT 22.2 (*)    RDW 16.3 (*)    Platelets 29 (*)    Monocytes Absolute 1.2 (*)    All other components within normal limits    Imaging Review No results found. I have personally reviewed and evaluated these images and lab results as part of my medical decision-making.   EKG Interpretation None      MDM   Final diagnoses:  Encounter for laboratory examination   Patient is afebrile.. Feeling well. Adequate white blood cell count, without neutropenia. Have  consulted ID. Awaiting return of the phone call. Patient insists that he cannot wait as he has to leave to get his grandson's basketball game. I will contact him with any further need for testing or treatment.  11:08:  Received a call from Dr. Retia Passe of ID. Recommends repeat BLC.  No empiric therapy.  RTER with fever or any other symptoms.    Tanna Furry, MD 11/22/15 1059  Tanna Furry, MD 11/22/15 [phone]

## 2015-11-22 NOTE — ED Notes (Signed)
(+)  blood culture results discussed with ED physician, Corky Downs who advised to call patient and have him return for further evaluation.  Spoke with patient via phone who state he will return this morning for evaluation to the [facility] Emergency Department.

## 2015-11-22 NOTE — ED Notes (Signed)
Pt comes from with a call from the MD office to come in for a possible infection. Pt has cancer and gets blood transfusions, last transfusion on Friday. Pt has no complaints. Wife at bedside.

## 2015-11-24 LAB — CULTURE, BLOOD (ROUTINE X 2)

## 2015-11-27 LAB — CULTURE, BLOOD (SINGLE): Culture: NO GROWTH

## 2015-11-28 ENCOUNTER — Other Ambulatory Visit (HOSPITAL_BASED_OUTPATIENT_CLINIC_OR_DEPARTMENT_OTHER): Payer: Medicare Other

## 2015-11-28 ENCOUNTER — Ambulatory Visit (HOSPITAL_BASED_OUTPATIENT_CLINIC_OR_DEPARTMENT_OTHER): Payer: Medicare Other

## 2015-11-28 ENCOUNTER — Other Ambulatory Visit: Payer: Self-pay | Admitting: *Deleted

## 2015-11-28 VITALS — BP 106/62 | HR 65 | Temp 98.2°F | Resp 18

## 2015-11-28 DIAGNOSIS — D469 Myelodysplastic syndrome, unspecified: Secondary | ICD-10-CM

## 2015-11-28 DIAGNOSIS — D4620 Refractory anemia with excess of blasts, unspecified: Secondary | ICD-10-CM | POA: Diagnosis present

## 2015-11-28 LAB — CBC & DIFF AND RETIC
BASO%: 0.7 % (ref 0.0–2.0)
BASOS ABS: 0 10*3/uL (ref 0.0–0.1)
EOS ABS: 0 10*3/uL (ref 0.0–0.5)
EOS%: 0 % (ref 0.0–7.0)
HEMATOCRIT: 20 % — AB (ref 38.4–49.9)
HEMOGLOBIN: 6.6 g/dL — AB (ref 13.0–17.1)
Immature Retic Fract: 12.8 % — ABNORMAL HIGH (ref 3.00–10.60)
LYMPH%: 17.4 % (ref 14.0–49.0)
MCH: 27.3 pg (ref 27.2–33.4)
MCHC: 33 g/dL (ref 32.0–36.0)
MCV: 82.6 fL (ref 79.3–98.0)
MONO#: 1 10*3/uL — ABNORMAL HIGH (ref 0.1–0.9)
MONO%: 22 % — AB (ref 0.0–14.0)
NEUT#: 2.6 10*3/uL (ref 1.5–6.5)
NEUT%: 59.9 % (ref 39.0–75.0)
PLATELETS: 29 10*3/uL — AB (ref 140–400)
RBC: 2.42 10*6/uL — ABNORMAL LOW (ref 4.20–5.82)
RDW: 16.3 % — ABNORMAL HIGH (ref 11.0–14.6)
RETIC %: 1.13 % (ref 0.80–1.80)
RETIC CT ABS: 27.35 10*3/uL — AB (ref 34.80–93.90)
WBC: 4.4 10*3/uL (ref 4.0–10.3)
lymph#: 0.8 10*3/uL — ABNORMAL LOW (ref 0.9–3.3)

## 2015-11-28 LAB — TECHNOLOGIST REVIEW

## 2015-11-28 LAB — HOLD TUBE, BLOOD BANK

## 2015-11-28 LAB — PREPARE RBC (CROSSMATCH)

## 2015-11-28 MED ORDER — DIPHENHYDRAMINE HCL 25 MG PO CAPS
25.0000 mg | ORAL_CAPSULE | Freq: Once | ORAL | Status: AC
  Administered 2015-11-28: 25 mg via ORAL

## 2015-11-28 MED ORDER — ACETAMINOPHEN 325 MG PO TABS
650.0000 mg | ORAL_TABLET | Freq: Once | ORAL | Status: AC
  Administered 2015-11-28: 650 mg via ORAL

## 2015-11-28 MED ORDER — SODIUM CHLORIDE 0.9 % IV SOLN
250.0000 mL | Freq: Once | INTRAVENOUS | Status: AC
  Administered 2015-11-28: 250 mL via INTRAVENOUS

## 2015-11-28 MED ORDER — ACETAMINOPHEN 325 MG PO TABS
ORAL_TABLET | ORAL | Status: AC
  Filled 2015-11-28: qty 2

## 2015-11-28 MED ORDER — DIPHENHYDRAMINE HCL 25 MG PO CAPS
ORAL_CAPSULE | ORAL | Status: AC
  Filled 2015-11-28: qty 1

## 2015-11-28 NOTE — Patient Instructions (Signed)

## 2015-11-29 LAB — TYPE AND SCREEN
ABO/RH(D): O POS
Antibody Screen: NEGATIVE
UNIT DIVISION: 0
Unit division: 0

## 2015-12-09 ENCOUNTER — Ambulatory Visit (HOSPITAL_COMMUNITY)
Admission: RE | Admit: 2015-12-09 | Discharge: 2015-12-09 | Disposition: A | Discharge status: Home / Self Care | Payer: Medicare Other | Source: Ambulatory Visit | Attending: Hematology and Oncology | Admitting: Hematology and Oncology

## 2015-12-09 DIAGNOSIS — D469 Myelodysplastic syndrome, unspecified: Secondary | ICD-10-CM | POA: Insufficient documentation

## 2015-12-09 DIAGNOSIS — D630 Anemia in neoplastic disease: Secondary | ICD-10-CM

## 2015-12-09 DIAGNOSIS — D4620 Refractory anemia with excess of blasts, unspecified: Secondary | ICD-10-CM

## 2015-12-12 ENCOUNTER — Telehealth: Payer: Self-pay | Admitting: Hematology and Oncology

## 2015-12-12 ENCOUNTER — Ambulatory Visit (HOSPITAL_BASED_OUTPATIENT_CLINIC_OR_DEPARTMENT_OTHER): Payer: Medicare Other | Admitting: Hematology and Oncology

## 2015-12-12 ENCOUNTER — Other Ambulatory Visit (HOSPITAL_BASED_OUTPATIENT_CLINIC_OR_DEPARTMENT_OTHER): Payer: Medicare Other

## 2015-12-12 ENCOUNTER — Encounter: Payer: Self-pay | Admitting: Hematology and Oncology

## 2015-12-12 ENCOUNTER — Ambulatory Visit (HOSPITAL_BASED_OUTPATIENT_CLINIC_OR_DEPARTMENT_OTHER): Payer: Medicare Other

## 2015-12-12 ENCOUNTER — Telehealth: Payer: Self-pay | Admitting: *Deleted

## 2015-12-12 ENCOUNTER — Other Ambulatory Visit: Payer: Self-pay | Admitting: Hematology and Oncology

## 2015-12-12 VITALS — BP 95/57 | HR 95 | Temp 98.2°F | Resp 19 | Wt 151.5 lb

## 2015-12-12 VITALS — BP 105/49 | HR 70 | Temp 98.1°F | Resp 17

## 2015-12-12 DIAGNOSIS — R509 Fever, unspecified: Secondary | ICD-10-CM

## 2015-12-12 DIAGNOSIS — D7581 Myelofibrosis: Secondary | ICD-10-CM

## 2015-12-12 DIAGNOSIS — E46 Unspecified protein-calorie malnutrition: Secondary | ICD-10-CM

## 2015-12-12 DIAGNOSIS — D469 Myelodysplastic syndrome, unspecified: Secondary | ICD-10-CM | POA: Diagnosis present

## 2015-12-12 DIAGNOSIS — D471 Chronic myeloproliferative disease: Secondary | ICD-10-CM

## 2015-12-12 DIAGNOSIS — D61818 Other pancytopenia: Secondary | ICD-10-CM

## 2015-12-12 DIAGNOSIS — D4620 Refractory anemia with excess of blasts, unspecified: Secondary | ICD-10-CM

## 2015-12-12 DIAGNOSIS — D696 Thrombocytopenia, unspecified: Secondary | ICD-10-CM | POA: Diagnosis not present

## 2015-12-12 DIAGNOSIS — D630 Anemia in neoplastic disease: Secondary | ICD-10-CM | POA: Diagnosis not present

## 2015-12-12 LAB — CBC & DIFF AND RETIC
BASO%: 1.2 % (ref 0.0–2.0)
Basophils Absolute: 0.1 10*3/uL (ref 0.0–0.1)
EOS%: 0 % (ref 0.0–7.0)
Eosinophils Absolute: 0 10*3/uL (ref 0.0–0.5)
HEMATOCRIT: 21.1 % — AB (ref 38.4–49.9)
HGB: 6.9 g/dL — CL (ref 13.0–17.1)
Immature Retic Fract: 24.7 % — ABNORMAL HIGH (ref 3.00–10.60)
LYMPH%: 20.5 % (ref 14.0–49.0)
MCH: 26.5 pg — AB (ref 27.2–33.4)
MCHC: 32.7 g/dL (ref 32.0–36.0)
MCV: 81.2 fL (ref 79.3–98.0)
MONO#: 0.8 10*3/uL (ref 0.1–0.9)
MONO%: 15.9 % — AB (ref 0.0–14.0)
NEUT%: 62.4 % (ref 39.0–75.0)
NEUTROS ABS: 3.1 10*3/uL (ref 1.5–6.5)
Platelets: 25 10*3/uL — ABNORMAL LOW (ref 140–400)
RBC: 2.6 10*6/uL — ABNORMAL LOW (ref 4.20–5.82)
RDW: 17.1 % — AB (ref 11.0–14.6)
RETIC %: 1.83 % — AB (ref 0.80–1.80)
Retic Ct Abs: 47.58 10*3/uL (ref 34.80–93.90)
WBC: 5 10*3/uL (ref 4.0–10.3)
lymph#: 1 10*3/uL (ref 0.9–3.3)

## 2015-12-12 LAB — PREPARE RBC (CROSSMATCH)

## 2015-12-12 LAB — TECHNOLOGIST REVIEW

## 2015-12-12 LAB — HOLD TUBE, BLOOD BANK

## 2015-12-12 MED ORDER — SODIUM CHLORIDE 0.9 % IV SOLN
250.0000 mL | Freq: Once | INTRAVENOUS | Status: AC
  Administered 2015-12-12: 250 mL via INTRAVENOUS

## 2015-12-12 MED ORDER — ACETAMINOPHEN 325 MG PO TABS
ORAL_TABLET | ORAL | Status: AC
  Filled 2015-12-12: qty 2

## 2015-12-12 MED ORDER — ACETAMINOPHEN 325 MG PO TABS
650.0000 mg | ORAL_TABLET | Freq: Once | ORAL | Status: AC
  Administered 2015-12-12: 650 mg via ORAL

## 2015-12-12 MED ORDER — DIPHENHYDRAMINE HCL 25 MG PO CAPS
25.0000 mg | ORAL_CAPSULE | Freq: Once | ORAL | Status: AC
  Administered 2015-12-12: 25 mg via ORAL

## 2015-12-12 MED ORDER — DIPHENHYDRAMINE HCL 25 MG PO CAPS
ORAL_CAPSULE | ORAL | Status: AC
Start: 2015-12-12 — End: 2015-12-12
  Filled 2015-12-12: qty 1

## 2015-12-12 NOTE — Patient Instructions (Signed)
 Blood Transfusion  A blood transfusion is a procedure in which you receive donated blood through an IV tube. You may need a blood transfusion because of illness, surgery, or injury. The blood may come from a donor, or it may be your own blood that you donated previously. The blood given in a transfusion is made up of different types of cells. You may receive:  Red blood cells. These carry oxygen and replace lost blood.  Platelets. These control bleeding.  Plasma. Thishelps blood to clot. If you have hemophilia or another clotting disorder, you may also receive other types of blood products. LET YOUR HEALTH CARE PROVIDER KNOW ABOUT:  Any allergies you have.  All medicines you are taking, including vitamins, herbs, eye drops, creams, and over-the-counter medicines.  Previous problems you or members of your family have had with the use of anesthetics.  Any blood disorders you have.  Previous surgeries you have had.  Any medical conditions you may have.  Any previous reactions you have had during a blood transfusion.  RISKS AND COMPLICATIONS Generally, this is a safe procedure. However, problems may occur, including:  Having an allergic reaction to something in the donated blood.  Fever. This may be a reaction to the white blood cells in the transfused blood.  Iron overload. This can happen from having many transfusions.  Transfusion-related acute lung injury (TRALI). This is a rare reaction that causes lung damage. The cause is not known.TRALI can occur within hours of a transfusion or several days later.  Sudden (acute) or delayed hemolytic reactions. This happens if your blood does not match the cells in your transfusion. Your body's defense system (immune system) may try to attack the new cells. This complication is rare.  Infection. This is rare. BEFORE THE PROCEDURE  You may have a blood test to determine your blood type. This is necessary to know what kind of blood  your body will accept.  If you are going to have a planned surgery, you may donate your own blood. This may be done in case you need to have a transfusion.  If you have had an allergic reaction to a transfusion in the past, you may be given medicine to help prevent a reaction. Take this medicine only as directed by your health care provider.  You will have your temperature, blood pressure, and pulse monitored before the transfusion. PROCEDURE   An IV will be started in your hand or arm.  The bag of donated blood will be attached to your IV tube and given into your vein.  Your temperature, blood pressure, and pulse will be monitored regularly during the transfusion. This monitoring is done to detect early signs of a transfusion reaction.  If you have any signs or symptoms of a reaction, your transfusion will be stopped and you may be given medicine.  When the transfusion is over, your IV will be removed.  Pressure may be applied to the IV site for a few minutes.  A bandage (dressing) will be applied. The procedure may vary among health care providers and hospitals. AFTER THE PROCEDURE  Your blood pressure, temperature, and pulse will be monitored regularly.   This information is not intended to replace advice given to you by your health care provider. Make sure you discuss any questions you have with your health care provider.   Document Released: 11/19/2000 Document Revised: 12/13/2014 Document Reviewed: 10/02/2014 Elsevier Interactive Patient Education 2016 Else[employer] Inc[employer]   Anemia, Nonspecific Anemia is a   condition in which the concentration of red blood cells or hemoglobin in the blood is below normal. Hemoglobin is a substance in red blood cells that carries oxygen to the tissues of the body. Anemia results in not enough oxygen reaching these tissues.  CAUSES  Common causes of anemia include:   Excessive bleeding. Bleeding may be internal or external. This includes excessive  bleeding from periods (in women) or from the intestine.   Poor nutrition.   Chronic kidney, thyroid, and liver disease.  Bone marrow disorders that decrease red blood cell production.  Cancer and treatments for cancer.  HIV, AIDS, and their treatments.  Spleen problems that increase red blood cell destruction.  Blood disorders.  Excess destruction of red blood cells due to infection, medicines, and autoimmune disorders. SIGNS AND SYMPTOMS   Minor weakness.   Dizziness.   Headache.  Palpitations.   Shortness of breath, especially with exercise.   Paleness.  Cold sensitivity.  Indigestion.  Nausea.  Difficulty sleeping.  Difficulty concentrating. Symptoms may occur suddenly or they may develop slowly.  DIAGNOSIS  Additional blood tests are often needed. These help your health care provider determine the best treatment. Your health care provider will check your stool for blood and look for other causes of blood loss.  TREATMENT  Treatment varies depending on the cause of the anemia. Treatment can include:   Supplements of iron, vitamin B12, or folic acid.   Hormone medicines.   A blood transfusion. This may be needed if blood loss is severe.   Hospitalization. This may be needed if there is significant continual blood loss.   Dietary changes.  Spleen removal. HOME CARE INSTRUCTIONS Keep all follow-up appointments. It often takes many weeks to correct anemia, and having your health care provider check on your condition and your response to treatment is very important. SEEK IMMEDIATE MEDICAL CARE IF:   You develop extreme weakness, shortness of breath, or chest pain.   You become dizzy or have trouble concentrating.  You develop heavy vaginal bleeding.   You develop a rash.   You have bloody or black, tarry stools.   You faint.   You vomit up blood.   You vomit repeatedly.   You have abdominal pain.  You have a fever or  persistent symptoms for more than 2-3 days.   You have a fever and your symptoms suddenly get worse.   You are dehydrated.  MAKE SURE YOU:  Understand these instructions.  Will watch your condition.  Will get help right away if you are not doing well or get worse.   This information is not intended to replace advice given to you by your health care provider. Make sure you discuss any questions you have with your health care provider.   Document Released: 12/30/2004 Document Revised: 07/25/2013 Document Reviewed: 05/18/2013 Elsevier Interactive Patient Education 2016 Elsevier Inc.  

## 2015-12-12 NOTE — Telephone Encounter (Signed)
per pof to sch pt blood-sent to MW to sch trmt-pt to get updated copy b4 leaving

## 2015-12-12 NOTE — Progress Notes (Signed)
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  the patient is seen here for further follow-up. Recently, he had recurrent fevers and chills. His initial blood culture was positive but then subsequent blood culture was negative. He undergoes significant calorie restriction and low sugar diet at the advise of  His son. He has lost 15 pounds in 3 months. He complained of weakness today. The patient denies any recent signs or symptoms of bleeding such as spontaneous epistaxis, hematuria or hematochezia.   REVIEW OF SYSTEMS:   Eyes: Denies blurriness of vision Ears, nose, mouth, throat, and face: Denies mucositis or sore throat Respiratory: Denies cough, dyspnea or wheezes Cardiovascular: Denies palpitation, chest discomfort or lower extremity swelling Gastrointestinal:  Denies nausea, heartburn or change in bowel habits Skin: Denies abnormal skin rashes Lymphatics: Denies new lymphadenopathy or easy bruising Neurological:Denies numbness, tingling or new weaknesses Behavioral/Psych: Mood is stable, no new changes  All other systems were reviewed  with the patient and are negative.  I have reviewed the past medical history, past surgical history, social history and family history with the patient and they are unchanged from previous note.  ALLERGIES:  has No Known Allergies.  MEDICATIONS:  Current Outpatient Prescriptions  Medication Sig Dispense Refill  . acetaminophen (TYLENOL) 500 MG tablet Take 1,000 mg by mouth every 6 (six) hours as needed for pain.    Marland Kitchen allopurinol (ZYLOPRIM) 100 MG tablet Take 50 mg by mouth daily.     Marland Kitchen ALPRAZolam (XANAX) 0.25 MG tablet Take 0.125 mg by mouth 2 (two) times daily as needed for anxiety.   0  . atorvastatin (LIPITOR) 20 MG tablet Take 1 tablet (20 mg total) by mouth daily. 90 tablet 3  . Calcium-Vitamin D (CALTRATE 600 PLUS-VIT D PO) Take 1 tablet by mouth 2 (two) times daily.     Marland Kitchen lisinopril-hydrochlorothiazide (PRINZIDE,ZESTORETIC) 10-12.5 MG per tablet Take 0.5 tablets by mouth daily. 45 tablet 2  . loratadine (CLARITIN) 10 MG tablet Take 10 mg by mouth daily.    . metoprolol (LOPRESSOR) 50 MG tablet take 1/2 tablet by mouth twice a day 30 tablet 11  . MULTIPLE VITAMINS-MINERALS PO Take 1 tablet by mouth 2 (two) times daily.    Marland Kitchen NITROSTAT 0.4 MG SL tablet Place 0.4 mg under the tongue every 5 (five) minutes as needed.   0  . Omega-3 Fatty Acids (FISH OIL) 1000 MG CAPS Take 2,000 mg by mouth 2 (two) times daily.    . ondansetron (ZOFRAN ODT) 4 MG disintegrating tablet '4mg'$  ODT q4 hours prn nausea/vomit 15 tablet 0  . predniSONE (DELTASONE) 5 MG tablet Take 7.5 mg by mouth daily.     . traMADol (ULTRAM) 50 MG tablet Take 25 mg by  mouth every 4 (four) hours as needed for pain.      No current facility-administered medications for this visit.    PHYSICAL EXAMINATION: ECOG PERFORMANCE STATUS: 2 - Symptomatic, <50% confined to bed  Filed Vitals:   12/12/15 0931  BP: 95/57  Pulse: 95  Temp: 98.2 F (36.8 C)  Resp: 19   Filed Weights   12/12/15 0931  Weight: 151 lb 8 oz (68.72 kg)     GENERAL:alert, no distress and comfortable SKIN: skin color, texture, turgor are normal, no rashes or significant lesions EYES: normal, Conjunctiva are pink and non-injected, sclera clear Musculoskeletal:no cyanosis of digits and no clubbing  NEURO: alert & oriented x 3 with fluent speech, no focal motor/sensory deficits  LABORATORY DATA:  I have reviewed the data as listed    Component Value Date/Time   NA 136 11/16/2015 2250   NA 138 05/30/2014 1138   K 3.7 11/16/2015 2250   K 3.7 05/30/2014 1138   CL 106 11/16/2015 2250   CL 102 04/09/2013 1347   CO2 20* 11/16/2015 2250   CO2 24 05/30/2014 1138   GLUCOSE 120* 11/16/2015 2250   GLUCOSE 147* 05/30/2014 1138   GLUCOSE 113* 04/09/2013 1347   BUN 16 11/16/2015 2250   BUN 26.2* 05/30/2014 1138   CREATININE 0.78 11/16/2015 2250   CREATININE 0.8 05/30/2014 1138   CALCIUM 8.6* 11/16/2015 2250   CALCIUM 9.8 05/30/2014 1138   PROT 6.9 11/16/2015 2250   PROT 7.4 11/22/2013 0847   ALBUMIN 2.7* 11/16/2015 2250   ALBUMIN 4.1 11/22/2013 0847   AST 26 11/16/2015 2250   AST 15 11/22/2013 0847   ALT 19 11/16/2015 2250   ALT 15 11/22/2013 0847   ALKPHOS 93 11/16/2015 2250   ALKPHOS 48 11/22/2013 0847   BILITOT 1.1 11/16/2015 2250   BILITOT 1.15 11/22/2013 0847   GFRNONAA >60 11/16/2015 2250   GFRAA >60 11/16/2015 2250    No results found for: SPEP, UPEP  Lab Results  Component Value Date   WBC 5.0 12/12/2015   NEUTROABS 3.1 12/12/2015   HGB 6.9* 12/12/2015   HCT 21.1* 12/12/2015   MCV 81.2 12/12/2015   PLT 25* 12/12/2015      Chemistry      Component Value Date/Time   NA 136 11/16/2015 2250   NA 138 05/30/2014 1138   K 3.7 11/16/2015 2250   K 3.7 05/30/2014 1138   CL 106 11/16/2015 2250   CL 102 04/09/2013 1347   CO2 20* 11/16/2015 2250   CO2 24 05/30/2014 1138   BUN 16 11/16/2015 2250   BUN 26.2* 05/30/2014 1138   CREATININE 0.78 11/16/2015 2250   CREATININE 0.8 05/30/2014 1138      Component Value  Date/Time   CALCIUM 8.6* 11/16/2015 2250   CALCIUM 9.8 05/30/2014 1138   ALKPHOS 93 11/16/2015 2250   ALKPHOS 48 11/22/2013 0847   AST 26 11/16/2015 2250   AST 15 11/22/2013 0847   ALT 19 11/16/2015 2250   ALT 15 11/22/2013 0847   BILITOT 1.1 11/16/2015 2250   BILITOT 1.15 11/22/2013 0847      ASSESSMENT & PLAN:  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 We discussed treatment options which are limited. The patient wants to preserve quality of life. At the time of discussion with the patient, he is in agreement to just receive palliative supportive care/transfusion support. In the meantime, we will continue blood transfusion support every 2 weeks to keep hemoglobin above 8  g.  Thrombocytopenia He has significant drop of his platelet count due to splenomegaly I suspect it could be a progression of his bone marrow disease. In the meantime, I told him to hold off taking aspirin therapy until his platelet count rebound to greater than 50,000. About from bruising, he is not symptomatic with bleeding. He does not require platelet transfusion today.   Protein calorie malnutrition ([place])  He has protein calorie malnutrition and 15 pound weight loss over the last 3 months because of low sugar diet.  I do not recommend the patient to go on calorie restriction or special diet in him. I am more concerned about his aggressive weight loss. I recommend he increase his oral intake as tolerated and not lose anymore weight.  Fever and chills He had recent recurrence of fevers and chills. Initial blood culture was positive but then came back negative which is suspect is related to contaminants. He does not need antibiotic treatment right now. I suspect his low-grade fever could be related to untreated myelofibrosis.   Orders Placed This Encounter  Procedures  . Hold Tube, Blood Bank    Standing Status: Standing     Number of Occurrences: 33     Standing  Expiration Date: 12/11/2016   All questions were answered. The patient knows to call the clinic with any problems, questions or concerns. No barriers to learning was detected. I spent 20 minutes counseling the patient face to face. The total time spent in the appointment was 25 minutes and more than 50% was on counseling and review of test results     [facility], [place], MD 12/12/2015 1:17 PM

## 2015-12-12 NOTE — Telephone Encounter (Signed)
Per staff message and POF I have scheduled appts. Advised scheduler of appts. JMW  

## 2015-12-12 NOTE — Assessment & Plan Note (Signed)
He has protein calorie malnutrition and 15 pound weight loss over the last 3 months because of low sugar diet.  I do not recommend the patient to go on calorie restriction or special diet in him. I am more concerned about his aggressive weight loss. I recommend he increase his oral intake as tolerated and not lose anymore weight.

## 2015-12-12 NOTE — Assessment & Plan Note (Signed)
He had recent recurrence of fevers and chills. Initial blood culture was positive but then came back negative which is suspect is related to contaminants. He does not need antibiotic treatment right now. I suspect his low-grade fever could be related to untreated myelofibrosis.

## 2015-12-12 NOTE — Assessment & Plan Note (Signed)
He has significant drop of his platelet count due to splenomegaly I suspect it could be a progression of his bone marrow disease. In the meantime, I told him to hold off taking aspirin therapy until his platelet count rebound to greater than 50,000. About from bruising, he is not symptomatic with bleeding. He does not require platelet transfusion today.

## 2015-12-12 NOTE — Assessment & Plan Note (Signed)
The patient is not a candidate for clinical trial. We discussed treatment options which are limited. The patient wants to preserve quality of life. At the time of discussion with the patient, he is in agreement to just receive palliative supportive care/transfusion support. In the meantime, we will continue blood transfusion support every 2 weeks to keep hemoglobin above 8 g.

## 2015-12-15 LAB — TYPE AND SCREEN
ABO/RH(D): O POS
ANTIBODY SCREEN: NEGATIVE
UNIT DIVISION: 0
Unit division: 0

## 2015-12-26 ENCOUNTER — Other Ambulatory Visit (HOSPITAL_BASED_OUTPATIENT_CLINIC_OR_DEPARTMENT_OTHER): Payer: Medicare Other

## 2015-12-26 ENCOUNTER — Ambulatory Visit (HOSPITAL_BASED_OUTPATIENT_CLINIC_OR_DEPARTMENT_OTHER): Payer: Medicare Other

## 2015-12-26 ENCOUNTER — Other Ambulatory Visit: Payer: Self-pay | Admitting: Hematology and Oncology

## 2015-12-26 ENCOUNTER — Other Ambulatory Visit: Payer: Medicare Other

## 2015-12-26 VITALS — BP 105/46 | HR 74 | Temp 98.1°F | Resp 18

## 2015-12-26 DIAGNOSIS — D471 Chronic myeloproliferative disease: Secondary | ICD-10-CM | POA: Diagnosis present

## 2015-12-26 DIAGNOSIS — D4620 Refractory anemia with excess of blasts, unspecified: Secondary | ICD-10-CM | POA: Diagnosis present

## 2015-12-26 DIAGNOSIS — D469 Myelodysplastic syndrome, unspecified: Secondary | ICD-10-CM | POA: Diagnosis not present

## 2015-12-26 DIAGNOSIS — D61818 Other pancytopenia: Secondary | ICD-10-CM

## 2015-12-26 LAB — CBC & DIFF AND RETIC
BASO%: 0.8 % (ref 0.0–2.0)
Basophils Absolute: 0 10*3/uL (ref 0.0–0.1)
EOS%: 0 % (ref 0.0–7.0)
Eosinophils Absolute: 0 10*3/uL (ref 0.0–0.5)
HEMATOCRIT: 20.9 % — AB (ref 38.4–49.9)
HGB: 6.9 g/dL — CL (ref 13.0–17.1)
Immature Retic Fract: 18.5 % — ABNORMAL HIGH (ref 3.00–10.60)
LYMPH%: 17 % (ref 14.0–49.0)
MCH: 26.3 pg — AB (ref 27.2–33.4)
MCHC: 33 g/dL (ref 32.0–36.0)
MCV: 79.8 fL (ref 79.3–98.0)
MONO#: 1.5 10*3/uL — ABNORMAL HIGH (ref 0.1–0.9)
MONO%: 29.7 % — AB (ref 0.0–14.0)
NEUT#: 2.7 10*3/uL (ref 1.5–6.5)
NEUT%: 52.5 % (ref 39.0–75.0)
PLATELETS: 22 10*3/uL — AB (ref 140–400)
RBC: 2.62 10*6/uL — ABNORMAL LOW (ref 4.20–5.82)
RDW: 17.4 % — ABNORMAL HIGH (ref 11.0–14.6)
RETIC CT ABS: 36.94 10*3/uL (ref 34.80–93.90)
Retic %: 1.41 % (ref 0.80–1.80)
WBC: 5.1 10*3/uL (ref 4.0–10.3)
lymph#: 0.9 10*3/uL (ref 0.9–3.3)
nRBC: 1 % — ABNORMAL HIGH (ref 0–0)

## 2015-12-26 LAB — TECHNOLOGIST REVIEW

## 2015-12-26 LAB — PREPARE RBC (CROSSMATCH)

## 2015-12-26 MED ORDER — ACETAMINOPHEN 325 MG PO TABS
ORAL_TABLET | ORAL | Status: AC
  Filled 2015-12-26: qty 2

## 2015-12-26 MED ORDER — DIPHENHYDRAMINE HCL 25 MG PO CAPS
25.0000 mg | ORAL_CAPSULE | Freq: Once | ORAL | Status: AC
  Administered 2015-12-26: 25 mg via ORAL

## 2015-12-26 MED ORDER — SODIUM CHLORIDE 0.9 % IV SOLN
250.0000 mL | Freq: Once | INTRAVENOUS | Status: AC
  Administered 2015-12-26: 250 mL via INTRAVENOUS

## 2015-12-26 MED ORDER — DIPHENHYDRAMINE HCL 25 MG PO CAPS
ORAL_CAPSULE | ORAL | Status: AC
  Filled 2015-12-26: qty 1

## 2015-12-26 MED ORDER — ACETAMINOPHEN 325 MG PO TABS
650.0000 mg | ORAL_TABLET | Freq: Once | ORAL | Status: AC
Start: 2015-12-26 — End: 2015-12-26
  Administered 2015-12-26: 650 mg via ORAL

## 2015-12-26 MED ORDER — SODIUM CHLORIDE 0.9 % IJ SOLN
3.0000 mL | INTRAMUSCULAR | Status: DC | PRN
  Filled 2015-12-26: qty 10

## 2015-12-26 NOTE — Progress Notes (Signed)
Pt tolerated 2units prbc transfusion today without any complaints. VSS remained stable. IV site deaccessed and is c/d/i dressing applied. Pritned pt AVS.

## 2015-12-26 NOTE — Patient Instructions (Signed)
 Blood Transfusion  A blood transfusion is a procedure in which you receive donated blood through an IV tube. You may need a blood transfusion because of illness, surgery, or injury. The blood may come from a donor, or it may be your own blood that you donated previously. The blood given in a transfusion is made up of different types of cells. You may receive:  Red blood cells. These carry oxygen and replace lost blood.  Platelets. These control bleeding.  Plasma. Thishelps blood to clot. If you have hemophilia or another clotting disorder, you may also receive other types of blood products. LET YOUR HEALTH CARE PROVIDER KNOW ABOUT:  Any allergies you have.  All medicines you are taking, including vitamins, herbs, eye drops, creams, and over-the-counter medicines.  Previous problems you or members of your family have had with the use of anesthetics.  Any blood disorders you have.  Previous surgeries you have had.  Any medical conditions you may have.  Any previous reactions you have had during a blood transfusion.  RISKS AND COMPLICATIONS Generally, this is a safe procedure. However, problems may occur, including:  Having an allergic reaction to something in the donated blood.  Fever. This may be a reaction to the white blood cells in the transfused blood.  Iron overload. This can happen from having many transfusions.  Transfusion-related acute lung injury (TRALI). This is a rare reaction that causes lung damage. The cause is not known.TRALI can occur within hours of a transfusion or several days later.  Sudden (acute) or delayed hemolytic reactions. This happens if your blood does not match the cells in your transfusion. Your body's defense system (immune system) may try to attack the new cells. This complication is rare.  Infection. This is rare. BEFORE THE PROCEDURE  You may have a blood test to determine your blood type. This is necessary to know what kind of blood  your body will accept.  If you are going to have a planned surgery, you may donate your own blood. This may be done in case you need to have a transfusion.  If you have had an allergic reaction to a transfusion in the past, you may be given medicine to help prevent a reaction. Take this medicine only as directed by your health care provider.  You will have your temperature, blood pressure, and pulse monitored before the transfusion. PROCEDURE   An IV will be started in your hand or arm.  The bag of donated blood will be attached to your IV tube and given into your vein.  Your temperature, blood pressure, and pulse will be monitored regularly during the transfusion. This monitoring is done to detect early signs of a transfusion reaction.  If you have any signs or symptoms of a reaction, your transfusion will be stopped and you may be given medicine.  When the transfusion is over, your IV will be removed.  Pressure may be applied to the IV site for a few minutes.  A bandage (dressing) will be applied. The procedure may vary among health care providers and hospitals. AFTER THE PROCEDURE  Your blood pressure, temperature, and pulse will be monitored regularly.   This information is not intended to replace advice given to you by your health care provider. Make sure you discuss any questions you have with your health care provider.   Document Released: 11/19/2000 Document Revised: 12/13/2014 Document Reviewed: 10/02/2014 Elsevier Interactive Patient Education 2016 Else[employer] Inc[employer]   Anemia, Nonspecific Anemia is a   condition in which the concentration of red blood cells or hemoglobin in the blood is below normal. Hemoglobin is a substance in red blood cells that carries oxygen to the tissues of the body. Anemia results in not enough oxygen reaching these tissues.  CAUSES  Common causes of anemia include:   Excessive bleeding. Bleeding may be internal or external. This includes excessive  bleeding from periods (in women) or from the intestine.   Poor nutrition.   Chronic kidney, thyroid, and liver disease.  Bone marrow disorders that decrease red blood cell production.  Cancer and treatments for cancer.  HIV, AIDS, and their treatments.  Spleen problems that increase red blood cell destruction.  Blood disorders.  Excess destruction of red blood cells due to infection, medicines, and autoimmune disorders. SIGNS AND SYMPTOMS   Minor weakness.   Dizziness.   Headache.  Palpitations.   Shortness of breath, especially with exercise.   Paleness.  Cold sensitivity.  Indigestion.  Nausea.  Difficulty sleeping.  Difficulty concentrating. Symptoms may occur suddenly or they may develop slowly.  DIAGNOSIS  Additional blood tests are often needed. These help your health care provider determine the best treatment. Your health care provider will check your stool for blood and look for other causes of blood loss.  TREATMENT  Treatment varies depending on the cause of the anemia. Treatment can include:   Supplements of iron, vitamin B12, or folic acid.   Hormone medicines.   A blood transfusion. This may be needed if blood loss is severe.   Hospitalization. This may be needed if there is significant continual blood loss.   Dietary changes.  Spleen removal. HOME CARE INSTRUCTIONS Keep all follow-up appointments. It often takes many weeks to correct anemia, and having your health care provider check on your condition and your response to treatment is very important. SEEK IMMEDIATE MEDICAL CARE IF:   You develop extreme weakness, shortness of breath, or chest pain.   You become dizzy or have trouble concentrating.  You develop heavy vaginal bleeding.   You develop a rash.   You have bloody or black, tarry stools.   You faint.   You vomit up blood.   You vomit repeatedly.   You have abdominal pain.  You have a fever or  persistent symptoms for more than 2-3 days.   You have a fever and your symptoms suddenly get worse.   You are dehydrated.  MAKE SURE YOU:  Understand these instructions.  Will watch your condition.  Will get help right away if you are not doing well or get worse.   This information is not intended to replace advice given to you by your health care provider. Make sure you discuss any questions you have with your health care provider.   Document Released: 12/30/2004 Document Revised: 07/25/2013 Document Reviewed: 05/18/2013 Elsevier Interactive Patient Education 2016 Elsevier Inc.  

## 2015-12-27 LAB — TYPE AND SCREEN
ABO/RH(D): O POS
Antibody Screen: NEGATIVE
Unit division: 0
Unit division: 0

## 2016-01-02 ENCOUNTER — Telehealth: Payer: Self-pay | Admitting: Hematology and Oncology

## 2016-01-02 ENCOUNTER — Telehealth: Payer: Self-pay | Admitting: *Deleted

## 2016-01-02 ENCOUNTER — Telehealth: Payer: Self-pay

## 2016-01-02 NOTE — Telephone Encounter (Signed)
Spoke with pt regarding new appt for Monday 1/30 at 12:45. Pt confirmed appt

## 2016-01-02 NOTE — Telephone Encounter (Signed)
Pt called to let us know he had fever last night up to 102, he took tylenol and it came down. He also had a fever day before yesterday.

## 2016-01-02 NOTE — Telephone Encounter (Signed)
Called patient- Dr Alvy Bimler wants him to take the antibiotics she prescribed- pt confirmed he has prescription at home.Dr Alvy Bimler wants him to have labs and see her on Monday. Wants him to go to the [facility] he starts feeling bad. Verbalized understanding. Pt states he is eating and drinking fine.

## 2016-01-05 ENCOUNTER — Telehealth: Payer: Self-pay | Admitting: *Deleted

## 2016-01-05 ENCOUNTER — Other Ambulatory Visit (HOSPITAL_BASED_OUTPATIENT_CLINIC_OR_DEPARTMENT_OTHER): Payer: Medicare Other

## 2016-01-05 ENCOUNTER — Ambulatory Visit (HOSPITAL_BASED_OUTPATIENT_CLINIC_OR_DEPARTMENT_OTHER): Payer: Medicare Other | Admitting: Hematology and Oncology

## 2016-01-05 ENCOUNTER — Encounter: Payer: Self-pay | Admitting: Hematology and Oncology

## 2016-01-05 VITALS — BP 103/49 | HR 63 | Temp 97.8°F | Resp 18 | Ht 67.0 in | Wt 152.5 lb

## 2016-01-05 DIAGNOSIS — D61818 Other pancytopenia: Secondary | ICD-10-CM

## 2016-01-05 DIAGNOSIS — D4620 Refractory anemia with excess of blasts, unspecified: Secondary | ICD-10-CM

## 2016-01-05 DIAGNOSIS — D630 Anemia in neoplastic disease: Secondary | ICD-10-CM

## 2016-01-05 DIAGNOSIS — R5081 Fever presenting with conditions classified elsewhere: Secondary | ICD-10-CM | POA: Diagnosis not present

## 2016-01-05 DIAGNOSIS — D709 Neutropenia, unspecified: Secondary | ICD-10-CM

## 2016-01-05 DIAGNOSIS — D469 Myelodysplastic syndrome, unspecified: Secondary | ICD-10-CM | POA: Diagnosis not present

## 2016-01-05 LAB — CBC & DIFF AND RETIC
BASO%: 0.8 % (ref 0.0–2.0)
Basophils Absolute: 0 10*3/uL (ref 0.0–0.1)
EOS ABS: 0 10*3/uL (ref 0.0–0.5)
EOS%: 0 % (ref 0.0–7.0)
HEMATOCRIT: 22 % — AB (ref 38.4–49.9)
HGB: 7.2 g/dL — ABNORMAL LOW (ref 13.0–17.1)
IMMATURE RETIC FRACT: 15.2 % — AB (ref 3.00–10.60)
LYMPH#: 0.9 10*3/uL (ref 0.9–3.3)
LYMPH%: 17 % (ref 14.0–49.0)
MCH: 26.9 pg — ABNORMAL LOW (ref 27.2–33.4)
MCHC: 32.7 g/dL (ref 32.0–36.0)
MCV: 82.1 fL (ref 79.3–98.0)
MONO#: 1.2 10*3/uL — AB (ref 0.1–0.9)
MONO%: 23.9 % — ABNORMAL HIGH (ref 0.0–14.0)
NEUT%: 58.3 % (ref 39.0–75.0)
NEUTROS ABS: 3 10*3/uL (ref 1.5–6.5)
PLATELETS: 22 10*3/uL — AB (ref 140–400)
RBC: 2.68 10*6/uL — AB (ref 4.20–5.82)
RDW: 17.9 % — ABNORMAL HIGH (ref 11.0–14.6)
RETIC %: 1.18 % (ref 0.80–1.80)
RETIC CT ABS: 31.62 10*3/uL — AB (ref 34.80–93.90)
WBC: 5.2 10*3/uL (ref 4.0–10.3)
nRBC: 1 % — ABNORMAL HIGH (ref 0–0)

## 2016-01-05 LAB — PREPARE RBC (CROSSMATCH)

## 2016-01-05 LAB — TECHNOLOGIST REVIEW

## 2016-01-05 NOTE — Telephone Encounter (Signed)
LVM for pt and wife pt is scheduled tomorrow morning at [facility] 8:30 am for blood transfusion.  Map and directions were given to pt/wife in office this morning.

## 2016-01-05 NOTE — Assessment & Plan Note (Addendum)
It is not yet time for blood transfusion but we checked it today because he is not been feeling well. We will proceed with blood transfusion tomorrow due to symptomatic anemia with fatigue and shortness of breath. We discussed some of the risks, benefits, and alternatives of blood transfusions. The patient is symptomatic from anemia and the hemoglobin level is critically low.  Some of the side-effects to be expected including risks of transfusion reactions, chills, infection, syndrome of volume overload and risk of hospitalization from various reasons and the patient is willing to proceed and went ahead to sign consent today. He will get 2 units of blood whenever hemoglobin is less than 8 He had mild hemoptysis but they are self limited. I recommend observation only and no need for platelet transfusion for now

## 2016-01-05 NOTE — Progress Notes (Signed)
[place] OFFICE PROGRESS NOTE  Patient Care Team: Jonathon Jordan, MD as PCP - General (Family Medicine) Ronald Lobo, MD (Gastroenterology) Jonathon Jordan, MD as Attending Physician (Family Medicine) Heath Lark, MD as Consulting Physician (Hematology and Oncology) Roxana Hires, MD as Consulting Physician (Hematology and Oncology)  SUMMARY OF ONCOLOGIC HISTORY: Oncology History   R-IPSS score 3.5     MDS (myelodysplastic syndrome), low grade ([place])   04/26/2013 Bone Marrow Biopsy BM biopsy confirmed MDS with multilineage dysplasia, 4% blast, normal cytogenetics.   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 He is seen today because he has been complaining of high-grade fever over the weekend. He has fever as high as 103 with occasional chills. He had occasional hemoptysis, cough and shortness of breath. Denies sore throat. No dysuria, frequency, urgency or diarrhea. He has no further weight loss since our last visit. He complained of significant fatigue. He denies hematuria or hematochezia.  REVIEW OF SYSTEMS:   Eyes: Denies blurriness of vision Ears, nose, mouth, throat, and face: Denies mucositis or sore throat Cardiovascular: Denies palpitation, chest discomfort or lower extremity swelling Gastrointestinal:  Denies nausea, heartburn or change in bowel habits Skin: Denies abnormal skin rashes Lymphatics: Denies new lymphadenopathy or easy bruising Neurological:Denies numbness, tingling or new weaknesses Behavioral/Psych: Mood is stable, no new changes  All other systems were reviewed with the patient and are negative.  I have reviewed the past medical history, past surgical history, social history  and family history with the patient and they are unchanged from previous note.  ALLERGIES:  has No Known Allergies.  MEDICATIONS:  Current Outpatient Prescriptions  Medication Sig Dispense Refill  . acetaminophen (TYLENOL) 500 MG tablet Take 1,000 mg by mouth every 6 (six) hours as needed for pain.    Marland Kitchen allopurinol (ZYLOPRIM) 100 MG tablet Take 50 mg by mouth daily.     Marland Kitchen ALPRAZolam (XANAX) 0.25 MG tablet Take 0.125 mg by mouth 2 (two) times daily as needed for anxiety.   0  . atorvastatin (LIPITOR) 20 MG tablet Take 1 tablet (20 mg total) by mouth daily. 90 tablet 3  . Calcium-Vitamin D (CALTRATE 600 PLUS-VIT D PO) Take 1 tablet by mouth 2 (two) times daily.     Marland Kitchen lisinopril-hydrochlorothiazide (PRINZIDE,ZESTORETIC) 10-12.5 MG per tablet Take 0.5 tablets by mouth daily. 45 tablet 2  . loratadine (CLARITIN) 10 MG tablet Take 10 mg by mouth daily.    . metoprolol (LOPRESSOR) 50 MG tablet take 1/2 tablet by mouth twice a day 30 tablet 11  . MULTIPLE VITAMINS-MINERALS PO Take 1 tablet by mouth 2 (two) times daily.    Marland Kitchen NITROSTAT 0.4 MG SL tablet Place 0.4 mg under the tongue every 5 (five) minutes as needed.   0  . Omega-3 Fatty Acids (FISH OIL) 1000 MG CAPS Take 2,000 mg by mouth 2 (two) times daily.    . predniSONE (DELTASONE) 5 MG tablet Take 7.5 mg by mouth daily.     . traMADol (ULTRAM) 50 MG tablet Take 25 mg by mouth every 4 (four) hours as needed for pain.      No current facility-administered medications for this visit.    PHYSICAL EXAMINATION: ECOG PERFORMANCE STATUS: 1 - Symptomatic but completely ambulatory  Filed  Vitals:   01/05/16 1239  BP: 103/49  Pulse: 63  Temp: 97.8 F (36.6 C)  Resp: 18   Filed Weights   01/05/16 1239  Weight: 152 lb 8 oz (69.174 kg)    GENERAL:alert, no distress and comfortable SKIN: skin color, texture, turgor are normal, no rashes or significant lesions. He looks pale EYES: normal, Conjunctiva are pink and non-injected, sclera  clear OROPHARYNX:no exudate, no erythema and lips, buccal mucosa, and tongue normal  NECK: supple, thyroid normal size, non-tender, without nodularity LYMPH:  no palpable lymphadenopathy in the cervical, axillary or inguinal LUNGS: clear to auscultation and percussion with normal breathing effort HEART: regular rate & rhythm and no murmurs and no lower extremity edema ABDOMEN:abdomen soft, non-tender and normal bowel sounds. He has palpable splenomegaly Musculoskeletal:no cyanosis of digits and no clubbing  NEURO: alert & oriented x 3 with fluent speech, no focal motor/sensory deficits  LABORATORY DATA:  I have reviewed the data as listed    Component Value Date/Time   NA 136 11/16/2015 2250   NA 138 05/30/2014 1138   K 3.7 11/16/2015 2250   K 3.7 05/30/2014 1138   CL 106 11/16/2015 2250   CL 102 04/09/2013 1347   CO2 20* 11/16/2015 2250   CO2 24 05/30/2014 1138   GLUCOSE 120* 11/16/2015 2250   GLUCOSE 147* 05/30/2014 1138   GLUCOSE 113* 04/09/2013 1347   BUN 16 11/16/2015 2250   BUN 26.2* 05/30/2014 1138   CREATININE 0.78 11/16/2015 2250   CREATININE 0.8 05/30/2014 1138   CALCIUM 8.6* 11/16/2015 2250   CALCIUM 9.8 05/30/2014 1138   PROT 6.9 11/16/2015 2250   PROT 7.4 11/22/2013 0847   ALBUMIN 2.7* 11/16/2015 2250   ALBUMIN 4.1 11/22/2013 0847   AST 26 11/16/2015 2250   AST 15 11/22/2013 0847   ALT 19 11/16/2015 2250   ALT 15 11/22/2013 0847   ALKPHOS 93 11/16/2015 2250   ALKPHOS 48 11/22/2013 0847   BILITOT 1.1 11/16/2015 2250   BILITOT 1.15 11/22/2013 0847   GFRNONAA >60 11/16/2015 2250   GFRAA >60 11/16/2015 2250    No results found for: SPEP, UPEP  Lab Results  Component Value Date   WBC 5.2 01/05/2016   NEUTROABS 3.0 01/05/2016   HGB 7.2* 01/05/2016   HCT 22.0* 01/05/2016   MCV 82.1 01/05/2016   PLT 22* 01/05/2016      Chemistry      Component Value Date/Time   NA 136 11/16/2015 2250   NA 138 05/30/2014 1138   K 3.7 11/16/2015 2250   K 3.7  05/30/2014 1138   CL 106 11/16/2015 2250   CL 102 04/09/2013 1347   CO2 20* 11/16/2015 2250   CO2 24 05/30/2014 1138   BUN 16 11/16/2015 2250   BUN 26.2* 05/30/2014 1138   CREATININE 0.78 11/16/2015 2250   CREATININE 0.8 05/30/2014 1138      Component Value Date/Time   CALCIUM 8.6* 11/16/2015 2250   CALCIUM 9.8 05/30/2014 1138   ALKPHOS 93 11/16/2015 2250   ALKPHOS 48 11/22/2013 0847   AST 26 11/16/2015 2250   AST 15 11/22/2013 0847   ALT 19 11/16/2015 2250   ALT 15 11/22/2013 0847   BILITOT 1.1 11/16/2015 2250   BILITOT 1.15 11/22/2013 0847       ASSESSMENT & PLAN:  MDS (myelodysplastic syndrome), low grade The patient is not a candidate for clinical trial. We discussed treatment options which are limited. The patient wants to preserve quality of life. At the  time of discussion with the patient, he is in agreement to just receive palliative supportive care/transfusion support. In the meantime, we will continue blood transfusion support every 2 weeks to keep hemoglobin above 8 g.    Pancytopenia, acquired ([place]) It is not yet time for blood transfusion but we checked it today because he is not been feeling well. We will proceed with blood transfusion tomorrow due to symptomatic anemia with fatigue and shortness of breath. We discussed some of the risks, benefits, and alternatives of blood transfusions. The patient is symptomatic from anemia and the hemoglobin level is critically low.  Some of the side-effects to be expected including risks of transfusion reactions, chills, infection, syndrome of volume overload and risk of hospitalization from various reasons and the patient is willing to proceed and went ahead to sign consent today. He will get 2 units of blood whenever hemoglobin is less than 8 He had mild hemoptysis but they are self limited. I recommend observation only and no need for platelet transfusion for now  Fever and neutropenia ([place]) He had intermittent fever  and chills. He is mildly neutropenic. The patient wants a conservative approach. He has started taking levofloxacin with improvement and less fever. Based on his symptoms, I suspect the source of infection is likely respiratory tract infection. We discussed aggressive management such as pan-culture and admission to the hospital for broad-spectrum IV antibiotics versus continue on levofloxacin and he wants to stay at home with oral antibiotic therapy only. I will reassess in the next visit   No orders of the defined types were placed in this encounter.   All questions were answered. The patient knows to call the clinic with any problems, questions or concerns. No barriers to learning was detected. I spent 25 minutes counseling the patient face to face. The total time spent in the appointment was 30 minutes and more than 50% was on counseling and review of test results     [facility], Brighid Koch, MD 01/05/2016 4:10 PM

## 2016-01-05 NOTE — Assessment & Plan Note (Signed)
The patient is not a candidate for clinical trial. We discussed treatment options which are limited. The patient wants to preserve quality of life. At the time of discussion with the patient, he is in agreement to just receive palliative supportive care/transfusion support. In the meantime, we will continue blood transfusion support every 2 weeks to keep hemoglobin above 8 g.

## 2016-01-05 NOTE — Assessment & Plan Note (Signed)
He had intermittent fever and chills. He is mildly neutropenic. The patient wants a conservative approach. He has started taking levofloxacin with improvement and less fever. Based on his symptoms, I suspect the source of infection is likely respiratory tract infection. We discussed aggressive management such as pan-culture and admission to the hospital for broad-spectrum IV antibiotics versus continue on levofloxacin and he wants to stay at home with oral antibiotic therapy only. I will reassess in the next visit

## 2016-01-06 ENCOUNTER — Telehealth: Payer: Self-pay

## 2016-01-06 ENCOUNTER — Ambulatory Visit (HOSPITAL_COMMUNITY)
Admission: RE | Admit: 2016-01-06 | Discharge: 2016-01-06 | Disposition: A | Discharge status: Home / Self Care | Payer: Medicare Other | Source: Ambulatory Visit | Attending: Hematology and Oncology | Admitting: Hematology and Oncology

## 2016-01-06 VITALS — BP 102/47 | HR 57 | Temp 97.3°F | Resp 20

## 2016-01-06 DIAGNOSIS — D469 Myelodysplastic syndrome, unspecified: Secondary | ICD-10-CM | POA: Diagnosis not present

## 2016-01-06 DIAGNOSIS — D630 Anemia in neoplastic disease: Secondary | ICD-10-CM

## 2016-01-06 MED ORDER — SODIUM CHLORIDE 0.9% FLUSH: 3.0000 mL | INTRAVENOUS | Status: DC | PRN

## 2016-01-06 MED ORDER — SODIUM CHLORIDE 0.9 % IV SOLN
250.0000 mL | Freq: Once | INTRAVENOUS | Status: AC
  Administered 2016-01-06: 250 mL via INTRAVENOUS

## 2016-01-06 MED ORDER — ACETAMINOPHEN 325 MG PO TABS
650.0000 mg | ORAL_TABLET | Freq: Once | ORAL | Status: AC
  Administered 2016-01-06: 650 mg via ORAL
  Filled 2016-01-06: qty 2

## 2016-01-06 MED ORDER — SODIUM CHLORIDE 0.9% FLUSH: 10.0000 mL | INTRAVENOUS | Status: DC | PRN

## 2016-01-06 MED ORDER — HEPARIN SOD (PORK) LOCK FLUSH 100 UNIT/ML IV SOLN: 500.0000 [IU] | Freq: Every day | INTRAVENOUS | Status: DC | PRN

## 2016-01-06 MED ORDER — DIPHENHYDRAMINE HCL 25 MG PO CAPS
25.0000 mg | ORAL_CAPSULE | Freq: Once | ORAL | Status: AC
  Administered 2016-01-06: 25 mg via ORAL
  Filled 2016-01-06: qty 1

## 2016-01-06 MED ORDER — HEPARIN SOD (PORK) LOCK FLUSH 100 UNIT/ML IV SOLN: 250.0000 [IU] | INTRAVENOUS | Status: DC | PRN

## 2016-01-06 NOTE — Procedures (Signed)
[facility]  Procedure Note  Eric [name] [id number] DOB: 02-12-1946 DOA: 01/06/2016   Dr. Alvy Bimler  Associated Diagnosis: D63.0  Procedure Note: IV started, pre meds given per order, 2 Units PRBC's transfused without difficulty.  IV flushed and removed.   Condition During Procedure: patient stable throughout transfusion   Condition at Discharge: Patient stable, wife at bedside.   [name], RN  [facility]

## 2016-01-06 NOTE — Telephone Encounter (Signed)
Pt called to clarify next appt. Done.

## 2016-01-07 ENCOUNTER — Ambulatory Visit (HOSPITAL_COMMUNITY)
Admission: RE | Admit: 2016-01-07 | Discharge: 2016-01-07 | Disposition: A | Discharge status: Home / Self Care | Payer: Medicare Other | Source: Ambulatory Visit | Attending: Hematology and Oncology | Admitting: Hematology and Oncology

## 2016-01-07 DIAGNOSIS — D469 Myelodysplastic syndrome, unspecified: Secondary | ICD-10-CM | POA: Insufficient documentation

## 2016-01-07 LAB — TYPE AND SCREEN
ABO/RH(D): O POS
Antibody Screen: NEGATIVE
UNIT DIVISION: 0
Unit division: 0

## 2016-01-09 ENCOUNTER — Other Ambulatory Visit: Payer: Self-pay | Admitting: *Deleted

## 2016-01-09 ENCOUNTER — Other Ambulatory Visit (HOSPITAL_BASED_OUTPATIENT_CLINIC_OR_DEPARTMENT_OTHER): Payer: Medicare Other

## 2016-01-09 DIAGNOSIS — D61818 Other pancytopenia: Secondary | ICD-10-CM

## 2016-01-09 DIAGNOSIS — D4620 Refractory anemia with excess of blasts, unspecified: Secondary | ICD-10-CM

## 2016-01-09 LAB — CBC & DIFF AND RETIC
BASO%: 1.1 % (ref 0.0–2.0)
Basophils Absolute: 0.1 10*3/uL (ref 0.0–0.1)
EOS ABS: 0 10*3/uL (ref 0.0–0.5)
EOS%: 0 % (ref 0.0–7.0)
HCT: 27 % — ABNORMAL LOW (ref 38.4–49.9)
HEMOGLOBIN: 9 g/dL — AB (ref 13.0–17.1)
Immature Retic Fract: 18.8 % — ABNORMAL HIGH (ref 3.00–10.60)
LYMPH%: 13.9 % — ABNORMAL LOW (ref 14.0–49.0)
MCH: 27.7 pg (ref 27.2–33.4)
MCHC: 33.3 g/dL (ref 32.0–36.0)
MCV: 83.1 fL (ref 79.3–98.0)
MONO#: 1.2 10*3/uL — AB (ref 0.1–0.9)
MONO%: 27.6 % — AB (ref 0.0–14.0)
NEUT%: 57.4 % (ref 39.0–75.0)
NEUTROS ABS: 2.5 10*3/uL (ref 1.5–6.5)
Platelets: 19 10*3/uL — ABNORMAL LOW (ref 140–400)
RBC: 3.25 10*6/uL — ABNORMAL LOW (ref 4.20–5.82)
RDW: 17 % — AB (ref 11.0–14.6)
RETIC %: 1.06 % (ref 0.80–1.80)
Retic Ct Abs: 34.45 10*3/uL — ABNORMAL LOW (ref 34.80–93.90)
WBC: 4.4 10*3/uL (ref 4.0–10.3)
lymph#: 0.6 10*3/uL — ABNORMAL LOW (ref 0.9–3.3)

## 2016-01-09 LAB — TECHNOLOGIST REVIEW

## 2016-01-09 MED ORDER — TRAZODONE HCL 100 MG PO TABS: 100.0000 mg | ORAL_TABLET | Freq: Every evening | ORAL | Status: DC | PRN

## 2016-01-09 NOTE — Telephone Encounter (Signed)
No need for transfusion today.  S/w pt in lobby and he is a little concerned about his Platelet count of 19.  Denies any bleeding. He also c/o not sleeping at night.  Taking 2 xanax 0.5 mg not helping.  Has back pain at night and taking tramadol.    Notified Dr. Alvy Bimler of above and she ordered Trazodone at [facility] and also to add pt for lab/transfusion next Friday 2/10.   Rx sent to [facility] and POF sent to add on appts next week.  Pt agrees w/ this plan.  Instructed him to call sooner if any bleeding or other problems/concerns before then.  He verbalized understanding.

## 2016-01-13 ENCOUNTER — Telehealth: Payer: Self-pay | Admitting: Hematology and Oncology

## 2016-01-13 NOTE — Telephone Encounter (Signed)
Spoke with patient re appointments for 2/10 - lab @ 8:30 pm - PRBC's at [facility] 9:30 am.

## 2016-01-16 ENCOUNTER — Other Ambulatory Visit: Payer: Self-pay | Admitting: Hematology and Oncology

## 2016-01-16 ENCOUNTER — Ambulatory Visit (HOSPITAL_COMMUNITY)
Admission: RE | Admit: 2016-01-16 | Discharge: 2016-01-16 | Disposition: A | Discharge status: Home / Self Care | Payer: Medicare Other | Source: Ambulatory Visit | Attending: Hematology and Oncology | Admitting: Hematology and Oncology

## 2016-01-16 ENCOUNTER — Other Ambulatory Visit (HOSPITAL_BASED_OUTPATIENT_CLINIC_OR_DEPARTMENT_OTHER): Payer: Medicare Other

## 2016-01-16 ENCOUNTER — Other Ambulatory Visit: Payer: Self-pay | Admitting: *Deleted

## 2016-01-16 VITALS — BP 100/40 | HR 72 | Temp 98.0°F | Resp 18

## 2016-01-16 DIAGNOSIS — D469 Myelodysplastic syndrome, unspecified: Secondary | ICD-10-CM | POA: Diagnosis present

## 2016-01-16 DIAGNOSIS — D61818 Other pancytopenia: Secondary | ICD-10-CM

## 2016-01-16 DIAGNOSIS — D4620 Refractory anemia with excess of blasts, unspecified: Secondary | ICD-10-CM | POA: Diagnosis not present

## 2016-01-16 LAB — CBC & DIFF AND RETIC
HCT: 24 % — ABNORMAL LOW (ref 38.4–49.9)
HGB: 7.7 g/dL — ABNORMAL LOW (ref 13.0–17.1)
Immature Retic Fract: 9.5 % (ref 3.00–10.60)
MCH: 26.9 pg — AB (ref 27.2–33.4)
MCHC: 32.2 g/dL (ref 32.0–36.0)
MCV: 83.5 fL (ref 79.3–98.0)
PLATELETS: 26 10*3/uL — AB (ref 140–400)
RBC: 2.87 10*6/uL — AB (ref 4.20–5.82)
RDW: 17 % — AB (ref 11.0–14.6)
Retic %: 0.69 % — ABNORMAL LOW (ref 0.80–1.80)
Retic Ct Abs: 19.8 10*3/uL — ABNORMAL LOW (ref 34.80–93.90)
WBC: 5.7 10*3/uL (ref 4.0–10.3)

## 2016-01-16 LAB — MANUAL DIFFERENTIAL
ALC: 1.5 10*3/uL (ref 0.9–3.3)
ANC (CHCC manual diff): 3.1 10*3/uL (ref 1.5–6.5)
BAND NEUTROPHILS: 12 % — AB (ref 0–10)
BLASTS: 3 % — AB (ref 0–0)
Basophil: 0 % (ref 0–2)
EOS%: 0 % (ref 0–7)
LYMPH: 27 % (ref 14–49)
MONO: 16 % — AB (ref 0–14)
Metamyelocytes: 10 % — ABNORMAL HIGH (ref 0–0)
Myelocytes: 11 % — ABNORMAL HIGH (ref 0–0)
NRBC: 0 % (ref 0–0)
OTHER CELL: 0 % (ref 0–0)
PLT EST: DECREASED
PROMYELO: 0 % (ref 0–0)
SEG: 21 % — AB (ref 38–77)
Variant Lymph: 0 % (ref 0–0)

## 2016-01-16 LAB — PREPARE RBC (CROSSMATCH)

## 2016-01-16 MED ORDER — SODIUM CHLORIDE 0.9 % IV SOLN
250.0000 mL | Freq: Once | INTRAVENOUS | Status: AC
  Administered 2016-01-16: 250 mL via INTRAVENOUS

## 2016-01-16 MED ORDER — ACETAMINOPHEN 325 MG PO TABS
650.0000 mg | ORAL_TABLET | Freq: Once | ORAL | Status: AC
  Administered 2016-01-16: 650 mg via ORAL
  Filled 2016-01-16: qty 2

## 2016-01-16 MED ORDER — HEPARIN SOD (PORK) LOCK FLUSH 100 UNIT/ML IV SOLN: 250.0000 [IU] | INTRAVENOUS | Status: DC | PRN

## 2016-01-16 MED ORDER — DIPHENHYDRAMINE HCL 25 MG PO CAPS
25.0000 mg | ORAL_CAPSULE | Freq: Once | ORAL | Status: AC
  Administered 2016-01-16: 25 mg via ORAL
  Filled 2016-01-16: qty 1

## 2016-01-16 MED ORDER — SODIUM CHLORIDE 0.9% FLUSH: 3.0000 mL | INTRAVENOUS | Status: DC | PRN

## 2016-01-16 MED ORDER — SODIUM CHLORIDE 0.9% FLUSH: 10.0000 mL | INTRAVENOUS | Status: DC | PRN

## 2016-01-16 MED ORDER — HEPARIN SOD (PORK) LOCK FLUSH 100 UNIT/ML IV SOLN: 500.0000 [IU] | Freq: Every day | INTRAVENOUS | Status: DC | PRN

## 2016-01-16 NOTE — Progress Notes (Signed)
[facility]  Procedure Note  Eric [name] [id number] DOB: January 20, 1946 DOA: 01/16/2016   Dr. Alvy Bimler  Associated Diagnosis: D63.0  Procedure Note: IV started, pre meds given per order, 2 Units PRBC's transfused without difficulty. IV flushed and removed.   Condition During Procedure: patient stable throughout transfusion   Condition at Discharge: Patient stable, wife at bedside.  Harden Mo, RN [facility]

## 2016-01-18 LAB — TYPE AND SCREEN
ABO/RH(D): O POS
ANTIBODY SCREEN: NEGATIVE
UNIT DIVISION: 0
Unit division: 0

## 2016-01-23 ENCOUNTER — Ambulatory Visit: Payer: Medicare Other

## 2016-01-23 ENCOUNTER — Other Ambulatory Visit (HOSPITAL_BASED_OUTPATIENT_CLINIC_OR_DEPARTMENT_OTHER): Payer: Medicare Other

## 2016-01-23 DIAGNOSIS — D4620 Refractory anemia with excess of blasts, unspecified: Secondary | ICD-10-CM

## 2016-01-23 DIAGNOSIS — D61818 Other pancytopenia: Secondary | ICD-10-CM

## 2016-01-23 LAB — CBC & DIFF AND RETIC
BASO%: 0.6 % (ref 0.0–2.0)
Basophils Absolute: 0 10*3/uL (ref 0.0–0.1)
EOS ABS: 0 10*3/uL (ref 0.0–0.5)
EOS%: 0 % (ref 0.0–7.0)
HEMATOCRIT: 26.2 % — AB (ref 38.4–49.9)
HEMOGLOBIN: 8.7 g/dL — AB (ref 13.0–17.1)
IMMATURE RETIC FRACT: 12.3 % — AB (ref 3.00–10.60)
LYMPH%: 17.8 % (ref 14.0–49.0)
MCH: 28 pg (ref 27.2–33.4)
MCHC: 33.2 g/dL (ref 32.0–36.0)
MCV: 84.2 fL (ref 79.3–98.0)
MONO#: 1.4 10*3/uL — ABNORMAL HIGH (ref 0.1–0.9)
MONO%: 26.4 % — AB (ref 0.0–14.0)
NEUT#: 3 10*3/uL (ref 1.5–6.5)
NEUT%: 55.2 % (ref 39.0–75.0)
Platelets: 25 10*3/uL — ABNORMAL LOW (ref 140–400)
RBC: 3.11 10*6/uL — ABNORMAL LOW (ref 4.20–5.82)
RDW: 17 % — AB (ref 11.0–14.6)
RETIC %: 0.9 % (ref 0.80–1.80)
Retic Ct Abs: 27.99 10*3/uL — ABNORMAL LOW (ref 34.80–93.90)
WBC: 5.3 10*3/uL (ref 4.0–10.3)
lymph#: 1 10*3/uL (ref 0.9–3.3)

## 2016-01-23 LAB — TECHNOLOGIST REVIEW: Technologist Review: 1

## 2016-01-23 NOTE — Progress Notes (Signed)
Patient did not meet treatment parameters. HGB 8.7. Patient asymptomatic and instructed to call with any concerns. Patient did relay that he continues to have a clear productive cough. Cameo RN made aware.

## 2016-01-27 IMAGING — DX DG CHEST 2V
2 series · 2 of 2 positions shown · non-contrast
Comparison: 06/03/2015

CLINICAL DATA: Chills earlier today with low body temperature.
Subsequent found to have elevated temperature. History of
myelodysplastic syndrome.

EXAM:
CHEST  2 VIEW

[chest pa]
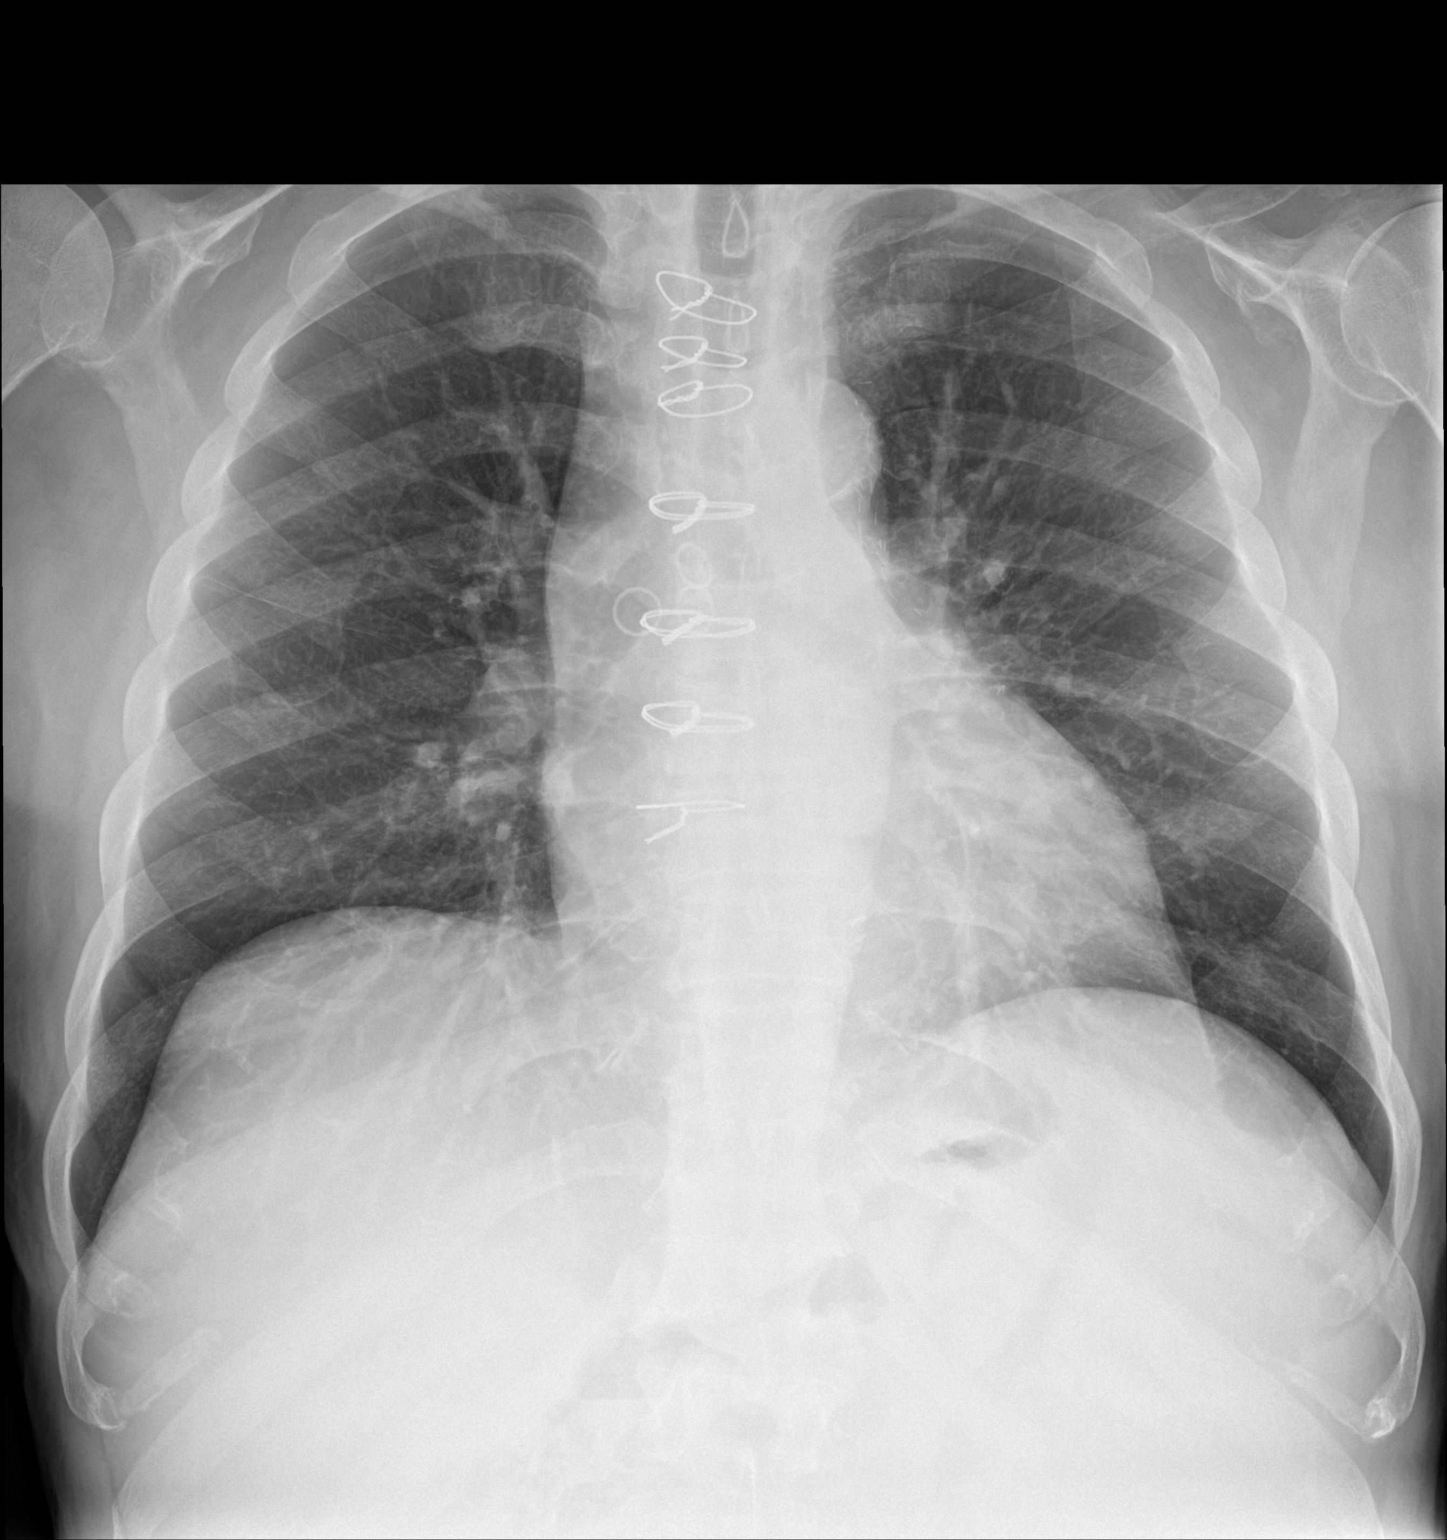

[chest lat]
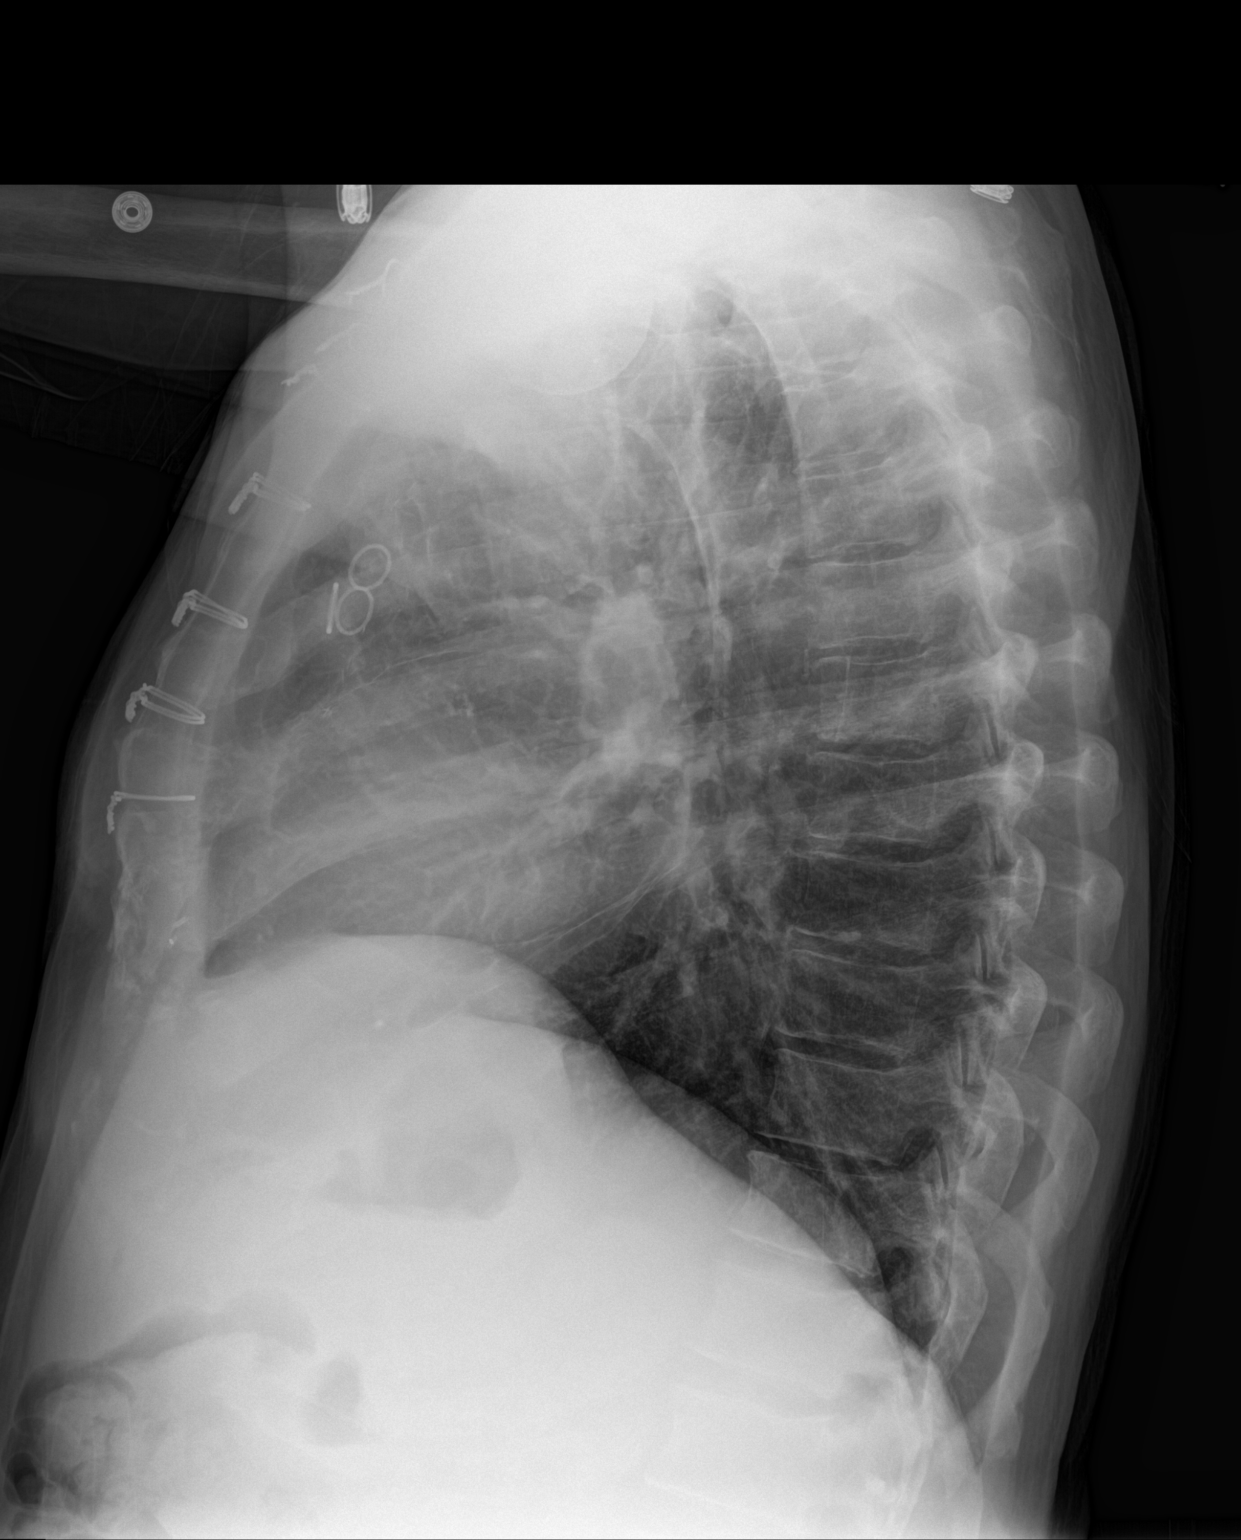

[2 of 2 positions shown; findings below may reference images not displayed]

FINDINGS: Postoperative changes in the mediastinum. Normal heart size and
pulmonary vascularity. No focal airspace disease or consolidation in
the lungs. No blunting of costophrenic angles. No pneumothorax.
Mediastinal contours appear intact. Calcification of the aorta.
IMPRESSION: No active cardiopulmonary disease.

## 2016-01-30 ENCOUNTER — Ambulatory Visit (HOSPITAL_BASED_OUTPATIENT_CLINIC_OR_DEPARTMENT_OTHER): Payer: Medicare Other

## 2016-01-30 ENCOUNTER — Other Ambulatory Visit: Payer: Self-pay | Admitting: *Deleted

## 2016-01-30 VITALS — BP 98/56 | HR 66 | Temp 99.2°F | Resp 16

## 2016-01-30 DIAGNOSIS — D469 Myelodysplastic syndrome, unspecified: Secondary | ICD-10-CM

## 2016-01-30 DIAGNOSIS — D4620 Refractory anemia with excess of blasts, unspecified: Secondary | ICD-10-CM

## 2016-01-30 LAB — CBC WITH DIFFERENTIAL/PLATELET
BASO%: 0.4 % (ref 0.0–2.0)
BASOS ABS: 0 10*3/uL (ref 0.0–0.1)
EOS%: 0 % (ref 0.0–7.0)
Eosinophils Absolute: 0 10*3/uL (ref 0.0–0.5)
HEMATOCRIT: 21.2 % — AB (ref 38.4–49.9)
HGB: 7 g/dL — ABNORMAL LOW (ref 13.0–17.1)
LYMPH#: 0.8 10*3/uL — AB (ref 0.9–3.3)
LYMPH%: 15.4 % (ref 14.0–49.0)
MCH: 27.2 pg (ref 27.2–33.4)
MCHC: 33 g/dL (ref 32.0–36.0)
MCV: 82.5 fL (ref 79.3–98.0)
MONO#: 1.3 10*3/uL — ABNORMAL HIGH (ref 0.1–0.9)
MONO%: 26.8 % — ABNORMAL HIGH (ref 0.0–14.0)
NEUT%: 57.4 % (ref 39.0–75.0)
NEUTROS ABS: 2.8 10*3/uL (ref 1.5–6.5)
NRBC: 1 % — AB (ref 0–0)
PLATELETS: 22 10*3/uL — AB (ref 140–400)
RBC: 2.57 10*6/uL — AB (ref 4.20–5.82)
RDW: 17.1 % — ABNORMAL HIGH (ref 11.0–14.6)
WBC: 4.9 10*3/uL (ref 4.0–10.3)

## 2016-01-30 LAB — TECHNOLOGIST REVIEW

## 2016-01-30 LAB — PREPARE RBC (CROSSMATCH)

## 2016-01-30 MED ORDER — SODIUM CHLORIDE 0.9 % IV SOLN
250.0000 mL | Freq: Once | INTRAVENOUS | Status: AC
  Administered 2016-01-30: 250 mL via INTRAVENOUS

## 2016-01-30 MED ORDER — ACETAMINOPHEN 325 MG PO TABS
ORAL_TABLET | ORAL | Status: AC
  Filled 2016-01-30: qty 2

## 2016-01-30 MED ORDER — DIPHENHYDRAMINE HCL 25 MG PO CAPS
25.0000 mg | ORAL_CAPSULE | Freq: Once | ORAL | Status: AC
  Administered 2016-01-30: 25 mg via ORAL

## 2016-01-30 MED ORDER — ACETAMINOPHEN 325 MG PO TABS
650.0000 mg | ORAL_TABLET | Freq: Once | ORAL | Status: AC
  Administered 2016-01-30: 650 mg via ORAL

## 2016-01-30 MED ORDER — DIPHENHYDRAMINE HCL 25 MG PO CAPS
ORAL_CAPSULE | ORAL | Status: AC
  Filled 2016-01-30: qty 1

## 2016-01-30 NOTE — Patient Instructions (Signed)
Blood Transfusion   A blood transfusion is a procedure that gives you donated blood through an IV tube. You may need blood because of illness, surgery, or injury. The blood may come from a donor. The blood may also be your own blood that you donated earlier.  The blood you get is made up of different types of cells. You may get:    Red blood cells. These carry oxygen and replace lost blood.    Platelets. These control bleeding.    Plasma. This helps blood to clot.  If you have a clotting disorder, you may also get other types of blood products.   BEFORE THE PROCEDURE   You may have a blood test. This finds out what type of blood you have. It also finds out what kind of blood your body will accept.    If you are going to have a planned surgery, you may donate your own blood. This is done in case you need to have a transfusion.    If you have had an allergic transfusion reaction before, you may be given medicine to help prevent a reaction. Take this medicine only as told by your doctor.   You will have your temperature, blood pressure, and pulse checked.  PROCEDURE    An IV will be started in your hand or arm.    The bag of donated blood will be attached to your IV and run into your vein.    A doctor will regularly check your temperature, blood pressure, and pulse during the procedure. This is done to find any early signs of a transfusion reaction.   If you have any signs or symptoms of a reaction, the procedure may be stopped and you may be given medicine.    When the transfusion is over, your IV will be removed.    Pressure may be applied to the IV site for a few minutes.    A bandage (dressing) will be applied.   The procedure may vary among doctors and hospitals.   AFTER THE PROCEDURE   Your blood pressure, temperature, and pulse will be checked regularly.     This information is not intended to replace advice given to you by your health care provider. Make sure you discuss any questions  you have with your health care provider.     Document Released: 02/18/2009 Document Revised: 12/13/2014 Document Reviewed: 10/02/2014  Elsevier Interactive Patient Education 2016 [employer]

## 2016-02-02 LAB — TYPE AND SCREEN
ABO/RH(D): O POS
Antibody Screen: NEGATIVE
UNIT DIVISION: 0
UNIT DIVISION: 0

## 2016-02-04 ENCOUNTER — Ambulatory Visit (HOSPITAL_COMMUNITY)
Admission: RE | Admit: 2016-02-04 | Discharge: 2016-02-04 | Disposition: A | Discharge status: Home / Self Care | Payer: Medicare Other | Source: Ambulatory Visit | Attending: Hematology and Oncology | Admitting: Hematology and Oncology

## 2016-02-04 ENCOUNTER — Telehealth: Payer: Self-pay | Admitting: Hematology and Oncology

## 2016-02-04 ENCOUNTER — Other Ambulatory Visit: Payer: Self-pay | Admitting: *Deleted

## 2016-02-04 ENCOUNTER — Telehealth: Payer: Self-pay | Admitting: *Deleted

## 2016-02-04 DIAGNOSIS — D469 Myelodysplastic syndrome, unspecified: Secondary | ICD-10-CM | POA: Insufficient documentation

## 2016-02-04 NOTE — Telephone Encounter (Signed)
Per staff message and POF I have scheduled appts. Advised scheduler of appts. JMW  

## 2016-02-04 NOTE — Telephone Encounter (Signed)
per pof to sch pt appt-sent MW email to sch blood-pt to get updated copy 3/3

## 2016-02-06 ENCOUNTER — Other Ambulatory Visit (HOSPITAL_BASED_OUTPATIENT_CLINIC_OR_DEPARTMENT_OTHER): Payer: Medicare Other

## 2016-02-06 DIAGNOSIS — D4620 Refractory anemia with excess of blasts, unspecified: Secondary | ICD-10-CM

## 2016-02-06 DIAGNOSIS — D61818 Other pancytopenia: Secondary | ICD-10-CM

## 2016-02-06 LAB — CBC & DIFF AND RETIC
BASO%: 0.7 % (ref 0.0–2.0)
BASOS ABS: 0 10*3/uL (ref 0.0–0.1)
EOS%: 0 % (ref 0.0–7.0)
Eosinophils Absolute: 0 10*3/uL (ref 0.0–0.5)
HCT: 25.6 % — ABNORMAL LOW (ref 38.4–49.9)
HGB: 8.6 g/dL — ABNORMAL LOW (ref 13.0–17.1)
Immature Retic Fract: 14.8 % — ABNORMAL HIGH (ref 3.00–10.60)
LYMPH#: 1.3 10*3/uL (ref 0.9–3.3)
LYMPH%: 24 % (ref 14.0–49.0)
MCH: 28.3 pg (ref 27.2–33.4)
MCHC: 33.6 g/dL (ref 32.0–36.0)
MCV: 84.2 fL (ref 79.3–98.0)
MONO#: 0.8 10*3/uL (ref 0.1–0.9)
MONO%: 15.4 % — ABNORMAL HIGH (ref 0.0–14.0)
NEUT#: 3.2 10*3/uL (ref 1.5–6.5)
NEUT%: 59.9 % (ref 39.0–75.0)
Platelets: 20 10*3/uL — ABNORMAL LOW (ref 140–400)
RBC: 3.04 10*6/uL — AB (ref 4.20–5.82)
RDW: 16.6 % — AB (ref 11.0–14.6)
RETIC CT ABS: 36.18 10*3/uL (ref 34.80–93.90)
Retic %: 1.19 % (ref 0.80–1.80)
WBC: 5.4 10*3/uL (ref 4.0–10.3)

## 2016-02-06 LAB — TECHNOLOGIST REVIEW

## 2016-02-13 ENCOUNTER — Other Ambulatory Visit (HOSPITAL_BASED_OUTPATIENT_CLINIC_OR_DEPARTMENT_OTHER): Payer: Medicare Other

## 2016-02-13 ENCOUNTER — Ambulatory Visit (HOSPITAL_BASED_OUTPATIENT_CLINIC_OR_DEPARTMENT_OTHER): Payer: Medicare Other

## 2016-02-13 ENCOUNTER — Other Ambulatory Visit: Payer: Self-pay | Admitting: Hematology and Oncology

## 2016-02-13 VITALS — BP 99/52 | HR 64 | Temp 97.6°F | Resp 18

## 2016-02-13 DIAGNOSIS — D4620 Refractory anemia with excess of blasts, unspecified: Secondary | ICD-10-CM | POA: Diagnosis not present

## 2016-02-13 DIAGNOSIS — D469 Myelodysplastic syndrome, unspecified: Secondary | ICD-10-CM

## 2016-02-13 DIAGNOSIS — D61818 Other pancytopenia: Secondary | ICD-10-CM

## 2016-02-13 LAB — CBC & DIFF AND RETIC
BASO%: 0.7 % (ref 0.0–2.0)
BASOS ABS: 0 10*3/uL (ref 0.0–0.1)
EOS ABS: 0 10*3/uL (ref 0.0–0.5)
EOS%: 0 % (ref 0.0–7.0)
HEMATOCRIT: 21.1 % — AB (ref 38.4–49.9)
HEMOGLOBIN: 7 g/dL — AB (ref 13.0–17.1)
IMMATURE RETIC FRACT: 15.7 % — AB (ref 3.00–10.60)
LYMPH%: 23.3 % (ref 14.0–49.0)
MCH: 27.6 pg (ref 27.2–33.4)
MCHC: 33.2 g/dL (ref 32.0–36.0)
MCV: 83.1 fL (ref 79.3–98.0)
MONO#: 0.7 10*3/uL (ref 0.1–0.9)
MONO%: 16.6 % — AB (ref 0.0–14.0)
NEUT#: 2.7 10*3/uL (ref 1.5–6.5)
NEUT%: 59.4 % (ref 39.0–75.0)
NRBC: 1 % — AB (ref 0–0)
Platelets: 23 10*3/uL — ABNORMAL LOW (ref 140–400)
RBC: 2.54 10*6/uL — ABNORMAL LOW (ref 4.20–5.82)
RDW: 16.8 % — AB (ref 11.0–14.6)
Retic %: 1.31 % (ref 0.80–1.80)
Retic Ct Abs: 33.27 10*3/uL — ABNORMAL LOW (ref 34.80–93.90)
WBC: 4.5 10*3/uL (ref 4.0–10.3)
lymph#: 1 10*3/uL (ref 0.9–3.3)

## 2016-02-13 LAB — PREPARE RBC (CROSSMATCH)

## 2016-02-13 LAB — TECHNOLOGIST REVIEW

## 2016-02-13 MED ORDER — ACETAMINOPHEN 325 MG PO TABS
650.0000 mg | ORAL_TABLET | Freq: Once | ORAL | Status: AC
  Administered 2016-02-13: 650 mg via ORAL

## 2016-02-13 MED ORDER — SODIUM CHLORIDE 0.9 % IV SOLN
250.0000 mL | Freq: Once | INTRAVENOUS | Status: AC
  Administered 2016-02-13: 250 mL via INTRAVENOUS

## 2016-02-13 MED ORDER — ACETAMINOPHEN 325 MG PO TABS
ORAL_TABLET | ORAL | Status: AC
  Filled 2016-02-13: qty 2

## 2016-02-13 MED ORDER — DIPHENHYDRAMINE HCL 25 MG PO CAPS
ORAL_CAPSULE | ORAL | Status: AC
  Filled 2016-02-13: qty 1

## 2016-02-13 MED ORDER — DIPHENHYDRAMINE HCL 25 MG PO CAPS
25.0000 mg | ORAL_CAPSULE | Freq: Once | ORAL | Status: AC
  Administered 2016-02-13: 25 mg via ORAL

## 2016-02-13 NOTE — Patient Instructions (Signed)

## 2016-02-16 LAB — TYPE AND SCREEN
ABO/RH(D): O POS
ANTIBODY SCREEN: NEGATIVE
Unit division: 0
Unit division: 0

## 2016-02-20 ENCOUNTER — Other Ambulatory Visit (HOSPITAL_BASED_OUTPATIENT_CLINIC_OR_DEPARTMENT_OTHER): Payer: Medicare Other

## 2016-02-20 ENCOUNTER — Ambulatory Visit (HOSPITAL_BASED_OUTPATIENT_CLINIC_OR_DEPARTMENT_OTHER): Payer: Medicare Other

## 2016-02-20 VITALS — BP 110/45 | HR 70 | Temp 98.1°F | Resp 18

## 2016-02-20 DIAGNOSIS — D469 Myelodysplastic syndrome, unspecified: Secondary | ICD-10-CM | POA: Diagnosis not present

## 2016-02-20 DIAGNOSIS — D61818 Other pancytopenia: Secondary | ICD-10-CM

## 2016-02-20 DIAGNOSIS — D4620 Refractory anemia with excess of blasts, unspecified: Secondary | ICD-10-CM | POA: Diagnosis present

## 2016-02-20 LAB — MANUAL DIFFERENTIAL
ALC: 0.8 10*3/uL — AB (ref 0.9–3.3)
ANC (CHCC manual diff): 2.8 10*3/uL (ref 1.5–6.5)
BAND NEUTROPHILS: 11 % — AB (ref 0–10)
BLASTS: 0 % (ref 0–0)
Basophil: 0 % (ref 0–2)
EOS%: 0 % (ref 0–7)
LYMPH: 18 % (ref 14–49)
MONO: 17 % — ABNORMAL HIGH (ref 0–14)
Metamyelocytes: 8 % — ABNORMAL HIGH (ref 0–0)
Myelocytes: 7 % — ABNORMAL HIGH (ref 0–0)
NRBC: 1 % — AB (ref 0–0)
Other Cell: 0 % (ref 0–0)
PLT EST: DECREASED
PROMYELO: 0 % (ref 0–0)
SEG: 39 % (ref 38–77)
VARIANT LYMPH: 0 % (ref 0–0)

## 2016-02-20 LAB — CBC & DIFF AND RETIC
HEMATOCRIT: 23.9 % — AB (ref 38.4–49.9)
HEMOGLOBIN: 7.8 g/dL — AB (ref 13.0–17.1)
IMMATURE RETIC FRACT: 13.9 % — AB (ref 3.00–10.60)
MCH: 27.6 pg (ref 27.2–33.4)
MCHC: 32.6 g/dL (ref 32.0–36.0)
MCV: 84.5 fL (ref 79.3–98.0)
PLATELETS: 18 10*3/uL — AB (ref 140–400)
RBC: 2.83 10*6/uL — ABNORMAL LOW (ref 4.20–5.82)
RDW: 16.3 % — AB (ref 11.0–14.6)
Retic %: 1.21 % (ref 0.80–1.80)
Retic Ct Abs: 34.24 10*3/uL — ABNORMAL LOW (ref 34.80–93.90)
WBC: 4.3 10*3/uL (ref 4.0–10.3)

## 2016-02-20 LAB — PREPARE RBC (CROSSMATCH)

## 2016-02-20 MED ORDER — ACETAMINOPHEN 325 MG PO TABS
ORAL_TABLET | ORAL | Status: AC
  Filled 2016-02-20: qty 2

## 2016-02-20 MED ORDER — ACETAMINOPHEN 325 MG PO TABS
650.0000 mg | ORAL_TABLET | Freq: Once | ORAL | Status: AC
  Administered 2016-02-20: 650 mg via ORAL

## 2016-02-20 MED ORDER — DIPHENHYDRAMINE HCL 25 MG PO CAPS
25.0000 mg | ORAL_CAPSULE | Freq: Once | ORAL | Status: AC
Start: 2016-02-20 — End: 2016-02-20
  Administered 2016-02-20: 25 mg via ORAL

## 2016-02-20 MED ORDER — DIPHENHYDRAMINE HCL 25 MG PO CAPS
ORAL_CAPSULE | ORAL | Status: AC
  Filled 2016-02-20: qty 1

## 2016-02-20 MED ORDER — SODIUM CHLORIDE 0.9 % IV SOLN: 250.0000 mL | Freq: Once | INTRAVENOUS | Status: AC

## 2016-02-20 NOTE — Patient Instructions (Signed)

## 2016-02-23 LAB — TYPE AND SCREEN
ABO/RH(D): O POS
ANTIBODY SCREEN: NEGATIVE
UNIT DIVISION: 0
Unit division: 0

## 2016-02-27 ENCOUNTER — Ambulatory Visit: Payer: Medicare Other

## 2016-02-27 ENCOUNTER — Other Ambulatory Visit (HOSPITAL_BASED_OUTPATIENT_CLINIC_OR_DEPARTMENT_OTHER): Payer: Medicare Other

## 2016-02-27 DIAGNOSIS — D4620 Refractory anemia with excess of blasts, unspecified: Secondary | ICD-10-CM | POA: Diagnosis present

## 2016-02-27 DIAGNOSIS — D61818 Other pancytopenia: Secondary | ICD-10-CM

## 2016-02-27 LAB — CBC & DIFF AND RETIC
BASO%: 0.4 % (ref 0.0–2.0)
Basophils Absolute: 0 10*3/uL (ref 0.0–0.1)
EOS%: 0 % (ref 0.0–7.0)
Eosinophils Absolute: 0 10*3/uL (ref 0.0–0.5)
HCT: 27 % — ABNORMAL LOW (ref 38.4–49.9)
HEMOGLOBIN: 8.9 g/dL — AB (ref 13.0–17.1)
IMMATURE RETIC FRACT: 16.6 % — AB (ref 3.00–10.60)
LYMPH%: 18.4 % (ref 14.0–49.0)
MCH: 28.1 pg (ref 27.2–33.4)
MCHC: 33 g/dL (ref 32.0–36.0)
MCV: 85.2 fL (ref 79.3–98.0)
MONO#: 1.1 10*3/uL — AB (ref 0.1–0.9)
MONO%: 23.9 % — AB (ref 0.0–14.0)
NEUT#: 2.6 10*3/uL (ref 1.5–6.5)
NEUT%: 57.3 % (ref 39.0–75.0)
RBC: 3.17 10*6/uL — ABNORMAL LOW (ref 4.20–5.82)
RDW: 15.7 % — ABNORMAL HIGH (ref 11.0–14.6)
Retic %: 1.02 % (ref 0.80–1.80)
Retic Ct Abs: 32.33 10*3/uL — ABNORMAL LOW (ref 34.80–93.90)
WBC: 4.6 10*3/uL (ref 4.0–10.3)
lymph#: 0.8 10*3/uL — ABNORMAL LOW (ref 0.9–3.3)

## 2016-02-27 LAB — TECHNOLOGIST REVIEW: Technologist Review: 2

## 2016-02-27 NOTE — Progress Notes (Signed)
Pt reports that he feels good and does not think he needs any transfusion today. Per Dr. Calton Dach note on 01/05/16 pt to receive 2 units if hemoglobin is less than 8. Today's hemoglobin was 8.9, pt aware and Cameo RN aware, no need for blood transfusion at this time. Pt discharged in stable condition.

## 2016-03-05 ENCOUNTER — Ambulatory Visit (HOSPITAL_BASED_OUTPATIENT_CLINIC_OR_DEPARTMENT_OTHER): Payer: Medicare Other

## 2016-03-05 ENCOUNTER — Other Ambulatory Visit (HOSPITAL_BASED_OUTPATIENT_CLINIC_OR_DEPARTMENT_OTHER): Payer: Medicare Other

## 2016-03-05 ENCOUNTER — Other Ambulatory Visit: Payer: Self-pay | Admitting: *Deleted

## 2016-03-05 VITALS — BP 110/56 | HR 72 | Temp 97.7°F | Resp 18

## 2016-03-05 DIAGNOSIS — D4620 Refractory anemia with excess of blasts, unspecified: Secondary | ICD-10-CM | POA: Diagnosis present

## 2016-03-05 DIAGNOSIS — D61818 Other pancytopenia: Secondary | ICD-10-CM

## 2016-03-05 DIAGNOSIS — D469 Myelodysplastic syndrome, unspecified: Secondary | ICD-10-CM

## 2016-03-05 LAB — CBC & DIFF AND RETIC
BASO%: 0.8 % (ref 0.0–2.0)
Basophils Absolute: 0 10*3/uL (ref 0.0–0.1)
EOS%: 0 % (ref 0.0–7.0)
Eosinophils Absolute: 0 10*3/uL (ref 0.0–0.5)
HCT: 23.5 % — ABNORMAL LOW (ref 38.4–49.9)
HGB: 7.8 g/dL — ABNORMAL LOW (ref 13.0–17.1)
Immature Retic Fract: 17.3 % — ABNORMAL HIGH (ref 3.00–10.60)
LYMPH%: 20.4 % (ref 14.0–49.0)
MCH: 27.9 pg (ref 27.2–33.4)
MCHC: 33.2 g/dL (ref 32.0–36.0)
MCV: 83.9 fL (ref 79.3–98.0)
MONO#: 1 10*3/uL — ABNORMAL HIGH (ref 0.1–0.9)
MONO%: 18.1 % — ABNORMAL HIGH (ref 0.0–14.0)
NEUT#: 3.2 10*3/uL (ref 1.5–6.5)
NEUT%: 60.7 % (ref 39.0–75.0)
PLATELETS: 22 10*3/uL — AB (ref 140–400)
RBC: 2.8 10*6/uL — AB (ref 4.20–5.82)
RDW: 16 % — AB (ref 11.0–14.6)
RETIC CT ABS: 36.4 10*3/uL (ref 34.80–93.90)
Retic %: 1.3 % (ref 0.80–1.80)
WBC: 5.3 10*3/uL (ref 4.0–10.3)
lymph#: 1.1 10*3/uL (ref 0.9–3.3)

## 2016-03-05 LAB — PREPARE RBC (CROSSMATCH)

## 2016-03-05 LAB — TECHNOLOGIST REVIEW

## 2016-03-05 MED ORDER — ACETAMINOPHEN 325 MG PO TABS
ORAL_TABLET | ORAL | Status: AC
  Filled 2016-03-05: qty 2

## 2016-03-05 MED ORDER — TRAZODONE HCL 100 MG PO TABS
100.0000 mg | ORAL_TABLET | Freq: Every evening | ORAL | Status: DC | PRN
Start: 2016-03-05 — End: 2016-08-02

## 2016-03-05 MED ORDER — SODIUM CHLORIDE 0.9% FLUSH
3.0000 mL | INTRAVENOUS | Status: DC | PRN
  Filled 2016-03-05: qty 10

## 2016-03-05 MED ORDER — DIPHENHYDRAMINE HCL 25 MG PO CAPS
ORAL_CAPSULE | ORAL | Status: AC
  Filled 2016-03-05: qty 1

## 2016-03-05 MED ORDER — SODIUM CHLORIDE 0.9% FLUSH
10.0000 mL | INTRAVENOUS | Status: DC | PRN
  Filled 2016-03-05: qty 10

## 2016-03-05 MED ORDER — SODIUM CHLORIDE 0.9 % IV SOLN
250.0000 mL | Freq: Once | INTRAVENOUS | Status: AC
  Administered 2016-03-05: 250 mL via INTRAVENOUS

## 2016-03-05 MED ORDER — DIPHENHYDRAMINE HCL 25 MG PO CAPS
25.0000 mg | ORAL_CAPSULE | Freq: Once | ORAL | Status: AC
  Administered 2016-03-05: 25 mg via ORAL

## 2016-03-05 MED ORDER — ACETAMINOPHEN 325 MG PO TABS
650.0000 mg | ORAL_TABLET | Freq: Once | ORAL | Status: AC
  Administered 2016-03-05: 650 mg via ORAL

## 2016-03-05 NOTE — Patient Instructions (Signed)

## 2016-03-08 ENCOUNTER — Ambulatory Visit (HOSPITAL_COMMUNITY)
Admission: RE | Admit: 2016-03-08 | Discharge: 2016-03-08 | Disposition: A | Discharge status: Home / Self Care | Payer: Medicare Other | Source: Ambulatory Visit | Attending: Hematology and Oncology | Admitting: Hematology and Oncology

## 2016-03-08 DIAGNOSIS — D469 Myelodysplastic syndrome, unspecified: Secondary | ICD-10-CM

## 2016-03-08 LAB — TYPE AND SCREEN
ABO/RH(D): O POS
ANTIBODY SCREEN: NEGATIVE
UNIT DIVISION: 0
Unit division: 0

## 2016-03-12 ENCOUNTER — Telehealth: Payer: Self-pay | Admitting: *Deleted

## 2016-03-12 ENCOUNTER — Other Ambulatory Visit (HOSPITAL_BASED_OUTPATIENT_CLINIC_OR_DEPARTMENT_OTHER): Payer: Medicare Other

## 2016-03-12 ENCOUNTER — Other Ambulatory Visit: Payer: Self-pay | Admitting: Hematology and Oncology

## 2016-03-12 DIAGNOSIS — D4620 Refractory anemia with excess of blasts, unspecified: Secondary | ICD-10-CM

## 2016-03-12 DIAGNOSIS — D61818 Other pancytopenia: Secondary | ICD-10-CM

## 2016-03-12 LAB — CBC & DIFF AND RETIC
BASO%: 0.7 % (ref 0.0–2.0)
BASOS ABS: 0 10*3/uL (ref 0.0–0.1)
EOS ABS: 0 10*3/uL (ref 0.0–0.5)
EOS%: 0 % (ref 0.0–7.0)
HEMATOCRIT: 24.4 % — AB (ref 38.4–49.9)
HEMOGLOBIN: 8.1 g/dL — AB (ref 13.0–17.1)
Immature Retic Fract: 12.8 % — ABNORMAL HIGH (ref 3.00–10.60)
LYMPH%: 35.2 % (ref 14.0–49.0)
MCH: 28.1 pg (ref 27.2–33.4)
MCHC: 33.2 g/dL (ref 32.0–36.0)
MCV: 84.7 fL (ref 79.3–98.0)
MONO#: 0.3 10*3/uL (ref 0.1–0.9)
MONO%: 6.1 % (ref 0.0–14.0)
NEUT%: 58 % (ref 39.0–75.0)
NEUTROS ABS: 2.7 10*3/uL (ref 1.5–6.5)
Platelets: 19 10*3/uL — ABNORMAL LOW (ref 140–400)
RBC: 2.88 10*6/uL — ABNORMAL LOW (ref 4.20–5.82)
RDW: 15.7 % — ABNORMAL HIGH (ref 11.0–14.6)
Retic %: 1.04 % (ref 0.80–1.80)
Retic Ct Abs: 29.95 10*3/uL — ABNORMAL LOW (ref 34.80–93.90)
WBC: 4.6 10*3/uL (ref 4.0–10.3)
lymph#: 1.6 10*3/uL (ref 0.9–3.3)

## 2016-03-12 LAB — TECHNOLOGIST REVIEW

## 2016-03-12 NOTE — Telephone Encounter (Signed)
S/w pt in lobby.  Reviewed CBC results.   Hgb 8.1.  Pt states feels ok today does not feel he needs blood transfusion.  He will keep appt next week as scheduled.

## 2016-03-18 ENCOUNTER — Other Ambulatory Visit: Payer: Self-pay | Admitting: *Deleted

## 2016-03-18 DIAGNOSIS — D4620 Refractory anemia with excess of blasts, unspecified: Secondary | ICD-10-CM

## 2016-03-19 ENCOUNTER — Telehealth: Payer: Self-pay | Admitting: Hematology and Oncology

## 2016-03-19 ENCOUNTER — Other Ambulatory Visit (HOSPITAL_BASED_OUTPATIENT_CLINIC_OR_DEPARTMENT_OTHER): Payer: Medicare Other

## 2016-03-19 ENCOUNTER — Encounter: Payer: Self-pay | Admitting: Hematology and Oncology

## 2016-03-19 ENCOUNTER — Ambulatory Visit (HOSPITAL_BASED_OUTPATIENT_CLINIC_OR_DEPARTMENT_OTHER): Payer: Medicare Other | Admitting: Hematology and Oncology

## 2016-03-19 ENCOUNTER — Ambulatory Visit (HOSPITAL_BASED_OUTPATIENT_CLINIC_OR_DEPARTMENT_OTHER): Payer: Medicare Other

## 2016-03-19 ENCOUNTER — Other Ambulatory Visit: Payer: Self-pay | Admitting: Hematology and Oncology

## 2016-03-19 VITALS — BP 103/49 | HR 92 | Temp 98.1°F | Resp 18 | Ht 67.0 in | Wt 152.6 lb

## 2016-03-19 VITALS — BP 106/54 | HR 76 | Temp 98.6°F | Resp 18

## 2016-03-19 DIAGNOSIS — D4620 Refractory anemia with excess of blasts, unspecified: Secondary | ICD-10-CM

## 2016-03-19 DIAGNOSIS — R161 Splenomegaly, not elsewhere classified: Secondary | ICD-10-CM | POA: Diagnosis not present

## 2016-03-19 DIAGNOSIS — D469 Myelodysplastic syndrome, unspecified: Secondary | ICD-10-CM | POA: Diagnosis present

## 2016-03-19 DIAGNOSIS — D61818 Other pancytopenia: Secondary | ICD-10-CM

## 2016-03-19 LAB — CBC WITH DIFFERENTIAL/PLATELET
BASO%: 0.9 % (ref 0.0–2.0)
BASOS ABS: 0 10*3/uL (ref 0.0–0.1)
EOS ABS: 0 10*3/uL (ref 0.0–0.5)
EOS%: 0 % (ref 0.0–7.0)
HEMATOCRIT: 20 % — AB (ref 38.4–49.9)
HEMOGLOBIN: 6.5 g/dL — AB (ref 13.0–17.1)
LYMPH%: 16.8 % (ref 14.0–49.0)
MCH: 27.6 pg (ref 27.2–33.4)
MCHC: 32.7 g/dL (ref 32.0–36.0)
MCV: 84.4 fL (ref 79.3–98.0)
MONO#: 0.6 10*3/uL (ref 0.1–0.9)
MONO%: 14.8 % — AB (ref 0.0–14.0)
NEUT%: 67.5 % (ref 39.0–75.0)
NEUTROS ABS: 2.8 10*3/uL (ref 1.5–6.5)
PLATELETS: 22 10*3/uL — AB (ref 140–400)
RBC: 2.36 10*6/uL — ABNORMAL LOW (ref 4.20–5.82)
RDW: 15.1 % — AB (ref 11.0–14.6)
WBC: 4.2 10*3/uL (ref 4.0–10.3)
lymph#: 0.7 10*3/uL — ABNORMAL LOW (ref 0.9–3.3)

## 2016-03-19 LAB — PREPARE RBC (CROSSMATCH)

## 2016-03-19 MED ORDER — ACETAMINOPHEN 325 MG PO TABS
650.0000 mg | ORAL_TABLET | Freq: Once | ORAL | Status: AC
  Administered 2016-03-19: 650 mg via ORAL

## 2016-03-19 MED ORDER — ACETAMINOPHEN 325 MG PO TABS
ORAL_TABLET | ORAL | Status: AC
Start: 2016-03-19 — End: 2016-03-19
  Filled 2016-03-19: qty 2

## 2016-03-19 MED ORDER — SODIUM CHLORIDE 0.9 % IV SOLN
250.0000 mL | Freq: Once | INTRAVENOUS | Status: AC
  Administered 2016-03-19: 250 mL via INTRAVENOUS

## 2016-03-19 MED ORDER — DIPHENHYDRAMINE HCL 25 MG PO CAPS
25.0000 mg | ORAL_CAPSULE | Freq: Once | ORAL | Status: AC
  Administered 2016-03-19: 25 mg via ORAL

## 2016-03-19 MED ORDER — DIPHENHYDRAMINE HCL 25 MG PO CAPS
ORAL_CAPSULE | ORAL | Status: AC
  Filled 2016-03-19: qty 1

## 2016-03-19 NOTE — Telephone Encounter (Signed)
Gave pt for April, may, June, & avs

## 2016-03-19 NOTE — Assessment & Plan Note (Signed)
The patient is not a candidate for clinical trial. We discussed treatment options which are limited. The patient wants to preserve quality of life. At the time of discussion with the patient, he is in agreement to just receive palliative supportive care/transfusion support. In the meantime, we will continue blood transfusion support every 10 days to keep hemoglobin above 8 g.

## 2016-03-19 NOTE — Assessment & Plan Note (Signed)
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 He is not symptomatic with pain. Recommend observation only at this point.

## 2016-03-19 NOTE — Patient Instructions (Signed)

## 2016-03-19 NOTE — Assessment & Plan Note (Signed)
He is not been feeling well. I plan to modify his schedule to come here every 10 days instead of 14 days due to progressive pancytopenia We discussed some of the risks, benefits, and alternatives of blood transfusions. The patient is symptomatic from anemia and the hemoglobin level is critically low.  Some of the side-effects to be expected including risks of transfusion reactions, chills, infection, syndrome of volume overload and risk of hospitalization from various reasons and the patient is willing to proceed and went ahead to sign consent today. He will get 2 units of blood whenever hemoglobin is less than 8 g. In terms of thrombocytopenia, he will get 1 unit of platelet whenever his blood count is less than 10,000 or have active bleeding The patient needs irradiated blood products.

## 2016-03-19 NOTE — Progress Notes (Signed)
[place] OFFICE PROGRESS NOTE  Patient Care Team: Jonathon Jordan, MD as PCP - General (Family Medicine) Ronald Lobo, MD (Gastroenterology) Jonathon Jordan, MD as Attending Physician (Family Medicine) Heath Lark, MD as Consulting Physician (Hematology and Oncology) Roxana Hires, MD as Consulting Physician (Hematology and Oncology)  SUMMARY OF ONCOLOGIC HISTORY: Oncology History   R-IPSS score 3.5     MDS (myelodysplastic syndrome), low grade ([place])   04/26/2013 Bone Marrow Biopsy BM biopsy confirmed MDS with multilineage dysplasia, 4% blast, normal cytogenetics.   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 He complained of profound fatigue. He has mild chest discomfort. He has shortness of breath on minimal exertion. He bruises easily with intermittent nosebleed if he sneezes or nasal drainage  REVIEW OF SYSTEMS:   Constitutional: Denies fevers, chills or abnormal weight loss Eyes: Denies blurriness of vision Ears, nose, mouth, throat, and face: Denies mucositis or sore throat Respiratory: Denies cough, dyspnea or wheezes Cardiovascular: Denies palpitation, chest discomfort or lower extremity swelling Gastrointestinal:  Denies nausea, heartburn or change in bowel habits Skin: Denies abnormal skin rashes Lymphatics: Denies new lymphadenopathy  Neurological:Denies numbness, tingling or new weaknesses Behavioral/Psych: Mood is stable, no new changes  All other systems were reviewed with the patient and are negative.  I have reviewed the past medical history, past surgical history, social history and family history with the patient and they are unchanged from previous note.  ALLERGIES:  has No Known  Allergies.  MEDICATIONS:  Current Outpatient Prescriptions  Medication Sig Dispense Refill  . acetaminophen (TYLENOL) 500 MG tablet Take 1,000 mg by mouth every 6 (six) hours as needed for pain.    Marland Kitchen allopurinol (ZYLOPRIM) 100 MG tablet Take 50 mg by mouth daily.     Marland Kitchen atorvastatin (LIPITOR) 20 MG tablet Take 1 tablet (20 mg total) by mouth daily. 90 tablet 3  . Calcium-Vitamin D (CALTRATE 600 PLUS-VIT D PO) Take 1 tablet by mouth 2 (two) times daily.     Marland Kitchen lisinopril-hydrochlorothiazide (PRINZIDE,ZESTORETIC) 10-12.5 MG per tablet Take 0.5 tablets by mouth daily. 45 tablet 2  . loratadine (CLARITIN) 10 MG tablet Take 10 mg by mouth daily.    . metoprolol (LOPRESSOR) 50 MG tablet take 1/2 tablet by mouth twice a day 30 tablet 11  . MULTIPLE VITAMINS-MINERALS PO Take 1 tablet by mouth 2 (two) times daily.    . Omega-3 Fatty Acids (FISH OIL) 1000 MG CAPS Take 2,000 mg by mouth 2 (two) times daily.    . predniSONE (DELTASONE) 5 MG tablet Take 7.5 mg by mouth daily.     . traMADol (ULTRAM) 50 MG tablet Take 25 mg by mouth every 4 (four) hours as needed for pain.     . traZODone (DESYREL) 100 MG tablet Take 1 tablet (100 mg total) by mouth at bedtime as needed for sleep. 30 tablet 9  . ALPRAZolam (XANAX) 0.25 MG tablet Take 0.125 mg by mouth 2 (two) times daily as needed for anxiety. Reported on 03/19/2016  0  . NITROSTAT 0.4 MG SL tablet Place 0.4 mg under the tongue every 5 (five) minutes as needed. Reported on 03/19/2016  0   No current facility-administered medications for this visit.   Facility-Administered Medications Ordered in Other Visits  Medication Dose Route Frequency Provider Last Rate Last Dose  . 0.9 %  sodium chloride infusion  250 mL Intravenous Once Heath Lark, MD        PHYSICAL EXAMINATION: ECOG PERFORMANCE STATUS: 1 - Symptomatic but completely ambulatory  Filed Vitals:   03/19/16 0833  BP: 103/49  Pulse: 92  Temp: 98.1 F (36.7 C)  Resp: 18   Filed Weights    03/19/16 0833  Weight: 152 lb 9.6 oz (69.219 kg)    GENERAL:alert, no distress and comfortable SKIN: Extensive skin bruises are noted. EYES: normal, Conjunctiva are pink and non-injected, sclera clear OROPHARYNX:no exudate, no erythema and lips, buccal mucosa, and tongue normal  NECK: supple, thyroid normal size, non-tender, without nodularity LYMPH:  no palpable lymphadenopathy in the cervical, axillary or inguinal LUNGS: clear to auscultation and percussion with normal breathing effort HEART: regular rate & rhythm and no murmurs and no lower extremity edema ABDOMEN:abdomen soft, non-tender and normal bowel sounds. He has palpable splenomegaly Musculoskeletal:no cyanosis of digits and no clubbing  NEURO: alert & oriented x 3 with fluent speech, no focal motor/sensory deficits  LABORATORY DATA:  I have reviewed the data as listed    Component Value Date/Time   NA 136 11/16/2015 2250   NA 138 05/30/2014 1138   K 3.7 11/16/2015 2250   K 3.7 05/30/2014 1138   CL 106 11/16/2015 2250   CL 102 04/09/2013 1347   CO2 20* 11/16/2015 2250   CO2 24 05/30/2014 1138   GLUCOSE 120* 11/16/2015 2250   GLUCOSE 147* 05/30/2014 1138   GLUCOSE 113* 04/09/2013 1347   BUN 16 11/16/2015 2250   BUN 26.2* 05/30/2014 1138   CREATININE 0.78 11/16/2015 2250   CREATININE 0.8 05/30/2014 1138   CALCIUM 8.6* 11/16/2015 2250   CALCIUM 9.8 05/30/2014 1138   PROT 6.9 11/16/2015 2250   PROT 7.4 11/22/2013 0847   ALBUMIN 2.7* 11/16/2015 2250   ALBUMIN 4.1 11/22/2013 0847   AST 26 11/16/2015 2250   AST 15 11/22/2013 0847   ALT 19 11/16/2015 2250   ALT 15 11/22/2013 0847   ALKPHOS 93 11/16/2015 2250   ALKPHOS 48 11/22/2013 0847   BILITOT 1.1 11/16/2015 2250   BILITOT 1.15 11/22/2013 0847   GFRNONAA >60 11/16/2015 2250   GFRAA >60 11/16/2015 2250    No results found for: SPEP, UPEP  Lab Results  Component Value Date   WBC 4.2 03/19/2016   NEUTROABS 2.8 03/19/2016   HGB 6.5* 03/19/2016   HCT 20.0*  03/19/2016   MCV 84.4 03/19/2016   PLT 22* 03/19/2016      Chemistry      Component Value Date/Time   NA 136 11/16/2015 2250   NA 138 05/30/2014 1138   K 3.7 11/16/2015 2250   K 3.7 05/30/2014 1138   CL 106 11/16/2015 2250   CL 102 04/09/2013 1347   CO2 20* 11/16/2015 2250   CO2 24 05/30/2014 1138   BUN 16 11/16/2015 2250   BUN 26.2* 05/30/2014 1138   CREATININE 0.78 11/16/2015 2250   CREATININE 0.8 05/30/2014 1138      Component Value Date/Time   CALCIUM 8.6* 11/16/2015 2250   CALCIUM 9.8 05/30/2014 1138   ALKPHOS 93 11/16/2015 2250   ALKPHOS 48 11/22/2013 0847   AST 26 11/16/2015 2250   AST 15 11/22/2013 0847   ALT 19 11/16/2015 2250   ALT 15 11/22/2013 0847   BILITOT 1.1 11/16/2015 2250   BILITOT 1.15 11/22/2013 0847  ASSESSMENT & PLAN:  MDS (myelodysplastic syndrome), low grade The patient is not a candidate for clinical trial. We discussed treatment options which are limited. The patient wants to preserve quality of life. At the time of discussion with the patient, he is in agreement to just receive palliative supportive care/transfusion support. In the meantime, we will continue blood transfusion support every 10 days to keep hemoglobin above 8 g.  Pancytopenia, acquired [facility]) He is not been feeling well. I plan to modify his schedule to come here every 10 days instead of 14 days due to progressive pancytopenia We discussed some of the risks, benefits, and alternatives of blood transfusions. The patient is symptomatic from anemia and the hemoglobin level is critically low.  Some of the side-effects to be expected including risks of transfusion reactions, chills, infection, syndrome of volume overload and risk of hospitalization from various reasons and the patient is willing to proceed and went ahead to sign consent today. He will get 2 units of blood whenever hemoglobin is less than 8 g. In terms of thrombocytopenia, he will get 1 unit of platelet whenever  his blood count is less than 10,000 or have active bleeding The patient needs irradiated blood products.  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 He is not symptomatic with pain. Recommend observation only at this point.   No orders of the defined types were placed in this encounter.   All questions were answered. The patient knows to call the clinic with any problems, questions or concerns. No barriers to learning was detected. I spent 20 minutes counseling the patient face to face. The total time spent in the appointment was 25 minutes and more than 50% was on counseling and review of test results     [facility], [place], MD 03/19/2016 12:00 PM

## 2016-03-22 LAB — TYPE AND SCREEN
ABO/RH(D): O POS
ANTIBODY SCREEN: NEGATIVE
UNIT DIVISION: 0
UNIT DIVISION: 0
UNIT DIVISION: 0

## 2016-03-24 ENCOUNTER — Telehealth: Payer: Self-pay | Admitting: *Deleted

## 2016-03-24 NOTE — Telephone Encounter (Signed)
Most likely Allergy related Recent WBC did not show neutropenia Is he using regular nasal sprays like nasacort or Afrin? Main thing he needs is to stop draining OTC Mucinex for congestion

## 2016-03-24 NOTE — Telephone Encounter (Signed)
Pt states he is only using saline nasal spray- Not Afrin or Nasacort. Do you want him to start one of these?  Will get mucinex OTC

## 2016-03-24 NOTE — Telephone Encounter (Signed)
Pt states his cough has increased over the last 3 days. "Drainage has gotten worse, still clear but thick- is hard to get it out". Does not "feel bad" but getting hoarse. Temp 98-99.

## 2016-03-24 NOTE — Telephone Encounter (Signed)
Saline will not help He needs either nasacort or Afrin along with Mucinex Consider regular doses of Allegra or Claritin D

## 2016-03-24 NOTE — Telephone Encounter (Signed)
Wife notified of message below 

## 2016-03-29 ENCOUNTER — Ambulatory Visit (HOSPITAL_BASED_OUTPATIENT_CLINIC_OR_DEPARTMENT_OTHER): Payer: Medicare Other

## 2016-03-29 ENCOUNTER — Other Ambulatory Visit: Payer: Self-pay | Admitting: Hematology and Oncology

## 2016-03-29 ENCOUNTER — Other Ambulatory Visit (HOSPITAL_BASED_OUTPATIENT_CLINIC_OR_DEPARTMENT_OTHER): Payer: Medicare Other

## 2016-03-29 VITALS — BP 113/47 | HR 77 | Temp 98.2°F | Resp 18

## 2016-03-29 DIAGNOSIS — D469 Myelodysplastic syndrome, unspecified: Secondary | ICD-10-CM

## 2016-03-29 DIAGNOSIS — D61818 Other pancytopenia: Secondary | ICD-10-CM

## 2016-03-29 DIAGNOSIS — D4620 Refractory anemia with excess of blasts, unspecified: Secondary | ICD-10-CM

## 2016-03-29 LAB — CBC WITH DIFFERENTIAL/PLATELET
BASO%: 1.2 % (ref 0.0–2.0)
Basophils Absolute: 0 10*3/uL (ref 0.0–0.1)
EOS%: 0 % (ref 0.0–7.0)
Eosinophils Absolute: 0 10*3/uL (ref 0.0–0.5)
HCT: 20.9 % — ABNORMAL LOW (ref 38.4–49.9)
HGB: 6.9 g/dL — CL (ref 13.0–17.1)
LYMPH%: 27.3 % (ref 14.0–49.0)
MCH: 27.7 pg (ref 27.2–33.4)
MCHC: 33 g/dL (ref 32.0–36.0)
MCV: 83.9 fL (ref 79.3–98.0)
MONO#: 0.3 10*3/uL (ref 0.1–0.9)
MONO%: 10.2 % (ref 0.0–14.0)
NEUT%: 61.3 % (ref 39.0–75.0)
NEUTROS ABS: 2 10*3/uL (ref 1.5–6.5)
NRBC: 2 % — AB (ref 0–0)
Platelets: 13 10*3/uL — ABNORMAL LOW (ref 140–400)
RBC: 2.49 10*6/uL — AB (ref 4.20–5.82)
RDW: 15.7 % — AB (ref 11.0–14.6)
WBC: 3.2 10*3/uL — AB (ref 4.0–10.3)
lymph#: 0.9 10*3/uL (ref 0.9–3.3)

## 2016-03-29 LAB — TECHNOLOGIST REVIEW

## 2016-03-29 LAB — PREPARE RBC (CROSSMATCH)

## 2016-03-29 MED ORDER — SODIUM CHLORIDE 0.9 % IV SOLN
250.0000 mL | Freq: Once | INTRAVENOUS | Status: AC
  Administered 2016-03-29: 250 mL via INTRAVENOUS

## 2016-03-29 MED ORDER — ACETAMINOPHEN 325 MG PO TABS
650.0000 mg | ORAL_TABLET | Freq: Once | ORAL | Status: AC
  Administered 2016-03-29: 650 mg via ORAL

## 2016-03-29 MED ORDER — ACETAMINOPHEN 325 MG PO TABS
ORAL_TABLET | ORAL | Status: AC
  Filled 2016-03-29: qty 2

## 2016-03-29 MED ORDER — DIPHENHYDRAMINE HCL 25 MG PO CAPS
25.0000 mg | ORAL_CAPSULE | Freq: Once | ORAL | Status: AC
  Administered 2016-03-29: 25 mg via ORAL

## 2016-03-29 MED ORDER — DIPHENHYDRAMINE HCL 25 MG PO CAPS
ORAL_CAPSULE | ORAL | Status: AC
  Filled 2016-03-29: qty 1

## 2016-03-29 NOTE — Patient Instructions (Signed)

## 2016-03-30 LAB — TYPE AND SCREEN
ABO/RH(D): O POS
Antibody Screen: NEGATIVE
UNIT DIVISION: 0
UNIT DIVISION: 0

## 2016-04-05 ENCOUNTER — Other Ambulatory Visit: Payer: Self-pay | Admitting: Hematology and Oncology

## 2016-04-05 ENCOUNTER — Encounter: Payer: Self-pay | Admitting: Hematology and Oncology

## 2016-04-05 ENCOUNTER — Telehealth: Payer: Self-pay | Admitting: *Deleted

## 2016-04-05 DIAGNOSIS — M62830 Muscle spasm of back: Secondary | ICD-10-CM

## 2016-04-05 DIAGNOSIS — D4620 Refractory anemia with excess of blasts, unspecified: Secondary | ICD-10-CM

## 2016-04-05 HISTORY — DX: Muscle spasm of back: M62.830

## 2016-04-05 MED ORDER — CYCLOBENZAPRINE HCL 10 MG PO TABS: 10.0000 mg | ORAL_TABLET | Freq: Three times a day (TID) | ORAL | Status: DC | PRN

## 2016-04-05 NOTE — Telephone Encounter (Signed)
Pt states he is having back pain, thinks it is muscle spasms. Has tried tramadol without relief.   Is wondering if Dr Alvy Bimler will give him a muscle relaxant. Has lab/treatment appt at [facility] on Thursday.

## 2016-04-05 NOTE — Telephone Encounter (Signed)
Pt notified that Dr Alvy Bimler prescribed Flexeril, be cautious as it might make him drowsy- might start with 1/2 tab. Try heating pad or massage. Verbalized understanding

## 2016-04-05 NOTE — Telephone Encounter (Signed)
I placed orders electronically Warn him risk of sedation; he may want to try 1/2 tablet first He can take up to TID. Heat pad and massages may help also

## 2016-04-08 ENCOUNTER — Other Ambulatory Visit (HOSPITAL_BASED_OUTPATIENT_CLINIC_OR_DEPARTMENT_OTHER): Payer: Medicare Other

## 2016-04-08 ENCOUNTER — Encounter: Payer: Self-pay | Admitting: Hematology and Oncology

## 2016-04-08 ENCOUNTER — Other Ambulatory Visit: Payer: Self-pay | Admitting: Hematology and Oncology

## 2016-04-08 ENCOUNTER — Ambulatory Visit (HOSPITAL_BASED_OUTPATIENT_CLINIC_OR_DEPARTMENT_OTHER): Payer: Medicare Other

## 2016-04-08 ENCOUNTER — Telehealth: Payer: Self-pay | Admitting: Hematology and Oncology

## 2016-04-08 ENCOUNTER — Ambulatory Visit (HOSPITAL_COMMUNITY)
Admission: RE | Admit: 2016-04-08 | Discharge: 2016-04-08 | Disposition: A | Discharge status: Home / Self Care | Payer: Medicare Other | Source: Ambulatory Visit | Attending: Hematology and Oncology | Admitting: Hematology and Oncology

## 2016-04-08 ENCOUNTER — Ambulatory Visit (HOSPITAL_BASED_OUTPATIENT_CLINIC_OR_DEPARTMENT_OTHER): Payer: Medicare Other | Admitting: Hematology and Oncology

## 2016-04-08 VITALS — BP 98/57 | HR 66 | Temp 97.0°F | Resp 16

## 2016-04-08 DIAGNOSIS — D469 Myelodysplastic syndrome, unspecified: Secondary | ICD-10-CM

## 2016-04-08 DIAGNOSIS — M62830 Muscle spasm of back: Secondary | ICD-10-CM

## 2016-04-08 DIAGNOSIS — M545 Low back pain, unspecified: Secondary | ICD-10-CM | POA: Insufficient documentation

## 2016-04-08 DIAGNOSIS — D4620 Refractory anemia with excess of blasts, unspecified: Secondary | ICD-10-CM

## 2016-04-08 DIAGNOSIS — D61818 Other pancytopenia: Secondary | ICD-10-CM

## 2016-04-08 LAB — CBC WITH DIFFERENTIAL/PLATELET
BASO%: 0.7 % (ref 0.0–2.0)
BASOS ABS: 0 10*3/uL (ref 0.0–0.1)
EOS ABS: 0 10*3/uL (ref 0.0–0.5)
EOS%: 0 % (ref 0.0–7.0)
HCT: 24.6 % — ABNORMAL LOW (ref 38.4–49.9)
HEMOGLOBIN: 8.2 g/dL — AB (ref 13.0–17.1)
LYMPH#: 0.9 10*3/uL (ref 0.9–3.3)
LYMPH%: 19.3 % (ref 14.0–49.0)
MCH: 28.2 pg (ref 27.2–33.4)
MCHC: 33.3 g/dL (ref 32.0–36.0)
MCV: 84.5 fL (ref 79.3–98.0)
MONO#: 1 10*3/uL — AB (ref 0.1–0.9)
MONO%: 21.3 % — ABNORMAL HIGH (ref 0.0–14.0)
NEUT#: 2.6 10*3/uL (ref 1.5–6.5)
NEUT%: 58.7 % (ref 39.0–75.0)
NRBC: 1 % — AB (ref 0–0)
PLATELETS: 23 10*3/uL — AB (ref 140–400)
RBC: 2.91 10*6/uL — ABNORMAL LOW (ref 4.20–5.82)
RDW: 16.7 % — AB (ref 11.0–14.6)
WBC: 4.5 10*3/uL (ref 4.0–10.3)

## 2016-04-08 LAB — PREPARE RBC (CROSSMATCH)

## 2016-04-08 LAB — TECHNOLOGIST REVIEW

## 2016-04-08 MED ORDER — LIDOCAINE-PRILOCAINE 2.5-2.5 % EX CREA: 1.0000 "application " | TOPICAL_CREAM | CUTANEOUS | Status: DC | PRN

## 2016-04-08 MED ORDER — ACETAMINOPHEN 325 MG PO TABS
ORAL_TABLET | ORAL | Status: AC
  Filled 2016-04-08: qty 2

## 2016-04-08 MED ORDER — ACETAMINOPHEN 325 MG PO TABS
650.0000 mg | ORAL_TABLET | Freq: Once | ORAL | Status: AC
  Administered 2016-04-08: 650 mg via ORAL

## 2016-04-08 MED ORDER — DIPHENHYDRAMINE HCL 25 MG PO CAPS
ORAL_CAPSULE | ORAL | Status: AC
  Filled 2016-04-08: qty 1

## 2016-04-08 MED ORDER — SODIUM CHLORIDE 0.9% FLUSH
10.0000 mL | INTRAVENOUS | Status: DC | PRN
  Filled 2016-04-08: qty 10

## 2016-04-08 MED ORDER — DIPHENHYDRAMINE HCL 25 MG PO CAPS
25.0000 mg | ORAL_CAPSULE | Freq: Once | ORAL | Status: AC
  Administered 2016-04-08: 25 mg via ORAL

## 2016-04-08 MED ORDER — HYDROMORPHONE HCL 4 MG/ML IJ SOLN
1.0000 mg | INTRAMUSCULAR | Status: DC | PRN
  Administered 2016-04-08: 1 mg via INTRAVENOUS

## 2016-04-08 MED ORDER — HEPARIN SOD (PORK) LOCK FLUSH 100 UNIT/ML IV SOLN
500.0000 [IU] | Freq: Every day | INTRAVENOUS | Status: DC | PRN
  Filled 2016-04-08: qty 5

## 2016-04-08 MED ORDER — SODIUM CHLORIDE 0.9 % IV SOLN: 250.0000 mL | Freq: Once | INTRAVENOUS | Status: DC

## 2016-04-08 MED ORDER — HYDROMORPHONE HCL 4 MG/ML IJ SOLN
INTRAMUSCULAR | Status: AC
  Filled 2016-04-08: qty 1

## 2016-04-08 MED FILL — LIDOCAINE-PRILOCAINE CREAM: 2.5-2.5 | 30 days supply | Qty: 30 | Fill #0

## 2016-04-08 NOTE — Telephone Encounter (Signed)
Added appt per dr Alvy Bimler

## 2016-04-08 NOTE — Assessment & Plan Note (Signed)
The patient is not a candidate for clinical trial. We discussed treatment options which are limited. The patient wants to preserve quality of life. At the time of discussion with the patient, he is in agreement to just receive palliative supportive care/transfusion support. In the meantime, we will continue blood transfusion support every 10 days to keep hemoglobin above 8 g. Even though his blood count does not meet transfusion threshold, he complained of excessive fatigue and I will proceed to give him 1 unit of blood today. We discussed some of the risks, benefits, and alternatives of blood transfusions. The patient is symptomatic from anemia and the hemoglobin level is critically low.  Some of the side-effects to be expected including risks of transfusion reactions, chills, infection, syndrome of volume overload and risk of hospitalization from various reasons and the patient is willing to proceed and went ahead to sign consent today.

## 2016-04-08 NOTE — Progress Notes (Signed)
[facility] OFFICE PROGRESS NOTE  Patient Care Team: Mila Palmer, MD as PCP - General (Family Medicine) Bernette Redbird, MD (Gastroenterology) Mila Palmer, MD as Attending Physician (Family Medicine) Artis Delay, MD as Consulting Physician (Hematology and Oncology) Gorden Harms, MD as Consulting Physician (Hematology and Oncology)  SUMMARY OF ONCOLOGIC HISTORY: Oncology History   R-IPSS score 3.5     MDS (myelodysplastic syndrome), low grade (HCC)   04/26/2013 Bone Marrow Biopsy BM biopsy confirmed MDS with multilineage dysplasia, 4% blast, normal cytogenetics.   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 She is seen today as an urgent request because of acute onset lower back pain. At first, he thought it was related to muscle spasm. He was prescribed Flexeril with no relief. He is a subcutaneous nodule on his back that has been present for a long time and close to that region he has severe tenderness that affected his sleep. The pain is localized without radiation, rated at about 4-5 out of 10.  REVIEW OF SYSTEMS:  All other systems were reviewed with the patient and are negative.  I have reviewed the past medical history, past surgical history, social history and family history with the patient and they are unchanged from previous note.  ALLERGIES:  has No Known Allergies.  MEDICATIONS:  Current Outpatient Prescriptions  Medication Sig Dispense Refill  . acetaminophen (TYLENOL) 500 MG tablet Take 1,000 mg by mouth every 6 (six) hours as needed for pain.    Marland Kitchen allopurinol (ZYLOPRIM) 100 MG tablet Take 50 mg by mouth daily.     Marland Kitchen ALPRAZolam (XANAX) 0.25 MG tablet Take 0.125 mg by mouth 2 (two)  times daily as needed for anxiety. Reported on 03/19/2016  0  . atorvastatin (LIPITOR) 20 MG tablet Take 1 tablet (20 mg total) by mouth daily. 90 tablet 3  . Calcium-Vitamin D (CALTRATE 600 PLUS-VIT D PO) Take 1 tablet by mouth 2 (two) times daily.     . cyclobenzaprine (FLEXERIL) 10 MG tablet Take 1 tablet (10 mg total) by mouth 3 (three) times daily as needed for muscle spasms. 60 tablet 0  . lidocaine-prilocaine (EMLA) cream Apply 1 application topically as needed. 30 g 6  . lisinopril-hydrochlorothiazide (PRINZIDE,ZESTORETIC) 10-12.5 MG per tablet Take 0.5 tablets by mouth daily. 45 tablet 2  . loratadine (CLARITIN) 10 MG tablet Take 10 mg by mouth daily.    . metoprolol (LOPRESSOR) 50 MG tablet take 1/2 tablet by mouth twice a day 30 tablet 11  . MULTIPLE VITAMINS-MINERALS PO Take 1 tablet by mouth 2 (two) times daily.    Marland Kitchen NITROSTAT 0.4 MG SL tablet Place 0.4 mg under the tongue every 5 (five) minutes as needed. Reported on 03/19/2016  0  . Omega-3 Fatty Acids (FISH OIL) 1000 MG CAPS Take 2,000 mg by mouth 2 (two) times daily.    . predniSONE (DELTASONE) 5 MG tablet Take 7.5 mg by mouth daily.     . traMADol (ULTRAM) 50 MG tablet Take 25 mg by mouth every 4 (four) hours as needed for pain.     . traZODone (DESYREL) 100 MG tablet Take 1 tablet (100 mg total) by mouth at bedtime as needed for sleep. 30 tablet 9   No current facility-administered medications for  this visit.   Facility-Administered Medications Ordered in Other Visits  Medication Dose Route Frequency Provider Last Rate Last Dose  . 0.9 %  sodium chloride infusion  250 mL Intravenous Once Heath Lark, MD      . 0.9 %  sodium chloride infusion  250 mL Intravenous Once Heath Lark, MD      . heparin lock flush 100 unit/mL  500 Units Intracatheter Daily PRN Heath Lark, MD      . HYDROmorphone (DILAUDID) injection 1 mg  1 mg Intravenous Q2H PRN Heath Lark, MD   1 mg at 04/08/16 0946  . sodium chloride flush (NS) 0.9 % injection 10 mL   10 mL Intracatheter PRN Heath Lark, MD        PHYSICAL EXAMINATION: ECOG PERFORMANCE STATUS: 2 - Symptomatic, <50% confined to bed GENERAL:alert, no distress and comfortable SKIN: skin color is pale. There is a lipoma near his back. Musculoskeletal:no cyanosis of digits and no clubbing . There is a spot tenderness in the lumbar region on palpation NEURO: alert & oriented x 3 with fluent speech, no focal motor/sensory deficits  LABORATORY DATA:  I have reviewed the data as listed    Component Value Date/Time   NA 136 11/16/2015 2250   NA 138 05/30/2014 1138   K 3.7 11/16/2015 2250   K 3.7 05/30/2014 1138   CL 106 11/16/2015 2250   CL 102 04/09/2013 1347   CO2 20* 11/16/2015 2250   CO2 24 05/30/2014 1138   GLUCOSE 120* 11/16/2015 2250   GLUCOSE 147* 05/30/2014 1138   GLUCOSE 113* 04/09/2013 1347   BUN 16 11/16/2015 2250   BUN 26.2* 05/30/2014 1138   CREATININE 0.78 11/16/2015 2250   CREATININE 0.8 05/30/2014 1138   CALCIUM 8.6* 11/16/2015 2250   CALCIUM 9.8 05/30/2014 1138   PROT 6.9 11/16/2015 2250   PROT 7.4 11/22/2013 0847   ALBUMIN 2.7* 11/16/2015 2250   ALBUMIN 4.1 11/22/2013 0847   AST 26 11/16/2015 2250   AST 15 11/22/2013 0847   ALT 19 11/16/2015 2250   ALT 15 11/22/2013 0847   ALKPHOS 93 11/16/2015 2250   ALKPHOS 48 11/22/2013 0847   BILITOT 1.1 11/16/2015 2250   BILITOT 1.15 11/22/2013 0847   GFRNONAA >60 11/16/2015 2250   GFRAA >60 11/16/2015 2250    No results found for: SPEP, UPEP  Lab Results  Component Value Date   WBC 4.5 04/08/2016   NEUTROABS 2.6 04/08/2016   HGB 8.2* 04/08/2016   HCT 24.6* 04/08/2016   MCV 84.5 04/08/2016   PLT 23* 04/08/2016      Chemistry      Component Value Date/Time   NA 136 11/16/2015 2250   NA 138 05/30/2014 1138   K 3.7 11/16/2015 2250   K 3.7 05/30/2014 1138   CL 106 11/16/2015 2250   CL 102 04/09/2013 1347   CO2 20* 11/16/2015 2250   CO2 24 05/30/2014 1138   BUN 16 11/16/2015 2250   BUN 26.2*  05/30/2014 1138   CREATININE 0.78 11/16/2015 2250   CREATININE 0.8 05/30/2014 1138      Component Value Date/Time   CALCIUM 8.6* 11/16/2015 2250   CALCIUM 9.8 05/30/2014 1138   ALKPHOS 93 11/16/2015 2250   ALKPHOS 48 11/22/2013 0847   AST 26 11/16/2015 2250   AST 15 11/22/2013 0847   ALT 19 11/16/2015 2250   ALT 15 11/22/2013 0847   BILITOT 1.1 11/16/2015 2250   BILITOT 1.15 11/22/2013 0847  ASSESSMENT & PLAN:  MDS (myelodysplastic syndrome), low grade The patient is not a candidate for clinical trial. We discussed treatment options which are limited. The patient wants to preserve quality of life. At the time of discussion with the patient, he is in agreement to just receive palliative supportive care/transfusion support. In the meantime, we will continue blood transfusion support every 10 days to keep hemoglobin above 8 g. Even though his blood count does not meet transfusion threshold, he complained of excessive fatigue and I will proceed to give him 1 unit of blood today. We discussed some of the risks, benefits, and alternatives of blood transfusions. The patient is symptomatic from anemia and the hemoglobin level is critically low.  Some of the side-effects to be expected including risks of transfusion reactions, chills, infection, syndrome of volume overload and risk of hospitalization from various reasons and the patient is willing to proceed and went ahead to sign consent today.  Pain in lower back He has a specific spot that caused localized tenderness. I recommend topical lidocaine cream and to continue on his prednisone therapy. I also recommend using heat pad if tolerated.   No orders of the defined types were placed in this encounter.   All questions were answered. The patient knows to call the clinic with any problems, questions or concerns. No barriers to learning was detected. I spent 15 minutes counseling the patient face to face. The total time spent in the  appointment was 20 minutes and more than 50% was on counseling and review of test results     [facility], Kawan Valladolid, MD 04/08/2016 1:26 PM

## 2016-04-08 NOTE — Patient Instructions (Signed)
Blood Transfusion   A blood transfusion is a procedure that gives you donated blood through an IV tube. You may need blood because of illness, surgery, or injury. The blood may come from a donor. The blood may also be your own blood that you donated earlier.  The blood you get is made up of different types of cells. You may get:    Red blood cells. These carry oxygen and replace lost blood.    Platelets. These control bleeding.    Plasma. This helps blood to clot.  If you have a clotting disorder, you may also get other types of blood products.   BEFORE THE PROCEDURE   You may have a blood test. This finds out what type of blood you have. It also finds out what kind of blood your body will accept.    If you are going to have a planned surgery, you may donate your own blood. This is done in case you need to have a transfusion.    If you have had an allergic transfusion reaction before, you may be given medicine to help prevent a reaction. Take this medicine only as told by your doctor.   You will have your temperature, blood pressure, and pulse checked.  PROCEDURE    An IV will be started in your hand or arm.    The bag of donated blood will be attached to your IV and run into your vein.    A doctor will regularly check your temperature, blood pressure, and pulse during the procedure. This is done to find any early signs of a transfusion reaction.   If you have any signs or symptoms of a reaction, the procedure may be stopped and you may be given medicine.    When the transfusion is over, your IV will be removed.    Pressure may be applied to the IV site for a few minutes.    A bandage (dressing) will be applied.   The procedure may vary among doctors and hospitals.   AFTER THE PROCEDURE   Your blood pressure, temperature, and pulse will be checked regularly.     This information is not intended to replace advice given to you by your health care provider. Make sure you discuss any questions  you have with your health care provider.     Document Released: 02/18/2009 Document Revised: 12/13/2014 Document Reviewed: 10/02/2014  Elsevier Interactive Patient Education 2016 [employer]

## 2016-04-08 NOTE — Assessment & Plan Note (Signed)
He has a specific spot that caused localized tenderness. I recommend topical lidocaine cream and to continue on his prednisone therapy. I also recommend using heat pad if tolerated.

## 2016-04-09 ENCOUNTER — Other Ambulatory Visit: Payer: Self-pay | Admitting: Hematology and Oncology

## 2016-04-09 ENCOUNTER — Telehealth: Payer: Self-pay | Admitting: *Deleted

## 2016-04-09 ENCOUNTER — Other Ambulatory Visit: Payer: Self-pay | Admitting: *Deleted

## 2016-04-09 DIAGNOSIS — M0579 Rheumatoid arthritis with rheumatoid factor of multiple sites without organ or systems involvement: Secondary | ICD-10-CM

## 2016-04-09 DIAGNOSIS — D4620 Refractory anemia with excess of blasts, unspecified: Secondary | ICD-10-CM

## 2016-04-09 LAB — TYPE AND SCREEN
ABO/RH(D): O POS
Antibody Screen: NEGATIVE
UNIT DIVISION: 0

## 2016-04-09 MED ORDER — OXYCODONE-ACETAMINOPHEN 7.5-325 MG PO TABS: 1.0000 | ORAL_TABLET | ORAL | Status: DC | PRN

## 2016-04-09 MED ORDER — PREDNISONE 10 MG PO TABS: ORAL_TABLET | ORAL | Status: DC

## 2016-04-09 MED ORDER — OXYCODONE HCL 7.5 MG PO TABS: ORAL_TABLET | ORAL | Status: DC

## 2016-04-09 MED FILL — predniSONE 10 MG TABS: 10 | 45 days supply | Qty: 90 | Fill #0

## 2016-04-09 MED FILL — OXYCODONE/APAP 7.5/325MG: 7.5-325 | 15 days supply | Qty: 90 | Fill #0

## 2016-04-09 NOTE — Telephone Encounter (Signed)
Pt left message stating he needs something strong for leg pain and back pain,  "cant' hardly walk". Saw Dr Alvy Bimler yesterday.  Increase prednisone to 20 mg, will prescribe oxycodone 7.5 mg prn pain

## 2016-04-19 ENCOUNTER — Ambulatory Visit (HOSPITAL_BASED_OUTPATIENT_CLINIC_OR_DEPARTMENT_OTHER): Payer: Medicare Other

## 2016-04-19 ENCOUNTER — Other Ambulatory Visit (HOSPITAL_BASED_OUTPATIENT_CLINIC_OR_DEPARTMENT_OTHER): Payer: Medicare Other

## 2016-04-19 ENCOUNTER — Other Ambulatory Visit: Payer: Self-pay | Admitting: Hematology and Oncology

## 2016-04-19 VITALS — BP 110/57 | HR 76 | Temp 98.6°F | Resp 16

## 2016-04-19 DIAGNOSIS — D469 Myelodysplastic syndrome, unspecified: Secondary | ICD-10-CM | POA: Diagnosis not present

## 2016-04-19 DIAGNOSIS — D61818 Other pancytopenia: Secondary | ICD-10-CM

## 2016-04-19 DIAGNOSIS — D4620 Refractory anemia with excess of blasts, unspecified: Secondary | ICD-10-CM | POA: Diagnosis present

## 2016-04-19 LAB — CBC WITH DIFFERENTIAL/PLATELET
BASO%: 0.4 % (ref 0.0–2.0)
Basophils Absolute: 0 10*3/uL (ref 0.0–0.1)
EOS%: 0 % (ref 0.0–7.0)
Eosinophils Absolute: 0 10*3/uL (ref 0.0–0.5)
HCT: 22.9 % — ABNORMAL LOW (ref 38.4–49.9)
HGB: 7.4 g/dL — ABNORMAL LOW (ref 13.0–17.1)
LYMPH%: 14 % (ref 14.0–49.0)
MCH: 27.1 pg — ABNORMAL LOW (ref 27.2–33.4)
MCHC: 32.3 g/dL (ref 32.0–36.0)
MCV: 83.9 fL (ref 79.3–98.0)
MONO#: 2.4 10*3/uL — ABNORMAL HIGH (ref 0.1–0.9)
MONO%: 26.8 % — AB (ref 0.0–14.0)
NEUT%: 58.8 % (ref 39.0–75.0)
NEUTROS ABS: 5.2 10*3/uL (ref 1.5–6.5)
NRBC: 1 % — AB (ref 0–0)
Platelets: 27 10*3/uL — ABNORMAL LOW (ref 140–400)
RBC: 2.73 10*6/uL — AB (ref 4.20–5.82)
RDW: 17.1 % — AB (ref 11.0–14.6)
WBC: 8.9 10*3/uL (ref 4.0–10.3)
lymph#: 1.3 10*3/uL (ref 0.9–3.3)

## 2016-04-19 LAB — PREPARE RBC (CROSSMATCH)

## 2016-04-19 LAB — TECHNOLOGIST REVIEW

## 2016-04-19 MED ORDER — ACETAMINOPHEN 325 MG PO TABS
ORAL_TABLET | ORAL | Status: AC
  Filled 2016-04-19: qty 2

## 2016-04-19 MED ORDER — SODIUM CHLORIDE 0.9 % IV SOLN
250.0000 mL | Freq: Once | INTRAVENOUS | Status: AC
  Administered 2016-04-19: 250 mL via INTRAVENOUS

## 2016-04-19 MED ORDER — DIPHENHYDRAMINE HCL 25 MG PO CAPS
ORAL_CAPSULE | ORAL | Status: AC
  Filled 2016-04-19: qty 1

## 2016-04-19 MED ORDER — DIPHENHYDRAMINE HCL 25 MG PO CAPS
25.0000 mg | ORAL_CAPSULE | Freq: Once | ORAL | Status: AC
  Administered 2016-04-19: 25 mg via ORAL

## 2016-04-19 MED ORDER — ACETAMINOPHEN 325 MG PO TABS
650.0000 mg | ORAL_TABLET | Freq: Once | ORAL | Status: AC
  Administered 2016-04-19: 650 mg via ORAL

## 2016-04-19 NOTE — Patient Instructions (Signed)
Blood Transfusion, Care After °Refer to this sheet in the next few weeks. These instructions provide you with information about caring for yourself after your procedure. Your health care provider may also give you more specific instructions. Your treatment has been planned according to current medical practices, but problems sometimes occur. Call your health care provider if you have any problems or questions after your procedure. °WHAT TO EXPECT AFTER THE PROCEDURE °After your procedure, it is common to have: °· Bruising and soreness at the IV site. °· Chills or fever. °· Headache. °HOME CARE INSTRUCTIONS °· Take medicines only as directed by your health care provider. Ask your health care provider if you can take an over-the-counter pain reliever in case you have a fever or headache a day or two after your transfusion. °· Return to your normal activities as directed by your health care provider. °SEEK MEDICAL CARE IF:  °· You develop redness or irritation at your IV site. °· You have persistent fever, chills, or headache. °· Your urine is darker than normal. °· Your urine turns pink, red, or brown.   °· The white part of your eye turns yellow (jaundice).   °· You feel weak after doing your normal activities.   °SEEK IMMEDIATE MEDICAL CARE IF:  °· You have trouble breathing. °· You have fever and chills along with: °¨ Anxiety. °¨ Chest or back pain. °¨ Flushed skin. °¨ Clammy skin. °¨ A rapid heartbeat. °¨ Nausea. °  °This information is not intended to replace advice given to you by your health care provider. Make sure you discuss any questions you have with your health care provider. °  °Document Released: 12/13/2014 Document Reviewed: 12/13/2014 °Elsevier Interactive Patient Education ©2016 [employer] ° °

## 2016-04-20 LAB — TYPE AND SCREEN
ABO/RH(D): O POS
Antibody Screen: NEGATIVE
UNIT DIVISION: 0
Unit division: 0

## 2016-04-29 ENCOUNTER — Other Ambulatory Visit (HOSPITAL_BASED_OUTPATIENT_CLINIC_OR_DEPARTMENT_OTHER): Payer: Medicare Other

## 2016-04-29 ENCOUNTER — Ambulatory Visit (HOSPITAL_BASED_OUTPATIENT_CLINIC_OR_DEPARTMENT_OTHER): Payer: Medicare Other

## 2016-04-29 ENCOUNTER — Other Ambulatory Visit: Payer: Self-pay | Admitting: Hematology and Oncology

## 2016-04-29 VITALS — BP 95/50 | HR 69 | Temp 97.8°F | Resp 18

## 2016-04-29 DIAGNOSIS — D469 Myelodysplastic syndrome, unspecified: Secondary | ICD-10-CM | POA: Diagnosis not present

## 2016-04-29 DIAGNOSIS — D4620 Refractory anemia with excess of blasts, unspecified: Secondary | ICD-10-CM

## 2016-04-29 DIAGNOSIS — D61818 Other pancytopenia: Secondary | ICD-10-CM

## 2016-04-29 LAB — CBC WITH DIFFERENTIAL/PLATELET
BASO%: 0.7 % (ref 0.0–2.0)
Basophils Absolute: 0.1 10*3/uL (ref 0.0–0.1)
EOS%: 0 % (ref 0.0–7.0)
Eosinophils Absolute: 0 10*3/uL (ref 0.0–0.5)
HCT: 26 % — ABNORMAL LOW (ref 38.4–49.9)
HGB: 8.6 g/dL — ABNORMAL LOW (ref 13.0–17.1)
LYMPH%: 13.1 % — AB (ref 14.0–49.0)
MCH: 27.4 pg (ref 27.2–33.4)
MCHC: 33.2 g/dL (ref 32.0–36.0)
MCV: 82.6 fL (ref 79.3–98.0)
MONO#: 1.1 10*3/uL — AB (ref 0.1–0.9)
MONO%: 14 % (ref 0.0–14.0)
NEUT%: 72.2 % (ref 39.0–75.0)
NEUTROS ABS: 5.9 10*3/uL (ref 1.5–6.5)
Platelets: 19 10*3/uL — ABNORMAL LOW (ref 140–400)
RBC: 3.14 10*6/uL — AB (ref 4.20–5.82)
RDW: 17.2 % — ABNORMAL HIGH (ref 11.0–14.6)
WBC: 8.1 10*3/uL (ref 4.0–10.3)
lymph#: 1.1 10*3/uL (ref 0.9–3.3)
nRBC: 0 % (ref 0–0)

## 2016-04-29 LAB — HOLD TUBE, BLOOD BANK

## 2016-04-29 LAB — TECHNOLOGIST REVIEW

## 2016-04-29 LAB — PREPARE RBC (CROSSMATCH)

## 2016-04-29 MED ORDER — ACETAMINOPHEN 325 MG PO TABS
ORAL_TABLET | ORAL | Status: AC
  Filled 2016-04-29: qty 2

## 2016-04-29 MED ORDER — DIPHENHYDRAMINE HCL 25 MG PO CAPS
25.0000 mg | ORAL_CAPSULE | Freq: Once | ORAL | Status: AC
  Administered 2016-04-29: 25 mg via ORAL

## 2016-04-29 MED ORDER — SODIUM CHLORIDE 0.9 % IV SOLN
250.0000 mL | Freq: Once | INTRAVENOUS | Status: AC
  Administered 2016-04-29: 250 mL via INTRAVENOUS

## 2016-04-29 MED ORDER — ACETAMINOPHEN 325 MG PO TABS
650.0000 mg | ORAL_TABLET | Freq: Once | ORAL | Status: AC
  Administered 2016-04-29: 650 mg via ORAL

## 2016-04-29 MED ORDER — DIPHENHYDRAMINE HCL 25 MG PO CAPS
ORAL_CAPSULE | ORAL | Status: AC
  Filled 2016-04-29: qty 1

## 2016-04-29 NOTE — Patient Instructions (Signed)
Blood Transfusion, Care After °Refer to this sheet in the next few weeks. These instructions provide you with information about caring for yourself after your procedure. Your health care provider may also give you more specific instructions. Your treatment has been planned according to current medical practices, but problems sometimes occur. Call your health care provider if you have any problems or questions after your procedure. °WHAT TO EXPECT AFTER THE PROCEDURE °After your procedure, it is common to have: °· Bruising and soreness at the IV site. °· Chills or fever. °· Headache. °HOME CARE INSTRUCTIONS °· Take medicines only as directed by your health care provider. Ask your health care provider if you can take an over-the-counter pain reliever in case you have a fever or headache a day or two after your transfusion. °· Return to your normal activities as directed by your health care provider. °SEEK MEDICAL CARE IF:  °· You develop redness or irritation at your IV site. °· You have persistent fever, chills, or headache. °· Your urine is darker than normal. °· Your urine turns pink, red, or brown.   °· The white part of your eye turns yellow (jaundice).   °· You feel weak after doing your normal activities.   °SEEK IMMEDIATE MEDICAL CARE IF:  °· You have trouble breathing. °· You have fever and chills along with: °¨ Anxiety. °¨ Chest or back pain. °¨ Flushed skin. °¨ Clammy skin. °¨ A rapid heartbeat. °¨ Nausea. °  °This information is not intended to replace advice given to you by your health care provider. Make sure you discuss any questions you have with your health care provider. °  °Document Released: 12/13/2014 Document Reviewed: 12/13/2014 °Elsevier Interactive Patient Education ©2016 [employer] ° °

## 2016-04-30 LAB — TYPE AND SCREEN
ABO/RH(D): O POS
Antibody Screen: NEGATIVE
Unit division: 0

## 2016-05-06 ENCOUNTER — Ambulatory Visit (HOSPITAL_COMMUNITY)
Admission: RE | Admit: 2016-05-06 | Discharge: 2016-05-06 | Disposition: A | Discharge status: Home / Self Care | Payer: Medicare Other | Source: Ambulatory Visit | Attending: Hematology and Oncology | Admitting: Hematology and Oncology

## 2016-05-06 DIAGNOSIS — D649 Anemia, unspecified: Secondary | ICD-10-CM

## 2016-05-06 DIAGNOSIS — D469 Myelodysplastic syndrome, unspecified: Secondary | ICD-10-CM

## 2016-05-10 ENCOUNTER — Other Ambulatory Visit: Payer: Self-pay | Admitting: Hematology and Oncology

## 2016-05-10 ENCOUNTER — Ambulatory Visit (HOSPITAL_BASED_OUTPATIENT_CLINIC_OR_DEPARTMENT_OTHER): Payer: Medicare Other | Admitting: Hematology and Oncology

## 2016-05-10 ENCOUNTER — Telehealth: Payer: Self-pay | Admitting: *Deleted

## 2016-05-10 ENCOUNTER — Encounter: Payer: Self-pay | Admitting: Hematology and Oncology

## 2016-05-10 ENCOUNTER — Ambulatory Visit (HOSPITAL_BASED_OUTPATIENT_CLINIC_OR_DEPARTMENT_OTHER): Payer: Medicare Other

## 2016-05-10 ENCOUNTER — Telehealth: Payer: Self-pay | Admitting: Hematology and Oncology

## 2016-05-10 ENCOUNTER — Other Ambulatory Visit (HOSPITAL_BASED_OUTPATIENT_CLINIC_OR_DEPARTMENT_OTHER): Payer: Medicare Other

## 2016-05-10 VITALS — BP 102/44 | HR 80 | Temp 97.8°F | Resp 17 | Ht 67.0 in | Wt 148.5 lb

## 2016-05-10 VITALS — BP 97/42 | HR 68 | Temp 97.9°F | Resp 18

## 2016-05-10 DIAGNOSIS — D4620 Refractory anemia with excess of blasts, unspecified: Secondary | ICD-10-CM | POA: Diagnosis present

## 2016-05-10 DIAGNOSIS — D61818 Other pancytopenia: Secondary | ICD-10-CM

## 2016-05-10 DIAGNOSIS — D469 Myelodysplastic syndrome, unspecified: Secondary | ICD-10-CM

## 2016-05-10 DIAGNOSIS — E46 Unspecified protein-calorie malnutrition: Secondary | ICD-10-CM

## 2016-05-10 DIAGNOSIS — M545 Low back pain, unspecified: Secondary | ICD-10-CM

## 2016-05-10 DIAGNOSIS — D471 Chronic myeloproliferative disease: Secondary | ICD-10-CM | POA: Diagnosis present

## 2016-05-10 DIAGNOSIS — Z515 Encounter for palliative care: Secondary | ICD-10-CM

## 2016-05-10 LAB — CBC WITH DIFFERENTIAL/PLATELET
BASO%: 0.8 % (ref 0.0–2.0)
BASOS ABS: 0.1 10*3/uL (ref 0.0–0.1)
EOS%: 0 % (ref 0.0–7.0)
Eosinophils Absolute: 0 10*3/uL (ref 0.0–0.5)
HEMATOCRIT: 20.1 % — AB (ref 38.4–49.9)
HGB: 6.6 g/dL — CL (ref 13.0–17.1)
LYMPH%: 17 % (ref 14.0–49.0)
MCH: 27.2 pg (ref 27.2–33.4)
MCHC: 32.8 g/dL (ref 32.0–36.0)
MCV: 82.7 fL (ref 79.3–98.0)
MONO#: 1.7 10*3/uL — ABNORMAL HIGH (ref 0.1–0.9)
MONO%: 22.8 % — ABNORMAL HIGH (ref 0.0–14.0)
NEUT#: 4.4 10*3/uL (ref 1.5–6.5)
NEUT%: 59.4 % (ref 39.0–75.0)
Platelets: 20 10*3/uL — ABNORMAL LOW (ref 140–400)
RBC: 2.43 10*6/uL — ABNORMAL LOW (ref 4.20–5.82)
RDW: 17.4 % — ABNORMAL HIGH (ref 11.0–14.6)
WBC: 7.5 10*3/uL (ref 4.0–10.3)
lymph#: 1.3 10*3/uL (ref 0.9–3.3)
nRBC: 1 % — ABNORMAL HIGH (ref 0–0)

## 2016-05-10 LAB — PREPARE RBC (CROSSMATCH)

## 2016-05-10 LAB — TECHNOLOGIST REVIEW

## 2016-05-10 MED ORDER — SODIUM CHLORIDE 0.9% FLUSH
10.0000 mL | INTRAVENOUS | Status: DC | PRN
  Filled 2016-05-10: qty 10

## 2016-05-10 MED ORDER — DIPHENHYDRAMINE HCL 25 MG PO CAPS
ORAL_CAPSULE | ORAL | Status: AC
  Filled 2016-05-10: qty 1

## 2016-05-10 MED ORDER — HEPARIN SOD (PORK) LOCK FLUSH 100 UNIT/ML IV SOLN
500.0000 [IU] | Freq: Every day | INTRAVENOUS | Status: DC | PRN
  Filled 2016-05-10: qty 5

## 2016-05-10 MED ORDER — SODIUM CHLORIDE 0.9% FLUSH
3.0000 mL | INTRAVENOUS | Status: DC | PRN
  Filled 2016-05-10: qty 10

## 2016-05-10 MED ORDER — SODIUM CHLORIDE 0.9 % IV SOLN
250.0000 mL | Freq: Once | INTRAVENOUS | Status: AC
  Administered 2016-05-10: 250 mL via INTRAVENOUS

## 2016-05-10 MED ORDER — HEPARIN SOD (PORK) LOCK FLUSH 100 UNIT/ML IV SOLN
250.0000 [IU] | INTRAVENOUS | Status: DC | PRN
  Filled 2016-05-10: qty 5

## 2016-05-10 MED ORDER — ACETAMINOPHEN 325 MG PO TABS
ORAL_TABLET | ORAL | Status: AC
  Filled 2016-05-10: qty 2

## 2016-05-10 MED ORDER — ACETAMINOPHEN 325 MG PO TABS
650.0000 mg | ORAL_TABLET | Freq: Once | ORAL | Status: AC
  Administered 2016-05-10: 650 mg via ORAL

## 2016-05-10 MED ORDER — DIPHENHYDRAMINE HCL 25 MG PO CAPS
25.0000 mg | ORAL_CAPSULE | Freq: Once | ORAL | Status: AC
  Administered 2016-05-10: 25 mg via ORAL

## 2016-05-10 NOTE — Progress Notes (Signed)
[place] OFFICE PROGRESS NOTE  Patient Care Team: Jonathon Jordan, MD as PCP - General (Family Medicine) Ronald Lobo, MD (Gastroenterology) Jonathon Jordan, MD as Attending Physician (Family Medicine) Heath Lark, MD as Consulting Physician (Hematology and Oncology) Roxana Hires, MD as Consulting Physician (Hematology and Oncology)  SUMMARY OF ONCOLOGIC HISTORY: Oncology History   R-IPSS score 3.5     MDS (myelodysplastic syndrome), low grade ([place])   04/26/2013 Bone Marrow Biopsy BM biopsy confirmed MDS with multilineage dysplasia, 4% blast, normal cytogenetics.   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 He returns for further follow-up. He complained of fatigue. He has easy bruising. He continues to lose weight despite attempts to increase oral intake. His back pain is under control with current prescription pain medicine and muscle relaxant.  REVIEW OF SYSTEMS:   Constitutional: Denies fevers, chills  Eyes: Denies blurriness of vision Ears, nose, mouth, throat, and face: Denies mucositis or sore throat Respiratory: Denies cough, dyspnea or wheezes Cardiovascular: Denies palpitation, chest discomfort or lower extremity swelling Gastrointestinal:  Denies nausea, heartburn or change in bowel habits Skin: Denies abnormal skin rashes Lymphatics: Denies new lymphadenopathy  Neurological:Denies numbness, tingling or new weaknesses Behavioral/Psych: Mood is stable, no new changes  All other systems were reviewed with the patient and are negative.  I have reviewed the past medical history, past surgical history, social history and family history with the patient and they are unchanged from previous  note.  ALLERGIES:  has No Known Allergies.  MEDICATIONS:  Current Outpatient Prescriptions  Medication Sig Dispense Refill  . acetaminophen (TYLENOL) 500 MG tablet Take 1,000 mg by mouth every 6 (six) hours as needed for pain.    Marland Kitchen allopurinol (ZYLOPRIM) 100 MG tablet Take 50 mg by mouth daily.     Marland Kitchen ALPRAZolam (XANAX) 0.25 MG tablet Take 0.125 mg by mouth 2 (two) times daily as needed for anxiety. Reported on 03/19/2016  0  . atorvastatin (LIPITOR) 20 MG tablet Take 1 tablet (20 mg total) by mouth daily. 90 tablet 3  . Calcium-Vitamin D (CALTRATE 600 PLUS-VIT D PO) Take 1 tablet by mouth 2 (two) times daily.     . cyclobenzaprine (FLEXERIL) 10 MG tablet Take 1 tablet (10 mg total) by mouth 3 (three) times daily as needed for muscle spasms. 60 tablet 0  . lidocaine-prilocaine (EMLA) cream Apply 1 application topically as needed. 30 g 6  . lisinopril-hydrochlorothiazide (PRINZIDE,ZESTORETIC) 10-12.5 MG per tablet Take 0.5 tablets by mouth daily. 45 tablet 2  . loratadine (CLARITIN) 10 MG tablet Take 10 mg by mouth daily.    . metoprolol (LOPRESSOR) 50 MG tablet take 1/2 tablet by mouth twice a day 30 tablet 11  . MULTIPLE VITAMINS-MINERALS PO Take 1 tablet by mouth 2 (two) times daily.    Marland Kitchen NITROSTAT 0.4 MG SL tablet Place 0.4 mg under the tongue every 5 (five) minutes as needed. Reported on 03/19/2016  0  . Omega-3 Fatty Acids (FISH OIL) 1000 MG CAPS Take 2,000 mg by mouth 2 (two) times daily.    . OxyCODONE HCl 7.5 MG TABA 1 tablet every 4 hours as needed, may increase to 2 tablets every 4 hours if neecessary 90 tablet 0  . oxyCODONE-acetaminophen (PERCOCET) 7.5-325 MG tablet Take  1 tablet by mouth every 4 (four) hours as needed for severe pain. 90 tablet 0  . predniSONE (DELTASONE) 10 MG tablet Take 20 mg (2 tablets) daily 90 tablet 0  . traMADol (ULTRAM) 50 MG tablet Take 25 mg by mouth every 4 (four) hours as needed for pain.     . traZODone (DESYREL) 100 MG tablet Take 1 tablet (100 mg  total) by mouth at bedtime as needed for sleep. 30 tablet 9   No current facility-administered medications for this visit.   Facility-Administered Medications Ordered in Other Visits  Medication Dose Route Frequency Provider Last Rate Last Dose  . 0.9 %  sodium chloride infusion  250 mL Intravenous Once Heath Lark, MD      . heparin lock flush 100 unit/mL  500 Units Intracatheter Daily PRN Heath Lark, MD      . heparin lock flush 100 unit/mL  250 Units Intracatheter PRN Heath Lark, MD      . sodium chloride flush (NS) 0.9 % injection 10 mL  10 mL Intracatheter PRN Alethia Melendrez, MD      . sodium chloride flush (NS) 0.9 % injection 3 mL  3 mL Intracatheter PRN Heath Lark, MD        PHYSICAL EXAMINATION: ECOG PERFORMANCE STATUS: 1 - Symptomatic but completely ambulatory  Filed Vitals:   05/10/16 0839  BP: 102/44  Pulse: 80  Temp: 97.8 F (36.6 C)  Resp: 17   Filed Weights   05/10/16 0839  Weight: 148 lb 8 oz (67.359 kg)    GENERAL:alert, no distress and comfortable. He looks thinner SKIN: skin color, texture, turgor are normal, no rashes or significant lesions. Multiple bruising noted. No petechiae EYES: normal, Conjunctiva are pale and non-injected, sclera clear OROPHARYNX:no exudate, no erythema and lips, buccal mucosa, and tongue normal  Musculoskeletal:no cyanosis of digits and no clubbing  NEURO: alert & oriented x 3 with fluent speech, no focal motor/sensory deficits  LABORATORY DATA:  I have reviewed the data as listed    Component Value Date/Time   NA 136 11/16/2015 2250   NA 138 05/30/2014 1138   K 3.7 11/16/2015 2250   K 3.7 05/30/2014 1138   CL 106 11/16/2015 2250   CL 102 04/09/2013 1347   CO2 20* 11/16/2015 2250   CO2 24 05/30/2014 1138   GLUCOSE 120* 11/16/2015 2250   GLUCOSE 147* 05/30/2014 1138   GLUCOSE 113* 04/09/2013 1347   BUN 16 11/16/2015 2250   BUN 26.2* 05/30/2014 1138   CREATININE 0.78 11/16/2015 2250   CREATININE 0.8 05/30/2014 1138    CALCIUM 8.6* 11/16/2015 2250   CALCIUM 9.8 05/30/2014 1138   PROT 6.9 11/16/2015 2250   PROT 7.4 11/22/2013 0847   ALBUMIN 2.7* 11/16/2015 2250   ALBUMIN 4.1 11/22/2013 0847   AST 26 11/16/2015 2250   AST 15 11/22/2013 0847   ALT 19 11/16/2015 2250   ALT 15 11/22/2013 0847   ALKPHOS 93 11/16/2015 2250   ALKPHOS 48 11/22/2013 0847   BILITOT 1.1 11/16/2015 2250   BILITOT 1.15 11/22/2013 0847   GFRNONAA >60 11/16/2015 2250   GFRAA >60 11/16/2015 2250    No results found for: SPEP, UPEP  Lab Results  Component Value Date   WBC 7.5 05/10/2016   NEUTROABS 4.4 05/10/2016   HGB 6.6* 05/10/2016   HCT 20.1* 05/10/2016   MCV 82.7 05/10/2016   PLT 20* 05/10/2016      Chemistry      Component Value Date/Time  NA 136 11/16/2015 2250   NA 138 05/30/2014 1138   K 3.7 11/16/2015 2250   K 3.7 05/30/2014 1138   CL 106 11/16/2015 2250   CL 102 04/09/2013 1347   CO2 20* 11/16/2015 2250   CO2 24 05/30/2014 1138   BUN 16 11/16/2015 2250   BUN 26.2* 05/30/2014 1138   CREATININE 0.78 11/16/2015 2250   CREATININE 0.8 05/30/2014 1138      Component Value Date/Time   CALCIUM 8.6* 11/16/2015 2250   CALCIUM 9.8 05/30/2014 1138   ALKPHOS 93 11/16/2015 2250   ALKPHOS 48 11/22/2013 0847   AST 26 11/16/2015 2250   AST 15 11/22/2013 0847   ALT 19 11/16/2015 2250   ALT 15 11/22/2013 0847   BILITOT 1.1 11/16/2015 2250   BILITOT 1.15 11/22/2013 0847     ASSESSMENT & PLAN:  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 We discussed treatment options which are limited. The patient wants to preserve quality of life. At the time of discussion with the patient, he is in agreement to just receive palliative supportive care/transfusion support. In the meantime, we will continue blood transfusion support every 10 days to keep hemoglobin above 8 g. We discussed some of the risks, benefits, and alternatives of blood transfusions. The patient is symptomatic from anemia  and the hemoglobin level is critically low.  Some of the side-effects to be expected including risks of transfusion reactions, chills, infection, syndrome of volume overload and risk of hospitalization from various reasons and the patient is willing to proceed and went ahead to sign consent today. He will get 2 units of blood whenever hemoglobin is less than 8 g.  Pancytopenia, acquired [facility]) He is not been feeling well.  He will get 2 units of blood whenever hemoglobin is less than 8 g. In terms of thrombocytopenia, he will get 1 unit of platelet whenever his blood count is less than 10,000 or have active bleeding The patient needs irradiated blood products.    Palliative care encounter We have long discussion about palliative care and end-of-life care. The patient is aware that he is not doing well. There is no clinical trial available and I would not recommending systemic chemotherapy. After much discussion, we are in agreement for supportive transfusion only. We discussed advanced directive and living will. The patient has a copy of advanced directives and will bring [place] a copy. For now, he wants supportive therapy. He agreed that his CODE STATUS is DO NOT RESUSCITATE  Pain in lower back He continues to have lower back pain. I recommend topical lidocaine cream and to continue on his prednisone therapy. I also recommend using heat pad if tolerated. He was prescribed pain medicine and muscle relaxant and he found that helpful. Continued the same  Protein calorie malnutrition ([place])  He has protein calorie malnutrition and progressive weight loss over the last 3 months This is despite increased dose prednisone and frequent small meals. Unfortunately, despite his best effort with eating frequent small meals, he continued to have progressive weight loss. I suspect this is due to disease progression.     No orders of the defined types were placed in this encounter.   All questions  were answered. The patient knows to call the clinic with any problems, questions or concerns. No barriers to learning was detected. I spent 25 minutes counseling the patient face to face. The total time spent in the appointment was 30 minutes and more than 50% was on  counseling and review of test results     [facility], [place], MD 05/10/2016 2:52 PM

## 2016-05-10 NOTE — Assessment & Plan Note (Signed)
He is not been feeling well.  He will get 2 units of blood whenever hemoglobin is less than 8 g. In terms of thrombocytopenia, he will get 1 unit of platelet whenever his blood count is less than 10,000 or have active bleeding The patient needs irradiated blood products.

## 2016-05-10 NOTE — Assessment & Plan Note (Signed)
We have long discussion about palliative care and end-of-life care. The patient is aware that he is not doing well. There is no clinical trial available and I would not recommending systemic chemotherapy. After much discussion, we are in agreement for supportive transfusion only. We discussed advanced directive and living will. The patient has a copy of advanced directives and will bring [place] a copy. For now, he wants supportive therapy. He agreed that his CODE STATUS is DO NOT RESUSCITATE

## 2016-05-10 NOTE — Telephone Encounter (Signed)
Per staff message and POF I have scheduled appts. Advised scheduler of appts. JMW  

## 2016-05-10 NOTE — Assessment & Plan Note (Signed)
He continues to have lower back pain. I recommend topical lidocaine cream and to continue on his prednisone therapy. I also recommend using heat pad if tolerated. He was prescribed pain medicine and muscle relaxant and he found that helpful. Continued the same

## 2016-05-10 NOTE — Telephone Encounter (Signed)
per of to sch pt appt-sent MW email to sch trmt-pt to get updated copy b4 leving trmt

## 2016-05-10 NOTE — Patient Instructions (Signed)
Blood Transfusion, Care After °Refer to this sheet in the next few weeks. These instructions provide you with information about caring for yourself after your procedure. Your health care provider may also give you more specific instructions. Your treatment has been planned according to current medical practices, but problems sometimes occur. Call your health care provider if you have any problems or questions after your procedure. °WHAT TO EXPECT AFTER THE PROCEDURE °After your procedure, it is common to have: °· Bruising and soreness at the IV site. °· Chills or fever. °· Headache. °HOME CARE INSTRUCTIONS °· Take medicines only as directed by your health care provider. Ask your health care provider if you can take an over-the-counter pain reliever in case you have a fever or headache a day or two after your transfusion. °· Return to your normal activities as directed by your health care provider. °SEEK MEDICAL CARE IF:  °· You develop redness or irritation at your IV site. °· You have persistent fever, chills, or headache. °· Your urine is darker than normal. °· Your urine turns pink, red, or brown.   °· The white part of your eye turns yellow (jaundice).   °· You feel weak after doing your normal activities.   °SEEK IMMEDIATE MEDICAL CARE IF:  °· You have trouble breathing. °· You have fever and chills along with: °¨ Anxiety. °¨ Chest or back pain. °¨ Flushed skin. °¨ Clammy skin. °¨ A rapid heartbeat. °¨ Nausea. °  °This information is not intended to replace advice given to you by your health care provider. Make sure you discuss any questions you have with your health care provider. °  °Document Released: 12/13/2014 Document Reviewed: 12/13/2014 °Elsevier Interactive Patient Education ©2016 [employer] ° °

## 2016-05-10 NOTE — Assessment & Plan Note (Signed)
He has protein calorie malnutrition and progressive weight loss over the last 3 months This is despite increased dose prednisone and frequent small meals. Unfortunately, despite his best effort with eating frequent small meals, he continued to have progressive weight loss. I suspect this is due to disease progression.

## 2016-05-10 NOTE — Assessment & Plan Note (Signed)
The patient is not a candidate for clinical trial. We discussed treatment options which are limited. The patient wants to preserve quality of life. At the time of discussion with the patient, he is in agreement to just receive palliative supportive care/transfusion support. In the meantime, we will continue blood transfusion support every 10 days to keep hemoglobin above 8 g. We discussed some of the risks, benefits, and alternatives of blood transfusions. The patient is symptomatic from anemia and the hemoglobin level is critically low.  Some of the side-effects to be expected including risks of transfusion reactions, chills, infection, syndrome of volume overload and risk of hospitalization from various reasons and the patient is willing to proceed and went ahead to sign consent today. He will get 2 units of blood whenever hemoglobin is less than 8 g.

## 2016-05-11 LAB — TYPE AND SCREEN
ABO/RH(D): O POS
ANTIBODY SCREEN: NEGATIVE
UNIT DIVISION: 0
UNIT DIVISION: 0

## 2016-05-20 ENCOUNTER — Other Ambulatory Visit: Payer: Self-pay | Admitting: Hematology and Oncology

## 2016-05-20 ENCOUNTER — Other Ambulatory Visit (HOSPITAL_BASED_OUTPATIENT_CLINIC_OR_DEPARTMENT_OTHER): Payer: Medicare Other

## 2016-05-20 ENCOUNTER — Ambulatory Visit (HOSPITAL_BASED_OUTPATIENT_CLINIC_OR_DEPARTMENT_OTHER): Payer: Medicare Other

## 2016-05-20 ENCOUNTER — Other Ambulatory Visit: Payer: Self-pay | Admitting: *Deleted

## 2016-05-20 VITALS — BP 104/48 | HR 64 | Temp 98.5°F | Resp 18

## 2016-05-20 DIAGNOSIS — D4620 Refractory anemia with excess of blasts, unspecified: Secondary | ICD-10-CM

## 2016-05-20 DIAGNOSIS — D649 Anemia, unspecified: Secondary | ICD-10-CM

## 2016-05-20 DIAGNOSIS — D469 Myelodysplastic syndrome, unspecified: Secondary | ICD-10-CM | POA: Diagnosis present

## 2016-05-20 DIAGNOSIS — D61818 Other pancytopenia: Secondary | ICD-10-CM

## 2016-05-20 LAB — CBC WITH DIFFERENTIAL/PLATELET
BASO%: 0.6 % (ref 0.0–2.0)
BASOS ABS: 0.1 10*3/uL (ref 0.0–0.1)
EOS%: 0 % (ref 0.0–7.0)
Eosinophils Absolute: 0 10*3/uL (ref 0.0–0.5)
HEMATOCRIT: 21.1 % — AB (ref 38.4–49.9)
HEMOGLOBIN: 6.8 g/dL — AB (ref 13.0–17.1)
LYMPH%: 14.4 % (ref 14.0–49.0)
MCH: 26.4 pg — ABNORMAL LOW (ref 27.2–33.4)
MCHC: 32.2 g/dL (ref 32.0–36.0)
MCV: 81.8 fL (ref 79.3–98.0)
MONO#: 2.4 10*3/uL — ABNORMAL HIGH (ref 0.1–0.9)
MONO%: 28.3 % — AB (ref 0.0–14.0)
NEUT%: 56.7 % (ref 39.0–75.0)
NEUTROS ABS: 4.7 10*3/uL (ref 1.5–6.5)
NRBC: 1 % — AB (ref 0–0)
PLATELETS: 19 10*3/uL — AB (ref 140–400)
RBC: 2.58 10*6/uL — ABNORMAL LOW (ref 4.20–5.82)
RDW: 19.3 % — ABNORMAL HIGH (ref 11.0–14.6)
WBC: 8.3 10*3/uL (ref 4.0–10.3)
lymph#: 1.2 10*3/uL (ref 0.9–3.3)

## 2016-05-20 LAB — TECHNOLOGIST REVIEW

## 2016-05-20 LAB — PREPARE RBC (CROSSMATCH)

## 2016-05-20 MED ORDER — OXYCODONE-ACETAMINOPHEN 7.5-325 MG PO TABS: 1.0000 | ORAL_TABLET | ORAL | Status: DC | PRN

## 2016-05-20 MED ORDER — SODIUM CHLORIDE 0.9% FLUSH
3.0000 mL | INTRAVENOUS | Status: DC | PRN
  Filled 2016-05-20: qty 10

## 2016-05-20 MED ORDER — HEPARIN SOD (PORK) LOCK FLUSH 100 UNIT/ML IV SOLN
500.0000 [IU] | Freq: Every day | INTRAVENOUS | Status: DC | PRN
  Filled 2016-05-20: qty 5

## 2016-05-20 MED ORDER — OXYCODONE HCL 7.5 MG PO TABS: ORAL_TABLET | ORAL | Status: DC

## 2016-05-20 MED ORDER — DIPHENHYDRAMINE HCL 25 MG PO CAPS
ORAL_CAPSULE | ORAL | Status: AC
  Filled 2016-05-20: qty 1

## 2016-05-20 MED ORDER — SODIUM CHLORIDE 0.9 % IV SOLN
250.0000 mL | Freq: Once | INTRAVENOUS | Status: AC
Start: 2016-05-20 — End: 2016-05-20
  Administered 2016-05-20: 250 mL via INTRAVENOUS

## 2016-05-20 MED ORDER — CYCLOBENZAPRINE HCL 10 MG PO TABS: 10.0000 mg | ORAL_TABLET | Freq: Three times a day (TID) | ORAL | Status: AC | PRN

## 2016-05-20 MED ORDER — HEPARIN SOD (PORK) LOCK FLUSH 100 UNIT/ML IV SOLN
250.0000 [IU] | INTRAVENOUS | Status: DC | PRN
  Filled 2016-05-20: qty 5

## 2016-05-20 MED ORDER — DIPHENHYDRAMINE HCL 25 MG PO CAPS
25.0000 mg | ORAL_CAPSULE | Freq: Once | ORAL | Status: AC
  Administered 2016-05-20: 25 mg via ORAL

## 2016-05-20 MED ORDER — PREDNISONE 10 MG PO TABS: ORAL_TABLET | ORAL | Status: DC

## 2016-05-20 MED ORDER — SODIUM CHLORIDE 0.9% FLUSH
10.0000 mL | INTRAVENOUS | Status: DC | PRN
  Filled 2016-05-20: qty 10

## 2016-05-20 MED ORDER — ACETAMINOPHEN 325 MG PO TABS
650.0000 mg | ORAL_TABLET | Freq: Once | ORAL | Status: AC
  Administered 2016-05-20: 650 mg via ORAL

## 2016-05-20 MED ORDER — ACETAMINOPHEN 325 MG PO TABS
ORAL_TABLET | ORAL | Status: AC
  Filled 2016-05-20: qty 2

## 2016-05-20 MED FILL — predniSONE 10 MG TABS: 10 | 45 days supply | Qty: 90 | Fill #0

## 2016-05-20 MED FILL — OXYCODONE/APAP 7.5/325MG: 7.5-325 | 15 days supply | Qty: 90 | Fill #0

## 2016-05-20 MED FILL — CYCLOBENZAPRINE 10 MG TAB: 10 | 30 days supply | Qty: 90 | Fill #0

## 2016-05-20 NOTE — Telephone Encounter (Signed)
Spoke with patient's wife, refill script for oxycodone left at front for patient p/u

## 2016-05-20 NOTE — Patient Instructions (Signed)
Blood Transfusion, Care After °Refer to this sheet in the next few weeks. These instructions provide you with information about caring for yourself after your procedure. Your health care provider may also give you more specific instructions. Your treatment has been planned according to current medical practices, but problems sometimes occur. Call your health care provider if you have any problems or questions after your procedure. °WHAT TO EXPECT AFTER THE PROCEDURE °After your procedure, it is common to have: °· Bruising and soreness at the IV site. °· Chills or fever. °· Headache. °HOME CARE INSTRUCTIONS °· Take medicines only as directed by your health care provider. Ask your health care provider if you can take an over-the-counter pain reliever in case you have a fever or headache a day or two after your transfusion. °· Return to your normal activities as directed by your health care provider. °SEEK MEDICAL CARE IF:  °· You develop redness or irritation at your IV site. °· You have persistent fever, chills, or headache. °· Your urine is darker than normal. °· Your urine turns pink, red, or brown.   °· The white part of your eye turns yellow (jaundice).   °· You feel weak after doing your normal activities.   °SEEK IMMEDIATE MEDICAL CARE IF:  °· You have trouble breathing. °· You have fever and chills along with: °¨ Anxiety. °¨ Chest or back pain. °¨ Flushed skin. °¨ Clammy skin. °¨ A rapid heartbeat. °¨ Nausea. °  °This information is not intended to replace advice given to you by your health care provider. Make sure you discuss any questions you have with your health care provider. °  °Document Released: 12/13/2014 Document Reviewed: 12/13/2014 °Elsevier Interactive Patient Education ©2016 [employer] ° °

## 2016-05-21 LAB — TYPE AND SCREEN
ABO/RH(D): O POS
Antibody Screen: NEGATIVE
Unit division: 0
Unit division: 0

## 2016-05-31 ENCOUNTER — Ambulatory Visit (HOSPITAL_BASED_OUTPATIENT_CLINIC_OR_DEPARTMENT_OTHER): Payer: Medicare Other

## 2016-05-31 ENCOUNTER — Other Ambulatory Visit (HOSPITAL_BASED_OUTPATIENT_CLINIC_OR_DEPARTMENT_OTHER): Payer: Medicare Other

## 2016-05-31 VITALS — BP 106/49 | HR 70 | Temp 98.9°F | Resp 16

## 2016-05-31 DIAGNOSIS — D61818 Other pancytopenia: Secondary | ICD-10-CM

## 2016-05-31 DIAGNOSIS — D649 Anemia, unspecified: Secondary | ICD-10-CM

## 2016-05-31 DIAGNOSIS — D4620 Refractory anemia with excess of blasts, unspecified: Secondary | ICD-10-CM

## 2016-05-31 DIAGNOSIS — D469 Myelodysplastic syndrome, unspecified: Secondary | ICD-10-CM | POA: Diagnosis not present

## 2016-05-31 LAB — CBC WITH DIFFERENTIAL/PLATELET
BASO%: 0.6 % (ref 0.0–2.0)
Basophils Absolute: 0.1 10*3/uL (ref 0.0–0.1)
EOS%: 0 % (ref 0.0–7.0)
Eosinophils Absolute: 0 10*3/uL (ref 0.0–0.5)
HEMATOCRIT: 22.8 % — AB (ref 38.4–49.9)
HGB: 7.4 g/dL — ABNORMAL LOW (ref 13.0–17.1)
LYMPH%: 19.9 % (ref 14.0–49.0)
MCH: 26.8 pg — AB (ref 27.2–33.4)
MCHC: 32.5 g/dL (ref 32.0–36.0)
MCV: 82.6 fL (ref 79.3–98.0)
MONO#: 1.4 10*3/uL — AB (ref 0.1–0.9)
MONO%: 17.7 % — ABNORMAL HIGH (ref 0.0–14.0)
NEUT#: 4.9 10*3/uL (ref 1.5–6.5)
NEUT%: 61.8 % (ref 39.0–75.0)
Platelets: 27 10*3/uL — ABNORMAL LOW (ref 140–400)
RBC: 2.76 10*6/uL — ABNORMAL LOW (ref 4.20–5.82)
RDW: 18.9 % — ABNORMAL HIGH (ref 11.0–14.6)
WBC: 7.9 10*3/uL (ref 4.0–10.3)
lymph#: 1.6 10*3/uL (ref 0.9–3.3)
nRBC: 1 % — ABNORMAL HIGH (ref 0–0)

## 2016-05-31 LAB — TECHNOLOGIST REVIEW

## 2016-05-31 LAB — PREPARE RBC (CROSSMATCH)

## 2016-05-31 MED ORDER — SODIUM CHLORIDE 0.9% FLUSH
10.0000 mL | INTRAVENOUS | Status: DC | PRN
  Filled 2016-05-31: qty 10

## 2016-05-31 MED ORDER — ACETAMINOPHEN 325 MG PO TABS
ORAL_TABLET | ORAL | Status: AC
  Filled 2016-05-31: qty 2

## 2016-05-31 MED ORDER — ACETAMINOPHEN 325 MG PO TABS
650.0000 mg | ORAL_TABLET | Freq: Once | ORAL | Status: AC
  Administered 2016-05-31: 650 mg via ORAL

## 2016-05-31 MED ORDER — DIPHENHYDRAMINE HCL 25 MG PO CAPS
ORAL_CAPSULE | ORAL | Status: AC
  Filled 2016-05-31: qty 1

## 2016-05-31 MED ORDER — DIPHENHYDRAMINE HCL 25 MG PO CAPS
25.0000 mg | ORAL_CAPSULE | Freq: Once | ORAL | Status: AC
  Administered 2016-05-31: 25 mg via ORAL

## 2016-05-31 MED ORDER — HEPARIN SOD (PORK) LOCK FLUSH 100 UNIT/ML IV SOLN
500.0000 [IU] | Freq: Every day | INTRAVENOUS | Status: DC | PRN
  Filled 2016-05-31: qty 5

## 2016-05-31 MED ORDER — SODIUM CHLORIDE 0.9 % IV SOLN
250.0000 mL | Freq: Once | INTRAVENOUS | Status: AC
  Administered 2016-05-31: 250 mL via INTRAVENOUS

## 2016-05-31 NOTE — Patient Instructions (Signed)
Blood Transfusion   A blood transfusion is a procedure that gives you donated blood through an IV tube. You may need blood because of illness, surgery, or injury. The blood may come from a donor. The blood may also be your own blood that you donated earlier.  The blood you get is made up of different types of cells. You may get:    Red blood cells. These carry oxygen and replace lost blood.    Platelets. These control bleeding.    Plasma. This helps blood to clot.  If you have a clotting disorder, you may also get other types of blood products.   BEFORE THE PROCEDURE   You may have a blood test. This finds out what type of blood you have. It also finds out what kind of blood your body will accept.    If you are going to have a planned surgery, you may donate your own blood. This is done in case you need to have a transfusion.    If you have had an allergic transfusion reaction before, you may be given medicine to help prevent a reaction. Take this medicine only as told by your doctor.   You will have your temperature, blood pressure, and pulse checked.  PROCEDURE    An IV will be started in your hand or arm.    The bag of donated blood will be attached to your IV and run into your vein.    A doctor will regularly check your temperature, blood pressure, and pulse during the procedure. This is done to find any early signs of a transfusion reaction.   If you have any signs or symptoms of a reaction, the procedure may be stopped and you may be given medicine.    When the transfusion is over, your IV will be removed.    Pressure may be applied to the IV site for a few minutes.    A bandage (dressing) will be applied.   The procedure may vary among doctors and hospitals.   AFTER THE PROCEDURE   Your blood pressure, temperature, and pulse will be checked regularly.     This information is not intended to replace advice given to you by your health care provider. Make sure you discuss any questions  you have with your health care provider.     Document Released: 02/18/2009 Document Revised: 12/13/2014 Document Reviewed: 10/02/2014  Elsevier Interactive Patient Education 2016 [employer]

## 2016-06-01 LAB — TYPE AND SCREEN
ABO/RH(D): O POS
ANTIBODY SCREEN: NEGATIVE
UNIT DIVISION: 0
Unit division: 0

## 2016-06-07 ENCOUNTER — Ambulatory Visit (HOSPITAL_COMMUNITY)
Admission: RE | Admit: 2016-06-07 | Discharge: 2016-06-07 | Disposition: A | Discharge status: Home / Self Care | Payer: Medicare Other | Source: Ambulatory Visit | Attending: Hematology and Oncology | Admitting: Hematology and Oncology

## 2016-06-07 DIAGNOSIS — D469 Myelodysplastic syndrome, unspecified: Secondary | ICD-10-CM

## 2016-06-07 DIAGNOSIS — D4620 Refractory anemia with excess of blasts, unspecified: Secondary | ICD-10-CM | POA: Insufficient documentation

## 2016-06-10 ENCOUNTER — Other Ambulatory Visit (HOSPITAL_BASED_OUTPATIENT_CLINIC_OR_DEPARTMENT_OTHER): Payer: Medicare Other

## 2016-06-10 ENCOUNTER — Other Ambulatory Visit: Payer: Self-pay | Admitting: Hematology and Oncology

## 2016-06-10 ENCOUNTER — Ambulatory Visit (HOSPITAL_BASED_OUTPATIENT_CLINIC_OR_DEPARTMENT_OTHER): Payer: Medicare Other

## 2016-06-10 VITALS — BP 97/46 | HR 72 | Temp 97.5°F | Resp 16

## 2016-06-10 DIAGNOSIS — D4620 Refractory anemia with excess of blasts, unspecified: Secondary | ICD-10-CM

## 2016-06-10 DIAGNOSIS — D469 Myelodysplastic syndrome, unspecified: Secondary | ICD-10-CM

## 2016-06-10 DIAGNOSIS — D61818 Other pancytopenia: Secondary | ICD-10-CM

## 2016-06-10 LAB — CBC WITH DIFFERENTIAL/PLATELET
BASO%: 0.6 % (ref 0.0–2.0)
Basophils Absolute: 0.1 10*3/uL (ref 0.0–0.1)
EOS%: 0 % (ref 0.0–7.0)
Eosinophils Absolute: 0 10*3/uL (ref 0.0–0.5)
HEMATOCRIT: 23.7 % — AB (ref 38.4–49.9)
HGB: 7.7 g/dL — ABNORMAL LOW (ref 13.0–17.1)
LYMPH%: 21.2 % (ref 14.0–49.0)
MCH: 26.6 pg — AB (ref 27.2–33.4)
MCHC: 32.5 g/dL (ref 32.0–36.0)
MCV: 82 fL (ref 79.3–98.0)
MONO#: 1.6 10*3/uL — ABNORMAL HIGH (ref 0.1–0.9)
MONO%: 20 % — AB (ref 0.0–14.0)
NEUT%: 58.2 % (ref 39.0–75.0)
NEUTROS ABS: 4.7 10*3/uL (ref 1.5–6.5)
Platelets: 27 10*3/uL — ABNORMAL LOW (ref 140–400)
RBC: 2.89 10*6/uL — AB (ref 4.20–5.82)
RDW: 18.2 % — ABNORMAL HIGH (ref 11.0–14.6)
WBC: 8.2 10*3/uL (ref 4.0–10.3)
lymph#: 1.7 10*3/uL (ref 0.9–3.3)
nRBC: 1 % — ABNORMAL HIGH (ref 0–0)

## 2016-06-10 LAB — PREPARE RBC (CROSSMATCH)

## 2016-06-10 LAB — TECHNOLOGIST REVIEW

## 2016-06-10 MED ORDER — ACETAMINOPHEN 325 MG PO TABS
ORAL_TABLET | ORAL | Status: AC
  Filled 2016-06-10: qty 2

## 2016-06-10 MED ORDER — ACETAMINOPHEN 325 MG PO TABS
650.0000 mg | ORAL_TABLET | Freq: Once | ORAL | Status: AC
  Administered 2016-06-10: 650 mg via ORAL

## 2016-06-10 MED ORDER — DIPHENHYDRAMINE HCL 25 MG PO CAPS
25.0000 mg | ORAL_CAPSULE | Freq: Once | ORAL | Status: AC
  Administered 2016-06-10: 25 mg via ORAL

## 2016-06-10 MED ORDER — DIPHENHYDRAMINE HCL 25 MG PO CAPS
ORAL_CAPSULE | ORAL | Status: AC
  Filled 2016-06-10: qty 1

## 2016-06-10 MED ORDER — SODIUM CHLORIDE 0.9 % IV SOLN
250.0000 mL | Freq: Once | INTRAVENOUS | Status: AC
  Administered 2016-06-10: 250 mL via INTRAVENOUS

## 2016-06-10 NOTE — Patient Instructions (Addendum)
Blood Transfusion, Care After °Refer to this sheet in the next few weeks. These instructions provide you with information about caring for yourself after your procedure. Your health care provider may also give you more specific instructions. Your treatment has been planned according to current medical practices, but problems sometimes occur. Call your health care provider if you have any problems or questions after your procedure. °WHAT TO EXPECT AFTER THE PROCEDURE °After your procedure, it is common to have: °· Bruising and soreness at the IV site. °· Chills or fever. °· Headache. °HOME CARE INSTRUCTIONS °· Take medicines only as directed by your health care provider. Ask your health care provider if you can take an over-the-counter pain reliever in case you have a fever or headache a day or two after your transfusion. °· Return to your normal activities as directed by your health care provider. °SEEK MEDICAL CARE IF:  °· You develop redness or irritation at your IV site. °· You have persistent fever, chills, or headache. °· Your urine is darker than normal. °· Your urine turns pink, red, or brown.   °· The white part of your eye turns yellow (jaundice).   °· You feel weak after doing your normal activities.   °SEEK IMMEDIATE MEDICAL CARE IF:  °· You have trouble breathing. °· You have fever and chills along with: °¨ Anxiety. °¨ Chest or back pain. °¨ Flushed skin. °¨ Clammy skin. °¨ A rapid heartbeat. °¨ Nausea. °  °This information is not intended to replace advice given to you by your health care provider. Make sure you discuss any questions you have with your health care provider. °  °Document Released: 12/13/2014 Document Reviewed: 12/13/2014 °Elsevier Interactive Patient Education ©2016 [employer] ° °

## 2016-06-11 LAB — TYPE AND SCREEN
ABO/RH(D): O POS
ANTIBODY SCREEN: NEGATIVE
UNIT DIVISION: 0
Unit division: 0

## 2016-06-12 ENCOUNTER — Other Ambulatory Visit: Payer: Self-pay | Admitting: Interventional Cardiology

## 2016-06-13 ENCOUNTER — Other Ambulatory Visit: Payer: Self-pay | Admitting: Interventional Cardiology

## 2016-06-21 ENCOUNTER — Encounter: Payer: Self-pay | Admitting: Hematology and Oncology

## 2016-06-21 ENCOUNTER — Other Ambulatory Visit: Payer: Self-pay | Admitting: Hematology and Oncology

## 2016-06-21 ENCOUNTER — Telehealth: Payer: Self-pay | Admitting: Hematology and Oncology

## 2016-06-21 ENCOUNTER — Other Ambulatory Visit (HOSPITAL_BASED_OUTPATIENT_CLINIC_OR_DEPARTMENT_OTHER): Payer: Medicare Other

## 2016-06-21 ENCOUNTER — Ambulatory Visit (HOSPITAL_BASED_OUTPATIENT_CLINIC_OR_DEPARTMENT_OTHER): Payer: Medicare Other | Admitting: Hematology and Oncology

## 2016-06-21 ENCOUNTER — Ambulatory Visit: Payer: Medicare Other

## 2016-06-21 VITALS — BP 115/46 | HR 67 | Temp 98.2°F | Resp 18 | Ht 67.0 in | Wt 151.1 lb

## 2016-06-21 VITALS — BP 103/32 | HR 71 | Temp 98.4°F | Resp 16

## 2016-06-21 DIAGNOSIS — M0579 Rheumatoid arthritis with rheumatoid factor of multiple sites without organ or systems involvement: Secondary | ICD-10-CM | POA: Diagnosis not present

## 2016-06-21 DIAGNOSIS — D4620 Refractory anemia with excess of blasts, unspecified: Secondary | ICD-10-CM

## 2016-06-21 DIAGNOSIS — D471 Chronic myeloproliferative disease: Secondary | ICD-10-CM

## 2016-06-21 DIAGNOSIS — M545 Low back pain, unspecified: Secondary | ICD-10-CM

## 2016-06-21 DIAGNOSIS — D61818 Other pancytopenia: Secondary | ICD-10-CM

## 2016-06-21 DIAGNOSIS — D469 Myelodysplastic syndrome, unspecified: Secondary | ICD-10-CM | POA: Diagnosis not present

## 2016-06-21 DIAGNOSIS — Z7189 Other specified counseling: Secondary | ICD-10-CM | POA: Insufficient documentation

## 2016-06-21 LAB — CBC WITH DIFFERENTIAL/PLATELET
BASO%: 1.3 % (ref 0.0–2.0)
Basophils Absolute: 0.2 10*3/uL — ABNORMAL HIGH (ref 0.0–0.1)
EOS%: 0.1 % (ref 0.0–7.0)
Eosinophils Absolute: 0 10*3/uL (ref 0.0–0.5)
HEMATOCRIT: 25.3 % — AB (ref 38.4–49.9)
HEMOGLOBIN: 8.3 g/dL — AB (ref 13.0–17.1)
LYMPH#: 1.3 10*3/uL (ref 0.9–3.3)
LYMPH%: 10.5 % — ABNORMAL LOW (ref 14.0–49.0)
MCH: 26.7 pg — ABNORMAL LOW (ref 27.2–33.4)
MCHC: 32.9 g/dL (ref 32.0–36.0)
MCV: 81.1 fL (ref 79.3–98.0)
MONO#: 2.8 10*3/uL — AB (ref 0.1–0.9)
MONO%: 22.3 % — ABNORMAL HIGH (ref 0.0–14.0)
NEUT%: 65.8 % (ref 39.0–75.0)
NEUTROS ABS: 8.2 10*3/uL — AB (ref 1.5–6.5)
Platelets: 27 10*3/uL — ABNORMAL LOW (ref 140–400)
RBC: 3.12 10*6/uL — ABNORMAL LOW (ref 4.20–5.82)
RDW: 18.6 % — AB (ref 11.0–14.6)
WBC: 12.5 10*3/uL — ABNORMAL HIGH (ref 4.0–10.3)

## 2016-06-21 LAB — TECHNOLOGIST REVIEW

## 2016-06-21 LAB — PREPARE RBC (CROSSMATCH)

## 2016-06-21 MED ORDER — DIPHENHYDRAMINE HCL 25 MG PO CAPS
ORAL_CAPSULE | ORAL | Status: AC
  Filled 2016-06-21: qty 1

## 2016-06-21 MED ORDER — ACETAMINOPHEN 325 MG PO TABS
650.0000 mg | ORAL_TABLET | Freq: Once | ORAL | Status: AC
  Administered 2016-06-21: 650 mg via ORAL

## 2016-06-21 MED ORDER — SODIUM CHLORIDE 0.9 % IV SOLN
250.0000 mL | Freq: Once | INTRAVENOUS | Status: AC
  Administered 2016-06-21: 250 mL via INTRAVENOUS

## 2016-06-21 MED ORDER — ACETAMINOPHEN 325 MG PO TABS
ORAL_TABLET | ORAL | Status: AC
  Filled 2016-06-21: qty 2

## 2016-06-21 MED ORDER — DIPHENHYDRAMINE HCL 25 MG PO CAPS
25.0000 mg | ORAL_CAPSULE | Freq: Once | ORAL | Status: AC
  Administered 2016-06-21: 25 mg via ORAL

## 2016-06-21 NOTE — Assessment & Plan Note (Signed)
The patient is not a candidate for clinical trial. We discussed treatment options which are limited. The patient wants to preserve quality of life. At the time of discussion with the patient, he is in agreement to just receive palliative supportive care/transfusion support. In the meantime, we will continue blood transfusion support every 10 days to keep hemoglobin above 8 g. We discussed some of the risks, benefits, and alternatives of blood transfusions. The patient is symptomatic from anemia and the hemoglobin level is critically low.  Some of the side-effects to be expected including risks of transfusion reactions, chills, infection, syndrome of volume overload and risk of hospitalization from various reasons and the patient is willing to proceed and went ahead to sign consent today. He will get 2 units of blood whenever hemoglobin is less than 8 g. He does not need platelet transfusion as he is asymptomatic from bleeding. We will only give him 1 unit of platelets if platelet count is less than 10,000 or if he has new onset of bleeding.

## 2016-06-21 NOTE — Progress Notes (Signed)
Per Tammi RN per Dr. Alvy Bimler pt to receive 2 units of PRBCs today 06/21/16.

## 2016-06-21 NOTE — Assessment & Plan Note (Signed)
He continues to have lower back pain. I recommend topical lidocaine cream and to continue on his prednisone therapy. I also recommend using heat pad if tolerated. He was prescribed pain medicine and muscle relaxant and he found that helpful. Continued the same

## 2016-06-21 NOTE — Telephone Encounter (Signed)
per pof to sch pt appt-gave pt copy of avs °

## 2016-06-21 NOTE — Progress Notes (Signed)
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 He returns for further follow-up. He is doing well with transfusion support every 10 days. His appetite is poor but he continues to eat frequent small meals. He has no further recent weight loss. The patient denies any recent signs or symptoms of bleeding such as spontaneous epistaxis, hematuria or hematochezia. He is taking pain medicine sporadically for his back pain. He is also getting some massage therapy as needed. He feels as well as he could be and understand the palliative nature of his current therapy. He denies recent infection  REVIEW OF SYSTEMS:   Constitutional: Denies fevers, chills or abnormal weight loss Eyes: Denies blurriness of vision Ears, nose, mouth, throat, and face: Denies mucositis or sore throat Respiratory: Denies cough, dyspnea or wheezes Cardiovascular: Denies palpitation, chest discomfort or lower extremity swelling Gastrointestinal:  Denies nausea, heartburn or change in bowel habits Skin: Denies abnormal skin rashes Lymphatics: Denies new lymphadenopathy or easy  bruising Neurological:Denies numbness, tingling or new weaknesses Behavioral/Psych: Mood is stable, no new changes  All other systems were reviewed with the patient and are negative.  I have reviewed the past medical history, past surgical history, social history and family history with the patient and they are unchanged from previous note.  ALLERGIES:  has No Known Allergies.  MEDICATIONS:  Current Outpatient Prescriptions  Medication Sig Dispense Refill  . allopurinol (ZYLOPRIM) 100 MG tablet Take 50 mg by mouth daily.     Marland Kitchen ALPRAZolam (XANAX) 0.5 MG tablet   0  . atorvastatin (LIPITOR) 20 MG tablet Take 1 tablet (20 mg total) by mouth daily. 90 tablet 3  . Calcium-Vitamin D (CALTRATE 600 PLUS-VIT D PO) Take 1 tablet by mouth 2 (two) times daily.     . cyclobenzaprine (FLEXERIL) 10 MG tablet Take 1 tablet (10 mg total) by mouth 3 (three) times daily as needed for muscle spasms. 90 tablet 6  . lisinopril-hydrochlorothiazide (PRINZIDE,ZESTORETIC) 10-12.5 MG tablet take 1/2 tablet by mouth once daily 45 tablet 1  . loratadine (CLARITIN) 10 MG tablet Take 10 mg by mouth daily.    . metoprolol (LOPRESSOR) 50 MG tablet take 1/2 tablet by mouth twice a day 30 tablet 11  . MULTIPLE VITAMINS-MINERALS PO Take 1 tablet by mouth 2 (two) times daily. Reported on 06/21/2016    . Omega-3 Fatty Acids (FISH OIL) 1000 MG CAPS Take 2,000 mg by mouth 2 (two) times daily.    . predniSONE (DELTASONE) 10 MG tablet Take 20 mg (2 tablets) daily 90 tablet 6  . traMADol (ULTRAM) 50 MG tablet Take 25 mg by mouth every 4 (four) hours as needed for pain.     Marland Kitchen acetaminophen (TYLENOL) 500 MG tablet Take 1,000 mg  by mouth every 6 (six) hours as needed for pain. Reported on 06/21/2016    . lidocaine-prilocaine (EMLA) cream Apply 1 application topically as needed. (Patient not taking: Reported on 06/21/2016) 30 g 6  . NITROSTAT 0.4 MG SL tablet Place 0.4 mg under the tongue every 5 (five) minutes as needed. Reported on  06/21/2016  0  . OxyCODONE HCl 7.5 MG TABA 1 tablet every 4 hours as needed, may increase to 2 tablets every 4 hours if neecessary (Patient not taking: Reported on 06/21/2016) 90 tablet 0  . oxyCODONE-acetaminophen (PERCOCET) 7.5-325 MG tablet Take 1 tablet by mouth every 4 (four) hours as needed for severe pain. (Patient not taking: Reported on 06/21/2016) 90 tablet 0  . traZODone (DESYREL) 100 MG tablet Take 1 tablet (100 mg total) by mouth at bedtime as needed for sleep. (Patient not taking: Reported on 06/21/2016) 30 tablet 9   No current facility-administered medications for this visit.   Facility-Administered Medications Ordered in Other Visits  Medication Dose Route Frequency Provider Last Rate Last Dose  . 0.9 %  sodium chloride infusion  250 mL Intravenous Once Heath Lark, MD        PHYSICAL EXAMINATION: ECOG PERFORMANCE STATUS: 2 - Symptomatic, <50% confined to bed  Filed Vitals:   06/21/16 0840  BP: 115/46  Pulse: 67  Temp: 98.2 F (36.8 C)  Resp: 18   Filed Weights   06/21/16 0840  Weight: 151 lb 1.6 oz (68.539 kg)    GENERAL:alert, no distress and comfortable SKIN: skin colorIs pale, texture, turgor are normal, no rashes or significant lesions. He has extensive bruises EYES: normal, Conjunctiva are pale and non-injected, sclera clear OROPHARYNX:no exudate, no erythema and lips, buccal mucosa, and tongue normal  NECK: supple, thyroid normal size, non-tender, without nodularity LYMPH:  no palpable lymphadenopathy in the cervical, axillary or inguinal LUNGS: clear to auscultation and percussion with normal breathing effort HEART: regular rate & rhythm and no murmurs and no lower extremity edema ABDOMEN:abdomen soft, non-tender and normal bowel sounds. He has palpable splenomegaly Musculoskeletal:no cyanosis of digits and no clubbing  NEURO: alert & oriented x 3 with fluent speech, no focal motor/sensory deficits  LABORATORY DATA:  I have reviewed the data as listed     Component Value Date/Time   NA 136 11/16/2015 2250   NA 138 05/30/2014 1138   K 3.7 11/16/2015 2250   K 3.7 05/30/2014 1138   CL 106 11/16/2015 2250   CL 102 04/09/2013 1347   CO2 20* 11/16/2015 2250   CO2 24 05/30/2014 1138   GLUCOSE 120* 11/16/2015 2250   GLUCOSE 147* 05/30/2014 1138   GLUCOSE 113* 04/09/2013 1347   BUN 16 11/16/2015 2250   BUN 26.2* 05/30/2014 1138   CREATININE 0.78 11/16/2015 2250   CREATININE 0.8 05/30/2014 1138   CALCIUM 8.6* 11/16/2015 2250   CALCIUM 9.8 05/30/2014 1138   PROT 6.9 11/16/2015 2250   PROT 7.4 11/22/2013 0847   ALBUMIN 2.7* 11/16/2015 2250   ALBUMIN 4.1 11/22/2013 0847   AST 26 11/16/2015 2250   AST 15 11/22/2013 0847   ALT 19 11/16/2015 2250   ALT 15 11/22/2013 0847   ALKPHOS 93 11/16/2015 2250   ALKPHOS 48 11/22/2013 0847   BILITOT 1.1 11/16/2015 2250   BILITOT 1.15 11/22/2013 0847   GFRNONAA >60 11/16/2015 2250   GFRAA >60 11/16/2015 2250    No results found for: SPEP, UPEP  Lab Results  Component Value Date   WBC 12.5* 06/21/2016  NEUTROABS 8.2* 06/21/2016   HGB 8.3* 06/21/2016   HCT 25.3* 06/21/2016   MCV 81.1 06/21/2016   PLT 27* 06/21/2016      Chemistry      Component Value Date/Time   NA 136 11/16/2015 2250   NA 138 05/30/2014 1138   K 3.7 11/16/2015 2250   K 3.7 05/30/2014 1138   CL 106 11/16/2015 2250   CL 102 04/09/2013 1347   CO2 20* 11/16/2015 2250   CO2 24 05/30/2014 1138   BUN 16 11/16/2015 2250   BUN 26.2* 05/30/2014 1138   CREATININE 0.78 11/16/2015 2250   CREATININE 0.8 05/30/2014 1138      Component Value Date/Time   CALCIUM 8.6* 11/16/2015 2250   CALCIUM 9.8 05/30/2014 1138   ALKPHOS 93 11/16/2015 2250   ALKPHOS 48 11/22/2013 0847   AST 26 11/16/2015 2250   AST 15 11/22/2013 0847   ALT 19 11/16/2015 2250   ALT 15 11/22/2013 0847   BILITOT 1.1 11/16/2015 2250   BILITOT 1.15 11/22/2013 0847      ASSESSMENT & PLAN:  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 We discussed treatment options which are limited. The patient wants to preserve quality of life. At the time of discussion with the patient, he is in agreement to just receive palliative supportive care/transfusion support. In the meantime, we will continue blood transfusion support every 10 days to keep hemoglobin above 8 g. We discussed some of the risks, benefits, and alternatives of blood transfusions. The patient is symptomatic from anemia and the hemoglobin level is critically low.  Some of the side-effects to be expected including risks of transfusion reactions, chills, infection, syndrome of volume overload and risk of hospitalization from various reasons and the patient is willing to proceed and went ahead to sign consent today. He will get 2 units of blood whenever hemoglobin is less than 8 g. He does not need platelet transfusion as he is asymptomatic from bleeding. We will only give him 1 unit of platelets if platelet count is less than 10,000 or if he has new onset of bleeding.  Pain in lower back He continues to have lower back pain. I recommend topical lidocaine cream and to continue on his prednisone therapy. I also recommend using heat pad if tolerated. He was prescribed pain medicine and muscle relaxant and he found that helpful. Continued the same  Rheumatoid arthritis We'll continue low-dose prednisone for now    DNR (do not resuscitate) discussion We have long discussion about palliative care and end-of-life care. The patient is aware that he is not doing well. There is no clinical trial available and I would not recommending systemic chemotherapy. After much discussion, we are in agreement for supportive transfusion only. We discussed advanced directive and living will. The patient has a copy of advanced directives and brought [place] a copy today. For now, he wants supportive therapy. He agreed that his CODE STATUS is DO NOT RESUSCITATE We also discussed port  placement and he declined.   No orders of the defined types were placed in this encounter.   All questions were answered. The patient knows to call the clinic with any problems, questions or concerns. No barriers to learning was detected. I spent 20 minutes counseling the patient face to face. The total time spent in the appointment was 25 minutes and more than 50% was on counseling and review of test results     [facility], [place], MD 06/21/2016 9:34 AM

## 2016-06-21 NOTE — Assessment & Plan Note (Signed)
We'll continue low-dose prednisone for now

## 2016-06-21 NOTE — Assessment & Plan Note (Signed)
We have long discussion about palliative care and end-of-life care. The patient is aware that he is not doing well. There is no clinical trial available and I would not recommending systemic chemotherapy. After much discussion, we are in agreement for supportive transfusion only. We discussed advanced directive and living will. The patient has a copy of advanced directives and brought [place] a copy today. For now, he wants supportive therapy. He agreed that his CODE STATUS is DO NOT RESUSCITATE We also discussed port placement and he declined.

## 2016-06-21 NOTE — Patient Instructions (Signed)

## 2016-06-22 LAB — TYPE AND SCREEN
ABO/RH(D): O POS
Antibody Screen: NEGATIVE
Unit division: 0
Unit division: 0

## 2016-07-02 ENCOUNTER — Ambulatory Visit (HOSPITAL_BASED_OUTPATIENT_CLINIC_OR_DEPARTMENT_OTHER): Payer: Medicare Other

## 2016-07-02 ENCOUNTER — Other Ambulatory Visit (HOSPITAL_BASED_OUTPATIENT_CLINIC_OR_DEPARTMENT_OTHER): Payer: Medicare Other

## 2016-07-02 VITALS — BP 112/38 | HR 74 | Temp 98.6°F | Resp 16

## 2016-07-02 DIAGNOSIS — D469 Myelodysplastic syndrome, unspecified: Secondary | ICD-10-CM

## 2016-07-02 DIAGNOSIS — D4620 Refractory anemia with excess of blasts, unspecified: Secondary | ICD-10-CM

## 2016-07-02 DIAGNOSIS — D471 Chronic myeloproliferative disease: Secondary | ICD-10-CM

## 2016-07-02 DIAGNOSIS — D61818 Other pancytopenia: Secondary | ICD-10-CM

## 2016-07-02 LAB — CBC WITH DIFFERENTIAL/PLATELET
BASO%: 0.6 % (ref 0.0–2.0)
Basophils Absolute: 0.1 10*3/uL (ref 0.0–0.1)
EOS ABS: 0 10*3/uL (ref 0.0–0.5)
EOS%: 0 % (ref 0.0–7.0)
HEMATOCRIT: 23 % — AB (ref 38.4–49.9)
HGB: 7.6 g/dL — ABNORMAL LOW (ref 13.0–17.1)
LYMPH%: 27.8 % (ref 14.0–49.0)
MCH: 27.1 pg — AB (ref 27.2–33.4)
MCHC: 33 g/dL (ref 32.0–36.0)
MCV: 82.1 fL (ref 79.3–98.0)
MONO#: 0.9 10*3/uL (ref 0.1–0.9)
MONO%: 10.2 % (ref 0.0–14.0)
NEUT#: 5.3 10*3/uL (ref 1.5–6.5)
NEUT%: 61.4 % (ref 39.0–75.0)
PLATELETS: 23 10*3/uL — AB (ref 140–400)
RBC: 2.8 10*6/uL — ABNORMAL LOW (ref 4.20–5.82)
RDW: 18.9 % — ABNORMAL HIGH (ref 11.0–14.6)
WBC: 8.6 10*3/uL (ref 4.0–10.3)
lymph#: 2.4 10*3/uL (ref 0.9–3.3)
nRBC: 1 % — ABNORMAL HIGH (ref 0–0)

## 2016-07-02 LAB — TECHNOLOGIST REVIEW

## 2016-07-02 LAB — PREPARE RBC (CROSSMATCH)

## 2016-07-02 MED ORDER — SODIUM CHLORIDE 0.9 % IV SOLN: 250.0000 mL | Freq: Once | INTRAVENOUS | Status: DC

## 2016-07-02 MED ORDER — ACETAMINOPHEN 325 MG PO TABS
ORAL_TABLET | ORAL | Status: AC
  Filled 2016-07-02: qty 2

## 2016-07-02 MED ORDER — SODIUM CHLORIDE 0.9% FLUSH
3.0000 mL | INTRAVENOUS | Status: DC | PRN
  Filled 2016-07-02: qty 10

## 2016-07-02 MED ORDER — ACETAMINOPHEN 325 MG PO TABS
650.0000 mg | ORAL_TABLET | Freq: Once | ORAL | Status: AC
  Administered 2016-07-02: 650 mg via ORAL

## 2016-07-02 MED ORDER — DIPHENHYDRAMINE HCL 25 MG PO CAPS
ORAL_CAPSULE | ORAL | Status: AC
  Filled 2016-07-02: qty 1

## 2016-07-02 MED ORDER — DIPHENHYDRAMINE HCL 25 MG PO CAPS
25.0000 mg | ORAL_CAPSULE | Freq: Once | ORAL | Status: AC
  Administered 2016-07-02: 25 mg via ORAL

## 2016-07-02 NOTE — Patient Instructions (Signed)
Blood Transfusion, Care After °Refer to this sheet in the next few weeks. These instructions provide you with information about caring for yourself after your procedure. Your health care provider may also give you more specific instructions. Your treatment has been planned according to current medical practices, but problems sometimes occur. Call your health care provider if you have any problems or questions after your procedure. °WHAT TO EXPECT AFTER THE PROCEDURE °After your procedure, it is common to have: °· Bruising and soreness at the IV site. °· Chills or fever. °· Headache. °HOME CARE INSTRUCTIONS °· Take medicines only as directed by your health care provider. Ask your health care provider if you can take an over-the-counter pain reliever in case you have a fever or headache a day or two after your transfusion. °· Return to your normal activities as directed by your health care provider. °SEEK MEDICAL CARE IF:  °· You develop redness or irritation at your IV site. °· You have persistent fever, chills, or headache. °· Your urine is darker than normal. °· Your urine turns pink, red, or brown.   °· The white part of your eye turns yellow (jaundice).   °· You feel weak after doing your normal activities.   °SEEK IMMEDIATE MEDICAL CARE IF:  °· You have trouble breathing. °· You have fever and chills along with: °¨ Anxiety. °¨ Chest or back pain. °¨ Flushed skin. °¨ Clammy skin. °¨ A rapid heartbeat. °¨ Nausea. °  °This information is not intended to replace advice given to you by your health care provider. Make sure you discuss any questions you have with your health care provider. °  °Document Released: 12/13/2014 Document Reviewed: 12/13/2014 °Elsevier Interactive Patient Education ©2016 [employer] ° °

## 2016-07-04 LAB — TYPE AND SCREEN
ABO/RH(D): O POS
ANTIBODY SCREEN: NEGATIVE
UNIT DIVISION: 0
Unit division: 0

## 2016-07-12 ENCOUNTER — Other Ambulatory Visit (HOSPITAL_BASED_OUTPATIENT_CLINIC_OR_DEPARTMENT_OTHER): Payer: Medicare Other

## 2016-07-12 ENCOUNTER — Ambulatory Visit: Payer: Medicare Other

## 2016-07-12 ENCOUNTER — Telehealth: Payer: Self-pay | Admitting: *Deleted

## 2016-07-12 DIAGNOSIS — D4620 Refractory anemia with excess of blasts, unspecified: Secondary | ICD-10-CM

## 2016-07-12 DIAGNOSIS — D61818 Other pancytopenia: Secondary | ICD-10-CM

## 2016-07-12 DIAGNOSIS — D471 Chronic myeloproliferative disease: Secondary | ICD-10-CM | POA: Diagnosis present

## 2016-07-12 LAB — CBC WITH DIFFERENTIAL/PLATELET
BASO%: 1.1 % (ref 0.0–2.0)
Basophils Absolute: 0.1 10*3/uL (ref 0.0–0.1)
EOS ABS: 0 10*3/uL (ref 0.0–0.5)
EOS%: 0 % (ref 0.0–7.0)
HCT: 26.6 % — ABNORMAL LOW (ref 38.4–49.9)
HEMOGLOBIN: 8.7 g/dL — AB (ref 13.0–17.1)
LYMPH#: 1.3 10*3/uL (ref 0.9–3.3)
LYMPH%: 12.4 % — ABNORMAL LOW (ref 14.0–49.0)
MCH: 27.3 pg (ref 27.2–33.4)
MCHC: 32.8 g/dL (ref 32.0–36.0)
MCV: 83.3 fL (ref 79.3–98.0)
MONO#: 2.7 10*3/uL — ABNORMAL HIGH (ref 0.1–0.9)
MONO%: 25.5 % — AB (ref 0.0–14.0)
NEUT%: 61 % (ref 39.0–75.0)
NEUTROS ABS: 6.4 10*3/uL (ref 1.5–6.5)
Platelets: 28 10*3/uL — ABNORMAL LOW (ref 140–400)
RBC: 3.19 10*6/uL — ABNORMAL LOW (ref 4.20–5.82)
RDW: 17.6 % — AB (ref 11.0–14.6)
WBC: 10.4 10*3/uL — AB (ref 4.0–10.3)

## 2016-07-12 LAB — TECHNOLOGIST REVIEW

## 2016-07-12 NOTE — Telephone Encounter (Signed)
Per POF I have scheduled appts for Friday. The chemo RN will give new appts.  JMW

## 2016-07-12 NOTE — Progress Notes (Signed)
Today's Hemoglobin 8.7 and platelets 28. Per parameters listed in Dr. Calton Dach note on 05/10/16 pt does not need transfusion today. Pt states he has some fatigue but feels "great" over all. He states he is concerned about waiting 10 days before being checked again. Spoke with Dr. Alvy Bimler, lab and possible blood transfusion scheduled for Friday 07/16/16 per Dr. Alvy Bimler. Pt aware and verblaizes understanding and agrees with plan. Appointments printed and given to pt.

## 2016-07-16 ENCOUNTER — Telehealth: Payer: Self-pay | Admitting: Hematology and Oncology

## 2016-07-16 ENCOUNTER — Other Ambulatory Visit (HOSPITAL_BASED_OUTPATIENT_CLINIC_OR_DEPARTMENT_OTHER): Payer: Medicare Other

## 2016-07-16 ENCOUNTER — Other Ambulatory Visit: Payer: Self-pay | Admitting: Hematology and Oncology

## 2016-07-16 ENCOUNTER — Ambulatory Visit: Payer: Medicare Other

## 2016-07-16 ENCOUNTER — Ambulatory Visit (HOSPITAL_BASED_OUTPATIENT_CLINIC_OR_DEPARTMENT_OTHER): Payer: Medicare Other | Admitting: Hematology and Oncology

## 2016-07-16 ENCOUNTER — Encounter: Payer: Self-pay | Admitting: Hematology and Oncology

## 2016-07-16 DIAGNOSIS — D61818 Other pancytopenia: Secondary | ICD-10-CM

## 2016-07-16 DIAGNOSIS — L089 Local infection of the skin and subcutaneous tissue, unspecified: Secondary | ICD-10-CM | POA: Diagnosis not present

## 2016-07-16 DIAGNOSIS — D4620 Refractory anemia with excess of blasts, unspecified: Secondary | ICD-10-CM

## 2016-07-16 DIAGNOSIS — B9689 Other specified bacterial agents as the cause of diseases classified elsewhere: Secondary | ICD-10-CM

## 2016-07-16 DIAGNOSIS — A499 Bacterial infection, unspecified: Secondary | ICD-10-CM | POA: Diagnosis not present

## 2016-07-16 LAB — CBC WITH DIFFERENTIAL/PLATELET
BASO%: 0.2 % (ref 0.0–2.0)
Basophils Absolute: 0 10*3/uL (ref 0.0–0.1)
EOS ABS: 0 10*3/uL (ref 0.0–0.5)
EOS%: 0 % (ref 0.0–7.0)
HEMATOCRIT: 26.1 % — AB (ref 38.4–49.9)
HGB: 8.4 g/dL — ABNORMAL LOW (ref 13.0–17.1)
LYMPH#: 1.2 10*3/uL (ref 0.9–3.3)
LYMPH%: 10.5 % — AB (ref 14.0–49.0)
MCH: 26.6 pg — ABNORMAL LOW (ref 27.2–33.4)
MCHC: 32.2 g/dL (ref 32.0–36.0)
MCV: 82.4 fL (ref 79.3–98.0)
MONO#: 3.2 10*3/uL — AB (ref 0.1–0.9)
MONO%: 27.4 % — ABNORMAL HIGH (ref 0.0–14.0)
NEUT%: 61.9 % (ref 39.0–75.0)
NEUTROS ABS: 7.1 10*3/uL — AB (ref 1.5–6.5)
PLATELETS: 31 10*3/uL — AB (ref 140–400)
RBC: 3.17 10*6/uL — AB (ref 4.20–5.82)
RDW: 17.2 % — ABNORMAL HIGH (ref 11.0–14.6)
WBC: 11.5 10*3/uL — AB (ref 4.0–10.3)

## 2016-07-16 LAB — TECHNOLOGIST REVIEW

## 2016-07-16 NOTE — Assessment & Plan Note (Signed)
The patient has deep seated infection on his back. I would not recommend incision and drainage for now. He was prescribed Bactrim antibiotics 4 days ago and I recommend he continues his same. I will reevaluate next week and it is not improved, will refer him for incision and drainage with general surgery

## 2016-07-16 NOTE — Assessment & Plan Note (Addendum)
The patient is not a candidate for clinical trial. We discussed treatment options which are limited. The patient wants to preserve quality of life. At the time of discussion with the patient, he is in agreement to just receive palliative supportive care/transfusion support. In the meantime, we will continue blood transfusion support every 10 days to keep hemoglobin above 8 g. He will get 2 units of blood whenever hemoglobin is less than 8 g. He does not need platelet transfusion as he is asymptomatic from bleeding. We will only give him 1 unit of platelets if platelet count is less than 10,000 or if he has new onset of bleeding. His blood counts are satisfactory. He does not need any transfusions today

## 2016-07-16 NOTE — Telephone Encounter (Signed)
add pt per Dr Alvy Bimler seeing in infusion

## 2016-07-16 NOTE — Progress Notes (Signed)
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 He is seen in the infusion room. He shows up today for his transfusion appointment. However, he explained that he had a new cyst on his back. His primary care doctor attempted to drain it but failed. She prescribed Bactrim He denies fever or chills. The cyst is causing some discomfort but not severe pain.  REVIEW OF SYSTEMS:   Constitutional: Denies fevers, chills or abnormal weight loss Eyes: Denies blurriness of vision Ears, nose, mouth, throat, and face: Denies mucositis or sore throat Respiratory: Denies cough, dyspnea or wheezes Cardiovascular: Denies palpitation, chest discomfort or lower extremity swelling Gastrointestinal:  Denies nausea, heartburn or change in bowel habits Lymphatics: Denies new lymphadenopathy or easy bruising Neurological:Denies numbness, tingling or new weaknesses Behavioral/Psych: Mood is stable, no new changes  All other systems were reviewed with the patient and are negative.  I have reviewed the past medical history, past surgical history,  social history and family history with the patient and they are unchanged from previous note.  ALLERGIES:  has No Known Allergies.  MEDICATIONS:  Current Outpatient Prescriptions  Medication Sig Dispense Refill  . acetaminophen (TYLENOL) 500 MG tablet Take 1,000 mg by mouth every 6 (six) hours as needed for pain. Reported on 06/21/2016    . allopurinol (ZYLOPRIM) 100 MG tablet Take 50 mg by mouth daily.     Marland Kitchen ALPRAZolam (XANAX) 0.5 MG tablet   0  . atorvastatin (LIPITOR) 20 MG tablet Take 1 tablet (20 mg total) by mouth daily. 90 tablet 3  . Calcium-Vitamin D (CALTRATE 600 PLUS-VIT D PO) Take 1 tablet by mouth 2 (two) times daily.     . cyclobenzaprine (FLEXERIL) 10 MG tablet Take 1 tablet (10 mg total) by mouth 3 (three) times daily as needed for muscle spasms. 90 tablet 6  . lidocaine-prilocaine (EMLA) cream Apply 1 application topically as needed. (Patient not taking: Reported on 06/21/2016) 30 g 6  . lisinopril-hydrochlorothiazide (PRINZIDE,ZESTORETIC) 10-12.5 MG tablet take 1/2 tablet by mouth once daily 45 tablet 1  . loratadine (CLARITIN) 10 MG tablet Take 10 mg by mouth daily.    . metoprolol (LOPRESSOR) 50 MG tablet take 1/2 tablet by mouth twice a day 30 tablet 11  . MULTIPLE VITAMINS-MINERALS PO Take 1 tablet by mouth 2 (two) times daily. Reported on 06/21/2016    . NITROSTAT 0.4 MG SL tablet Place 0.4 mg under the tongue every 5 (five) minutes as needed. Reported on 06/21/2016  0  . Omega-3 Fatty Acids (FISH OIL) 1000 MG CAPS Take 2,000 mg by mouth 2 (two) times daily.    . OxyCODONE HCl 7.5 MG  TABA 1 tablet every 4 hours as needed, may increase to 2 tablets every 4 hours if neecessary (Patient not taking: Reported on 06/21/2016) 90 tablet 0  . oxyCODONE-acetaminophen (PERCOCET) 7.5-325 MG tablet Take 1 tablet by mouth every 4 (four) hours as needed for severe pain. (Patient not taking: Reported on 06/21/2016) 90 tablet 0  . predniSONE (DELTASONE) 10 MG tablet Take 20 mg (2 tablets) daily  90 tablet 6  . traMADol (ULTRAM) 50 MG tablet Take 25 mg by mouth every 4 (four) hours as needed for pain.     . traZODone (DESYREL) 100 MG tablet Take 1 tablet (100 mg total) by mouth at bedtime as needed for sleep. (Patient not taking: Reported on 06/21/2016) 30 tablet 9   No current facility-administered medications for this visit.    Facility-Administered Medications Ordered in Other Visits  Medication Dose Route Frequency Provider Last Rate Last Dose  . 0.9 %  sodium chloride infusion  250 mL Intravenous Once Heath Lark, MD        PHYSICAL EXAMINATION: ECOG PERFORMANCE STATUS: 1 - Symptomatic but completely ambulatory  Vitals:   07/16/16 1008  BP: (!) 100/31  Pulse: 68  Resp: 16  Temp: 97.7 F (36.5 C)   There were no vitals filed for this visit.  GENERAL:alert, no distress and comfortable SKIN: He has a deep-seated infection on his back EYES: normal, Conjunctiva are pink and non-injected, sclera clear Musculoskeletal:no cyanosis of digits and no clubbing  NEURO: alert & oriented x 3 with fluent speech, no focal motor/sensory deficits  LABORATORY DATA:  I have reviewed the data as listed    Component Value Date/Time   NA 136 11/16/2015 2250   NA 138 05/30/2014 1138   K 3.7 11/16/2015 2250   K 3.7 05/30/2014 1138   CL 106 11/16/2015 2250   CL 102 04/09/2013 1347   CO2 20 (L) 11/16/2015 2250   CO2 24 05/30/2014 1138   GLUCOSE 120 (H) 11/16/2015 2250   GLUCOSE 147 (H) 05/30/2014 1138   GLUCOSE 113 (H) 04/09/2013 1347   BUN 16 11/16/2015 2250   BUN 26.2 (H) 05/30/2014 1138   CREATININE 0.78 11/16/2015 2250   CREATININE 0.8 05/30/2014 1138   CALCIUM 8.6 (L) 11/16/2015 2250   CALCIUM 9.8 05/30/2014 1138   PROT 6.9 11/16/2015 2250   PROT 7.4 11/22/2013 0847   ALBUMIN 2.7 (L) 11/16/2015 2250   ALBUMIN 4.1 11/22/2013 0847   AST 26 11/16/2015 2250   AST 15 11/22/2013 0847   ALT 19 11/16/2015 2250   ALT 15 11/22/2013 0847   ALKPHOS 93 11/16/2015 2250   ALKPHOS 48  11/22/2013 0847   BILITOT 1.1 11/16/2015 2250   BILITOT 1.15 11/22/2013 0847   GFRNONAA >60 11/16/2015 2250   GFRAA >60 11/16/2015 2250    No results found for: SPEP, UPEP  Lab Results  Component Value Date   WBC 11.5 (H) 07/16/2016   NEUTROABS 7.1 (H) 07/16/2016   HGB 8.4 (L) 07/16/2016   HCT 26.1 (L) 07/16/2016   MCV 82.4 07/16/2016   PLT 31 (L) 07/16/2016      Chemistry      Component Value Date/Time   NA 136 11/16/2015 2250   NA 138 05/30/2014 1138   K 3.7 11/16/2015 2250   K 3.7 05/30/2014 1138   CL 106 11/16/2015 2250   CL 102 04/09/2013 1347   CO2 20 (L) 11/16/2015 2250   CO2 24 05/30/2014 1138   BUN 16 11/16/2015 2250   BUN  26.2 (H) 05/30/2014 1138   CREATININE 0.78 11/16/2015 2250   CREATININE 0.8 05/30/2014 1138      Component Value Date/Time   CALCIUM 8.6 (L) 11/16/2015 2250   CALCIUM 9.8 05/30/2014 1138   ALKPHOS 93 11/16/2015 2250   ALKPHOS 48 11/22/2013 0847   AST 26 11/16/2015 2250   AST 15 11/22/2013 0847   ALT 19 11/16/2015 2250   ALT 15 11/22/2013 0847   BILITOT 1.1 11/16/2015 2250   BILITOT 1.15 11/22/2013 0847         ASSESSMENT & PLAN:  MDS (myelodysplastic syndrome), low grade The patient is not a candidate for clinical trial. We discussed treatment options which are limited. The patient wants to preserve quality of life. At the time of discussion with the patient, he is in agreement to just receive palliative supportive care/transfusion support. In the meantime, we will continue blood transfusion support every 10 days to keep hemoglobin above 8 g. He will get 2 units of blood whenever hemoglobin is less than 8 g. He does not need platelet transfusion as he is asymptomatic from bleeding. We will only give him 1 unit of platelets if platelet count is less than 10,000 or if he has new onset of bleeding. His blood counts are satisfactory. He does not need any transfusions today  Bacterial skin infection The patient has deep seated  infection on his back. I would not recommend incision and drainage for now. He was prescribed Bactrim antibiotics 4 days ago and I recommend he continues his same. I will reevaluate next week and it is not improved, will refer him for incision and drainage with general surgery   No orders of the defined types were placed in this encounter.  All questions were answered. The patient knows to call the clinic with any problems, questions or concerns. No barriers to learning was detected. I spent 15 minutes counseling the patient face to face. The total time spent in the appointment was 20 minutes and more than 50% was on counseling and review of test results     [facility], Elwyn Lowden, MD 07/16/2016 10:32 AM

## 2016-07-19 ENCOUNTER — Telehealth: Payer: Self-pay | Admitting: *Deleted

## 2016-07-19 NOTE — Telephone Encounter (Signed)
Pt wants to see Dr Alvy Bimler at [facility] Tuesday

## 2016-07-19 NOTE — Telephone Encounter (Signed)
Pt called requested a call back from nurse.  Spoke with pt and was informed that pt has had a boil on his back for over 10 years.   Pt had it drained by Dr. Justin Mend last week.  Stated Dr. Alvy Bimler had seen the boil at his last visit on 07/16/16. Stated now it is still draining with Pussy drainage and has  Foul odor.   Pt wished to be seen sooner by Dr. Alvy Bimler.  Stated the boil is really bothering pt now and painful. Pt's    Phone       [phone].

## 2016-07-19 NOTE — Telephone Encounter (Signed)
I am absolutely overbooked today Someone is about to add another patient on my list and I have to see 2 patients in the hospital He was prescribe antibiotics last week and he has pain medications to take  Options: 1) see [place] today if available 2) see PCP 3) See me tomorrow at 1045 am

## 2016-07-20 ENCOUNTER — Ambulatory Visit (HOSPITAL_BASED_OUTPATIENT_CLINIC_OR_DEPARTMENT_OTHER): Payer: Medicare Other | Admitting: Hematology and Oncology

## 2016-07-20 ENCOUNTER — Encounter: Payer: Self-pay | Admitting: Hematology and Oncology

## 2016-07-20 DIAGNOSIS — D4620 Refractory anemia with excess of blasts, unspecified: Secondary | ICD-10-CM

## 2016-07-20 DIAGNOSIS — L089 Local infection of the skin and subcutaneous tissue, unspecified: Secondary | ICD-10-CM

## 2016-07-20 DIAGNOSIS — A499 Bacterial infection, unspecified: Secondary | ICD-10-CM | POA: Diagnosis not present

## 2016-07-20 DIAGNOSIS — B9689 Other specified bacterial agents as the cause of diseases classified elsewhere: Secondary | ICD-10-CM

## 2016-07-20 NOTE — Assessment & Plan Note (Signed)
The patient is not a candidate for clinical trial. We discussed treatment options which are limited. The patient wants to preserve quality of life. At the time of discussion with the patient, he is in agreement to just receive palliative supportive care/transfusion support. In the meantime, we will continue blood transfusion support every 10 days to keep hemoglobin above 8 g. He will get 2 units of blood whenever hemoglobin is less than 8 g. He does not need platelet transfusion as he is asymptomatic from bleeding. We will only give him 1 unit of platelets if platelet count is less than 10,000 or if he has new onset of bleeding. He will continue to follow-up here regularly for blood transfusions as needed

## 2016-07-20 NOTE — Assessment & Plan Note (Signed)
The skin infection is healing well. I manually expressed a little bit more pus out of the wound but it is healing well with healthy granulation tissue without excessive bleeding. I placed clean gauze over it and recommend dry dressing changes daily. I reassured the patient and his wife that he does not need further antibiotic therapy

## 2016-07-20 NOTE — Progress Notes (Signed)
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 The patient is seen urgently today. Yesterday, he had profuse discharge through the skin infection site with smelly discharge. He completed his last dose of antibiotic yesterday. He had some mild pain but is taking pain medicine as prescribed  REVIEW OF SYSTEMS:   Constitutional: Denies fevers, chills or abnormal weight loss Eyes: Denies blurriness of vision Ears, nose, mouth, throat, and face: Denies mucositis or sore throat Respiratory: Denies cough, dyspnea or wheezes Cardiovascular: Denies palpitation, chest discomfort or lower extremity swelling Gastrointestinal:  Denies nausea, heartburn or change in bowel habits Lymphatics: Denies new lymphadenopathy or easy bruising Neurological:Denies numbness, tingling or new weaknesses Behavioral/Psych: Mood is stable, no new changes  All other systems were reviewed with the patient and are negative.  I have reviewed the past medical history, past surgical history, social history and family history with the patient and they are  unchanged from previous note.  ALLERGIES:  has No Known Allergies.  MEDICATIONS:  Current Outpatient Prescriptions  Medication Sig Dispense Refill  . acetaminophen (TYLENOL) 500 MG tablet Take 1,000 mg by mouth every 6 (six) hours as needed for pain. Reported on 06/21/2016    . allopurinol (ZYLOPRIM) 100 MG tablet Take 50 mg by mouth daily.     Marland Kitchen ALPRAZolam (XANAX) 0.5 MG tablet   0  . atorvastatin (LIPITOR) 20 MG tablet Take 1 tablet (20 mg total) by mouth daily. 90 tablet 3  . Calcium-Vitamin D (CALTRATE 600 PLUS-VIT D PO) Take 1 tablet by mouth 2 (two) times daily.     . cyclobenzaprine (FLEXERIL) 10 MG tablet Take 1 tablet (10 mg total) by mouth 3 (three) times daily as needed for muscle spasms. 90 tablet 6  . lidocaine-prilocaine (EMLA) cream Apply 1 application topically as needed. (Patient not taking: Reported on 06/21/2016) 30 g 6  . lisinopril-hydrochlorothiazide (PRINZIDE,ZESTORETIC) 10-12.5 MG tablet take 1/2 tablet by mouth once daily 45 tablet 1  . loratadine (CLARITIN) 10 MG tablet Take 10 mg by mouth daily.    . metoprolol (LOPRESSOR) 50 MG tablet take 1/2 tablet by mouth twice a day 30 tablet 11  . MULTIPLE VITAMINS-MINERALS PO Take 1 tablet by mouth 2 (two) times daily. Reported on 06/21/2016    . NITROSTAT 0.4 MG SL tablet Place 0.4 mg under the tongue every 5 (five) minutes as needed. Reported on 06/21/2016  0  . Omega-3 Fatty Acids (FISH OIL) 1000 MG CAPS Take 2,000 mg by mouth 2 (two) times daily.    . OxyCODONE HCl 7.5 MG TABA 1 tablet every 4 hours as needed, may increase to 2 tablets every 4 hours  if neecessary (Patient not taking: Reported on 06/21/2016) 90 tablet 0  . oxyCODONE-acetaminophen (PERCOCET) 7.5-325 MG tablet Take 1 tablet by mouth every 4 (four) hours as needed for severe pain. (Patient not taking: Reported on 06/21/2016) 90 tablet 0  . predniSONE (DELTASONE) 10 MG tablet Take 20 mg (2 tablets) daily 90 tablet 6  . traMADol (ULTRAM) 50 MG tablet Take 25 mg by  mouth every 4 (four) hours as needed for pain.     . traZODone (DESYREL) 100 MG tablet Take 1 tablet (100 mg total) by mouth at bedtime as needed for sleep. (Patient not taking: Reported on 06/21/2016) 30 tablet 9   No current facility-administered medications for this visit.    Facility-Administered Medications Ordered in Other Visits  Medication Dose Route Frequency Provider Last Rate Last Dose  . 0.9 %  sodium chloride infusion  250 mL Intravenous Once Heath Lark, MD        PHYSICAL EXAMINATION: ECOG PERFORMANCE STATUS: 0 - Asymptomatic  Vitals:   07/20/16 1047  BP: (!) 101/42  Pulse: 68  Resp: 18  Temp: 98.1 F (36.7 C)   Filed Weights   07/20/16 1047  Weight: 148 lb 4.8 oz (67.3 kg)    GENERAL:alert, no distress and comfortable SKIN: The skin infection is healing well. There is no surrounding cellulitis. I manually expressed a few more drop of pus out of the wound. Healthy granulation is seen. Clean dressing is placed over the wound. EYES: normal, Conjunctiva are pink and non-injected, sclera clear Musculoskeletal:no cyanosis of digits and no clubbing  NEURO: alert & oriented x 3 with fluent speech, no focal motor/sensory deficits  LABORATORY DATA:  I have reviewed the data as listed    Component Value Date/Time   NA 136 11/16/2015 2250   NA 138 05/30/2014 1138   K 3.7 11/16/2015 2250   K 3.7 05/30/2014 1138   CL 106 11/16/2015 2250   CL 102 04/09/2013 1347   CO2 20 (L) 11/16/2015 2250   CO2 24 05/30/2014 1138   GLUCOSE 120 (H) 11/16/2015 2250   GLUCOSE 147 (H) 05/30/2014 1138   GLUCOSE 113 (H) 04/09/2013 1347   BUN 16 11/16/2015 2250   BUN 26.2 (H) 05/30/2014 1138   CREATININE 0.78 11/16/2015 2250   CREATININE 0.8 05/30/2014 1138   CALCIUM 8.6 (L) 11/16/2015 2250   CALCIUM 9.8 05/30/2014 1138   PROT 6.9 11/16/2015 2250   PROT 7.4 11/22/2013 0847   ALBUMIN 2.7 (L) 11/16/2015 2250   ALBUMIN 4.1 11/22/2013 0847   AST 26 11/16/2015 2250   AST 15 11/22/2013  0847   ALT 19 11/16/2015 2250   ALT 15 11/22/2013 0847   ALKPHOS 93 11/16/2015 2250   ALKPHOS 48 11/22/2013 0847   BILITOT 1.1 11/16/2015 2250   BILITOT 1.15 11/22/2013 0847   GFRNONAA >60 11/16/2015 2250   GFRAA >60 11/16/2015 2250    No results found for: SPEP, UPEP  Lab Results  Component Value Date   WBC 11.5 (H) 07/16/2016   NEUTROABS 7.1 (H) 07/16/2016   HGB 8.4 (L) 07/16/2016   HCT 26.1 (L) 07/16/2016   MCV 82.4 07/16/2016   PLT 31 (L) 07/16/2016      Chemistry      Component Value Date/Time   NA 136 11/16/2015 2250   NA 138 05/30/2014 1138   K 3.7 11/16/2015 2250   K 3.7 05/30/2014 1138   CL 106 11/16/2015 2250   CL 102 04/09/2013 1347   CO2 20 (L) 11/16/2015  2250   CO2 24 05/30/2014 1138   BUN 16 11/16/2015 2250   BUN 26.2 (H) 05/30/2014 1138   CREATININE 0.78 11/16/2015 2250   CREATININE 0.8 05/30/2014 1138      Component Value Date/Time   CALCIUM 8.6 (L) 11/16/2015 2250   CALCIUM 9.8 05/30/2014 1138   ALKPHOS 93 11/16/2015 2250   ALKPHOS 48 11/22/2013 0847   AST 26 11/16/2015 2250   AST 15 11/22/2013 0847   ALT 19 11/16/2015 2250   ALT 15 11/22/2013 0847   BILITOT 1.1 11/16/2015 2250   BILITOT 1.15 11/22/2013 0847      ASSESSMENT & PLAN:  Bacterial skin infection The skin infection is healing well. I manually expressed a little bit more pus out of the wound but it is healing well with healthy granulation tissue without excessive bleeding. I placed clean gauze over it and recommend dry dressing changes daily. I reassured the patient and his wife that he does not need further antibiotic therapy  MDS (myelodysplastic syndrome), low grade The patient is not a candidate for clinical trial. We discussed treatment options which are limited. The patient wants to preserve quality of life. At the time of discussion with the patient, he is in agreement to just receive palliative supportive care/transfusion support. In the meantime, we will continue  blood transfusion support every 10 days to keep hemoglobin above 8 g. He will get 2 units of blood whenever hemoglobin is less than 8 g. He does not need platelet transfusion as he is asymptomatic from bleeding. We will only give him 1 unit of platelets if platelet count is less than 10,000 or if he has new onset of bleeding. He will continue to follow-up here regularly for blood transfusions as needed   No orders of the defined types were placed in this encounter.  All questions were answered. The patient knows to call the clinic with any problems, questions or concerns. No barriers to learning was detected. I spent 15 minutes counseling the patient face to face. The total time spent in the appointment was 20 minutes and more than 50% was on counseling and review of test results     [facility], [place], MD 07/20/2016 4:17 PM

## 2016-07-22 ENCOUNTER — Ambulatory Visit (HOSPITAL_COMMUNITY)
Admission: RE | Admit: 2016-07-22 | Discharge: 2016-07-22 | Disposition: A | Discharge status: Home / Self Care | Payer: Medicare Other | Source: Ambulatory Visit | Attending: Hematology and Oncology | Admitting: Hematology and Oncology

## 2016-07-22 ENCOUNTER — Ambulatory Visit (HOSPITAL_BASED_OUTPATIENT_CLINIC_OR_DEPARTMENT_OTHER): Payer: Medicare Other

## 2016-07-22 ENCOUNTER — Other Ambulatory Visit (HOSPITAL_BASED_OUTPATIENT_CLINIC_OR_DEPARTMENT_OTHER): Payer: Medicare Other

## 2016-07-22 VITALS — BP 111/41 | HR 84 | Temp 97.8°F | Resp 16

## 2016-07-22 DIAGNOSIS — D4620 Refractory anemia with excess of blasts, unspecified: Secondary | ICD-10-CM

## 2016-07-22 DIAGNOSIS — D471 Chronic myeloproliferative disease: Secondary | ICD-10-CM

## 2016-07-22 DIAGNOSIS — D61818 Other pancytopenia: Secondary | ICD-10-CM

## 2016-07-22 LAB — CBC WITH DIFFERENTIAL/PLATELET
BASO%: 0.9 % (ref 0.0–2.0)
Basophils Absolute: 0.1 10*3/uL (ref 0.0–0.1)
EOS%: 0 % (ref 0.0–7.0)
Eosinophils Absolute: 0 10*3/uL (ref 0.0–0.5)
HEMATOCRIT: 20.8 % — AB (ref 38.4–49.9)
HEMOGLOBIN: 6.7 g/dL — AB (ref 13.0–17.1)
LYMPH#: 1.6 10*3/uL (ref 0.9–3.3)
LYMPH%: 15.3 % (ref 14.0–49.0)
MCH: 26.5 pg — ABNORMAL LOW (ref 27.2–33.4)
MCHC: 32.2 g/dL (ref 32.0–36.0)
MCV: 82.2 fL (ref 79.3–98.0)
MONO#: 3 10*3/uL — ABNORMAL HIGH (ref 0.1–0.9)
MONO%: 29 % — ABNORMAL HIGH (ref 0.0–14.0)
NEUT#: 5.6 10*3/uL (ref 1.5–6.5)
NEUT%: 54.8 % (ref 39.0–75.0)
Platelets: 21 10*3/uL — ABNORMAL LOW (ref 140–400)
RBC: 2.53 10*6/uL — ABNORMAL LOW (ref 4.20–5.82)
RDW: 18.3 % — AB (ref 11.0–14.6)
WBC: 10.2 10*3/uL (ref 4.0–10.3)

## 2016-07-22 LAB — TECHNOLOGIST REVIEW

## 2016-07-22 LAB — PREPARE RBC (CROSSMATCH)

## 2016-07-22 MED ORDER — ACETAMINOPHEN 325 MG PO TABS
650.0000 mg | ORAL_TABLET | Freq: Once | ORAL | Status: AC
  Administered 2016-07-22: 650 mg via ORAL

## 2016-07-22 MED ORDER — DIPHENHYDRAMINE HCL 25 MG PO CAPS
25.0000 mg | ORAL_CAPSULE | Freq: Once | ORAL | Status: AC
  Administered 2016-07-22: 25 mg via ORAL

## 2016-07-22 MED ORDER — SODIUM CHLORIDE 0.9 % IV SOLN
250.0000 mL | Freq: Once | INTRAVENOUS | Status: AC
  Administered 2016-07-22: 250 mL via INTRAVENOUS

## 2016-07-22 MED ORDER — DIPHENHYDRAMINE HCL 25 MG PO CAPS
ORAL_CAPSULE | ORAL | Status: AC
  Filled 2016-07-22: qty 1

## 2016-07-22 MED ORDER — ACETAMINOPHEN 325 MG PO TABS
ORAL_TABLET | ORAL | Status: AC
  Filled 2016-07-22: qty 2

## 2016-07-22 NOTE — Patient Instructions (Signed)
Blood Transfusion, Care After °Refer to this sheet in the next few weeks. These instructions provide you with information about caring for yourself after your procedure. Your health care provider may also give you more specific instructions. Your treatment has been planned according to current medical practices, but problems sometimes occur. Call your health care provider if you have any problems or questions after your procedure. °WHAT TO EXPECT AFTER THE PROCEDURE °After your procedure, it is common to have: °· Bruising and soreness at the IV site. °· Chills or fever. °· Headache. °HOME CARE INSTRUCTIONS °· Take medicines only as directed by your health care provider. Ask your health care provider if you can take an over-the-counter pain reliever in case you have a fever or headache a day or two after your transfusion. °· Return to your normal activities as directed by your health care provider. °SEEK MEDICAL CARE IF:  °· You develop redness or irritation at your IV site. °· You have persistent fever, chills, or headache. °· Your urine is darker than normal. °· Your urine turns pink, red, or brown.   °· The white part of your eye turns yellow (jaundice).   °· You feel weak after doing your normal activities.   °SEEK IMMEDIATE MEDICAL CARE IF:  °· You have trouble breathing. °· You have fever and chills along with: °¨ Anxiety. °¨ Chest or back pain. °¨ Flushed skin. °¨ Clammy skin. °¨ A rapid heartbeat. °¨ Nausea. °  °This information is not intended to replace advice given to you by your health care provider. Make sure you discuss any questions you have with your health care provider. °  °Document Released: 12/13/2014 Document Reviewed: 12/13/2014 °Elsevier Interactive Patient Education ©2016 [employer] ° °

## 2016-07-23 LAB — TYPE AND SCREEN
ABO/RH(D): O POS
Antibody Screen: NEGATIVE
UNIT DIVISION: 0
Unit division: 0

## 2016-07-30 ENCOUNTER — Ambulatory Visit: Payer: Medicare Other

## 2016-07-30 ENCOUNTER — Other Ambulatory Visit: Payer: Medicare Other

## 2016-08-02 ENCOUNTER — Encounter: Payer: Self-pay | Admitting: Hematology and Oncology

## 2016-08-02 ENCOUNTER — Ambulatory Visit (HOSPITAL_BASED_OUTPATIENT_CLINIC_OR_DEPARTMENT_OTHER): Payer: Medicare Other

## 2016-08-02 ENCOUNTER — Other Ambulatory Visit (HOSPITAL_BASED_OUTPATIENT_CLINIC_OR_DEPARTMENT_OTHER): Payer: Medicare Other

## 2016-08-02 ENCOUNTER — Ambulatory Visit (HOSPITAL_BASED_OUTPATIENT_CLINIC_OR_DEPARTMENT_OTHER): Payer: Medicare Other | Admitting: Hematology and Oncology

## 2016-08-02 VITALS — BP 119/43 | HR 69 | Temp 98.0°F | Resp 18 | Ht 67.0 in | Wt 150.7 lb

## 2016-08-02 DIAGNOSIS — M0579 Rheumatoid arthritis with rheumatoid factor of multiple sites without organ or systems involvement: Secondary | ICD-10-CM

## 2016-08-02 DIAGNOSIS — A499 Bacterial infection, unspecified: Secondary | ICD-10-CM

## 2016-08-02 DIAGNOSIS — D471 Chronic myeloproliferative disease: Secondary | ICD-10-CM

## 2016-08-02 DIAGNOSIS — D4620 Refractory anemia with excess of blasts, unspecified: Secondary | ICD-10-CM

## 2016-08-02 DIAGNOSIS — L089 Local infection of the skin and subcutaneous tissue, unspecified: Secondary | ICD-10-CM

## 2016-08-02 DIAGNOSIS — B9689 Other specified bacterial agents as the cause of diseases classified elsewhere: Secondary | ICD-10-CM

## 2016-08-02 DIAGNOSIS — Z7189 Other specified counseling: Secondary | ICD-10-CM

## 2016-08-02 DIAGNOSIS — D61818 Other pancytopenia: Secondary | ICD-10-CM

## 2016-08-02 LAB — CBC WITH DIFFERENTIAL/PLATELET
BASO%: 0.5 % (ref 0.0–2.0)
Basophils Absolute: 0 10*3/uL (ref 0.0–0.1)
EOS%: 0 % (ref 0.0–7.0)
Eosinophils Absolute: 0 10*3/uL (ref 0.0–0.5)
HCT: 21.6 % — ABNORMAL LOW (ref 38.4–49.9)
HGB: 7 g/dL — ABNORMAL LOW (ref 13.0–17.1)
LYMPH%: 12.3 % — AB (ref 14.0–49.0)
MCH: 27.2 pg (ref 27.2–33.4)
MCHC: 32.5 g/dL (ref 32.0–36.0)
MCV: 83.8 fL (ref 79.3–98.0)
MONO#: 2.5 10*3/uL — ABNORMAL HIGH (ref 0.1–0.9)
MONO%: 24.6 % — AB (ref 0.0–14.0)
NEUT%: 62.6 % (ref 39.0–75.0)
NEUTROS ABS: 6.2 10*3/uL (ref 1.5–6.5)
RBC: 2.58 10*6/uL — AB (ref 4.20–5.82)
RDW: 16.6 % — ABNORMAL HIGH (ref 11.0–14.6)
WBC: 10 10*3/uL (ref 4.0–10.3)
lymph#: 1.2 10*3/uL (ref 0.9–3.3)

## 2016-08-02 LAB — TECHNOLOGIST REVIEW

## 2016-08-02 LAB — PREPARE RBC (CROSSMATCH)

## 2016-08-02 MED ORDER — ACETAMINOPHEN 325 MG PO TABS
650.0000 mg | ORAL_TABLET | Freq: Once | ORAL | Status: AC
  Administered 2016-08-02: 650 mg via ORAL

## 2016-08-02 MED ORDER — ACETAMINOPHEN 325 MG PO TABS
ORAL_TABLET | ORAL | Status: AC
  Filled 2016-08-02: qty 2

## 2016-08-02 MED ORDER — OXYCODONE HCL 10 MG PO TABS: 10.0000 mg | ORAL_TABLET | ORAL | 0 refills | Status: DC | PRN

## 2016-08-02 MED ORDER — DIPHENHYDRAMINE HCL 25 MG PO CAPS
ORAL_CAPSULE | ORAL | Status: AC
  Filled 2016-08-02: qty 1

## 2016-08-02 MED ORDER — SODIUM CHLORIDE 0.9 % IV SOLN
250.0000 mL | Freq: Once | INTRAVENOUS | Status: AC
  Administered 2016-08-02: 250 mL via INTRAVENOUS

## 2016-08-02 MED ORDER — DIPHENHYDRAMINE HCL 25 MG PO CAPS
25.0000 mg | ORAL_CAPSULE | Freq: Once | ORAL | Status: AC
  Administered 2016-08-02: 25 mg via ORAL

## 2016-08-02 MED FILL — oxyCODONE HCL 10 MG TABS: 10 | 15 days supply | Qty: 90 | Fill #0

## 2016-08-02 MED FILL — predniSONE 10 MG TABS: 10 | 45 days supply | Qty: 90 | Fill #1

## 2016-08-02 NOTE — Assessment & Plan Note (Signed)
We have long discussion about palliative care and end-of-life care. The patient is aware that he is not doing well. There is no clinical trial available and I would not recommending systemic chemotherapy. After much discussion, we are in agreement for supportive transfusion only. For now, he wants supportive therapy. He agreed that his CODE STATUS is DO NOT RESUSCITATE We also discussed port placement and he declined.

## 2016-08-02 NOTE — Assessment & Plan Note (Signed)
The patient is not a candidate for clinical trial. We discussed treatment options which are limited. The patient wants to preserve quality of life. At the time of discussion with the patient, he is in agreement to just receive palliative supportive care/transfusion support. In the meantime, we will continue blood transfusion support every 10 days to keep hemoglobin above 8 g. He will get 2 units of blood whenever hemoglobin is less than 8 g. He does not need platelet transfusion as he is asymptomatic from bleeding. We will only give him 1 unit of platelets if platelet count is less than 10,000 or if he has new onset of bleeding. He will continue to follow-up here regularly for blood transfusions as needed

## 2016-08-02 NOTE — Assessment & Plan Note (Signed)
The skin infection is healing well. It is healing well with healthy granulation tissue without excessive bleeding. I placed clean gauze over it and recommend dry dressing changes daily. I reassured the patient he does not need further antibiotic therapy

## 2016-08-02 NOTE — Progress Notes (Signed)
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 He returns for follow-up. He is doing well on transfusion support. He complained of mild fatigue He bruises easily The patient denies any recent signs or symptoms of bleeding such as spontaneous epistaxis, hematuria or hematochezia. He denies further fever or chills. The skin infection on his back is healing well  REVIEW OF SYSTEMS:   Constitutional: Denies fevers, chills or abnormal weight loss Eyes: Denies blurriness of vision Ears, nose, mouth, throat, and face: Denies mucositis or sore throat Respiratory: Denies cough, dyspnea or wheezes Cardiovascular: Denies palpitation, chest discomfort or lower extremity swelling Gastrointestinal:  Denies nausea, heartburn or change in bowel habits Lymphatics: Denies new lymphadenopathy  Neurological:Denies numbness, tingling or new weaknesses Behavioral/Psych: Mood is stable, no new changes  All other systems were reviewed with the patient and are negative.  I have reviewed the past medical history, past surgical history,  social history and family history with the patient and they are unchanged from previous note.  ALLERGIES:  has No Known Allergies.  MEDICATIONS:  Current Outpatient Prescriptions  Medication Sig Dispense Refill  . acetaminophen (TYLENOL) 500 MG tablet Take 1,000 mg by mouth every 6 (six) hours as needed for pain. Reported on 06/21/2016    . allopurinol (ZYLOPRIM) 100 MG tablet Take 50 mg by mouth daily.     Marland Kitchen ALPRAZolam (XANAX) 0.5 MG tablet   0  . Calcium-Vitamin D (CALTRATE 600 PLUS-VIT D PO) Take 1 tablet by mouth 2 (two) times daily.     . cyclobenzaprine (FLEXERIL) 10 MG tablet Take 1 tablet (10 mg total) by mouth 3 (three) times daily as needed for muscle spasms. 90 tablet 6  . lisinopril-hydrochlorothiazide (PRINZIDE,ZESTORETIC) 10-12.5 MG tablet take 1/2 tablet by mouth once daily 45 tablet 1  . loratadine (CLARITIN) 10 MG tablet Take 10 mg by mouth daily.    . metoprolol (LOPRESSOR) 50 MG tablet take 1/2 tablet by mouth twice a day 30 tablet 11  . MULTIPLE VITAMINS-MINERALS PO Take 1 tablet by mouth 2 (two) times daily. Reported on 06/21/2016    . NITROSTAT 0.4 MG SL tablet Place 0.4 mg under the tongue every 5 (five) minutes as needed. Reported on 06/21/2016  0  . Omega-3 Fatty Acids (FISH OIL) 1000 MG CAPS Take 2,000 mg by mouth 2 (two) times daily.    . OxyCODONE HCl 7.5 MG TABA 1 tablet every 4 hours as needed, may increase to 2 tablets every 4 hours if neecessary 90 tablet 0  . predniSONE (DELTASONE) 10 MG tablet Take 20 mg (2 tablets) daily 90 tablet 6  . traMADol (ULTRAM) 50  MG tablet Take 25 mg by mouth every 4 (four) hours as needed for pain.     Marland Kitchen oxyCODONE 10 MG TABS Take 1 tablet (10 mg total) by mouth every 4 (four) hours as needed for severe pain. 90 tablet 0   No current facility-administered medications for this visit.    Facility-Administered Medications Ordered in Other Visits  Medication Dose Route Frequency Provider Last Rate Last Dose  . 0.9 %  sodium chloride  infusion  250 mL Intravenous Once Heath Lark, MD      . 0.9 %  sodium chloride infusion  250 mL Intravenous Once Heath Lark, MD      . acetaminophen (TYLENOL) tablet 650 mg  650 mg Oral Once Heath Lark, MD      . diphenhydrAMINE (BENADRYL) capsule 25 mg  25 mg Oral Once Heath Lark, MD        PHYSICAL EXAMINATION: ECOG PERFORMANCE STATUS: 1 - Symptomatic but completely ambulatory  Vitals:   08/02/16 0828  BP: (!) 119/43  Pulse: 69  Resp: 18  Temp: 98 F (36.7 C)   Filed Weights   08/02/16 0828  Weight: 150 lb 11.2 oz (68.4 kg)    GENERAL:alert, no distress and comfortable SKIN: The skin of his back is healing well without any evidence of cellulitis. There is healthy granulation tissue. Extensive bruises are noted EYES: normal, Conjunctiva are pink and non-injected, sclera clear OROPHARYNX:no exudate, no erythema and lips, buccal mucosa, and tongue normal  NECK: supple, thyroid normal size, non-tender, without nodularity LYMPH:  no palpable lymphadenopathy in the cervical, axillary or inguinal LUNGS: clear to auscultation and percussion with normal breathing effort HEART: regular rate & rhythm with soft systolic murmur no lower extremity edema ABDOMEN:abdomen soft, non-tender and normal bowel sounds. Massive splenomegaly Musculoskeletal:no cyanosis of digits and no clubbing  NEURO: alert & oriented x 3 with fluent speech, no focal motor/sensory deficits  LABORATORY DATA:  I have reviewed the data as listed    Component Value Date/Time   NA 136 11/16/2015 2250   NA 138 05/30/2014 1138   K 3.7 11/16/2015 2250   K 3.7 05/30/2014 1138   CL 106 11/16/2015 2250   CL 102 04/09/2013 1347   CO2 20 (L) 11/16/2015 2250   CO2 24 05/30/2014 1138   GLUCOSE 120 (H) 11/16/2015 2250   GLUCOSE 147 (H) 05/30/2014 1138   GLUCOSE 113 (H) 04/09/2013 1347   BUN 16 11/16/2015 2250   BUN 26.2 (H) 05/30/2014 1138   CREATININE 0.78 11/16/2015 2250   CREATININE 0.8 05/30/2014 1138   CALCIUM  8.6 (L) 11/16/2015 2250   CALCIUM 9.8 05/30/2014 1138   PROT 6.9 11/16/2015 2250   PROT 7.4 11/22/2013 0847   ALBUMIN 2.7 (L) 11/16/2015 2250   ALBUMIN 4.1 11/22/2013 0847   AST 26 11/16/2015 2250   AST 15 11/22/2013 0847   ALT 19 11/16/2015 2250   ALT 15 11/22/2013 0847   ALKPHOS 93 11/16/2015 2250   ALKPHOS 48 11/22/2013 0847   BILITOT 1.1 11/16/2015 2250   BILITOT 1.15 11/22/2013 0847   GFRNONAA >60 11/16/2015 2250   GFRAA >60 11/16/2015 2250    No results found for: SPEP, UPEP  Lab Results  Component Value Date   WBC 10.0 08/02/2016   NEUTROABS 6.2 08/02/2016   HGB 7.0 (L) 08/02/2016   HCT 21.6 (L) 08/02/2016   MCV 83.8 08/02/2016   PLT 18 Large platelets present (L) 08/02/2016      Chemistry  Component Value Date/Time   NA 136 11/16/2015 2250   NA 138 05/30/2014 1138   K 3.7 11/16/2015 2250   K 3.7 05/30/2014 1138   CL 106 11/16/2015 2250   CL 102 04/09/2013 1347   CO2 20 (L) 11/16/2015 2250   CO2 24 05/30/2014 1138   BUN 16 11/16/2015 2250   BUN 26.2 (H) 05/30/2014 1138   CREATININE 0.78 11/16/2015 2250   CREATININE 0.8 05/30/2014 1138      Component Value Date/Time   CALCIUM 8.6 (L) 11/16/2015 2250   CALCIUM 9.8 05/30/2014 1138   ALKPHOS 93 11/16/2015 2250   ALKPHOS 48 11/22/2013 0847   AST 26 11/16/2015 2250   AST 15 11/22/2013 0847   ALT 19 11/16/2015 2250   ALT 15 11/22/2013 0847   BILITOT 1.1 11/16/2015 2250   BILITOT 1.15 11/22/2013 0847     ASSESSMENT & PLAN:  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 We discussed treatment options which are limited. The patient wants to preserve quality of life. At the time of discussion with the patient, he is in agreement to just receive palliative supportive care/transfusion support. In the meantime, we will continue blood transfusion support every 10 days to keep hemoglobin above 8 g. He will get 2 units of blood whenever hemoglobin is less than 8 g. He does not  need platelet transfusion as he is asymptomatic from bleeding. We will only give him 1 unit of platelets if platelet count is less than 10,000 or if he has new onset of bleeding. He will continue to follow-up here regularly for blood transfusions as needed  Bacterial skin infection The skin infection is healing well. It is healing well with healthy granulation tissue without excessive bleeding. I placed clean gauze over it and recommend dry dressing changes daily. I reassured the patient he does not need further antibiotic therapy  Rheumatoid arthritis We'll continue low-dose prednisone for now    DNR (do not resuscitate) discussion We have long discussion about palliative care and end-of-life care. The patient is aware that he is not doing well. There is no clinical trial available and I would not recommending systemic chemotherapy. After much discussion, we are in agreement for supportive transfusion only. For now, he wants supportive therapy. He agreed that his CODE STATUS is DO NOT RESUSCITATE We also discussed port placement and he declined.   No orders of the defined types were placed in this encounter.  All questions were answered. The patient knows to call the clinic with any problems, questions or concerns. No barriers to learning was detected. I spent 25 minutes counseling the patient face to face. The total time spent in the appointment was 30 minutes and more than 50% was on counseling and review of test results     [facility], [place], MD 08/02/2016 9:23 AM

## 2016-08-02 NOTE — Patient Instructions (Signed)
Blood Transfusion, Care After °Refer to this sheet in the next few weeks. These instructions provide you with information about caring for yourself after your procedure. Your health care provider may also give you more specific instructions. Your treatment has been planned according to current medical practices, but problems sometimes occur. Call your health care provider if you have any problems or questions after your procedure. °WHAT TO EXPECT AFTER THE PROCEDURE °After your procedure, it is common to have: °· Bruising and soreness at the IV site. °· Chills or fever. °· Headache. °HOME CARE INSTRUCTIONS °· Take medicines only as directed by your health care provider. Ask your health care provider if you can take an over-the-counter pain reliever in case you have a fever or headache a day or two after your transfusion. °· Return to your normal activities as directed by your health care provider. °SEEK MEDICAL CARE IF:  °· You develop redness or irritation at your IV site. °· You have persistent fever, chills, or headache. °· Your urine is darker than normal. °· Your urine turns pink, red, or brown.   °· The white part of your eye turns yellow (jaundice).   °· You feel weak after doing your normal activities.   °SEEK IMMEDIATE MEDICAL CARE IF:  °· You have trouble breathing. °· You have fever and chills along with: °¨ Anxiety. °¨ Chest or back pain. °¨ Flushed skin. °¨ Clammy skin. °¨ A rapid heartbeat. °¨ Nausea. °  °This information is not intended to replace advice given to you by your health care provider. Make sure you discuss any questions you have with your health care provider. °  °Document Released: 12/13/2014 Document Reviewed: 12/13/2014 °Elsevier Interactive Patient Education ©2016 [employer] ° °

## 2016-08-02 NOTE — Assessment & Plan Note (Signed)
We'll continue low-dose prednisone for now

## 2016-08-03 ENCOUNTER — Telehealth: Payer: Self-pay | Admitting: *Deleted

## 2016-08-03 LAB — TYPE AND SCREEN
ABO/RH(D): O POS
ANTIBODY SCREEN: NEGATIVE
UNIT DIVISION: 0
Unit division: 0

## 2016-08-03 NOTE — Telephone Encounter (Signed)
He is out of sync due to recent extra appointment. He can probably skip appt this week. Keep the one on 9/5. Then delete the one after and then keep 9/11

## 2016-08-03 NOTE — Telephone Encounter (Signed)
Called, spoke with wife informed her he needs to keep appointments September 5th and September 11th.  08-05-2016 can be skipped and 08-13-2016 appointment is not needed.  She wrote this down and will share with him upon his return.

## 2016-08-03 NOTE — Telephone Encounter (Signed)
I just received blood 08-02-2016 and am supposed to have labs to possibly receive blood every ten days.  I have lab and transfusion appointments every three to four days.  She did mention [facility], I am hard of hearing and may have missed something but could they call me to clarify these appointments.  Rturn number [phone]."

## 2016-08-05 ENCOUNTER — Other Ambulatory Visit: Payer: Medicare Other

## 2016-08-06 ENCOUNTER — Ambulatory Visit (HOSPITAL_COMMUNITY)
Admission: RE | Admit: 2016-08-06 | Discharge: 2016-08-06 | Disposition: A | Discharge status: Home / Self Care | Payer: Medicare Other | Source: Ambulatory Visit | Attending: Hematology and Oncology | Admitting: Hematology and Oncology

## 2016-08-06 DIAGNOSIS — D4620 Refractory anemia with excess of blasts, unspecified: Secondary | ICD-10-CM

## 2016-08-06 DIAGNOSIS — D61818 Other pancytopenia: Secondary | ICD-10-CM

## 2016-08-10 ENCOUNTER — Other Ambulatory Visit (HOSPITAL_BASED_OUTPATIENT_CLINIC_OR_DEPARTMENT_OTHER): Payer: Medicare Other

## 2016-08-10 ENCOUNTER — Inpatient Hospital Stay (HOSPITAL_COMMUNITY): Admission: RE | Admit: 2016-08-10 | Payer: Medicare Other | Source: Ambulatory Visit

## 2016-08-10 DIAGNOSIS — D61818 Other pancytopenia: Secondary | ICD-10-CM

## 2016-08-10 DIAGNOSIS — D4620 Refractory anemia with excess of blasts, unspecified: Secondary | ICD-10-CM

## 2016-08-10 DIAGNOSIS — D471 Chronic myeloproliferative disease: Secondary | ICD-10-CM | POA: Diagnosis not present

## 2016-08-10 LAB — CBC WITH DIFFERENTIAL/PLATELET
BASO%: 1 % (ref 0.0–2.0)
Basophils Absolute: 0.2 10*3/uL — ABNORMAL HIGH (ref 0.0–0.1)
EOS ABS: 0 10*3/uL (ref 0.0–0.5)
EOS%: 0 % (ref 0.0–7.0)
HEMATOCRIT: 26.7 % — AB (ref 38.4–49.9)
HEMOGLOBIN: 8.8 g/dL — AB (ref 13.0–17.1)
LYMPH%: 14.8 % (ref 14.0–49.0)
MCH: 27.4 pg (ref 27.2–33.4)
MCHC: 33 g/dL (ref 32.0–36.0)
MCV: 83.2 fL (ref 79.3–98.0)
MONO#: 4.9 10*3/uL — ABNORMAL HIGH (ref 0.1–0.9)
MONO%: 31.8 % — AB (ref 0.0–14.0)
NEUT%: 52.4 % (ref 39.0–75.0)
NEUTROS ABS: 8.1 10*3/uL — AB (ref 1.5–6.5)
Platelets: 25 10*3/uL — ABNORMAL LOW (ref 140–400)
RBC: 3.21 10*6/uL — ABNORMAL LOW (ref 4.20–5.82)
RDW: 17.7 % — ABNORMAL HIGH (ref 11.0–14.6)
WBC: 15.5 10*3/uL — AB (ref 4.0–10.3)
lymph#: 2.3 10*3/uL (ref 0.9–3.3)
nRBC: 1 % — ABNORMAL HIGH (ref 0–0)

## 2016-08-10 LAB — TECHNOLOGIST REVIEW

## 2016-08-13 ENCOUNTER — Other Ambulatory Visit: Payer: Medicare Other

## 2016-08-16 ENCOUNTER — Other Ambulatory Visit (HOSPITAL_BASED_OUTPATIENT_CLINIC_OR_DEPARTMENT_OTHER): Payer: Medicare Other

## 2016-08-16 ENCOUNTER — Other Ambulatory Visit: Payer: Self-pay | Admitting: Hematology and Oncology

## 2016-08-16 ENCOUNTER — Ambulatory Visit (HOSPITAL_BASED_OUTPATIENT_CLINIC_OR_DEPARTMENT_OTHER): Payer: Medicare Other

## 2016-08-16 VITALS — BP 128/42 | HR 72 | Temp 98.2°F | Resp 16

## 2016-08-16 DIAGNOSIS — D61818 Other pancytopenia: Secondary | ICD-10-CM | POA: Diagnosis not present

## 2016-08-16 DIAGNOSIS — D4620 Refractory anemia with excess of blasts, unspecified: Secondary | ICD-10-CM

## 2016-08-16 DIAGNOSIS — D471 Chronic myeloproliferative disease: Secondary | ICD-10-CM

## 2016-08-16 LAB — CBC WITH DIFFERENTIAL/PLATELET
BASO%: 0.8 % (ref 0.0–2.0)
BASOS ABS: 0.1 10*3/uL (ref 0.0–0.1)
EOS ABS: 0 10*3/uL (ref 0.0–0.5)
EOS%: 0 % (ref 0.0–7.0)
HEMATOCRIT: 22.1 % — AB (ref 38.4–49.9)
HGB: 7.2 g/dL — ABNORMAL LOW (ref 13.0–17.1)
LYMPH%: 13.5 % — ABNORMAL LOW (ref 14.0–49.0)
MCH: 27.2 pg (ref 27.2–33.4)
MCHC: 32.6 g/dL (ref 32.0–36.0)
MCV: 83.4 fL (ref 79.3–98.0)
MONO#: 3.6 10*3/uL — ABNORMAL HIGH (ref 0.1–0.9)
MONO%: 27.2 % — AB (ref 0.0–14.0)
NEUT#: 7.7 10*3/uL — ABNORMAL HIGH (ref 1.5–6.5)
NEUT%: 58.5 % (ref 39.0–75.0)
NRBC: 2 % — AB (ref 0–0)
PLATELETS: 26 10*3/uL — AB (ref 140–400)
RBC: 2.65 10*6/uL — AB (ref 4.20–5.82)
RDW: 18 % — AB (ref 11.0–14.6)
WBC: 13.2 10*3/uL — ABNORMAL HIGH (ref 4.0–10.3)
lymph#: 1.8 10*3/uL (ref 0.9–3.3)

## 2016-08-16 LAB — TECHNOLOGIST REVIEW

## 2016-08-16 LAB — PREPARE RBC (CROSSMATCH)

## 2016-08-16 MED ORDER — ACETAMINOPHEN 325 MG PO TABS
650.0000 mg | ORAL_TABLET | Freq: Once | ORAL | Status: AC
Start: 2016-08-16 — End: 2016-08-16
  Administered 2016-08-16: 650 mg via ORAL

## 2016-08-16 MED ORDER — DIPHENHYDRAMINE HCL 25 MG PO CAPS
25.0000 mg | ORAL_CAPSULE | Freq: Once | ORAL | Status: AC
  Administered 2016-08-16: 25 mg via ORAL

## 2016-08-16 MED ORDER — SODIUM CHLORIDE 0.9 % IV SOLN
250.0000 mL | Freq: Once | INTRAVENOUS | Status: AC
  Administered 2016-08-16: 250 mL via INTRAVENOUS

## 2016-08-16 MED ORDER — ACETAMINOPHEN 325 MG PO TABS
ORAL_TABLET | ORAL | Status: AC
  Filled 2016-08-16: qty 2

## 2016-08-16 MED ORDER — DIPHENHYDRAMINE HCL 25 MG PO CAPS
ORAL_CAPSULE | ORAL | Status: AC
  Filled 2016-08-16: qty 1

## 2016-08-16 NOTE — Patient Instructions (Signed)
Blood Transfusion, Care After °Refer to this sheet in the next few weeks. These instructions provide you with information about caring for yourself after your procedure. Your health care provider may also give you more specific instructions. Your treatment has been planned according to current medical practices, but problems sometimes occur. Call your health care provider if you have any problems or questions after your procedure. °WHAT TO EXPECT AFTER THE PROCEDURE °After your procedure, it is common to have: °· Bruising and soreness at the IV site. °· Chills or fever. °· Headache. °HOME CARE INSTRUCTIONS °· Take medicines only as directed by your health care provider. Ask your health care provider if you can take an over-the-counter pain reliever in case you have a fever or headache a day or two after your transfusion. °· Return to your normal activities as directed by your health care provider. °SEEK MEDICAL CARE IF:  °· You develop redness or irritation at your IV site. °· You have persistent fever, chills, or headache. °· Your urine is darker than normal. °· Your urine turns pink, red, or brown.   °· The white part of your eye turns yellow (jaundice).   °· You feel weak after doing your normal activities.   °SEEK IMMEDIATE MEDICAL CARE IF:  °· You have trouble breathing. °· You have fever and chills along with: °¨ Anxiety. °¨ Chest or back pain. °¨ Flushed skin. °¨ Clammy skin. °¨ A rapid heartbeat. °¨ Nausea. °  °This information is not intended to replace advice given to you by your health care provider. Make sure you discuss any questions you have with your health care provider. °  °Document Released: 12/13/2014 Document Reviewed: 12/13/2014 °Elsevier Interactive Patient Education ©2016 [employer] ° °

## 2016-08-17 ENCOUNTER — Telehealth: Payer: Self-pay | Admitting: Hematology and Oncology

## 2016-08-17 LAB — TYPE AND SCREEN
ABO/RH(D): O POS
Antibody Screen: NEGATIVE
UNIT DIVISION: 0
UNIT DIVISION: 0

## 2016-08-17 NOTE — Telephone Encounter (Signed)
Called patient to confirm appt. L/M. Appt ltr and schd mailed per 08/02/16 los.

## 2016-08-19 ENCOUNTER — Other Ambulatory Visit: Payer: Medicare Other

## 2016-08-23 ENCOUNTER — Ambulatory Visit: Payer: Medicare Other

## 2016-08-23 ENCOUNTER — Other Ambulatory Visit (HOSPITAL_BASED_OUTPATIENT_CLINIC_OR_DEPARTMENT_OTHER): Payer: Medicare Other

## 2016-08-23 DIAGNOSIS — D4620 Refractory anemia with excess of blasts, unspecified: Secondary | ICD-10-CM

## 2016-08-23 DIAGNOSIS — D471 Chronic myeloproliferative disease: Secondary | ICD-10-CM | POA: Diagnosis not present

## 2016-08-23 DIAGNOSIS — D61818 Other pancytopenia: Secondary | ICD-10-CM

## 2016-08-23 LAB — TECHNOLOGIST REVIEW

## 2016-08-23 LAB — CBC WITH DIFFERENTIAL/PLATELET
BASO%: 0.6 % (ref 0.0–2.0)
Basophils Absolute: 0.1 10*3/uL (ref 0.0–0.1)
EOS%: 0 % (ref 0.0–7.0)
Eosinophils Absolute: 0 10*3/uL (ref 0.0–0.5)
HCT: 27.8 % — ABNORMAL LOW (ref 38.4–49.9)
HGB: 8.9 g/dL — ABNORMAL LOW (ref 13.0–17.1)
LYMPH%: 14.5 % (ref 14.0–49.0)
MCH: 26.7 pg — ABNORMAL LOW (ref 27.2–33.4)
MCHC: 32 g/dL (ref 32.0–36.0)
MCV: 83.4 fL (ref 79.3–98.0)
MONO#: 2.1 10*3/uL — ABNORMAL HIGH (ref 0.1–0.9)
MONO%: 17.8 % — AB (ref 0.0–14.0)
NEUT%: 67.1 % (ref 39.0–75.0)
NEUTROS ABS: 7.8 10*3/uL — AB (ref 1.5–6.5)
Platelets: 20 10*3/uL — ABNORMAL LOW (ref 140–400)
RBC: 3.33 10*6/uL — AB (ref 4.20–5.82)
RDW: 16.5 % — ABNORMAL HIGH (ref 11.0–14.6)
WBC: 11.7 10*3/uL — AB (ref 4.0–10.3)
lymph#: 1.7 10*3/uL (ref 0.9–3.3)
nRBC: 1 % — ABNORMAL HIGH (ref 0–0)

## 2016-08-23 NOTE — Progress Notes (Signed)
Per Dr. Calton Dach office note on 08/02/16 patient to receive transfusion for hemoglobin less than 8 or platelets less than 10. Today's labs hemoglobin 8.9 and platelets 20, no transfusion indicated. Pt reports no s/s of distress and appears stable at this time. Pt educated to call clinic with any concerns. Pt verbalizes understanding.

## 2016-08-26 ENCOUNTER — Other Ambulatory Visit: Payer: Medicare Other

## 2016-08-29 NOTE — Assessment & Plan Note (Signed)
He is not been feeling well.  He will get 2 units of blood whenever hemoglobin is less than 8 g. In terms of thrombocytopenia, he will get 1 unit of platelet whenever his blood count is less than 10,000 or have active bleeding The patient needs irradiated blood products.

## 2016-08-29 NOTE — Progress Notes (Signed)
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 He returns today to review test results and to proceed with transfusion. He denies recent relapse of skin infection. He had recent cough and sneezing, resolved with over-the-counter cough syrup. He had very trace nosebleed that results spontaneously. He continues to have intermittent back pain and joint pain, stable. He shared with me that his brother recently passed away related to COPD complication and the brother-in-law recently diagnosed with stomach cancer. His appetite is stable.  REVIEW OF SYSTEMS:   Constitutional: Denies fevers, chills or abnormal weight loss Eyes: Denies blurriness of vision Ears, nose, mouth, throat, and face: Denies mucositis or sore throat Cardiovascular: Denies palpitation, chest discomfort or lower extremity swelling Gastrointestinal:  Denies nausea, heartburn or change in bowel habits Skin: Denies abnormal skin rashes Lymphatics: Denies new lymphadenopathy Neurological:Denies numbness, tingling or new weaknesses Behavioral/Psych: Mood is  stable, no new changes  All other systems were reviewed with the patient and are negative.  I have reviewed the past medical history, past surgical history, social history and family history with the patient and they are unchanged from previous note.  ALLERGIES:  has No Known Allergies.  MEDICATIONS:  Current Outpatient Prescriptions  Medication Sig Dispense Refill  . acetaminophen (TYLENOL) 500 MG tablet Take 1,000 mg by mouth every 6 (six) hours as needed for pain. Reported on 06/21/2016    . allopurinol (ZYLOPRIM) 100 MG tablet Take 50 mg by mouth daily.     . ALPRAZolam (XANAX) 0.5 MG tablet   0  . Calcium-Vitamin D (CALTRATE 600 PLUS-VIT D PO) Take 1 tablet by mouth 2 (two) times daily.     . cyclobenzaprine (FLEXERIL) 10 MG tablet Take 1 tablet (10 mg total) by mouth 3 (three) times daily as needed for muscle spasms. 90 tablet 6  . lisinopril-hydrochlorothiazide (PRINZIDE,ZESTORETIC) 10-12.5 MG tablet take 1/2 tablet by mouth once daily 45 tablet 1  . loratadine (CLARITIN) 10 MG tablet Take 10 mg by mouth daily.    . metoprolol (LOPRESSOR) 50 MG tablet take 1/2 tablet by mouth twice a day 30 tablet 11  . MULTIPLE VITAMINS-MINERALS PO Take 1 tablet by mouth 2 (two) times daily. Reported on 06/21/2016    . NITROSTAT 0.4 MG SL tablet Place 0.4 mg under the tongue every 5 (five) minutes as needed. Reported on 06/21/2016  0  . Omega-3 Fatty Acids (FISH OIL) 1000 MG CAPS Take 2,000 mg by mouth 2 (two) times daily.    . oxyCODONE 10 MG TABS Take 1 tablet (10 mg total) by mouth every 4 (four) hours as needed for severe pain. 90 tablet 0  .   OxyCODONE HCl 7.5 MG TABA 1 tablet every 4 hours as needed, may increase to 2 tablets every 4 hours if neecessary 90 tablet 0  . predniSONE (DELTASONE) 10 MG tablet Take 20 mg (2 tablets) daily 90 tablet 6  . traMADol (ULTRAM) 50 MG tablet Take 25 mg by mouth every 4 (four) hours as needed for pain.      No current facility-administered medications for this  visit.    Facility-Administered Medications Ordered in Other Visits  Medication Dose Route Frequency Provider Last Rate Last Dose  . 0.9 %  sodium chloride infusion  250 mL Intravenous Once Heath Lark, MD        PHYSICAL EXAMINATION: ECOG PERFORMANCE STATUS: 1 - Symptomatic but completely ambulatory  Vitals:   08/30/16 0918  BP: (!) 107/43  Pulse: 68  Resp: 16  Temp: 98.2 F (36.8 C)   There were no vitals filed for this visit.  GENERAL:alert, no distress and comfortable SKIN: Noted extensive skin bruises EYES: normal, Conjunctiva are pink and non-injected, sclera clear Musculoskeletal:no cyanosis of digits and no clubbing  NEURO: alert & oriented x 3 with fluent speech, no focal motor/sensory deficits  LABORATORY DATA:  I have reviewed the data as listed    Component Value Date/Time   NA 136 11/16/2015 2250   NA 138 05/30/2014 1138   K 3.7 11/16/2015 2250   K 3.7 05/30/2014 1138   CL 106 11/16/2015 2250   CL 102 04/09/2013 1347   CO2 20 (L) 11/16/2015 2250   CO2 24 05/30/2014 1138   GLUCOSE 120 (H) 11/16/2015 2250   GLUCOSE 147 (H) 05/30/2014 1138   GLUCOSE 113 (H) 04/09/2013 1347   BUN 16 11/16/2015 2250   BUN 26.2 (H) 05/30/2014 1138   CREATININE 0.78 11/16/2015 2250   CREATININE 0.8 05/30/2014 1138   CALCIUM 8.6 (L) 11/16/2015 2250   CALCIUM 9.8 05/30/2014 1138   PROT 6.9 11/16/2015 2250   PROT 7.4 11/22/2013 0847   ALBUMIN 2.7 (L) 11/16/2015 2250   ALBUMIN 4.1 11/22/2013 0847   AST 26 11/16/2015 2250   AST 15 11/22/2013 0847   ALT 19 11/16/2015 2250   ALT 15 11/22/2013 0847   ALKPHOS 93 11/16/2015 2250   ALKPHOS 48 11/22/2013 0847   BILITOT 1.1 11/16/2015 2250   BILITOT 1.15 11/22/2013 0847   GFRNONAA >60 11/16/2015 2250   GFRAA >60 11/16/2015 2250    No results found for: SPEP, UPEP  Lab Results  Component Value Date   WBC 10.7 (H) 08/30/2016   NEUTROABS 5.7 08/30/2016   HGB 6.9 (LL) 08/30/2016   HCT 21.5 (L) 08/30/2016   MCV 83.3  08/30/2016   PLT 19 (L) 08/30/2016      Chemistry      Component Value Date/Time   NA 136 11/16/2015 2250   NA 138 05/30/2014 1138   K 3.7 11/16/2015 2250   K 3.7 05/30/2014 1138   CL 106 11/16/2015 2250   CL 102 04/09/2013 1347   CO2 20 (L) 11/16/2015 2250   CO2 24 05/30/2014 1138   BUN 16 11/16/2015 2250   BUN 26.2 (H) 05/30/2014 1138   CREATININE 0.78 11/16/2015 2250   CREATININE 0.8 05/30/2014 1138      Component Value Date/Time   CALCIUM 8.6 (L) 11/16/2015 2250   CALCIUM 9.8 05/30/2014 1138   ALKPHOS 93 11/16/2015 2250   ALKPHOS 48 11/22/2013 0847   AST 26 11/16/2015 2250   AST 15 11/22/2013 0847   ALT 19 11/16/2015 2250  ALT 15 11/22/2013 0847   BILITOT 1.1 11/16/2015 2250   BILITOT 1.15 11/22/2013 0847     ASSESSMENT & PLAN:  MDS (myelodysplastic syndrome), low grade The patient is not a candidate for clinical trial. We discussed treatment options which are limited. The patient wants to preserve quality of life. At the time of discussion with the patient, he is in agreement to just receive palliative supportive care/transfusion support. In the meantime, we will continue blood transfusion support every 10 days to keep hemoglobin above 8 g. He will get 2 units of blood whenever hemoglobin is less than 8 g. He does not need platelet transfusion as he is asymptomatic from bleeding. We will only give him 1 unit of platelets if platelet count is less than 10,000 or if he has new onset of bleeding. He will continue to follow-up here regularly for blood transfusions as needed  DNR (do not resuscitate) discussion We have long discussion about palliative care and end-of-life care. The patient is aware that he is not doing well. There is no clinical trial available and I would not recommending systemic chemotherapy. After much discussion, we are in agreement for supportive transfusion only. For now, he wants supportive therapy. He agreed that his CODE STATUS is DO NOT  RESUSCITATE We also discussed port placement and he declined.  Pancytopenia, acquired ([facility]) He is not been feeling well.  He will get 2 units of blood whenever hemoglobin is less than 8 g. In terms of thrombocytopenia, he will get 1 unit of platelet whenever his blood count is less than 10,000 or have active bleeding The patient needs irradiated blood products.    Pain in lower back He continues to have lower back pain. I recommend topical lidocaine cream and to continue on his prednisone therapy. I also recommend using heat pad if tolerated. He was prescribed pain medicine and muscle relaxant and he found that helpful. Continued the same   No orders of the defined types were placed in this encounter.  All questions were answered. The patient knows to call the clinic with any problems, questions or concerns. No barriers to learning was detected. I spent 15 minutes counseling the patient face to face. The total time spent in the appointment was 20 minutes and more than 50% was on counseling and review of test results     Ni Gorsuch, MD 08/30/2016 10:58 AM   

## 2016-08-29 NOTE — Assessment & Plan Note (Signed)
The patient is not a candidate for clinical trial. We discussed treatment options which are limited. The patient wants to preserve quality of life. At the time of discussion with the patient, he is in agreement to just receive palliative supportive care/transfusion support. In the meantime, we will continue blood transfusion support every 10 days to keep hemoglobin above 8 g. He will get 2 units of blood whenever hemoglobin is less than 8 g. He does not need platelet transfusion as he is asymptomatic from bleeding. We will only give him 1 unit of platelets if platelet count is less than 10,000 or if he has new onset of bleeding. He will continue to follow-up here regularly for blood transfusions as needed

## 2016-08-29 NOTE — Assessment & Plan Note (Signed)
We have long discussion about palliative care and end-of-life care. The patient is aware that he is not doing well. There is no clinical trial available and I would not recommending systemic chemotherapy. After much discussion, we are in agreement for supportive transfusion only. For now, he wants supportive therapy. He agreed that his CODE STATUS is DO NOT RESUSCITATE We also discussed port placement and he declined.

## 2016-08-30 ENCOUNTER — Telehealth: Payer: Self-pay | Admitting: *Deleted

## 2016-08-30 ENCOUNTER — Other Ambulatory Visit (HOSPITAL_BASED_OUTPATIENT_CLINIC_OR_DEPARTMENT_OTHER): Payer: Medicare Other

## 2016-08-30 ENCOUNTER — Ambulatory Visit (HOSPITAL_BASED_OUTPATIENT_CLINIC_OR_DEPARTMENT_OTHER): Payer: Medicare Other | Admitting: Hematology and Oncology

## 2016-08-30 ENCOUNTER — Ambulatory Visit (HOSPITAL_BASED_OUTPATIENT_CLINIC_OR_DEPARTMENT_OTHER): Payer: Medicare Other

## 2016-08-30 ENCOUNTER — Telehealth: Payer: Self-pay | Admitting: Hematology and Oncology

## 2016-08-30 ENCOUNTER — Encounter: Payer: Self-pay | Admitting: Hematology and Oncology

## 2016-08-30 DIAGNOSIS — M545 Low back pain, unspecified: Secondary | ICD-10-CM

## 2016-08-30 DIAGNOSIS — D471 Chronic myeloproliferative disease: Secondary | ICD-10-CM

## 2016-08-30 DIAGNOSIS — D61818 Other pancytopenia: Secondary | ICD-10-CM | POA: Diagnosis not present

## 2016-08-30 DIAGNOSIS — D4620 Refractory anemia with excess of blasts, unspecified: Secondary | ICD-10-CM

## 2016-08-30 DIAGNOSIS — Z7189 Other specified counseling: Secondary | ICD-10-CM

## 2016-08-30 LAB — CBC WITH DIFFERENTIAL/PLATELET
BASO%: 0.9 % (ref 0.0–2.0)
BASOS ABS: 0.1 10*3/uL (ref 0.0–0.1)
EOS ABS: 0 10*3/uL (ref 0.0–0.5)
EOS%: 0 % (ref 0.0–7.0)
HCT: 21.5 % — ABNORMAL LOW (ref 38.4–49.9)
HGB: 6.9 g/dL — CL (ref 13.0–17.1)
LYMPH%: 23 % (ref 14.0–49.0)
MCH: 26.7 pg — AB (ref 27.2–33.4)
MCHC: 32.1 g/dL (ref 32.0–36.0)
MCV: 83.3 fL (ref 79.3–98.0)
MONO#: 2.4 10*3/uL — ABNORMAL HIGH (ref 0.1–0.9)
MONO%: 22.5 % — AB (ref 0.0–14.0)
NEUT#: 5.7 10*3/uL (ref 1.5–6.5)
NEUT%: 53.6 % (ref 39.0–75.0)
Platelets: 19 10*3/uL — ABNORMAL LOW (ref 140–400)
RBC: 2.58 10*6/uL — ABNORMAL LOW (ref 4.20–5.82)
RDW: 17.4 % — ABNORMAL HIGH (ref 11.0–14.6)
WBC: 10.7 10*3/uL — ABNORMAL HIGH (ref 4.0–10.3)
lymph#: 2.5 10*3/uL (ref 0.9–3.3)

## 2016-08-30 LAB — TECHNOLOGIST REVIEW

## 2016-08-30 LAB — PREPARE RBC (CROSSMATCH)

## 2016-08-30 MED ORDER — DIPHENHYDRAMINE HCL 25 MG PO CAPS
25.0000 mg | ORAL_CAPSULE | Freq: Once | ORAL | Status: AC
  Administered 2016-08-30: 25 mg via ORAL

## 2016-08-30 MED ORDER — DIPHENHYDRAMINE HCL 25 MG PO CAPS
ORAL_CAPSULE | ORAL | Status: AC
  Filled 2016-08-30: qty 1

## 2016-08-30 MED ORDER — SODIUM CHLORIDE 0.9 % IV SOLN
250.0000 mL | Freq: Once | INTRAVENOUS | Status: AC
  Administered 2016-08-30: 250 mL via INTRAVENOUS

## 2016-08-30 MED ORDER — ACETAMINOPHEN 325 MG PO TABS
650.0000 mg | ORAL_TABLET | Freq: Once | ORAL | Status: AC
  Administered 2016-08-30: 650 mg via ORAL

## 2016-08-30 MED ORDER — ACETAMINOPHEN 325 MG PO TABS
ORAL_TABLET | ORAL | Status: AC
  Filled 2016-08-30: qty 2

## 2016-08-30 NOTE — Telephone Encounter (Signed)
Per the LOS I have scheduled appts and notified the scheduler 

## 2016-08-30 NOTE — Patient Instructions (Signed)

## 2016-08-30 NOTE — Telephone Encounter (Signed)
10/02, 10/09, 10/19, 10/30, and 11/02 Appointments canceled per 09/25 LOS. 11/06 Appointments scheduled per 09/25 LOS. Patient did not stop by scheduling to receive AVS.

## 2016-08-30 NOTE — Assessment & Plan Note (Signed)
He continues to have lower back pain. I recommend topical lidocaine cream and to continue on his prednisone therapy. I also recommend using heat pad if tolerated. He was prescribed pain medicine and muscle relaxant and he found that helpful. Continued the same

## 2016-08-30 NOTE — Telephone Encounter (Signed)
Appointments already adjusted/scheduled per 9/25 los. Gave patient avs report and appointments for October and November. Per NG cxd 10/12 and 10/23 appointments - not needed - patient aware .

## 2016-08-31 LAB — TYPE AND SCREEN
ABO/RH(D): O POS
ANTIBODY SCREEN: NEGATIVE
UNIT DIVISION: 0
UNIT DIVISION: 0

## 2016-09-02 ENCOUNTER — Other Ambulatory Visit: Payer: Medicare Other

## 2016-09-06 ENCOUNTER — Encounter: Payer: Self-pay | Admitting: Interventional Cardiology

## 2016-09-06 ENCOUNTER — Ambulatory Visit (HOSPITAL_COMMUNITY)
Admission: RE | Admit: 2016-09-06 | Discharge: 2016-09-06 | Disposition: A | Discharge status: Home / Self Care | Payer: Medicare Other | Source: Ambulatory Visit | Attending: Hematology and Oncology | Admitting: Hematology and Oncology

## 2016-09-06 ENCOUNTER — Other Ambulatory Visit: Payer: Medicare Other

## 2016-09-06 DIAGNOSIS — D4620 Refractory anemia with excess of blasts, unspecified: Secondary | ICD-10-CM | POA: Insufficient documentation

## 2016-09-06 DIAGNOSIS — D471 Chronic myeloproliferative disease: Secondary | ICD-10-CM

## 2016-09-09 ENCOUNTER — Ambulatory Visit (HOSPITAL_BASED_OUTPATIENT_CLINIC_OR_DEPARTMENT_OTHER): Payer: Medicare Other

## 2016-09-09 ENCOUNTER — Other Ambulatory Visit: Payer: Self-pay | Admitting: Hematology and Oncology

## 2016-09-09 ENCOUNTER — Other Ambulatory Visit (HOSPITAL_BASED_OUTPATIENT_CLINIC_OR_DEPARTMENT_OTHER): Payer: Medicare Other

## 2016-09-09 DIAGNOSIS — D4620 Refractory anemia with excess of blasts, unspecified: Secondary | ICD-10-CM | POA: Diagnosis present

## 2016-09-09 DIAGNOSIS — D61818 Other pancytopenia: Secondary | ICD-10-CM

## 2016-09-09 DIAGNOSIS — D471 Chronic myeloproliferative disease: Secondary | ICD-10-CM

## 2016-09-09 LAB — TECHNOLOGIST REVIEW

## 2016-09-09 LAB — CBC WITH DIFFERENTIAL/PLATELET
BASO%: 1.2 % (ref 0.0–2.0)
BASOS ABS: 0.1 10*3/uL (ref 0.0–0.1)
EOS ABS: 0 10*3/uL (ref 0.0–0.5)
EOS%: 0 % (ref 0.0–7.0)
HEMATOCRIT: 22.9 % — AB (ref 38.4–49.9)
HEMOGLOBIN: 7.4 g/dL — AB (ref 13.0–17.1)
LYMPH#: 1.2 10*3/uL (ref 0.9–3.3)
LYMPH%: 14.2 % (ref 14.0–49.0)
MCH: 26.6 pg — AB (ref 27.2–33.4)
MCHC: 32.3 g/dL (ref 32.0–36.0)
MCV: 82.3 fL (ref 79.3–98.0)
MONO#: 3 10*3/uL — AB (ref 0.1–0.9)
MONO%: 35 % — ABNORMAL HIGH (ref 0.0–14.0)
NEUT%: 49.6 % (ref 39.0–75.0)
NEUTROS ABS: 4.3 10*3/uL (ref 1.5–6.5)
PLATELETS: 17 10*3/uL — AB (ref 140–400)
RBC: 2.78 10*6/uL — ABNORMAL LOW (ref 4.20–5.82)
RDW: 16.4 % — AB (ref 11.0–14.6)
WBC: 8.7 10*3/uL (ref 4.0–10.3)

## 2016-09-09 LAB — PREPARE RBC (CROSSMATCH)

## 2016-09-09 MED ORDER — ACETAMINOPHEN 325 MG PO TABS
650.0000 mg | ORAL_TABLET | Freq: Once | ORAL | Status: AC
  Administered 2016-09-09: 650 mg via ORAL

## 2016-09-09 MED ORDER — DIPHENHYDRAMINE HCL 25 MG PO CAPS
25.0000 mg | ORAL_CAPSULE | Freq: Once | ORAL | Status: AC
  Administered 2016-09-09: 25 mg via ORAL

## 2016-09-09 MED ORDER — SODIUM CHLORIDE 0.9 % IV SOLN
250.0000 mL | Freq: Once | INTRAVENOUS | Status: AC
  Administered 2016-09-09: 250 mL via INTRAVENOUS

## 2016-09-09 MED ORDER — DIPHENHYDRAMINE HCL 25 MG PO CAPS
ORAL_CAPSULE | ORAL | Status: AC
  Filled 2016-09-09: qty 1

## 2016-09-09 MED ORDER — ACETAMINOPHEN 325 MG PO TABS
ORAL_TABLET | ORAL | Status: AC
  Filled 2016-09-09: qty 2

## 2016-09-09 NOTE — Patient Instructions (Signed)
Blood Transfusion, Care After These instructions give you information about caring for yourself after your procedure. Your doctor may also give you more specific instructions. Call your doctor if you have any problems or questions after your procedure.  HOME CARE  Take medicines only as told by your doctor. Ask your doctor if you can take an over-the-counter pain reliever if you have a fever or headache a day or two after your procedure.  Return to your normal activities as told by your doctor. GET HELP IF:   You develop redness or irritation at your IV site.  You have a fever, chills, or a headache that does not go away.  Your pee (urine) is darker than normal.  Your urine turns:  Pink.  Red.  [place].  The white part of your eye turns yellow (jaundice).  You feel weak after doing your normal activities. GET HELP RIGHT AWAY IF:   You have trouble breathing.  You have fever and chills and you also have:  Anxiety.  Chest or back pain.  Flushed or pink skin.  Clammy or sweaty skin.  A fast heartbeat.  A sick feeling in your stomach (nausea).   This information is not intended to replace advice given to you by your health care provider. Make sure you discuss any questions you have with your health care provider.   Document Released: 12/13/2014 Document Reviewed: 12/13/2014 Elsevier Interactive Patient Education 2016 [employer].     Thrombocytopenia Thrombocytopenia means there are not enough platelets in your blood. Platelets are tiny cells in your blood. When you start bleeding, platelets clump together around the cut or injury to stop the bleeding. This process is called blood clotting. Not having enough platelets can cause bleeding problems. HOME CARE  Check your skin and inside your mouth for bruises or blood as told by your doctor.  Check your spit (sputum), pee (urine), and poop (stool) for blood as told by your doctor.  Do not do activities that can  cause bumps or bruises until your doctor says it is okay.  Be careful not to cut yourself when you shave or use scissors, needles, knives, or other tools.  Be careful not to burn yourself when you iron or cook.  Ask your doctor if you can drink alcohol.  Only take medicines as told by your doctor.  Tell all your doctors and your dentist that you have this bleeding problem. GET HELP RIGHT AWAY IF:  You are bleeding anywhere on your body.  You are bleeding or have bruises without knowing why.  You have blood in your spit, pee, or poop. MAKE SURE YOU:  Understand these instructions.  Will watch your condition.  Will get help right away if you are not doing well or get worse.   This information is not intended to replace advice given to you by your health care provider. Make sure you discuss any questions you have with your health care provider.   Document Released: 11/11/2011 Document Revised: 02/14/2012 Document Reviewed: 05/26/2015 Elsevier Interactive Patient Education [employer].

## 2016-09-10 LAB — TYPE AND SCREEN
ABO/RH(D): O POS
Antibody Screen: NEGATIVE
Unit division: 0
Unit division: 0

## 2016-09-13 ENCOUNTER — Other Ambulatory Visit: Payer: Medicare Other

## 2016-09-16 ENCOUNTER — Other Ambulatory Visit: Payer: Medicare Other

## 2016-09-20 ENCOUNTER — Other Ambulatory Visit (HOSPITAL_BASED_OUTPATIENT_CLINIC_OR_DEPARTMENT_OTHER): Payer: Medicare Other

## 2016-09-20 ENCOUNTER — Ambulatory Visit (HOSPITAL_BASED_OUTPATIENT_CLINIC_OR_DEPARTMENT_OTHER): Payer: Medicare Other

## 2016-09-20 VITALS — BP 90/47 | HR 64 | Temp 98.0°F | Resp 18

## 2016-09-20 DIAGNOSIS — D61818 Other pancytopenia: Secondary | ICD-10-CM

## 2016-09-20 DIAGNOSIS — D4620 Refractory anemia with excess of blasts, unspecified: Secondary | ICD-10-CM

## 2016-09-20 LAB — CBC WITH DIFFERENTIAL/PLATELET
BASO%: 0.8 % (ref 0.0–2.0)
BASOS ABS: 0.1 10*3/uL (ref 0.0–0.1)
EOS ABS: 0 10*3/uL (ref 0.0–0.5)
EOS%: 0 % (ref 0.0–7.0)
HCT: 24 % — ABNORMAL LOW (ref 38.4–49.9)
HEMOGLOBIN: 7.8 g/dL — AB (ref 13.0–17.1)
LYMPH#: 2.3 10*3/uL (ref 0.9–3.3)
LYMPH%: 24 % (ref 14.0–49.0)
MCH: 27 pg — ABNORMAL LOW (ref 27.2–33.4)
MCHC: 32.5 g/dL (ref 32.0–36.0)
MCV: 83 fL (ref 79.3–98.0)
MONO#: 1.2 10*3/uL — ABNORMAL HIGH (ref 0.1–0.9)
MONO%: 12.5 % (ref 0.0–14.0)
NEUT#: 5.9 10*3/uL (ref 1.5–6.5)
NEUT%: 62.7 % (ref 39.0–75.0)
NRBC: 1 % — AB (ref 0–0)
PLATELETS: 20 10*3/uL — AB (ref 140–400)
RBC: 2.89 10*6/uL — ABNORMAL LOW (ref 4.20–5.82)
RDW: 18.1 % — AB (ref 11.0–14.6)
WBC: 9.4 10*3/uL (ref 4.0–10.3)

## 2016-09-20 LAB — TECHNOLOGIST REVIEW

## 2016-09-20 LAB — PREPARE RBC (CROSSMATCH)

## 2016-09-20 MED ORDER — DIPHENHYDRAMINE HCL 25 MG PO CAPS
ORAL_CAPSULE | ORAL | Status: AC
Start: 2016-09-20 — End: 2016-09-20
  Filled 2016-09-20: qty 1

## 2016-09-20 MED ORDER — DIPHENHYDRAMINE HCL 25 MG PO CAPS
25.0000 mg | ORAL_CAPSULE | Freq: Once | ORAL | Status: AC
  Administered 2016-09-20: 25 mg via ORAL

## 2016-09-20 MED ORDER — HEPARIN SOD (PORK) LOCK FLUSH 100 UNIT/ML IV SOLN
250.0000 [IU] | INTRAVENOUS | Status: DC | PRN
  Filled 2016-09-20: qty 5

## 2016-09-20 MED ORDER — SODIUM CHLORIDE 0.9% FLUSH
10.0000 mL | INTRAVENOUS | Status: DC | PRN
  Filled 2016-09-20: qty 10

## 2016-09-20 MED ORDER — SODIUM CHLORIDE 0.9% FLUSH
3.0000 mL | INTRAVENOUS | Status: DC | PRN
  Filled 2016-09-20: qty 10

## 2016-09-20 MED ORDER — HEPARIN SOD (PORK) LOCK FLUSH 100 UNIT/ML IV SOLN
500.0000 [IU] | Freq: Every day | INTRAVENOUS | Status: DC | PRN
  Filled 2016-09-20: qty 5

## 2016-09-20 MED ORDER — ACETAMINOPHEN 325 MG PO TABS
ORAL_TABLET | ORAL | Status: AC
Start: 2016-09-20 — End: 2016-09-20
  Filled 2016-09-20: qty 2

## 2016-09-20 MED ORDER — SODIUM CHLORIDE 0.9 % IV SOLN
250.0000 mL | Freq: Once | INTRAVENOUS | Status: AC
  Administered 2016-09-20: 250 mL via INTRAVENOUS

## 2016-09-20 MED ORDER — ACETAMINOPHEN 325 MG PO TABS
650.0000 mg | ORAL_TABLET | Freq: Once | ORAL | Status: AC
  Administered 2016-09-20: 650 mg via ORAL

## 2016-09-20 NOTE — Patient Instructions (Addendum)
Blood Transfusion, Care After  These instructions give you information about caring for yourself after your procedure. Your doctor may also give you more specific instructions. Call your doctor if you have any problems or questions after your procedure.   HOME CARE   Take medicines only as told by your doctor. Ask your doctor if you can take an over-the-counter pain reliever if you have a fever or headache a day or two after your procedure.   Return to your normal activities as told by your doctor.  GET HELP IF:    You develop redness or irritation at your IV site.   You have a fever, chills, or a headache that does not go away.   Your pee (urine) is darker than normal.   Your urine turns:    Pink.    Red.    Brown.   The white part of your eye turns yellow (jaundice).   You feel weak after doing your normal activities.  GET HELP RIGHT AWAY IF:    You have trouble breathing.   You have fever and chills and you also have:    Anxiety.    Chest or back pain.    Flushed or pink skin.    Clammy or sweaty skin.    A fast heartbeat.    A sick feeling in your stomach (nausea).     This information is not intended to replace advice given to you by your health care provider. Make sure you discuss any questions you have with your health care provider.     Document Released: 12/13/2014 Document Reviewed: 12/13/2014  Elsevier Interactive Patient Education 2016 [employer]

## 2016-09-21 LAB — TYPE AND SCREEN
ABO/RH(D): O POS
Antibody Screen: NEGATIVE
UNIT DIVISION: 0
UNIT DIVISION: 0

## 2016-09-23 ENCOUNTER — Other Ambulatory Visit: Payer: Medicare Other

## 2016-09-27 ENCOUNTER — Other Ambulatory Visit: Payer: Medicare Other

## 2016-09-30 ENCOUNTER — Other Ambulatory Visit: Payer: Self-pay | Admitting: Hematology and Oncology

## 2016-09-30 ENCOUNTER — Ambulatory Visit (HOSPITAL_BASED_OUTPATIENT_CLINIC_OR_DEPARTMENT_OTHER): Payer: Medicare Other

## 2016-09-30 ENCOUNTER — Other Ambulatory Visit (HOSPITAL_BASED_OUTPATIENT_CLINIC_OR_DEPARTMENT_OTHER): Payer: Medicare Other

## 2016-09-30 DIAGNOSIS — D4620 Refractory anemia with excess of blasts, unspecified: Secondary | ICD-10-CM | POA: Diagnosis present

## 2016-09-30 DIAGNOSIS — D61818 Other pancytopenia: Secondary | ICD-10-CM

## 2016-09-30 LAB — CBC WITH DIFFERENTIAL/PLATELET
BASO%: 0.8 % (ref 0.0–2.0)
Basophils Absolute: 0.1 10*3/uL (ref 0.0–0.1)
EOS ABS: 0 10*3/uL (ref 0.0–0.5)
EOS%: 0 % (ref 0.0–7.0)
HCT: 24.5 % — ABNORMAL LOW (ref 38.4–49.9)
HGB: 8 g/dL — ABNORMAL LOW (ref 13.0–17.1)
LYMPH%: 17.3 % (ref 14.0–49.0)
MCH: 27.2 pg (ref 27.2–33.4)
MCHC: 32.7 g/dL (ref 32.0–36.0)
MCV: 83.3 fL (ref 79.3–98.0)
MONO#: 2.6 10*3/uL — ABNORMAL HIGH (ref 0.1–0.9)
MONO%: 28.7 % — AB (ref 0.0–14.0)
NEUT%: 53.2 % (ref 39.0–75.0)
NEUTROS ABS: 4.8 10*3/uL (ref 1.5–6.5)
Platelets: 27 10*3/uL — ABNORMAL LOW (ref 140–400)
RBC: 2.94 10*6/uL — AB (ref 4.20–5.82)
RDW: 17.7 % — ABNORMAL HIGH (ref 11.0–14.6)
WBC: 9 10*3/uL (ref 4.0–10.3)
lymph#: 1.6 10*3/uL (ref 0.9–3.3)

## 2016-09-30 LAB — TECHNOLOGIST REVIEW

## 2016-09-30 LAB — PREPARE RBC (CROSSMATCH)

## 2016-09-30 MED ORDER — DIPHENHYDRAMINE HCL 25 MG PO CAPS
ORAL_CAPSULE | ORAL | Status: AC
  Filled 2016-09-30: qty 1

## 2016-09-30 MED ORDER — ACETAMINOPHEN 325 MG PO TABS
ORAL_TABLET | ORAL | Status: AC
  Filled 2016-09-30: qty 2

## 2016-09-30 MED ORDER — DIPHENHYDRAMINE HCL 25 MG PO CAPS
25.0000 mg | ORAL_CAPSULE | Freq: Once | ORAL | Status: AC
  Administered 2016-09-30: 25 mg via ORAL

## 2016-09-30 MED ORDER — ACETAMINOPHEN 325 MG PO TABS
650.0000 mg | ORAL_TABLET | Freq: Once | ORAL | Status: AC
  Administered 2016-09-30: 650 mg via ORAL

## 2016-09-30 MED ORDER — SODIUM CHLORIDE 0.9 % IV SOLN
250.0000 mL | Freq: Once | INTRAVENOUS | Status: AC
  Administered 2016-09-30: 250 mL via INTRAVENOUS

## 2016-09-30 NOTE — Patient Instructions (Signed)
Blood Transfusion, Care After  These instructions give you information about caring for yourself after your procedure. Your doctor may also give you more specific instructions. Call your doctor if you have any problems or questions after your procedure.   HOME CARE   Take medicines only as told by your doctor. Ask your doctor if you can take an over-the-counter pain reliever if you have a fever or headache a day or two after your procedure.   Return to your normal activities as told by your doctor.  GET HELP IF:    You develop redness or irritation at your IV site.   You have a fever, chills, or a headache that does not go away.   Your pee (urine) is darker than normal.   Your urine turns:    Pink.    Red.    Brown.   The white part of your eye turns yellow (jaundice).   You feel weak after doing your normal activities.  GET HELP RIGHT AWAY IF:    You have trouble breathing.   You have fever and chills and you also have:    Anxiety.    Chest or back pain.    Flushed or pink skin.    Clammy or sweaty skin.    A fast heartbeat.    A sick feeling in your stomach (nausea).     This information is not intended to replace advice given to you by your health care provider. Make sure you discuss any questions you have with your health care provider.     Document Released: 12/13/2014 Document Reviewed: 12/13/2014  Elsevier Interactive Patient Education 2016 [employer] Inc[employer]

## 2016-10-01 LAB — TYPE AND SCREEN
ABO/RH(D): O POS
ANTIBODY SCREEN: NEGATIVE
Unit division: 0
Unit division: 0

## 2016-10-04 ENCOUNTER — Other Ambulatory Visit: Payer: Medicare Other

## 2016-10-06 ENCOUNTER — Ambulatory Visit (HOSPITAL_COMMUNITY)
Admission: RE | Admit: 2016-10-06 | Discharge: 2016-10-06 | Disposition: A | Discharge status: Home / Self Care | Payer: Medicare Other | Source: Ambulatory Visit | Attending: Hematology and Oncology | Admitting: Hematology and Oncology

## 2016-10-06 DIAGNOSIS — D4620 Refractory anemia with excess of blasts, unspecified: Secondary | ICD-10-CM | POA: Insufficient documentation

## 2016-10-07 ENCOUNTER — Other Ambulatory Visit: Payer: Medicare Other

## 2016-10-11 ENCOUNTER — Other Ambulatory Visit (HOSPITAL_BASED_OUTPATIENT_CLINIC_OR_DEPARTMENT_OTHER): Payer: Medicare Other

## 2016-10-11 ENCOUNTER — Ambulatory Visit (HOSPITAL_BASED_OUTPATIENT_CLINIC_OR_DEPARTMENT_OTHER): Payer: Medicare Other

## 2016-10-11 ENCOUNTER — Telehealth: Payer: Self-pay | Admitting: Hematology and Oncology

## 2016-10-11 ENCOUNTER — Ambulatory Visit (HOSPITAL_BASED_OUTPATIENT_CLINIC_OR_DEPARTMENT_OTHER): Payer: Medicare Other | Admitting: Hematology and Oncology

## 2016-10-11 ENCOUNTER — Other Ambulatory Visit: Payer: Self-pay | Admitting: Hematology and Oncology

## 2016-10-11 ENCOUNTER — Telehealth: Payer: Self-pay | Admitting: *Deleted

## 2016-10-11 ENCOUNTER — Encounter: Payer: Self-pay | Admitting: Hematology and Oncology

## 2016-10-11 VITALS — BP 115/37 | HR 70 | Temp 98.3°F | Resp 18 | Ht 67.0 in | Wt 151.7 lb

## 2016-10-11 DIAGNOSIS — M0579 Rheumatoid arthritis with rheumatoid factor of multiple sites without organ or systems involvement: Secondary | ICD-10-CM | POA: Diagnosis not present

## 2016-10-11 DIAGNOSIS — I952 Hypotension due to drugs: Secondary | ICD-10-CM

## 2016-10-11 DIAGNOSIS — D696 Thrombocytopenia, unspecified: Secondary | ICD-10-CM | POA: Diagnosis not present

## 2016-10-11 DIAGNOSIS — M545 Low back pain, unspecified: Secondary | ICD-10-CM

## 2016-10-11 DIAGNOSIS — D4620 Refractory anemia with excess of blasts, unspecified: Secondary | ICD-10-CM

## 2016-10-11 DIAGNOSIS — G8929 Other chronic pain: Secondary | ICD-10-CM

## 2016-10-11 LAB — CBC WITH DIFFERENTIAL/PLATELET
BASO%: 1.1 % (ref 0.0–2.0)
Basophils Absolute: 0.1 10*3/uL (ref 0.0–0.1)
EOS%: 0 % (ref 0.0–7.0)
Eosinophils Absolute: 0 10*3/uL (ref 0.0–0.5)
HEMATOCRIT: 24.7 % — AB (ref 38.4–49.9)
HEMOGLOBIN: 8 g/dL — AB (ref 13.0–17.1)
LYMPH#: 1 10*3/uL (ref 0.9–3.3)
LYMPH%: 10.2 % — ABNORMAL LOW (ref 14.0–49.0)
MCH: 26.9 pg — ABNORMAL LOW (ref 27.2–33.4)
MCHC: 32.3 g/dL (ref 32.0–36.0)
MCV: 83.3 fL (ref 79.3–98.0)
MONO#: 2.3 10*3/uL — ABNORMAL HIGH (ref 0.1–0.9)
MONO%: 24 % — AB (ref 0.0–14.0)
NEUT%: 64.7 % (ref 39.0–75.0)
NEUTROS ABS: 6.2 10*3/uL (ref 1.5–6.5)
Platelets: 28 10*3/uL — ABNORMAL LOW (ref 140–400)
RBC: 2.96 10*6/uL — ABNORMAL LOW (ref 4.20–5.82)
RDW: 17.3 % — AB (ref 11.0–14.6)
WBC: 9.6 10*3/uL (ref 4.0–10.3)

## 2016-10-11 LAB — TECHNOLOGIST REVIEW

## 2016-10-11 LAB — PREPARE RBC (CROSSMATCH)

## 2016-10-11 MED ORDER — DIPHENHYDRAMINE HCL 25 MG PO CAPS
ORAL_CAPSULE | ORAL | Status: AC
  Filled 2016-10-11: qty 1

## 2016-10-11 MED ORDER — DIPHENHYDRAMINE HCL 25 MG PO CAPS
25.0000 mg | ORAL_CAPSULE | Freq: Once | ORAL | Status: AC
  Administered 2016-10-11: 25 mg via ORAL

## 2016-10-11 MED ORDER — OXYCODONE HCL 15 MG PO TABS: 15.0000 mg | ORAL_TABLET | ORAL | 0 refills | Status: DC | PRN

## 2016-10-11 MED ORDER — ACETAMINOPHEN 325 MG PO TABS
650.0000 mg | ORAL_TABLET | Freq: Once | ORAL | Status: AC
  Administered 2016-10-11: 650 mg via ORAL

## 2016-10-11 MED ORDER — ACETAMINOPHEN 325 MG PO TABS
ORAL_TABLET | ORAL | Status: AC
  Filled 2016-10-11: qty 2

## 2016-10-11 MED FILL — oxyCODONE HCL 15 MG TABS: 15 | 15 days supply | Qty: 90 | Fill #0

## 2016-10-11 NOTE — Telephone Encounter (Signed)
Per the LOS I have scheduled appts and notified the scheduler 

## 2016-10-11 NOTE — Progress Notes (Signed)
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 He returns for further follow-up. He denies recent infection. He bruises easily.The patient denies any recent signs or symptoms of bleeding such as spontaneous epistaxis, hematuria or hematochezia. He felt that 2 units of blood transfusion helps. He continues to take oxycodone as needed for back pain. His appetite is stable. He complained of occasional dizziness from low blood pressure  REVIEW OF SYSTEMS:   Constitutional: Denies fevers, chills or abnormal weight loss Eyes: Denies blurriness of vision Ears, nose, mouth, throat, and face: Denies mucositis or sore throat Respiratory: Denies cough, dyspnea or wheezes Cardiovascular: Denies palpitation, chest discomfort or lower extremity swelling Gastrointestinal:  Denies nausea, heartburn or change in bowel habits Skin: Denies abnormal skin rashes Lymphatics: Denies new lymphadenopathy  Neurological:Denies numbness, tingling or new weaknesses Behavioral/Psych: Mood is stable, no new changes  All other systems were  reviewed with the patient and are negative.  I have reviewed the past medical history, past surgical history, social history and family history with the patient and they are unchanged from previous note.  ALLERGIES:  has No Known Allergies.  MEDICATIONS:  Current Outpatient Prescriptions  Medication Sig Dispense Refill  . acetaminophen (TYLENOL) 500 MG tablet Take 1,000 mg by mouth every 6 (six) hours as needed for pain. Reported on 06/21/2016    . allopurinol (ZYLOPRIM) 100 MG tablet Take 50 mg by mouth daily.     Marland Kitchen ALPRAZolam (XANAX) 0.5 MG tablet   0  . Calcium-Vitamin D (CALTRATE 600 PLUS-VIT D PO) Take 1 tablet by mouth 2 (two) times daily.     . cyclobenzaprine (FLEXERIL) 10 MG tablet Take 1 tablet (10 mg total) by mouth 3 (three) times daily as needed for muscle spasms. 90 tablet 6  . lisinopril-hydrochlorothiazide (PRINZIDE,ZESTORETIC) 10-12.5 MG tablet take 1/2 tablet by mouth once daily (Patient not taking: Reported on 10/11/2016) 45 tablet 1  . loratadine (CLARITIN) 10 MG tablet Take 10 mg by mouth daily.    . metoprolol (LOPRESSOR) 50 MG tablet take 1/2 tablet by mouth twice a day 30 tablet 11  . MULTIPLE VITAMINS-MINERALS PO Take 1 tablet by mouth 2 (two) times daily. Reported on 06/21/2016    . NITROSTAT 0.4 MG SL tablet Place 0.4 mg under the tongue every 5 (five) minutes as needed. Reported on 06/21/2016  0  . Omega-3 Fatty Acids (FISH OIL) 1000 MG CAPS Take 2,000 mg by mouth 2 (two) times daily.    Marland Kitchen oxyCODONE (ROXICODONE) 15 MG immediate release tablet Take 1 tablet (15 mg total) by mouth every 4 (four) hours as needed for severe pain. 90 tablet  0  . predniSONE (DELTASONE) 10 MG tablet Take 20 mg (2 tablets) daily 90 tablet 6  . traMADol (ULTRAM) 50 MG tablet Take 25 mg by mouth every 4 (four) hours as needed for pain.      No current facility-administered medications for this visit.    Facility-Administered Medications Ordered in Other Visits  Medication Dose Route Frequency  Provider Last Rate Last Dose  . 0.9 %  sodium chloride infusion  250 mL Intravenous Once Heath Lark, MD        PHYSICAL EXAMINATION: ECOG PERFORMANCE STATUS: 2 - Symptomatic, <50% confined to bed  Vitals:   10/11/16 0910  BP: (!) 115/37  Pulse: 70  Resp: 18  Temp: 98.3 F (36.8 C)   Filed Weights   10/11/16 0910  Weight: 151 lb 11.2 oz (68.8 kg)    GENERAL:alert, no distress and comfortable SKIN: Noted extensive skin bruises EYES: normal, Conjunctiva are pink and non-injected, sclera clear OROPHARYNX:no exudate, no erythema and lips, buccal mucosa, and tongue normal  NECK: supple, thyroid normal size, non-tender, without nodularity LYMPH:  no palpable lymphadenopathy in the cervical, axillary or inguinal LUNGS: clear to auscultation and percussion with normal breathing effort HEART: regular rate & rhythm and no murmurs and no lower extremity edema ABDOMEN:abdomen soft, non-tender and normal bowel sounds. Massive splenomegaly Musculoskeletal:no cyanosis of digits and no clubbing  NEURO: alert & oriented x 3 with fluent speech, no focal motor/sensory deficits  LABORATORY DATA:  I have reviewed the data as listed    Component Value Date/Time   NA 136 11/16/2015 2250   NA 138 05/30/2014 1138   K 3.7 11/16/2015 2250   K 3.7 05/30/2014 1138   CL 106 11/16/2015 2250   CL 102 04/09/2013 1347   CO2 20 (L) 11/16/2015 2250   CO2 24 05/30/2014 1138   GLUCOSE 120 (H) 11/16/2015 2250   GLUCOSE 147 (H) 05/30/2014 1138   GLUCOSE 113 (H) 04/09/2013 1347   BUN 16 11/16/2015 2250   BUN 26.2 (H) 05/30/2014 1138   CREATININE 0.78 11/16/2015 2250   CREATININE 0.8 05/30/2014 1138   CALCIUM 8.6 (L) 11/16/2015 2250   CALCIUM 9.8 05/30/2014 1138   PROT 6.9 11/16/2015 2250   PROT 7.4 11/22/2013 0847   ALBUMIN 2.7 (L) 11/16/2015 2250   ALBUMIN 4.1 11/22/2013 0847   AST 26 11/16/2015 2250   AST 15 11/22/2013 0847   ALT 19 11/16/2015 2250   ALT 15 11/22/2013 0847   ALKPHOS 93  11/16/2015 2250   ALKPHOS 48 11/22/2013 0847   BILITOT 1.1 11/16/2015 2250   BILITOT 1.15 11/22/2013 0847   GFRNONAA >60 11/16/2015 2250   GFRAA >60 11/16/2015 2250    No results found for: SPEP, UPEP  Lab Results  Component Value Date   WBC 9.6 10/11/2016   NEUTROABS 6.2 10/11/2016   HGB 8.0 (L) 10/11/2016   HCT 24.7 (L) 10/11/2016   MCV 83.3 10/11/2016   PLT 28 (L) 10/11/2016      Chemistry      Component Value Date/Time   NA 136 11/16/2015 2250   NA 138 05/30/2014 1138   K 3.7 11/16/2015 2250   K 3.7 05/30/2014 1138   CL 106 11/16/2015 2250   CL 102 04/09/2013 1347   CO2 20 (L) 11/16/2015 2250   CO2 24 05/30/2014 1138   BUN 16 11/16/2015 2250   BUN 26.2 (H) 05/30/2014 1138   CREATININE 0.78 11/16/2015 2250   CREATININE 0.8 05/30/2014 1138  Component Value Date/Time   CALCIUM 8.6 (L) 11/16/2015 2250   CALCIUM 9.8 05/30/2014 1138   ALKPHOS 93 11/16/2015 2250   ALKPHOS 48 11/22/2013 0847   AST 26 11/16/2015 2250   AST 15 11/22/2013 0847   ALT 19 11/16/2015 2250   ALT 15 11/22/2013 0847   BILITOT 1.1 11/16/2015 2250   BILITOT 1.15 11/22/2013 0847      ASSESSMENT & PLAN:  MDS (myelodysplastic syndrome), low grade The patient is not a candidate for clinical trial. We discussed treatment options which are limited. The patient wants to preserve quality of life. At the time of discussion with the patient, he is in agreement to just receive palliative supportive care/transfusion support. In the meantime, we will continue blood transfusion support every 10 days to keep hemoglobin above 8 g. He will get 2 units of blood whenever hemoglobin is less than 8 g. He does not need platelet transfusion as he is asymptomatic from bleeding. We will only give him 1 unit of platelets if platelet count is less than 10,000 or if he has new onset of bleeding. 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    Rheumatoid arthritis We'll continue low-dose prednisone for now    Pain in lower back He continues to have lower back pain. I recommend topical lidocaine cream and to continue on his prednisone therapy. I also recommend using heat pad if tolerated. He was prescribed pain medicine and muscle relaxant and he found that helpful. Continued the same I refilled his prescriptions today  Hypotension due to drugs He is symptomatic from low blood pressure. I recommend discontinuation of one of his blood pressure medications and to remain on metoprolol for now   No orders of the defined types were placed in this encounter.  All questions were answered. The patient knows to call the clinic with any problems, questions or concerns. No barriers to learning was detected. I spent 20 minutes counseling the patient face to face. The total time spent in the appointment was 25 minutes and more than 50% was on counseling and review of test results     Heath Lark, MD 10/11/2016 10:31 AM

## 2016-10-11 NOTE — Assessment & Plan Note (Signed)
The patient is not a candidate for clinical trial. We discussed treatment options which are limited. The patient wants to preserve quality of life. At the time of discussion with the patient, he is in agreement to just receive palliative supportive care/transfusion support. In the meantime, we will continue blood transfusion support every 10 days to keep hemoglobin above 8 g. He will get 2 units of blood whenever hemoglobin is less than 8 g. He does not need platelet transfusion as he is asymptomatic from bleeding. We will only give him 1 unit of platelets if platelet count is less than 10,000 or if he has new onset of bleeding. He will continue to follow-up here regularly for blood transfusions as needed

## 2016-10-11 NOTE — Assessment & Plan Note (Signed)
He is symptomatic from low blood pressure. I recommend discontinuation of one of his blood pressure medications and to remain on metoprolol for now

## 2016-10-11 NOTE — Telephone Encounter (Signed)
Message was sent to Infusion scheduler to add Infusion per 10/11/16 los. All appointments scheduled per 10/11/16 los. AVS report and appointment schedule given to patient, per 10/11/16 los.

## 2016-10-11 NOTE — Patient Instructions (Signed)
Blood Transfusion, Care After °Refer to this sheet in the next few weeks. These instructions provide you with information about caring for yourself after your procedure. Your health care provider may also give you more specific instructions. Your treatment has been planned according to current medical practices, but problems sometimes occur. Call your health care provider if you have any problems or questions after your procedure. °WHAT TO EXPECT AFTER THE PROCEDURE °After your procedure, it is common to have: °· Bruising and soreness at the IV site. °· Chills or fever. °· Headache. °HOME CARE INSTRUCTIONS °· Take medicines only as directed by your health care provider. Ask your health care provider if you can take an over-the-counter pain reliever in case you have a fever or headache a day or two after your transfusion. °· Return to your normal activities as directed by your health care provider. °SEEK MEDICAL CARE IF:  °· You develop redness or irritation at your IV site. °· You have persistent fever, chills, or headache. °· Your urine is darker than normal. °· Your urine turns pink, red, or brown.   °· The white part of your eye turns yellow (jaundice).   °· You feel weak after doing your normal activities.   °SEEK IMMEDIATE MEDICAL CARE IF:  °· You have trouble breathing. °· You have fever and chills along with: °¨ Anxiety. °¨ Chest or back pain. °¨ Flushed skin. °¨ Clammy skin. °¨ A rapid heartbeat. °¨ Nausea. °  °This information is not intended to replace advice given to you by your health care provider. Make sure you discuss any questions you have with your health care provider. °  °Document Released: 12/13/2014 Document Reviewed: 12/13/2014 °Elsevier Interactive Patient Education ©2016 [employer] ° °

## 2016-10-11 NOTE — Assessment & Plan Note (Signed)
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

## 2016-10-11 NOTE — Assessment & Plan Note (Signed)
We'll continue low-dose prednisone for now

## 2016-10-11 NOTE — Assessment & Plan Note (Signed)
He continues to have lower back pain. I recommend topical lidocaine cream and to continue on his prednisone therapy. I also recommend using heat pad if tolerated. He was prescribed pain medicine and muscle relaxant and he found that helpful. Continued the same I refilled his prescriptions today

## 2016-10-12 LAB — TYPE AND SCREEN
ABO/RH(D): O POS
Antibody Screen: NEGATIVE
UNIT DIVISION: 0
UNIT DIVISION: 0

## 2016-10-22 ENCOUNTER — Other Ambulatory Visit: Payer: Self-pay | Admitting: *Deleted

## 2016-10-22 ENCOUNTER — Other Ambulatory Visit (HOSPITAL_BASED_OUTPATIENT_CLINIC_OR_DEPARTMENT_OTHER): Payer: Medicare Other

## 2016-10-22 ENCOUNTER — Other Ambulatory Visit: Payer: Self-pay | Admitting: Hematology and Oncology

## 2016-10-22 ENCOUNTER — Ambulatory Visit (HOSPITAL_BASED_OUTPATIENT_CLINIC_OR_DEPARTMENT_OTHER): Payer: Medicare Other

## 2016-10-22 VITALS — BP 103/53 | HR 66 | Temp 98.1°F | Resp 16

## 2016-10-22 DIAGNOSIS — D4620 Refractory anemia with excess of blasts, unspecified: Secondary | ICD-10-CM | POA: Diagnosis present

## 2016-10-22 LAB — CBC WITH DIFFERENTIAL/PLATELET
BASO%: 1 % (ref 0.0–2.0)
BASOS ABS: 0.1 10*3/uL (ref 0.0–0.1)
EOS ABS: 0 10*3/uL (ref 0.0–0.5)
EOS%: 0 % (ref 0.0–7.0)
HEMATOCRIT: 22.1 % — AB (ref 38.4–49.9)
HEMOGLOBIN: 7.1 g/dL — AB (ref 13.0–17.1)
LYMPH#: 1.6 10*3/uL (ref 0.9–3.3)
LYMPH%: 16.2 % (ref 14.0–49.0)
MCH: 26.6 pg — AB (ref 27.2–33.4)
MCHC: 32.1 g/dL (ref 32.0–36.0)
MCV: 82.8 fL (ref 79.3–98.0)
MONO#: 2.4 10*3/uL — AB (ref 0.1–0.9)
MONO%: 23.9 % — ABNORMAL HIGH (ref 0.0–14.0)
NEUT#: 5.9 10*3/uL (ref 1.5–6.5)
NEUT%: 58.9 % (ref 39.0–75.0)
PLATELETS: 18 10*3/uL — AB (ref 140–400)
RBC: 2.67 10*6/uL — ABNORMAL LOW (ref 4.20–5.82)
RDW: 17.5 % — ABNORMAL HIGH (ref 11.0–14.6)
WBC: 10 10*3/uL (ref 4.0–10.3)
nRBC: 3 % — ABNORMAL HIGH (ref 0–0)

## 2016-10-22 LAB — PREPARE RBC (CROSSMATCH)

## 2016-10-22 LAB — TECHNOLOGIST REVIEW

## 2016-10-22 MED ORDER — ACETAMINOPHEN 325 MG PO TABS
ORAL_TABLET | ORAL | Status: AC
  Filled 2016-10-22: qty 2

## 2016-10-22 MED ORDER — SODIUM CHLORIDE 0.9 % IV SOLN
250.0000 mL | Freq: Once | INTRAVENOUS | Status: AC
  Administered 2016-10-22: 250 mL via INTRAVENOUS

## 2016-10-22 MED ORDER — METOPROLOL TARTRATE 50 MG PO TABS: ORAL_TABLET | ORAL | 0 refills | Status: AC

## 2016-10-22 MED ORDER — DIPHENHYDRAMINE HCL 25 MG PO CAPS
ORAL_CAPSULE | ORAL | Status: AC
  Filled 2016-10-22: qty 1

## 2016-10-22 MED ORDER — ACETAMINOPHEN 325 MG PO TABS
650.0000 mg | ORAL_TABLET | Freq: Once | ORAL | Status: AC
  Administered 2016-10-22: 650 mg via ORAL

## 2016-10-22 MED ORDER — DIPHENHYDRAMINE HCL 25 MG PO CAPS
25.0000 mg | ORAL_CAPSULE | Freq: Once | ORAL | Status: AC
  Administered 2016-10-22: 25 mg via ORAL

## 2016-10-22 NOTE — Patient Instructions (Signed)
Blood Transfusion , Adult A blood transfusion is a procedure in which you receive donated blood, including plasma, platelets, and red blood cells, through an IV tube. You may need a blood transfusion because of illness, surgery, or injury. The blood may come from a donor. You may also be able to donate blood for yourself (autologous blood donation) before a surgery if you know that you might require a blood transfusion. The blood given in a transfusion is made up of different types of cells. You may receive:  Red blood cells. These carry oxygen to the cells in the body.  White blood cells. These help you fight infections.  Platelets. These help your blood to clot.  Plasma. This is the liquid part of your blood and it helps with fluid imbalances. If you have hemophilia or another clotting disorder, you may also receive other types of blood products. Tell a health care provider about:  Any allergies you have.  All medicines you are taking, including vitamins, herbs, eye drops, creams, and over-the-counter medicines.  Any problems you or family members have had with anesthetic medicines.  Any blood disorders you have.  Any surgeries you have had.  Any medical conditions you have, including any recent fever or cold symptoms.  Whether you are pregnant or may be pregnant.  Any previous reactions you have had during a blood transfusion. What are the risks? Generally, this is a safe procedure. However, problems may occur, including:  Having an allergic reaction to something in the donated blood. Hives and itching may be symptoms of this type of reaction.  Fever. This may be a reaction to the white blood cells in the transfused blood. Nausea or chest pain may accompany a fever.  Iron overload. This can happen from having many transfusions.  Transfusion-related acute lung injury (TRALI). This is a rare reaction that causes lung damage. The cause is not known.TRALI can occur within hours  of a transfusion or several days later.  Sudden (acute) or delayed hemolytic reactions. This happens if your blood does not match the cells in your transfusion. Your body's defense system (immune system) may try to attack the new cells. This complication is rare. The symptoms include fever, chills, nausea, and low back pain or chest pain.  Infection or disease transmission. This is rare. What happens before the procedure?  You will have a blood test to determine your blood type. This is necessary to know what kind of blood your body will accept and to match it to the donor blood.  If you are going to have a planned surgery, you may be able to do an autologous blood donation. This may be done in case you need to have a transfusion.  If you have had an allergic reaction to a transfusion in the past, you may be given medicine to help prevent a reaction. This medicine may be given to you by mouth or through an IV tube.  You will have your temperature, blood pressure, and pulse monitored before the transfusion.  Follow instructions from your health care provider about eating and drinking restrictions.  Ask your health care provider about:  Changing or stopping your regular medicines. This is especially important if you are taking diabetes medicines or blood thinners.  Taking medicines such as aspirin and ibuprofen. These medicines can thin your blood. Do not take these medicines before your procedure if your health care provider instructs you not to. What happens during the procedure?  An IV tube will be   inserted into one of your veins.  The bag of donated blood will be attached to your IV tube. The blood will then enter through your vein.  Your temperature, blood pressure, and pulse will be monitored regularly during the transfusion. This monitoring is done to detect early signs of a transfusion reaction.  If you have any signs or symptoms of a reaction, your transfusion will be stopped and  you may be given medicine.  When the transfusion is complete, your IV tube will be removed.  Pressure may be applied to the IV site for a few minutes.  A bandage (dressing) will be applied. The procedure may vary among health care providers and hospitals. What happens after the procedure?  Your temperature, blood pressure, heart rate, breathing rate, and blood oxygen level will be monitored often.  Your blood may be tested to see how you are responding to the transfusion.  You may be warmed with fluids or blankets to maintain a normal body temperature. Summary  A blood transfusion is a procedure in which you receive donated blood, including plasma, platelets, and red blood cells, through an IV tube.  Your temperature, blood pressure, and pulse will be monitored before, during, and after the transfusion.  Your blood may be tested after the transfusion to see how your body has responded. This information is not intended to replace advice given to you by your health care provider. Make sure you discuss any questions you have with your health care provider. Document Released: 11/19/2000 Document Revised: 08/19/2016 Document Reviewed: 08/19/2016 Elsevier Interactive Patient Education  2017 [employer]  

## 2016-10-22 NOTE — Addendum Note (Signed)
Addended by: Juventino Slovak on: 10/22/2016 12:16 PM   Modules accepted: Orders

## 2016-10-23 LAB — TYPE AND SCREEN
ABO/RH(D): O POS
Antibody Screen: NEGATIVE
UNIT DIVISION: 0
Unit division: 0

## 2016-11-01 ENCOUNTER — Other Ambulatory Visit (HOSPITAL_BASED_OUTPATIENT_CLINIC_OR_DEPARTMENT_OTHER): Payer: Medicare Other

## 2016-11-01 ENCOUNTER — Ambulatory Visit (HOSPITAL_BASED_OUTPATIENT_CLINIC_OR_DEPARTMENT_OTHER): Payer: Medicare Other

## 2016-11-01 DIAGNOSIS — D4620 Refractory anemia with excess of blasts, unspecified: Secondary | ICD-10-CM

## 2016-11-01 LAB — TECHNOLOGIST REVIEW

## 2016-11-01 LAB — CBC WITH DIFFERENTIAL/PLATELET
BASO%: 0.8 % (ref 0.0–2.0)
BASOS ABS: 0.1 10*3/uL (ref 0.0–0.1)
EOS%: 0 % (ref 0.0–7.0)
Eosinophils Absolute: 0 10*3/uL (ref 0.0–0.5)
HEMATOCRIT: 22.4 % — AB (ref 38.4–49.9)
HGB: 7.2 g/dL — ABNORMAL LOW (ref 13.0–17.1)
LYMPH#: 1.8 10*3/uL (ref 0.9–3.3)
LYMPH%: 17.3 % (ref 14.0–49.0)
MCH: 27.1 pg — AB (ref 27.2–33.4)
MCHC: 32.2 g/dL (ref 32.0–36.0)
MCV: 84.4 fL (ref 79.3–98.0)
MONO#: 2.6 10*3/uL — ABNORMAL HIGH (ref 0.1–0.9)
MONO%: 25.1 % — ABNORMAL HIGH (ref 0.0–14.0)
NEUT#: 5.9 10*3/uL (ref 1.5–6.5)
NEUT%: 56.8 % (ref 39.0–75.0)
Platelets: 18 10*3/uL — ABNORMAL LOW (ref 140–400)
RBC: 2.66 10*6/uL — ABNORMAL LOW (ref 4.20–5.82)
RDW: 17.5 % — ABNORMAL HIGH (ref 11.0–14.6)
WBC: 10.4 10*3/uL — ABNORMAL HIGH (ref 4.0–10.3)

## 2016-11-01 LAB — PREPARE RBC (CROSSMATCH)

## 2016-11-01 MED ORDER — DIPHENHYDRAMINE HCL 25 MG PO CAPS
ORAL_CAPSULE | ORAL | Status: AC
  Filled 2016-11-01: qty 2

## 2016-11-01 MED ORDER — DIPHENHYDRAMINE HCL 25 MG PO CAPS
25.0000 mg | ORAL_CAPSULE | Freq: Once | ORAL | Status: AC
  Administered 2016-11-01: 25 mg via ORAL

## 2016-11-01 MED ORDER — ACETAMINOPHEN 325 MG PO TABS
ORAL_TABLET | ORAL | Status: AC
  Filled 2016-11-01: qty 2

## 2016-11-01 MED ORDER — SODIUM CHLORIDE 0.9 % IV SOLN
250.0000 mL | Freq: Once | INTRAVENOUS | Status: AC
  Administered 2016-11-01: 250 mL via INTRAVENOUS

## 2016-11-01 MED ORDER — ACETAMINOPHEN 325 MG PO TABS
650.0000 mg | ORAL_TABLET | Freq: Once | ORAL | Status: AC
  Administered 2016-11-01: 650 mg via ORAL

## 2016-11-01 NOTE — Patient Instructions (Signed)
Blood Transfusion, Care After °Refer to this sheet in the next few weeks. These instructions provide you with information about caring for yourself after your procedure. Your health care provider may also give you more specific instructions. Your treatment has been planned according to current medical practices, but problems sometimes occur. Call your health care provider if you have any problems or questions after your procedure. °WHAT TO EXPECT AFTER THE PROCEDURE °After your procedure, it is common to have: °· Bruising and soreness at the IV site. °· Chills or fever. °· Headache. °HOME CARE INSTRUCTIONS °· Take medicines only as directed by your health care provider. Ask your health care provider if you can take an over-the-counter pain reliever in case you have a fever or headache a day or two after your transfusion. °· Return to your normal activities as directed by your health care provider. °SEEK MEDICAL CARE IF:  °· You develop redness or irritation at your IV site. °· You have persistent fever, chills, or headache. °· Your urine is darker than normal. °· Your urine turns pink, red, or brown.   °· The white part of your eye turns yellow (jaundice).   °· You feel weak after doing your normal activities.   °SEEK IMMEDIATE MEDICAL CARE IF:  °· You have trouble breathing. °· You have fever and chills along with: °¨ Anxiety. °¨ Chest or back pain. °¨ Flushed skin. °¨ Clammy skin. °¨ A rapid heartbeat. °¨ Nausea. °  °This information is not intended to replace advice given to you by your health care provider. Make sure you discuss any questions you have with your health care provider. °  °Document Released: 12/13/2014 Document Reviewed: 12/13/2014 °Elsevier Interactive Patient Education ©2016 [employer] ° °

## 2016-11-02 ENCOUNTER — Encounter: Payer: Self-pay | Admitting: Interventional Cardiology

## 2016-11-02 LAB — TYPE AND SCREEN
ABO/RH(D): O POS
ANTIBODY SCREEN: NEGATIVE
UNIT DIVISION: 0
Unit division: 0

## 2016-11-05 ENCOUNTER — Ambulatory Visit (HOSPITAL_COMMUNITY)
Admission: RE | Admit: 2016-11-05 | Discharge: 2016-11-05 | Disposition: A | Discharge status: Home / Self Care | Payer: Medicare Other | Source: Ambulatory Visit | Attending: Hematology and Oncology | Admitting: Hematology and Oncology

## 2016-11-05 DIAGNOSIS — D4620 Refractory anemia with excess of blasts, unspecified: Secondary | ICD-10-CM | POA: Insufficient documentation

## 2016-11-05 DIAGNOSIS — I440 Atrioventricular block, first degree: Secondary | ICD-10-CM

## 2016-11-05 DIAGNOSIS — R9431 Abnormal electrocardiogram [ECG] [EKG]: Secondary | ICD-10-CM

## 2016-11-05 DIAGNOSIS — I491 Atrial premature depolarization: Secondary | ICD-10-CM

## 2016-11-07 ENCOUNTER — Other Ambulatory Visit: Payer: Self-pay | Admitting: Interventional Cardiology

## 2016-11-08 NOTE — Progress Notes (Signed)
Patient ID: BRAVE FORGEY, male   DOB: [birth year], 70 y.o.   MRN: [id number]     Cardiology Office Note   Date:  11/09/2016   ID:  Eric [name], Eric [name] 1946/04/17, MRN [id number]  PCP:  Lilian Coma, MD    No chief complaint on file. f/u CAD   Wt Readings from Last 3 Encounters:  11/09/16 150 lb 6.4 oz (68.2 kg)  10/11/16 151 lb 11.2 oz (68.8 kg)  08/02/16 150 lb 11.2 oz (68.4 kg)       History of Present Illness: Eric [name] is a 70 y.o. male  Who has MDS. He had severe CAD diagnosed in 2/16 and had CABG. He had some PAF at the time of the CABG.  He was on amiodarone but stopped this.  He no longer gets injections for the MDS. Hbg is running low. He had a transfusion of PRBCs. He has had low platelets as well. His ASA was stopped. Palliative options only per Dr. Alvy Bimler.  He was also found to have gallstones. He thought he willeventually have a cholecystectomy. He has a little pain eating fatty foods. He saw a Psychologist, sport and exercise, but sx are better after using dandelion tea.   He has not been walking as much.  He is limited by back pain, related to the cancer.  He wishes he had more stamina.  He feels very cold in colder weather, he thinks because of his blood issues.  He does not lke to use his treadmill.   His exercise tolerance has decreased.      Past Medical History:  Diagnosis Date  . Anemia   . CAD (coronary artery disease), native coronary artery 01/09/2015   3 v dz, EF 50%  . Dysrhythmia   . Family history of ischemic heart disease   . Gout   . Headache 03/07/2014  . Hypertension   . MDS (myelodysplastic syndrome), low grade ([place]) 2012   on observation; MDS FISH panel on 04/26/2013 was normal; cytogenetics were also negative.   . Muscle spasm of back 04/05/2016  . Needs flu shot 08/23/2013  . Rheumatoid arthritis(714.0)    was on MTX until MDS dx  . Shortness of breath    exertional  . Tachycardia 2013   evaluated by Dr Irish Lack @ [facility]    Past  Surgical History:  Procedure Laterality Date  . CIRCUMCISION    . CORONARY ARTERY BYPASS GRAFT N/A 01/13/2015   Procedure: CORONARY ARTERY BYPASS GRAFTING (CABG) times four, using left internal mammary artery and left greater saphenous vein.;  Surgeon: Ivin Poot, MD;  Location: [facility];  Service: Open Heart Surgery;  Laterality: N/A;  . HERNIA REPAIR    . KNEE ARTHROSCOPY Left   . LEFT HEART CATHETERIZATION WITH CORONARY ANGIOGRAM N/A 01/09/2015   Procedure: LEFT HEART CATHETERIZATION WITH CORONARY ANGIOGRAM;  Surgeon: Burnell Blanks, MD;  LAD 99/90%, D1 50%, CFX 40%, OM 70%, OM branch 80%, RCA  50%, dRCA 99%, PDA 50%, EF 50%  . TEE WITHOUT CARDIOVERSION N/A 01/13/2015   Procedure: TRANSESOPHAGEAL ECHOCARDIOGRAM (TEE);  Surgeon: Ivin Poot, MD;  Location: [facility];  Service: Open Heart Surgery;  Laterality: N/A;  . TOTAL KNEE ARTHROPLASTY Right 02/02/2013   Procedure: TOTAL KNEE ARTHROPLASTY;  Surgeon: Yvette Rack., MD;  Location: [facility];  Service: Orthopedics;  Laterality: Right;  RIGHT ARTHROPLASTY KNEE MEDIAL/LATERAL COMPARTMENTS WITH PATELLA RESURFACING   . UMBILICAL HERNIA REPAIR       Current Outpatient Prescriptions  Medication Sig Dispense Refill  . acetaminophen (TYLENOL) 500 MG tablet Take 1,000 mg by mouth every 6 (six) hours as needed for pain. Reported on 06/21/2016    . allopurinol (ZYLOPRIM) 100 MG tablet Take 50 mg by mouth daily.     Marland Kitchen ALPRAZolam (XANAX) 0.5 MG tablet   0  . Calcium-Vitamin D (CALTRATE 600 PLUS-VIT D PO) Take 1 tablet by mouth 2 (two) times daily.     . cyclobenzaprine (FLEXERIL) 10 MG tablet Take 1 tablet (10 mg total) by mouth 3 (three) times daily as needed for muscle spasms. 90 tablet 6  . lisinopril-hydrochlorothiazide (PRINZIDE,ZESTORETIC) 10-12.5 MG tablet take 1/2 tablet by mouth once daily 45 tablet 1  . loratadine (CLARITIN) 10 MG tablet Take 10 mg by mouth daily.    . metoprolol (LOPRESSOR) 50 MG tablet take 1/2 tablet by mouth twice a  day 90 tablet 0  . MULTIPLE VITAMINS-MINERALS PO Take 1 tablet by mouth 2 (two) times daily. Reported on 06/21/2016    . NITROSTAT 0.4 MG SL tablet Place 0.4 mg under the tongue every 5 (five) minutes as needed. Reported on 06/21/2016  0  . Omega-3 Fatty Acids (FISH OIL) 1000 MG CAPS Take 2,000 mg by mouth 2 (two) times daily.    Marland Kitchen oxyCODONE (ROXICODONE) 15 MG immediate release tablet Take 1 tablet (15 mg total) by mouth every 4 (four) hours as needed for severe pain. 90 tablet 0  . predniSONE (DELTASONE) 10 MG tablet Take 20 mg (2 tablets) daily 90 tablet 6  . traMADol (ULTRAM) 50 MG tablet Take 25 mg by mouth every 4 (four) hours as needed for pain.      No current facility-administered medications for this visit.    Facility-Administered Medications Ordered in Other Visits  Medication Dose Route Frequency Provider Last Rate Last Dose  . 0.9 %  sodium chloride infusion  250 mL Intravenous Once Heath Lark, MD        Allergies:   Patient has no known allergies.    Social History:  The patient  reports that he quit smoking about 46 years ago. His smoking use included Cigarettes. He has never used smokeless tobacco. He reports that he does not drink alcohol or use drugs.   Family History:  The patient's family history includes Brain cancer in his mother; Breast cancer in his sister; Diabetes in his brother; Heart attack in his brother and father; Heart disease in his brother and father.    ROS:  Please see the history of present illness.   Otherwise, review of systems are positive for joint pains, intermittent abdominal pain- known gallstones; back pain, anemia.   All other systems are reviewed and negative.    PHYSICAL EXAM: VS:  BP (!) 100/52 (BP Location: Right Arm, Patient Position: Sitting, Cuff Size: Normal)   Pulse 81   Ht 5\' 7"  (1.702 m)   Wt 150 lb 6.4 oz (68.2 kg)   BMI 23.56 kg/m  , BMI Body mass index is 23.56 kg/m. GEN: Well nourished, well developed, in no acute distress    HEENT: normal  Neck: no JVD;, bilateralcarotid bruits, or masses Cardiac: RRR; 3/6 systolic murmurs, no rubs, or gallops, 2+ Bilateral pitting edema  Respiratory:  clear to auscultation bilaterally, normal work of breathing GI: soft, nontender, nondistended, + BS; enlarged spleen MS: no deformity or atrophy  Skin: warm and dry, no rash Neuro:  Strength and sensation are intact Psych: euthymic mood, full affect  Recent Labs: 11/16/2015: ALT 19; BUN 16; Creatinine, Ser 0.78; Potassium 3.7; Sodium 136 11/01/2016: HGB 7.2; Platelets 18   Lipid Panel    Component Value Date/Time   CHOL 66 04/21/2015 0908   TRIG 104.0 04/21/2015 0908   HDL 25.00 (L) 04/21/2015 0908   CHOLHDL 3 04/21/2015 0908   VLDL 20.8 04/21/2015 0908   LDLCALC 20 04/21/2015 0908     Other studies Reviewed: Additional studies/ records that were reviewed today with results demonstrating: Normla EF by echo in 2/16..   ASSESSMENT AND PLAN:  1. CAD: No angina.  Now off of aspirin due to low platelets.  Not using NTG.  Antiplatelet therapy will be limited due to MDS issues. To help prolong life of the bypass grafts, aspirin would be beneficial.  However, at this time, bleeding risks seem to outweight the benefits.  Angina when he gets very anemic.  Better after transfusion.   2. AFib: Occurred at the time of CABG.  Now off of Amiodarone.  No palpitations.   3. Hyperlipidemia: Atorvastatin decreased from 80 due to very low LDL.  LDL 49 in 10/17.  Off of atorvastatin now.  4. Anemia/thrombocytopenia: Receiving transfusions. 5. HTN: Controlled.  Continue current meds.  BP low at times.  He will skip his lisinopril those days.   Current medicines are reviewed at length with the patient today.  The patient concerns regarding his medicines were addressed.  The following changes have been made:  No change  Labs/ tests ordered today include:  No orders of the defined types were placed in this  encounter.   Recommend 150 minutes/week of aerobic exercise Low fat, low carb, high fiber diet recommended  Disposition:   FU in 1 year   Signed, Larae Grooms, MD  11/09/2016 11:12 AM    [address], [place], [place]  [address] Phone: [phone]; Fax: [phone]

## 2016-11-09 ENCOUNTER — Telehealth: Payer: Self-pay | Admitting: *Deleted

## 2016-11-09 ENCOUNTER — Ambulatory Visit (INDEPENDENT_AMBULATORY_CARE_PROVIDER_SITE_OTHER): Payer: Medicare Other | Admitting: Interventional Cardiology

## 2016-11-09 ENCOUNTER — Encounter: Payer: Self-pay | Admitting: Interventional Cardiology

## 2016-11-09 ENCOUNTER — Telehealth: Payer: Self-pay

## 2016-11-09 VITALS — BP 100/52 | HR 81 | Ht 67.0 in | Wt 150.4 lb

## 2016-11-09 DIAGNOSIS — Z951 Presence of aortocoronary bypass graft: Secondary | ICD-10-CM | POA: Diagnosis not present

## 2016-11-09 DIAGNOSIS — I480 Paroxysmal atrial fibrillation: Secondary | ICD-10-CM

## 2016-11-09 DIAGNOSIS — I2510 Atherosclerotic heart disease of native coronary artery without angina pectoris: Secondary | ICD-10-CM

## 2016-11-09 DIAGNOSIS — I10 Essential (primary) hypertension: Secondary | ICD-10-CM | POA: Diagnosis not present

## 2016-11-09 NOTE — Telephone Encounter (Signed)
Wife called stating pt usually gets blood every 10 days and that will be Thursday. He is very weak. His bp 100/52 at Dr Irish Lack today. SOB.  She is asking if he can come in tomorrow. Explained I will pass message to Dr Alvy Bimler. If he gets worse, weaker, confused, to go to ER.

## 2016-11-09 NOTE — Telephone Encounter (Signed)
Yes, please send scheduling msg and call him to come in early. If we do not have place to treat we will send him to [facility]

## 2016-11-09 NOTE — Telephone Encounter (Signed)
Pt coming in at 0830 for labs

## 2016-11-09 NOTE — Patient Instructions (Addendum)
Medication Instructions:  Same-no changes  Labwork: None  Testing/Procedures: none  Follow-Up: Your physician wants you to follow-up in: 1 year. You will receive a reminder letter in the mail two months in advance. If you don't receive a letter, please call our office to schedule the follow-up appointment.   Any Other Special Instructions Will Be Listed Below (If Applicable). Do not take Lisinopril dose on days that your blood pressure is low.     If you need a refill on your cardiac medications before your next appointment, please call your pharmacy.

## 2016-11-10 ENCOUNTER — Ambulatory Visit (HOSPITAL_BASED_OUTPATIENT_CLINIC_OR_DEPARTMENT_OTHER): Payer: Medicare Other | Admitting: Hematology and Oncology

## 2016-11-10 ENCOUNTER — Ambulatory Visit (HOSPITAL_COMMUNITY)
Admission: RE | Admit: 2016-11-10 | Discharge: 2016-11-10 | Disposition: A | Discharge status: Home / Self Care | Payer: Medicare Other | Source: Ambulatory Visit | Attending: Hematology and Oncology | Admitting: Hematology and Oncology

## 2016-11-10 ENCOUNTER — Other Ambulatory Visit (HOSPITAL_BASED_OUTPATIENT_CLINIC_OR_DEPARTMENT_OTHER): Payer: Medicare Other

## 2016-11-10 ENCOUNTER — Telehealth: Payer: Self-pay | Admitting: Hematology and Oncology

## 2016-11-10 ENCOUNTER — Other Ambulatory Visit: Payer: Self-pay | Admitting: Hematology and Oncology

## 2016-11-10 DIAGNOSIS — D4620 Refractory anemia with excess of blasts, unspecified: Secondary | ICD-10-CM

## 2016-11-10 DIAGNOSIS — R509 Fever, unspecified: Secondary | ICD-10-CM | POA: Diagnosis not present

## 2016-11-10 DIAGNOSIS — R5081 Fever presenting with conditions classified elsewhere: Secondary | ICD-10-CM

## 2016-11-10 DIAGNOSIS — I517 Cardiomegaly: Secondary | ICD-10-CM | POA: Insufficient documentation

## 2016-11-10 DIAGNOSIS — R918 Other nonspecific abnormal finding of lung field: Secondary | ICD-10-CM

## 2016-11-10 DIAGNOSIS — D709 Neutropenia, unspecified: Secondary | ICD-10-CM | POA: Diagnosis present

## 2016-11-10 DIAGNOSIS — A419 Sepsis, unspecified organism: Secondary | ICD-10-CM | POA: Diagnosis not present

## 2016-11-10 DIAGNOSIS — G893 Neoplasm related pain (acute) (chronic): Secondary | ICD-10-CM | POA: Diagnosis not present

## 2016-11-10 DIAGNOSIS — D471 Chronic myeloproliferative disease: Secondary | ICD-10-CM

## 2016-11-10 DIAGNOSIS — D46Z Other myelodysplastic syndromes: Secondary | ICD-10-CM

## 2016-11-10 DIAGNOSIS — R0602 Shortness of breath: Secondary | ICD-10-CM | POA: Diagnosis not present

## 2016-11-10 LAB — CBC WITH DIFFERENTIAL/PLATELET
BASO%: 0.9 % (ref 0.0–2.0)
BASOS ABS: 0.1 10*3/uL (ref 0.0–0.1)
EOS ABS: 0 10*3/uL (ref 0.0–0.5)
EOS%: 0 % (ref 0.0–7.0)
HCT: 22.7 % — ABNORMAL LOW (ref 38.4–49.9)
HEMOGLOBIN: 7.4 g/dL — AB (ref 13.0–17.1)
LYMPH%: 12.5 % — AB (ref 14.0–49.0)
MCH: 27.5 pg (ref 27.2–33.4)
MCHC: 32.7 g/dL (ref 32.0–36.0)
MCV: 84.2 fL (ref 79.3–98.0)
MONO#: 4.4 10*3/uL — AB (ref 0.1–0.9)
MONO%: 35.8 % — AB (ref 0.0–14.0)
NEUT#: 6.3 10*3/uL (ref 1.5–6.5)
NEUT%: 50.8 % (ref 39.0–75.0)
PLATELETS: 20 10*3/uL — AB (ref 140–400)
RBC: 2.7 10*6/uL — ABNORMAL LOW (ref 4.20–5.82)
RDW: 17 % — AB (ref 11.0–14.6)
WBC: 12.4 10*3/uL — ABNORMAL HIGH (ref 4.0–10.3)
lymph#: 1.5 10*3/uL (ref 0.9–3.3)

## 2016-11-10 LAB — PREPARE RBC (CROSSMATCH)

## 2016-11-10 LAB — TECHNOLOGIST REVIEW

## 2016-11-10 MED ORDER — AMOXICILLIN-POT CLAVULANATE 875-125 MG PO TABS: 1.0000 | ORAL_TABLET | Freq: Two times a day (BID) | ORAL | 0 refills | Status: AC

## 2016-11-10 MED ORDER — SODIUM CHLORIDE 0.9% FLUSH: 10.0000 mL | INTRAVENOUS | Status: DC | PRN

## 2016-11-10 MED ORDER — HEPARIN SOD (PORK) LOCK FLUSH 100 UNIT/ML IV SOLN: 500.0000 [IU] | Freq: Every day | INTRAVENOUS | Status: DC | PRN

## 2016-11-10 MED ORDER — SODIUM CHLORIDE 0.9 % IV SOLN
250.0000 mL | Freq: Once | INTRAVENOUS | Status: AC
  Administered 2016-11-10: 250 mL via INTRAVENOUS

## 2016-11-10 MED ORDER — SODIUM CHLORIDE 0.9% FLUSH: 3.0000 mL | INTRAVENOUS | Status: DC | PRN

## 2016-11-10 MED ORDER — HEPARIN SOD (PORK) LOCK FLUSH 100 UNIT/ML IV SOLN: 250.0000 [IU] | INTRAVENOUS | Status: DC | PRN

## 2016-11-10 MED ORDER — ACETAMINOPHEN 325 MG PO TABS
650.0000 mg | ORAL_TABLET | Freq: Once | ORAL | Status: AC
  Administered 2016-11-10: 650 mg via ORAL
  Filled 2016-11-10 (×2): qty 2

## 2016-11-10 MED ORDER — OXYCODONE HCL 15 MG PO TABS: 15.0000 mg | ORAL_TABLET | ORAL | 0 refills | Status: AC | PRN

## 2016-11-10 MED ORDER — GUAIFENESIN-CODEINE 100-10 MG/5ML PO SYRP: 5.0000 mL | ORAL_SOLUTION | Freq: Three times a day (TID) | ORAL | 0 refills | Status: AC | PRN

## 2016-11-10 MED ORDER — CIPROFLOXACIN HCL 250 MG PO TABS: 250.0000 mg | ORAL_TABLET | Freq: Two times a day (BID) | ORAL | 0 refills | Status: AC

## 2016-11-10 MED ORDER — DIPHENHYDRAMINE HCL 25 MG PO CAPS
25.0000 mg | ORAL_CAPSULE | Freq: Once | ORAL | Status: AC
  Administered 2016-11-10: 25 mg via ORAL
  Filled 2016-11-10: qty 1

## 2016-11-10 NOTE — Progress Notes (Signed)
Pt complained of pain in the area of the spleen. Dr. Calton Dach office notified and spoke with nurse Tammi.  Will check with Dr. Alvy Bimler and give [place] a call back. Requested wife to give him one of his Tramadol. Will cont to monitor patient.

## 2016-11-10 NOTE — Telephone Encounter (Signed)
Appointments scheduled per 12/6 LOS. AVS and calendars printed to give to patient in infusion. Desk nurse will give to patient.

## 2016-11-10 NOTE — Progress Notes (Signed)
Dr. Alvy Bimler here to see patient.

## 2016-11-10 NOTE — Progress Notes (Signed)
Diagnosis Association: MDS (myelodysplastic syndrome), low grade ([place]) (D46.20)  Provider: Natale Lay Procedure: Patient received 2 units of PRBC's  Post procedure patient was alert orient and ambulatory at discharge. Discharge instructions given to patient and he states an understanding. Wife at bedside to transport patient home.

## 2016-11-10 NOTE — Progress Notes (Signed)
Dr. Calton Dach office notified of patient's vital signs. Pt's temp was 102.1, b/p 96/54, hr 116, O2 sat 96 on ra at 1019.  Rechecking the temp was 101. Dr. Alvy Bimler office returned call and wanted his pre med of Tylenol along with the Benadryl given.  Will cont to monitor pt.

## 2016-11-11 ENCOUNTER — Encounter: Payer: Self-pay | Admitting: Hematology and Oncology

## 2016-11-11 DIAGNOSIS — G893 Neoplasm related pain (acute) (chronic): Secondary | ICD-10-CM | POA: Insufficient documentation

## 2016-11-11 LAB — TYPE AND SCREEN
ABO/RH(D): O POS
Antibody Screen: NEGATIVE
Unit division: 0
Unit division: 0

## 2016-11-11 NOTE — Progress Notes (Signed)
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 He is seen due to feeling unwell. He was noted to be hypotensive. He complained of weakness, fever, chills with cough. His blood count is low. He has significant left upper quadrant pain from splenomegaly. His symptoms of cough, fever and chills only started the same day. He denies nausea or vomiting but does have reduced appetite. Denies diarrhea or constipation.  REVIEW OF SYSTEMS:   Eyes: Denies blurriness of vision Ears, nose, mouth, throat, and face: Denies mucositis or sore throat Cardiovascular: Denies palpitation, chest discomfort or lower extremity swelling Gastrointestinal:  Denies nausea, heartburn or change in bowel habits Skin: Denies abnormal skin rashes Lymphatics: Denies new lymphadenopathy or easy bruising Neurological:Denies numbness, tingling  Behavioral/Psych: Mood is stable, no new changes  All other systems were reviewed with the patient and are negative.  I have reviewed the past medical history, past surgical history, social history and family  history with the patient and they are unchanged from previous note.  ALLERGIES:  has No Known Allergies.  MEDICATIONS:  Current Outpatient Prescriptions  Medication Sig Dispense Refill  . acetaminophen (TYLENOL) 500 MG tablet Take 1,000 mg by mouth every 6 (six) hours as needed for pain. Reported on 06/21/2016    . allopurinol (ZYLOPRIM) 100 MG tablet Take 50 mg by mouth daily.     Marland Kitchen ALPRAZolam (XANAX) 0.5 MG tablet   0  . amoxicillin-clavulanate (AUGMENTIN) 875-125 MG tablet Take 1 tablet by mouth 2 (two) times daily. 14 tablet 0  . Calcium-Vitamin D (CALTRATE 600 PLUS-VIT D PO) Take 1 tablet by mouth 2 (two) times daily.     . ciprofloxacin (CIPRO) 250 MG tablet Take 1 tablet (250 mg total) by mouth 2 (two) times daily. 14 tablet 0  . cyclobenzaprine (FLEXERIL) 10 MG tablet Take 1 tablet (10 mg total) by mouth 3 (three) times daily as needed for muscle spasms. 90 tablet 6  . guaiFENesin-codeine (ROBITUSSIN AC) 100-10 MG/5ML syrup Take 5 mLs by mouth 3 (three) times daily as needed for cough. 120 mL 0  . lisinopril-hydrochlorothiazide (PRINZIDE,ZESTORETIC) 10-12.5 MG tablet take 1/2 tablet by mouth once daily 45 tablet 1  . loratadine (CLARITIN) 10 MG tablet Take 10 mg by mouth daily.    . metoprolol (LOPRESSOR) 50 MG tablet take 1/2 tablet by mouth twice a day 90 tablet 0  . MULTIPLE VITAMINS-MINERALS PO Take 1 tablet by mouth 2 (two) times daily. Reported on 06/21/2016    . NITROSTAT 0.4 MG SL tablet Place 0.4 mg under the tongue every 5 (five) minutes as needed. Reported on 06/21/2016  0  . Omega-3  Fatty Acids (FISH OIL) 1000 MG CAPS Take 2,000 mg by mouth 2 (two) times daily.    Marland Kitchen oxyCODONE (ROXICODONE) 15 MG immediate release tablet Take 1 tablet (15 mg total) by mouth every 4 (four) hours as needed. 90 tablet 0  . predniSONE (DELTASONE) 10 MG tablet Take 20 mg (2 tablets) daily 90 tablet 6  . traMADol (ULTRAM) 50 MG tablet Take 25 mg by mouth every 4 (four) hours as needed for pain.       No current facility-administered medications for this visit.    Facility-Administered Medications Ordered in Other Visits  Medication Dose Route Frequency Provider Last Rate Last Dose  . 0.9 %  sodium chloride infusion  250 mL Intravenous Once Heath Lark, MD        PHYSICAL EXAMINATION: ECOG PERFORMANCE STATUS: 2 - Symptomatic, <50% confined to bed GENERAL:alert, no distress and comfortable SKIN: Significant bruising is noted EYES: normal, Conjunctiva are pink and non-injected, sclera clear OROPHARYNX:no exudate, no erythema and lips, buccal mucosa, and tongue normal  NECK: supple, thyroid normal size, non-tender, without nodularity LYMPH:  no palpable lymphadenopathy in the cervical, axillary or inguinal LUNGS: Mild increased breathing effort. Limited exam due to coughing but no crackles HEART: Tachycardia, regular rate and rhythm with no lower extremity edema ABDOMEN:abdomen soft, non-tender and normal bowel sounds. Massive splenomegaly Musculoskeletal:no cyanosis of digits and no clubbing  NEURO: alert & oriented x 3 with fluent speech, no focal motor/sensory deficits  LABORATORY DATA:  I have reviewed the data as listed    Component Value Date/Time   NA 136 11/16/2015 2250   NA 138 05/30/2014 1138   K 3.7 11/16/2015 2250   K 3.7 05/30/2014 1138   CL 106 11/16/2015 2250   CL 102 04/09/2013 1347   CO2 20 (L) 11/16/2015 2250   CO2 24 05/30/2014 1138   GLUCOSE 120 (H) 11/16/2015 2250   GLUCOSE 147 (H) 05/30/2014 1138   GLUCOSE 113 (H) 04/09/2013 1347   BUN 16 11/16/2015 2250   BUN 26.2 (H) 05/30/2014 1138   CREATININE 0.78 11/16/2015 2250   CREATININE 0.8 05/30/2014 1138   CALCIUM 8.6 (L) 11/16/2015 2250   CALCIUM 9.8 05/30/2014 1138   PROT 6.9 11/16/2015 2250   PROT 7.4 11/22/2013 0847   ALBUMIN 2.7 (L) 11/16/2015 2250   ALBUMIN 4.1 11/22/2013 0847   AST 26 11/16/2015 2250   AST 15 11/22/2013 0847   ALT 19 11/16/2015 2250   ALT 15 11/22/2013 0847   ALKPHOS 93  11/16/2015 2250   ALKPHOS 48 11/22/2013 0847   BILITOT 1.1 11/16/2015 2250   BILITOT 1.15 11/22/2013 0847   GFRNONAA >60 11/16/2015 2250   GFRAA >60 11/16/2015 2250    No results found for: SPEP, UPEP  Lab Results  Component Value Date   WBC 12.4 (H) 11/10/2016   NEUTROABS 6.3 11/10/2016   HGB 7.4 (L) 11/10/2016   HCT 22.7 (L) 11/10/2016   MCV 84.2 11/10/2016   PLT 20 (L) 11/10/2016      Chemistry      Component Value Date/Time   NA 136 11/16/2015 2250   NA 138 05/30/2014 1138   K 3.7 11/16/2015 2250   K 3.7 05/30/2014 1138   CL 106 11/16/2015 2250   CL 102 04/09/2013 1347   CO2 20 (L) 11/16/2015 2250   CO2 24 05/30/2014 1138   BUN 16 11/16/2015 2250   BUN 26.2 (H) 05/30/2014 1138   CREATININE 0.78 11/16/2015 2250   CREATININE 0.8 05/30/2014 1138  Component Value Date/Time   CALCIUM 8.6 (L) 11/16/2015 2250   CALCIUM 9.8 05/30/2014 1138   ALKPHOS 93 11/16/2015 2250   ALKPHOS 48 11/22/2013 0847   AST 26 11/16/2015 2250   AST 15 11/22/2013 0847   ALT 19 11/16/2015 2250   ALT 15 11/22/2013 0847   BILITOT 1.1 11/16/2015 2250   BILITOT 1.15 11/22/2013 0847       RADIOGRAPHIC STUDIES: I have personally reviewed the radiological images as listed and agreed with the findings in the report. Dg Chest 2 View  Result Date: 11/10/2016 CLINICAL DATA:  Myelodysplastic syndrome with shortness of breath and cough. EXAM: CHEST  2 VIEW COMPARISON:  11/16/2015 FINDINGS: The cardio pericardial silhouette is enlarged. Symmetric, diffuse interstitial and central alveolar opacity suggests edema. No substantial pleural effusion. The visualized bony structures of the thorax are intact. Patient is status post CABG. IMPRESSION: Cardiomegaly with diffuse interstitial and central alveolar opacity suggests edema. Diffuse infection cannot be excluded. Electronically Signed   By: Misty Stanley M.D.   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 We discussed treatment options which are limited. The patient wants to preserve quality of life. At the time of discussion with the patient, he is in agreement to just receive palliative supportive care/transfusion support. In the meantime, we will continue blood transfusion support every 10 days to keep hemoglobin above 8 g. He will get 2 units of blood whenever hemoglobin is less than 8 g. He does not need platelet transfusion as he is asymptomatic from bleeding. We will only give him 1 unit of platelets if platelet count is less than 10,000 or if he has new onset of bleeding. The patient is symptomatic and received 2 units of blood today  Fever and chills He has cough, fever and chills. Due to immunocompromise state, he is at risks of infection. I recommend dual antibiotic coverage with Augmentin and ciprofloxacin.  I will also prescribe prescription cough syrup Chest x-ray is reviewed which showed mild infiltrate. I plan to see him back next week to assess response to treatment  Cancer associated pain He has massive splenomegaly and left upper quadrant pain. We discussed pain management. I refilled his prescription pain medicine   Orders Placed This Encounter  Procedures  . Comprehensive metabolic panel    Standing Status:   Future    Standing Expiration Date:   12/15/2017   All questions were answered. The patient knows to call the clinic with any problems, questions or concerns. No barriers to learning was detected. I spent 25 minutes counseling the patient face to face. The total time spent in the appointment was 30 minutes and more than 50% was on counseling and review of test results     Heath Lark, MD 11/11/2016 7:51 AM

## 2016-11-11 NOTE — Assessment & Plan Note (Signed)
He has massive splenomegaly and left upper quadrant pain. We discussed pain management. I refilled his prescription pain medicine

## 2016-11-11 NOTE — Assessment & Plan Note (Addendum)
He has cough, fever and chills. Due to immunocompromise state, he is at risks of infection. I recommend dual antibiotic coverage with Augmentin and ciprofloxacin.  I will also prescribe prescription cough syrup Chest x-ray is reviewed which showed mild infiltrate. I plan to see him back next week to assess response to treatment

## 2016-11-11 NOTE — Assessment & Plan Note (Signed)
The patient is not a candidate for clinical trial. We discussed treatment options which are limited. The patient wants to preserve quality of life. At the time of discussion with the patient, he is in agreement to just receive palliative supportive care/transfusion support. In the meantime, we will continue blood transfusion support every 10 days to keep hemoglobin above 8 g. He will get 2 units of blood whenever hemoglobin is less than 8 g. He does not need platelet transfusion as he is asymptomatic from bleeding. We will only give him 1 unit of platelets if platelet count is less than 10,000 or if he has new onset of bleeding. The patient is symptomatic and received 2 units of blood today

## 2016-11-12 ENCOUNTER — Emergency Department (HOSPITAL_COMMUNITY): Payer: Medicare Other

## 2016-11-12 ENCOUNTER — Inpatient Hospital Stay (HOSPITAL_COMMUNITY)
Admission: EM | Admit: 2016-11-12 | Discharge: 2016-12-06 | DRG: 871 | Disposition: E | Payer: Medicare Other | Attending: Pulmonary Disease | Admitting: Pulmonary Disease

## 2016-11-12 ENCOUNTER — Encounter (HOSPITAL_COMMUNITY): Payer: Self-pay | Admitting: Emergency Medicine

## 2016-11-12 ENCOUNTER — Other Ambulatory Visit: Payer: Self-pay

## 2016-11-12 ENCOUNTER — Inpatient Hospital Stay (HOSPITAL_COMMUNITY): Payer: Medicare Other | Admitting: Registered Nurse

## 2016-11-12 ENCOUNTER — Other Ambulatory Visit: Payer: Medicare Other

## 2016-11-12 ENCOUNTER — Inpatient Hospital Stay (HOSPITAL_COMMUNITY): Payer: Medicare Other

## 2016-11-12 DIAGNOSIS — A419 Sepsis, unspecified organism: Secondary | ICD-10-CM | POA: Diagnosis present

## 2016-11-12 DIAGNOSIS — Z951 Presence of aortocoronary bypass graft: Secondary | ICD-10-CM

## 2016-11-12 DIAGNOSIS — I472 Ventricular tachycardia: Secondary | ICD-10-CM | POA: Diagnosis not present

## 2016-11-12 DIAGNOSIS — J9601 Acute respiratory failure with hypoxia: Secondary | ICD-10-CM | POA: Diagnosis present

## 2016-11-12 DIAGNOSIS — I5031 Acute diastolic (congestive) heart failure: Secondary | ICD-10-CM | POA: Diagnosis present

## 2016-11-12 DIAGNOSIS — Z7952 Long term (current) use of systemic steroids: Secondary | ICD-10-CM

## 2016-11-12 DIAGNOSIS — R0602 Shortness of breath: Secondary | ICD-10-CM | POA: Diagnosis present

## 2016-11-12 DIAGNOSIS — J9602 Acute respiratory failure with hypercapnia: Secondary | ICD-10-CM | POA: Diagnosis present

## 2016-11-12 DIAGNOSIS — R161 Splenomegaly, not elsewhere classified: Secondary | ICD-10-CM | POA: Diagnosis present

## 2016-11-12 DIAGNOSIS — I959 Hypotension, unspecified: Secondary | ICD-10-CM | POA: Diagnosis present

## 2016-11-12 DIAGNOSIS — Z66 Do not resuscitate: Secondary | ICD-10-CM | POA: Diagnosis present

## 2016-11-12 DIAGNOSIS — I2510 Atherosclerotic heart disease of native coronary artery without angina pectoris: Secondary | ICD-10-CM | POA: Diagnosis present

## 2016-11-12 DIAGNOSIS — Z96651 Presence of right artificial knee joint: Secondary | ICD-10-CM | POA: Diagnosis present

## 2016-11-12 DIAGNOSIS — I509 Heart failure, unspecified: Secondary | ICD-10-CM | POA: Diagnosis not present

## 2016-11-12 DIAGNOSIS — J189 Pneumonia, unspecified organism: Secondary | ICD-10-CM | POA: Diagnosis present

## 2016-11-12 DIAGNOSIS — I110 Hypertensive heart disease with heart failure: Secondary | ICD-10-CM | POA: Diagnosis present

## 2016-11-12 DIAGNOSIS — M069 Rheumatoid arthritis, unspecified: Secondary | ICD-10-CM | POA: Diagnosis present

## 2016-11-12 DIAGNOSIS — D4620 Refractory anemia with excess of blasts, unspecified: Secondary | ICD-10-CM | POA: Diagnosis present

## 2016-11-12 DIAGNOSIS — G931 Anoxic brain damage, not elsewhere classified: Secondary | ICD-10-CM | POA: Diagnosis not present

## 2016-11-12 DIAGNOSIS — E872 Acidosis: Secondary | ICD-10-CM | POA: Diagnosis present

## 2016-11-12 DIAGNOSIS — Z8249 Family history of ischemic heart disease and other diseases of the circulatory system: Secondary | ICD-10-CM

## 2016-11-12 DIAGNOSIS — Z79899 Other long term (current) drug therapy: Secondary | ICD-10-CM

## 2016-11-12 DIAGNOSIS — R778 Other specified abnormalities of plasma proteins: Secondary | ICD-10-CM

## 2016-11-12 DIAGNOSIS — E785 Hyperlipidemia, unspecified: Secondary | ICD-10-CM | POA: Diagnosis present

## 2016-11-12 DIAGNOSIS — D61818 Other pancytopenia: Secondary | ICD-10-CM | POA: Diagnosis present

## 2016-11-12 DIAGNOSIS — R7989 Other specified abnormal findings of blood chemistry: Secondary | ICD-10-CM

## 2016-11-12 DIAGNOSIS — Z87891 Personal history of nicotine dependence: Secondary | ICD-10-CM

## 2016-11-12 DIAGNOSIS — D471 Chronic myeloproliferative disease: Secondary | ICD-10-CM | POA: Diagnosis present

## 2016-11-12 DIAGNOSIS — E8770 Fluid overload, unspecified: Secondary | ICD-10-CM

## 2016-11-12 DIAGNOSIS — M109 Gout, unspecified: Secondary | ICD-10-CM | POA: Diagnosis present

## 2016-11-12 DIAGNOSIS — I469 Cardiac arrest, cause unspecified: Secondary | ICD-10-CM | POA: Diagnosis not present

## 2016-11-12 DIAGNOSIS — D46Z Other myelodysplastic syndromes: Secondary | ICD-10-CM | POA: Diagnosis present

## 2016-11-12 DIAGNOSIS — I480 Paroxysmal atrial fibrillation: Secondary | ICD-10-CM | POA: Diagnosis present

## 2016-11-12 DIAGNOSIS — I4891 Unspecified atrial fibrillation: Secondary | ICD-10-CM | POA: Diagnosis not present

## 2016-11-12 DIAGNOSIS — I214 Non-ST elevation (NSTEMI) myocardial infarction: Secondary | ICD-10-CM | POA: Diagnosis not present

## 2016-11-12 DIAGNOSIS — R570 Cardiogenic shock: Secondary | ICD-10-CM

## 2016-11-12 DIAGNOSIS — D696 Thrombocytopenia, unspecified: Secondary | ICD-10-CM | POA: Diagnosis present

## 2016-11-12 DIAGNOSIS — N179 Acute kidney failure, unspecified: Secondary | ICD-10-CM | POA: Diagnosis present

## 2016-11-12 DIAGNOSIS — Y95 Nosocomial condition: Secondary | ICD-10-CM | POA: Diagnosis present

## 2016-11-12 LAB — PROTIME-INR
INR: 2.05
INR: 1.87
PROTHROMBIN TIME: 23.4 s — AB (ref 11.4–15.2)
PROTHROMBIN TIME: 21.8 s — AB (ref 11.4–15.2)

## 2016-11-12 LAB — CBC
HEMATOCRIT: 26.5 % — AB (ref 39.0–52.0)
HEMOGLOBIN: 9.1 g/dL — AB (ref 13.0–17.0)
MCH: 28.4 pg (ref 26.0–34.0)
MCHC: 34.3 g/dL (ref 30.0–36.0)
MCV: 82.8 fL (ref 78.0–100.0)
Platelets: 26 10*3/uL — CL (ref 150–400)
RBC: 3.2 MIL/uL — AB (ref 4.22–5.81)
RDW: 17.7 % — ABNORMAL HIGH (ref 11.5–15.5)
WBC: 18.4 10*3/uL — ABNORMAL HIGH (ref 4.0–10.5)

## 2016-11-12 LAB — COMPREHENSIVE METABOLIC PANEL
ALBUMIN: 3.4 g/dL — AB (ref 3.5–5.0)
ALK PHOS: 88 U/L (ref 38–126)
ALT: 315 U/L — ABNORMAL HIGH (ref 17–63)
ANION GAP: 12 (ref 5–15)
AST: 330 U/L — ABNORMAL HIGH (ref 15–41)
BILIRUBIN TOTAL: 3.1 mg/dL — AB (ref 0.3–1.2)
BUN: 49 mg/dL — ABNORMAL HIGH (ref 6–20)
CALCIUM: 8.5 mg/dL — AB (ref 8.9–10.3)
CO2: 19 mmol/L — ABNORMAL LOW (ref 22–32)
Chloride: 101 mmol/L (ref 101–111)
Creatinine, Ser: 0.88 mg/dL (ref 0.61–1.24)
Glucose, Bld: 115 mg/dL — ABNORMAL HIGH (ref 65–99)
POTASSIUM: 4.3 mmol/L (ref 3.5–5.1)
Sodium: 132 mmol/L — ABNORMAL LOW (ref 135–145)
TOTAL PROTEIN: 6.8 g/dL (ref 6.5–8.1)

## 2016-11-12 LAB — BASIC METABOLIC PANEL
Anion gap: 12 (ref 5–15)
BUN: 50 mg/dL — ABNORMAL HIGH (ref 6–20)
CO2: 17 mmol/L — AB (ref 22–32)
Calcium: 8.9 mg/dL (ref 8.9–10.3)
Chloride: 100 mmol/L — ABNORMAL LOW (ref 101–111)
Creatinine, Ser: 1.02 mg/dL (ref 0.61–1.24)
GFR calc non Af Amer: >60 mL/min (ref >60)
Glucose, Bld: 147 mg/dL — ABNORMAL HIGH (ref 65–99)
POTASSIUM: 5.6 mmol/L — AB (ref 3.5–5.1)
SODIUM: 129 mmol/L — AB (ref 135–145)

## 2016-11-12 LAB — STREP PNEUMONIAE URINARY ANTIGEN: STREP PNEUMO URINARY ANTIGEN: NEGATIVE

## 2016-11-12 LAB — BLOOD GAS, ARTERIAL
ACID-BASE DEFICIT: 16 mmol/L — AB (ref 0.0–2.0)
Bicarbonate: 12.6 mmol/L — ABNORMAL LOW (ref 20.0–28.0)
DRAWN BY: 331471
FIO2: 100
LHR: 14 {breaths}/min
MECHVT: 530 mL
O2 Saturation: 78.1 %
PATIENT TEMPERATURE: 98.6
PCO2 ART: 43.6 mmHg (ref 32.0–48.0)
PEEP/CPAP: 5 cmH2O
PO2 ART: 65.8 mmHg — AB (ref 83.0–108.0)
pH, Arterial: 7.09 — CL (ref 7.350–7.450)

## 2016-11-12 LAB — CBC WITH DIFFERENTIAL/PLATELET
BLASTS: 3 %
Band Neutrophils: 22 %
Basophils Absolute: 0 10*3/uL (ref 0.0–0.1)
Basophils Relative: 0 %
EOS PCT: 0 %
Eosinophils Absolute: 0 10*3/uL (ref 0.0–0.7)
HEMATOCRIT: 26.4 % — AB (ref 39.0–52.0)
HEMOGLOBIN: 9.1 g/dL — AB (ref 13.0–17.0)
LYMPHS ABS: 0.6 10*3/uL — AB (ref 0.7–4.0)
Lymphocytes Relative: 4 %
MCH: 28.8 pg (ref 26.0–34.0)
MCHC: 34.5 g/dL (ref 30.0–36.0)
MCV: 83.5 fL (ref 78.0–100.0)
MONOS PCT: 21 %
MYELOCYTES: 10 %
Metamyelocytes Relative: 1 %
Monocytes Absolute: 3.2 10*3/uL — ABNORMAL HIGH (ref 0.1–1.0)
NEUTROS PCT: 39 %
NRBC: 0 /100{WBCs}
Neutro Abs: 10.9 10*3/uL — ABNORMAL HIGH (ref 1.7–7.7)
Other: 0 %
Platelets: 25 10*3/uL — CL (ref 150–400)
Promyelocytes Absolute: 0 %
RBC: 3.16 MIL/uL — AB (ref 4.22–5.81)
RDW: 17.5 % — AB (ref 11.5–15.5)
WBC: 15.2 10*3/uL — AB (ref 4.0–10.5)

## 2016-11-12 LAB — URINALYSIS, ROUTINE W REFLEX MICROSCOPIC
Bilirubin Urine: NEGATIVE
Glucose, UA: NEGATIVE mg/dL
HGB URINE DIPSTICK: NEGATIVE
Ketones, ur: 5 mg/dL — AB
LEUKOCYTES UA: NEGATIVE
NITRITE: NEGATIVE
PROTEIN: NEGATIVE mg/dL
Specific Gravity, Urine: 1.02 (ref 1.005–1.030)
pH: 5 (ref 5.0–8.0)

## 2016-11-12 LAB — GLUCOSE, CAPILLARY
GLUCOSE-CAPILLARY: 151 mg/dL — AB (ref 65–99)
GLUCOSE-CAPILLARY: 21 mg/dL — AB (ref 65–99)

## 2016-11-12 LAB — BRAIN NATRIURETIC PEPTIDE: B NATRIURETIC PEPTIDE 5: 1220.7 pg/mL — AB (ref 0.0–100.0)

## 2016-11-12 LAB — I-STAT CG4 LACTIC ACID, ED
LACTIC ACID, VENOUS: 2.74 mmol/L — AB (ref 0.5–1.9)
Lactic Acid, Venous: 3.43 mmol/L (ref 0.5–1.9)

## 2016-11-12 LAB — TROPONIN I
TROPONIN I: 8.79 ng/mL — AB (ref <0.03)
TROPONIN I: 8.82 ng/mL — AB (ref <0.03)

## 2016-11-12 LAB — I-STAT TROPONIN, ED: Troponin i, poc: 9.18 ng/mL (ref 0.00–0.08)

## 2016-11-12 LAB — MRSA PCR SCREENING: MRSA BY PCR: NEGATIVE

## 2016-11-12 LAB — TYPE AND SCREEN
ABO/RH(D): O POS
ANTIBODY SCREEN: NEGATIVE

## 2016-11-12 LAB — APTT: APTT: 46 s — AB (ref 24–36)

## 2016-11-12 LAB — PATHOLOGIST SMEAR REVIEW

## 2016-11-12 LAB — MAGNESIUM: Magnesium: 2.2 mg/dL (ref 1.7–2.4)

## 2016-11-12 MED ORDER — PIPERACILLIN-TAZOBACTAM 3.375 G IVPB: 3.3750 g | Freq: Three times a day (TID) | INTRAVENOUS | Status: DC

## 2016-11-12 MED ORDER — FAMOTIDINE IN NACL 20-0.9 MG/50ML-% IV SOLN: 20.0000 mg | Freq: Two times a day (BID) | INTRAVENOUS | Status: DC

## 2016-11-12 MED ORDER — VANCOMYCIN HCL IN DEXTROSE 1-5 GM/200ML-% IV SOLN
1000.0000 mg | Freq: Once | INTRAVENOUS | Status: AC
  Administered 2016-11-12: 1000 mg via INTRAVENOUS
  Filled 2016-11-12: qty 200

## 2016-11-12 MED ORDER — PROPOFOL 10 MG/ML IV BOLUS
INTRAVENOUS | Status: DC | PRN
  Administered 2016-11-12: 110 mg via INTRAVENOUS

## 2016-11-12 MED ORDER — CHLORHEXIDINE GLUCONATE 0.12% ORAL RINSE (MEDLINE KIT): 15.0000 mL | Freq: Two times a day (BID) | OROMUCOSAL | Status: DC

## 2016-11-12 MED ORDER — PREDNISONE 5 MG PO TABS: 5.0000 mg | ORAL_TABLET | Freq: Two times a day (BID) | ORAL | Status: DC

## 2016-11-12 MED ORDER — DEXTROSE 5 % IV SOLN
1.0000 g | Freq: Three times a day (TID) | INTRAVENOUS | Status: DC
  Filled 2016-11-12 (×3): qty 1

## 2016-11-12 MED ORDER — GUAIFENESIN-CODEINE 100-10 MG/5ML PO SOLN: 5.0000 mL | Freq: Three times a day (TID) | ORAL | Status: DC | PRN

## 2016-11-12 MED ORDER — HYDROMORPHONE HCL 1 MG/ML IJ SOLN
1.0000 mg | Freq: Once | INTRAMUSCULAR | Status: AC
  Administered 2016-11-12: 1 mg via INTRAVENOUS
  Filled 2016-11-12: qty 1

## 2016-11-12 MED ORDER — SODIUM CHLORIDE 0.9 % IV BOLUS (SEPSIS)
1000.0000 mL | Freq: Once | INTRAVENOUS | Status: AC
  Administered 2016-11-12: 1000 mL via INTRAVENOUS

## 2016-11-12 MED ORDER — MIDAZOLAM HCL 2 MG/2ML IJ SOLN
1.0000 mg | INTRAMUSCULAR | Status: DC | PRN
  Administered 2016-11-12 (×2): 1 mg via INTRAVENOUS
  Filled 2016-11-12 (×2): qty 2

## 2016-11-12 MED ORDER — FENTANYL CITRATE (PF) 100 MCG/2ML IJ SOLN
50.0000 ug | INTRAMUSCULAR | Status: DC | PRN
  Administered 2016-11-12: 50 ug via INTRAVENOUS
  Filled 2016-11-12: qty 2

## 2016-11-12 MED ORDER — ALPRAZOLAM 0.5 MG PO TABS: 0.5000 mg | ORAL_TABLET | Freq: Four times a day (QID) | ORAL | Status: DC | PRN

## 2016-11-12 MED ORDER — FUROSEMIDE 10 MG/ML IJ SOLN: 20.0000 mg | Freq: Once | INTRAMUSCULAR | Status: DC

## 2016-11-12 MED ORDER — MIDAZOLAM HCL 2 MG/2ML IJ SOLN: 1.0000 mg | INTRAMUSCULAR | Status: DC | PRN

## 2016-11-12 MED ORDER — HYDROMORPHONE HCL 1 MG/ML IJ SOLN
1.0000 mg | INTRAMUSCULAR | Status: DC | PRN
  Administered 2016-11-12: 0.5 mg via INTRAVENOUS
  Filled 2016-11-12: qty 1

## 2016-11-12 MED ORDER — OXYCODONE HCL 5 MG PO TABS: 15.0000 mg | ORAL_TABLET | ORAL | Status: DC | PRN

## 2016-11-12 MED ORDER — AMIODARONE HCL IN DEXTROSE 360-4.14 MG/200ML-% IV SOLN: 30.0000 mg/h | INTRAVENOUS | Status: DC

## 2016-11-12 MED ORDER — PIPERACILLIN-TAZOBACTAM 3.375 G IVPB 30 MIN
3.3750 g | Freq: Once | INTRAVENOUS | Status: AC
  Administered 2016-11-12: 3.375 g via INTRAVENOUS
  Filled 2016-11-12: qty 50

## 2016-11-12 MED ORDER — PROPOFOL 1000 MG/100ML IV EMUL: 5.0000 ug/kg/min | INTRAVENOUS | Status: DC

## 2016-11-12 MED ORDER — SUCCINYLCHOLINE CHLORIDE 200 MG/10ML IV SOSY
PREFILLED_SYRINGE | INTRAVENOUS | Status: AC
  Filled 2016-11-12: qty 10

## 2016-11-12 MED ORDER — ORAL CARE MOUTH RINSE: 15.0000 mL | Freq: Four times a day (QID) | OROMUCOSAL | Status: DC

## 2016-11-12 MED ORDER — PROPOFOL 10 MG/ML IV BOLUS
INTRAVENOUS | Status: AC
  Filled 2016-11-12: qty 20

## 2016-11-12 MED ORDER — SODIUM BICARBONATE 8.4 % IV SOLN
INTRAVENOUS | Status: DC
  Filled 2016-11-12: qty 100

## 2016-11-12 MED ORDER — SODIUM BICARBONATE 8.4 % IV SOLN: 100.0000 meq | Freq: Once | INTRAVENOUS | Status: DC

## 2016-11-12 MED ORDER — SODIUM CHLORIDE 0.9 % IV SOLN
Freq: Once | INTRAVENOUS | Status: AC
  Administered 2016-11-12: 16:00:00 via INTRAVENOUS

## 2016-11-12 MED ORDER — FENTANYL CITRATE (PF) 100 MCG/2ML IJ SOLN: 50.0000 ug | INTRAMUSCULAR | Status: DC | PRN

## 2016-11-12 MED ORDER — MIDAZOLAM HCL 2 MG/2ML IJ SOLN
INTRAMUSCULAR | Status: AC
  Administered 2016-11-12: 1 mg
  Filled 2016-11-12: qty 2

## 2016-11-12 MED ORDER — DIGOXIN 0.25 MG/ML IJ SOLN
0.2500 mg | Freq: Once | INTRAMUSCULAR | Status: DC
  Filled 2016-11-12 (×2): qty 1

## 2016-11-12 MED ORDER — VANCOMYCIN HCL IN DEXTROSE 750-5 MG/150ML-% IV SOLN
750.0000 mg | Freq: Two times a day (BID) | INTRAVENOUS | Status: DC
  Filled 2016-11-12: qty 150

## 2016-11-12 MED ORDER — AMIODARONE HCL IN DEXTROSE 360-4.14 MG/200ML-% IV SOLN
60.0000 mg/h | INTRAVENOUS | Status: AC
  Administered 2016-11-12: 60 mg/h via INTRAVENOUS

## 2016-11-12 MED ORDER — NOREPINEPHRINE BITARTRATE 1 MG/ML IV SOLN
0.0000 ug/min | Freq: Once | INTRAVENOUS | Status: AC
  Administered 2016-11-12: 2 ug/min via INTRAVENOUS
  Filled 2016-11-12: qty 4

## 2016-11-12 MED ORDER — ALLOPURINOL 100 MG PO TABS: 50.0000 mg | ORAL_TABLET | Freq: Every day | ORAL | Status: DC

## 2016-11-12 MED ORDER — DEXTROSE 50 % IV SOLN
INTRAVENOUS | Status: AC
  Administered 2016-11-12: 25 mL
  Filled 2016-11-12: qty 50

## 2016-11-12 MED ORDER — SUCCINYLCHOLINE CHLORIDE 20 MG/ML IJ SOLN
INTRAMUSCULAR | Status: DC | PRN
  Administered 2016-11-12: 100 mg via INTRAVENOUS

## 2016-11-12 MED FILL — Medication: Qty: 1 | Status: AC

## 2016-11-13 LAB — URINE CULTURE: Culture: NO GROWTH

## 2016-11-13 LAB — HIV ANTIBODY (ROUTINE TESTING W REFLEX): HIV Screen 4th Generation wRfx: NONREACTIVE

## 2016-11-16 ENCOUNTER — Other Ambulatory Visit: Payer: Medicare Other

## 2016-11-16 ENCOUNTER — Ambulatory Visit: Payer: Medicare Other | Admitting: Hematology and Oncology

## 2016-11-17 LAB — CULTURE, BLOOD (ROUTINE X 2)
CULTURE: NO GROWTH
Culture: NO GROWTH

## 2016-11-22 ENCOUNTER — Other Ambulatory Visit: Payer: Medicare Other

## 2016-12-03 ENCOUNTER — Other Ambulatory Visit: Payer: Medicare Other

## 2016-12-06 NOTE — Progress Notes (Signed)
Hypoglycemic Event  CBG: 21 @ 2058  Treatment: D50 IV 25 mL  Symptoms: Sweaty  Follow-up CBG: Time: not done, pt expired   CBG Result:expired  Possible Reasons for Event: Unknown  Comments/MD notified: Elink    Mirren Gest, Daralene Milch

## 2016-12-06 NOTE — Progress Notes (Signed)
[facility] Progress Note Patient Name: Eric [name] DOB: 1946/01/14 MRN: [id number]   Date of Service  2016-11-30  HPI/Events of Note  Multiple issues: 1. ABG with pH = 7.09 - given NaHCO3 bolus and infusion, 2. Needs DVT (INR = 1.87 and platelets = 26K) and stress ulcer prophylaxis.   eICU Interventions  Will order: 1. Pepcid IV. 2. SCD's. 3. Repeat ABG at 9 PM.      Intervention Category Major Interventions: Acid-Base disturbance - evaluation and management Intermediate Interventions: Best-practice therapies (e.g. DVT, beta blocker, etc.)  Sommer,Steven Eugene 2016/11/30, 6:03 PM

## 2016-12-06 NOTE — ED Triage Notes (Signed)
Per EMS patient comes from home for SOB that started around 4am today. Patient coughing up blood since yesterday, bright red and sometimes darker red. Patient had recent blood transfusion for bone marrow cancer. Patient also hypotensive with EMS.  BP 96/63, HR 144, O2 98% on 2L via n/c. CBG 124.

## 2016-12-06 NOTE — ED Provider Notes (Signed)
[place] DEPT Provider Note   CSN: [id number] Arrival date & time: Nov 16, 2016  0805     History   Chief Complaint Chief Complaint  Patient presents with  . Shortness of Breath  . Hypotension    HPI Eric [name] is a 71 y.o. male.  The history is provided by the patient and medical records.  Shortness of Breath  Associated symptoms include cough.   71 year old male with history of coronary artery disease, dysrhythmia, gout, hypertension, rheumatoid arthritis, anemia, myelodysplastic syndrome currently undergoing palliative care and therapeutic blood transfusion, presenting to the ED for shortness of breath. Patient reports he had a blood transfusion 2 days ago due to low hemoglobin. He states he is supposed to undergo these every 10 days or so. States this morning around 4 AM he woke up with shortness of breath. States he has had some hemoptysis since yesterday, sometimes bright red, others darker red in color. Reports he did have a fever yesterday of 102F.  He denies chest pain.  No hx of DVT or PE.  Not currently on blood thinners.  Follows with heme/onc, Dr. Alvy Bimler.  Patient is DNR.  Past Medical History:  Diagnosis Date  . Anemia   . CAD (coronary artery disease), native coronary artery 01/09/2015   3 v dz, EF 50%  . Dysrhythmia   . Family history of ischemic heart disease   . Gout   . Headache 03/07/2014  . Hypertension   . MDS (myelodysplastic syndrome), low grade ([place]) 2012   on observation; MDS FISH panel on 04/26/2013 was normal; cytogenetics were also negative.   . Muscle spasm of back 04/05/2016  . Needs flu shot 08/23/2013  . Rheumatoid arthritis(714.0)    was on MTX until MDS dx  . Shortness of breath    exertional  . Tachycardia 2013   evaluated by Dr Irish Lack @ [facility]    Patient Active Problem List   Diagnosis Date Noted  . Cancer associated pain 11/11/2016  . Bacterial skin infection 07/16/2016  . DNR (do not resuscitate) discussion 06/21/2016  .  Pain in lower back 04/08/2016  . Muscle spasm of back 04/05/2016  . Splenomegaly 03/19/2016  . Fever and neutropenia ([place]) 01/05/2016  . Protein calorie malnutrition ([place]) 12/12/2015  . 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 Palliative care encounter 09/04/2015  . Fever and chills 08/22/2015  . Preventive measure 08/22/2015  . Chills (without fever) 06/24/2015  . Hypotension due to drugs 05/22/2015  . Coronary arteriosclerosis 04/21/2015  . Bradycardia, drug induced 04/03/2015  . PAF (paroxysmal atrial fibrillation) ([place]) 01/18/2015  . 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 Dyslipidemia, low HDL 01/10/2015  . Unstable angina ([place]) 01/09/2015  . Leukocytosis 10/03/2014  . Unspecified deficiency anemia 08/22/2014  . SVT (supraventricular tachycardia) ([place]) 04/08/2014  . Headache 03/07/2014  . Needs flu shot 08/23/2013  . Hyponatremia 02/08/2013  . Thrombocytopenia ([place]) 02/02/2013  . Osteoarthritis of right knee 02/02/2013  . HTN (hypertension) 02/02/2013  . Pancytopenia, acquired ([place])   . MDS (myelodysplastic syndrome), low grade ([place])   . Rheumatoid arthritis [facility])     Past Surgical History:  Procedure Laterality Date  . CIRCUMCISION    . CORONARY ARTERY BYPASS GRAFT N/A 01/13/2015   Procedure: CORONARY ARTERY BYPASS GRAFTING (CABG) times four, using left internal mammary artery and left greater saphenous vein.;  Surgeon: Ivin Poot, MD;  Location: [place];  Service: Open Heart Surgery;  Laterality: N/A;  . HERNIA REPAIR    . KNEE  ARTHROSCOPY Left   . LEFT HEART CATHETERIZATION WITH CORONARY ANGIOGRAM N/A 01/09/2015   Procedure: LEFT HEART CATHETERIZATION WITH CORONARY ANGIOGRAM;  Surgeon: Burnell Blanks, MD;  LAD 99/90%, D1 50%, CFX 40%, OM 70%, OM branch 80%, RCA  50%, dRCA 99%, PDA 50%, EF 50%  . TEE WITHOUT CARDIOVERSION N/A 01/13/2015   Procedure: TRANSESOPHAGEAL ECHOCARDIOGRAM (TEE);  Surgeon: Ivin Poot, MD;  Location: [place];  Service: Open Heart Surgery;   Laterality: N/A;  . TOTAL KNEE ARTHROPLASTY Right 02/02/2013   Procedure: TOTAL KNEE ARTHROPLASTY;  Surgeon: Yvette Rack., MD;  Location: [place];  Service: Orthopedics;  Laterality: Right;  RIGHT ARTHROPLASTY KNEE MEDIAL/LATERAL COMPARTMENTS WITH PATELLA RESURFACING   . UMBILICAL HERNIA REPAIR         Home Medications    Prior to Admission medications   Medication Sig Start Date End Date Taking? Authorizing Provider  acetaminophen (TYLENOL) 500 MG tablet Take 1,000 mg by mouth every 6 (six) hours as needed for pain. Reported on 06/21/2016    Historical Provider, MD  allopurinol (ZYLOPRIM) 100 MG tablet Take 50 mg by mouth daily.  04/14/13   Historical Provider, MD  ALPRAZolam Duanne Moron) 0.5 MG tablet  06/13/16   Historical Provider, MD  amoxicillin-clavulanate (AUGMENTIN) 875-125 MG tablet Take 1 tablet by mouth 2 (two) times daily. 11/10/16   Heath Lark, MD  Calcium-Vitamin D (CALTRATE 600 PLUS-VIT D PO) Take 1 tablet by mouth 2 (two) times daily.     Historical Provider, MD  ciprofloxacin (CIPRO) 250 MG tablet Take 1 tablet (250 mg total) by mouth 2 (two) times daily. 11/10/16   Heath Lark, MD  cyclobenzaprine (FLEXERIL) 10 MG tablet Take 1 tablet (10 mg total) by mouth 3 (three) times daily as needed for muscle spasms. 05/20/16   Heath Lark, MD  guaiFENesin-codeine (ROBITUSSIN AC) 100-10 MG/5ML syrup Take 5 mLs by mouth 3 (three) times daily as needed for cough. 11/10/16   Heath Lark, MD  lisinopril-hydrochlorothiazide (PRINZIDE,ZESTORETIC) 10-12.5 MG tablet take 1/2 tablet by mouth once daily 11/08/16   Jettie Booze, MD  loratadine (CLARITIN) 10 MG tablet Take 10 mg by mouth daily.    Historical Provider, MD  metoprolol (LOPRESSOR) 50 MG tablet take 1/2 tablet by mouth twice a day 10/22/16   Jettie Booze, MD  MULTIPLE VITAMINS-MINERALS PO Take 1 tablet by mouth 2 (two) times daily. Reported on 06/21/2016    Historical Provider, MD  NITROSTAT 0.4 MG SL tablet Place 0.4 mg under the  tongue every 5 (five) minutes as needed. Reported on 06/21/2016 12/31/14   Historical Provider, MD  Omega-3 Fatty Acids (FISH OIL) 1000 MG CAPS Take 2,000 mg by mouth 2 (two) times daily.    Historical Provider, MD  oxyCODONE (ROXICODONE) 15 MG immediate release tablet Take 1 tablet (15 mg total) by mouth every 4 (four) hours as needed. 11/10/16   Heath Lark, MD  predniSONE (DELTASONE) 10 MG tablet Take 20 mg (2 tablets) daily 05/20/16   Heath Lark, MD  traMADol (ULTRAM) 50 MG tablet Take 25 mg by mouth every 4 (four) hours as needed for pain.     Historical Provider, MD    Family History Family History  Problem Relation Age of Onset  . Brain cancer Mother   . Heart disease Father     open heart surgery  . Heart attack Father   . Breast cancer Sister   . Heart disease Brother     open heart surgery  .  Diabetes Brother   . Heart attack Brother   . Stroke Neg Hx     Social History Social History  Substance Use Topics  . Smoking status: Former Smoker    Types: Cigarettes    Quit date: 01/26/1970  . Smokeless tobacco: Never Used  . Alcohol use No     Allergies   Patient has no known allergies.   Review of Systems Review of Systems  Respiratory: Positive for cough and shortness of breath.   All other systems reviewed and are negative.    Physical Exam Updated Vital Signs BP 93/74 (BP Location: Right Arm)   Pulse (!) 144   Temp 98.7 F (37.1 C) (Axillary)   Resp (!) 36   Ht 5\' 7"  (1.702 m)   Wt 68 kg   SpO2 96%   BMI 23.49 kg/m   Physical Exam  Constitutional: He is oriented to person, place, and time. He appears well-developed and well-nourished.  HENT:  Head: Normocephalic and atraumatic.  Mouth/Throat: Oropharynx is clear and moist.  Eyes: Conjunctivae and EOM are normal. Pupils are equal, round, and reactive to light.  Neck: Normal range of motion.  Cardiovascular: Regular rhythm and normal heart sounds.  Tachycardia present.   Pulmonary/Chest: Effort normal  and breath sounds normal. He has no wheezes.  Increased work of breathing, conversational dyspnea noted, O2 sats 94% on RA; no overt wheezes or rhonchi; speaking in short phrases  Abdominal: Soft. Bowel sounds are normal.  Musculoskeletal: Normal range of motion.  No calf swelling or tenderness; no redness, no warmth to touch; DP pulses intact  Neurological: He is alert and oriented to person, place, and time.  Skin: Skin is warm and dry.  Psychiatric: He has a normal mood and affect.  Nursing note and vitals reviewed.    ED Treatments / Results  Labs (all labs ordered are listed, but only abnormal results are displayed) Labs Reviewed  CBC WITH DIFFERENTIAL/PLATELET - Abnormal; Notable for the following:       Result Value   WBC 15.2 (*)    RBC 3.16 (*)    Hemoglobin 9.1 (*)    HCT 26.4 (*)    RDW 17.5 (*)    Platelets 25 (*)    Neutro Abs 10.9 (*)    Lymphs Abs 0.6 (*)    Monocytes Absolute 3.2 (*)    All other components within normal limits  COMPREHENSIVE METABOLIC PANEL - Abnormal; Notable for the following:    Sodium 132 (*)    CO2 19 (*)    Glucose, Bld 115 (*)    BUN 49 (*)    Calcium 8.5 (*)    Albumin 3.4 (*)    AST 330 (*)    ALT 315 (*)    Total Bilirubin 3.1 (*)    All other components within normal limits  PROTIME-INR - Abnormal; Notable for the following:    Prothrombin Time 23.4 (*)    All other components within normal limits  APTT - Abnormal; Notable for the following:    aPTT 46 (*)    All other components within normal limits  URINALYSIS, ROUTINE W REFLEX MICROSCOPIC - Abnormal; Notable for the following:    Color, Urine AMBER (*)    Ketones, ur 5 (*)    All other components within normal limits  BRAIN NATRIURETIC PEPTIDE - Abnormal; Notable for the following:    B Natriuretic Peptide 1,220.7 (*)    All other components within normal limits  I-STAT TROPOININ,  ED - Abnormal; Notable for the following:    Troponin i, poc 9.18 (*)    All other  components within normal limits  I-STAT CG4 LACTIC ACID, ED - Abnormal; Notable for the following:    Lactic Acid, Venous 2.74 (*)    All other components within normal limits  I-STAT CG4 LACTIC ACID, ED - Abnormal; Notable for the following:    Lactic Acid, Venous 3.43 (*)    All other components within normal limits  CULTURE, BLOOD (ROUTINE X 2)  URINE CULTURE  PATHOLOGIST SMEAR REVIEW  INFLUENZA PANEL BY PCR (TYPE A & B, H1N1)  TROPONIN I  TROPONIN I  TROPONIN I  TYPE AND SCREEN    EKG  EKG Interpretation None       Radiology Dg Chest 2 View  Result Date: 11/10/2016 CLINICAL DATA:  Myelodysplastic syndrome with shortness of breath and cough. EXAM: CHEST  2 VIEW COMPARISON:  11/16/2015 FINDINGS: The cardio pericardial silhouette is enlarged. Symmetric, diffuse interstitial and central alveolar opacity suggests edema. No substantial pleural effusion. The visualized bony structures of the thorax are intact. Patient is status post CABG. IMPRESSION: Cardiomegaly with diffuse interstitial and central alveolar opacity suggests edema. Diffuse infection cannot be excluded. Electronically Signed   By: Misty Stanley M.D.   On: 11/10/2016 17:30    Procedures Procedures (including critical care time)  CRITICAL CARE Performed by: Larene Pickett   Total critical care time: 60 minutes  Critical care time was exclusive of separately billable procedures and treating other patients.  Critical care was necessary to treat or prevent imminent or life-threatening deterioration.  Critical care was time spent personally by me on the following activities: development of treatment plan with patient and/or surrogate as well as nursing, discussions with consultants, evaluation of patient's response to treatment, examination of patient, obtaining history from patient or surrogate, ordering and performing treatments and interventions, ordering and review of laboratory studies, ordering and review of  radiographic studies, pulse oximetry and re-evaluation of patient's condition.   Medications Ordered in ED Medications  piperacillin-tazobactam (ZOSYN) IVPB 3.375 g (not administered)  vancomycin (VANCOCIN) IVPB 750 mg/150 ml premix (not administered)  HYDROmorphone (DILAUDID) injection 1 mg (not administered)  furosemide (LASIX) injection 20 mg (not administered)  digoxin (LANOXIN) 0.25 MG/ML injection 0.25 mg (not administered)  sodium chloride 0.9 % bolus 1,000 mL (0 mLs Intravenous Stopped 11-17-16 1056)  piperacillin-tazobactam (ZOSYN) IVPB 3.375 g (0 g Intravenous Stopped 11/17/2016 1106)  vancomycin (VANCOCIN) IVPB 1000 mg/200 mL premix (0 mg Intravenous Stopped 11-17-2016 1159)  HYDROmorphone (DILAUDID) injection 1 mg (1 mg Intravenous Given 17-Nov-2016 1111)     Initial Impression / Assessment and Plan / ED Course  I have reviewed the triage vital signs and the nursing notes.  Pertinent labs & imaging results that were available during my care of the patient were reviewed by me and considered in my medical decision making (see chart for details).  Clinical Course    60 -year-old male with history of MDS currently undergoing palliative treatment, presenting to the ED for shortness of breath. Patient does have increased work of breathing and is conversationally dyspneic on exam. He is requiring 2 L supplemental oxygen currently.  He is tachycardic around 135.  BP soft in the [id number] systolic.  IVFB given, continue supplemental O2.  On chart review-- patient is to begin receiving palliative blood transfusions Q10 days to keep hcg > 8.0.  He was also started on augmentin and cipro 2 days ago  due to low grade fever.  Concern for possible sepsis given this but PE must be considered given his hemoptysis, SOB, and hx of CA. Work-up started.  Will monitor closely.  Patient with elevated lactic acid at 2,74, elevated trop at 9.18.  EKG appears to be AFIB.  Hx of this years ago around time of his CABG.   CXR with pulmonary edema, question infiltrate.  Continues to deny CP, only SOB.  Patient's is known to be anemic and thrombocytopenic, was taken off ASA because of this.  Do not feel heparin would be ideal for him in this situation.  Discussed with cardiology, Dr. Oval Linsey-- unfortunately nothing to offer patient at this time.  I had a length discussion with patient and wife about multiple medical issues at this time.  Patient confirms he is DNR-- does not want any mechanical ventilation or CPR.  Feels IVF, pressors, antibiotics, blood are all acceptable.  Remainder of labs resulted--  LFT's are also elevated-- denies abdominal pain, nausea, vomiting, or diarrhea.  Platelet count remains low at 25, hcg 9.1.  WBC count 15.  Type and screen in place.  Patient given judicious IVF given his hypotension with new fluid overload.  He was started on vanc/zosyn empirically.    Discussed with hospitalist, Dr. Tana Coast-- will admit to step-down.  Temp admit orders placed.  Final Clinical Impressions(s) / ED Diagnoses   Final diagnoses:  SOB (shortness of breath)  Hypervolemia, unspecified hypervolemia type  Elevated lactic acid level  Elevated troponin  Atrial fibrillation, unspecified type [facility])  NSTEMI (non-ST elevated myocardial infarction) [facility])    New Prescriptions New Prescriptions   No medications on file     Larene Pickett, PA-C Dec 10, 2016 1335    Virgel Manifold, MD 11/14/16 1531

## 2016-12-06 NOTE — Progress Notes (Signed)
Patient went ECG showed asystole on the monitor. Heart beat and respirations were auscultated for one full minute with no sounds auscultated. He was pronounced by me and Jennye Moccasin, RN at 2114. His wife and the rest of his family were at the bedside, emotional support provided.

## 2016-12-06 NOTE — ED Notes (Signed)
Bed: [id number] Expected date:  Expected time:  Means of arrival:  Comments: 71 yo SOB, hypertension, CA pt

## 2016-12-06 NOTE — Plan of Care (Signed)
Discussed with Dr. Oletta Darter, critical care on [place]. Patient is now intubated after cardiac arrest. PCCM will assume care, Dr. Corrie Dandy will be attending physician. TRH service will sign off.   Aariv Medlock M.D. [facility] December 09, 2016, 4:43 PM  Pager: [phone]

## 2016-12-06 NOTE — Discharge Summary (Signed)
Expiration Note/ Death Summary  Eric [name]  MR#: [id number]  DOB:03-08-1946  Date of Admission: December 04, 2016 Date of Death: 2016-12-04  Attending Physician: At the time of death, attending physicians : PCCM, Dr Larkin Ina  Patient's PCP: Eric Coma, MD  Consults:  PCCM Cardiology Palliative medicine   Cause of Death: NSTEMI (non-ST elevated myocardial infarction) ([place])  Secondary Diagnoses  . acute hypoxemic hypercapnic respiratory failure   Pulmonary edema . Sepsis with healthcare associated pneumonia ([place]) . NSTEMI (non-ST elevated myocardial infarction) ([place])   Status post V. tach/V. fib arrest . Refractory MDS (myelodysplastic syndrome), low grade ([place]) . Atrial fibrillation with RVR ([place]) . Pancytopenia, acquired ([place]) . 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 Patient was just recently seen by Dr. Alvy Bimler on 11/10/16, received 2 units of RBC transfusion. Patient was having fevers cough and chills at that time and was given the prescription off Augmentin and ciprofloxacin. Chest x-ray had shown infiltrates. Patient reported that on the morning of admission that he woke up at 4 AM with substernal chest pain, 10 out of 10, sharp, stabbing associated with shortness of breath, palpitations and diaphoresis. No further radiation, nausea or vomiting. He was having cough with greenish to yellowish phlegm, fevers and chills in the last 3-4 days. He did have some hemoptysis with coughing a day before and fever of 102F.  ED work-up/course:  Temp 98.9, RR 33, initially HR 138, atrial fib with RVR  BP 106/74 , O2  sats 96% on 2 L EKG rate 140, T-wave inversions in 2, 3 aVF and lateral leads,  Hospital Course: NSTEMI (non-ST elevated myocardial infarction) ([place]), chest pain -Initial Troponin was 9.18 with EKG changes T-wave inversions in 2, 3, aVF and lateral leads with BNP 1220.7 and atrial fibrillation with RVR, acute CHF. Chest pain had resolved at the time of admission. It was a difficult situation with platelets 25K, unable to place on antiplatelet agents, BP borderline. Cardiology was consulted for further recommendations. No heparin drip secondary to profound thrombocytopenia. This was discussed in detail with the patient and wife at the bedside, they understood overall guarded prognosis and had opted to be DO NOT RESUSCITATE status.      Atrial fibrillation with RVR (HCC) and acute CHF - Patient was also found to be in atrial fibrillation and acute CHF likely precipitate her due to rapid ventricular response. Unfortunately again difficult situation, could not be placed on IV Lasix due to borderline BP. Patient was admitted to stepdown unit. Cardiology was consulted, discussed with Dr . Oval Linsey. While in the stepdown unit, patient developed V. fib arrest after standing to use the bathroom. The patient is family at this time to rescinded the code status and decided for full code status. Patient was intubated, placed on pressors, underwent ACLS CPR with ROSC in 11mins. Patient's family was also updated by cardiology and palliative medicine regarding the current condition and the plan was to abstain from performing chest compression and defibrillation if he had another cardiac arrest. Patient was placed on Levophed pressor support and amiodarone, ventilation. PCCM was also consulted. Per PCCM notes, Dr Larkin Ina, PCCM also extensively  discussed the plan of care with patient's wife, 2 sons and daughter-in-law and overall poor prognosis. At this point, the family decided to make him a full DNR, with no escalation of  care, no additional pressors and full comfort care the next day. Patient expired on November 15, 2016, 2110.      MDS (myelodysplastic syndrome), low grade ([place]), pancytopenia with thrombocytopenia, refractory - Seen by Dr. Alvy Bimler on 12/6, had received 2 units packed RBC transfusion    Sepsis ([place]) with HCAP, azotemia - Patient was placed on broad-spectrum antibiotics. Blood cultures negative, strep antigen remained negative   Signed:  RAI,RIPUDEEP M.D. [facility] 11/26/2016, 9:49 PM Pager: [phone]

## 2016-12-06 NOTE — ED Notes (Signed)
Patient requesting pain medication. Sent Dr Tana Coast message in [place].

## 2016-12-06 NOTE — Consult Note (Signed)
CARDIOLOGY CONSULT NOTE   Patient ID: Eric [name] MRN: [id number] DOB/AGE: [birth year] 71 y.o.  Admit date: 11/26/2016  Primary Physician   Lilian Coma, MD Primary Cardiologist   Dr. Irish Lack Reason for Consultation   Afib, NSTEMI and CHF Requesting Physician  Dr. Tana Coast  HPI: Eric [name] is a 71 y.o. male with a history of CAD s/p CABG 01/2015, HTN, RA, myelodysplastic syndrome, pancytopenia with thrombocytopenia followed by Dr. Alvy Bimler who presented with with few days history of cough, congestion, fever. This morning patient woke up around 4 AM with substernal chest pressure. Described as "someone walking ". 9 out of 10 in character. Did not improve with sublingual nitroglycerin. Also noted shortness of breath with palpitations leading to ER presentation. He also had a hemoptysis this morning.  Upon presentation EKG showed A. fib at a rate of 140 bpm with T-wave inversion in inferior lateral lead. Which is somewhat new compared to EKG of 11/09/16. could be rate related. Point-of-care troponin 9.18. BNP greater than 1200. Lactic acid elevated at 3.43. Hemoglobin 9.1 with WBC of 15.2. Platelets 25. Chest x-ray with improved pulmonary edema. Empirically treatment with abx for HCAP with sepsis. His chest pain is different from prior cardiac pain when he had a CABG.  He was seen by Dr. Alvy Bimler  11/10/16 and transfuse 2 units of blood. Seen by Dr. Irish Lack 11/09/16. No chest pain at that time. help prolong life of the bypass grafts, aspirin would be beneficial.     Past Medical History:  Diagnosis Date  . Anemia   . CAD (coronary artery disease), native coronary artery 01/09/2015   3 v dz, EF 50%  . Dysrhythmia   . Family history of ischemic heart disease   . Gout   . Headache 03/07/2014  . Hypertension   . MDS (myelodysplastic syndrome), low grade ([place]) 2012   on observation; MDS FISH panel on 04/26/2013 was normal; cytogenetics were also negative.   . Muscle spasm of back 04/05/2016   . Needs flu shot 08/23/2013  . Rheumatoid arthritis(714.0)    was on MTX until MDS dx  . Shortness of breath    exertional  . Tachycardia 2013   evaluated by Dr Irish Lack @ [facility]     Past Surgical History:  Procedure Laterality Date  . CIRCUMCISION    . CORONARY ARTERY BYPASS GRAFT N/A 01/13/2015   Procedure: CORONARY ARTERY BYPASS GRAFTING (CABG) times four, using left internal mammary artery and left greater saphenous vein.;  Surgeon: Ivin Poot, MD;  Location: [place];  Service: Open Heart Surgery;  Laterality: N/A;  . HERNIA REPAIR    . KNEE ARTHROSCOPY Left   . LEFT HEART CATHETERIZATION WITH CORONARY ANGIOGRAM N/A 01/09/2015   Procedure: LEFT HEART CATHETERIZATION WITH CORONARY ANGIOGRAM;  Surgeon: Burnell Blanks, MD;  LAD 99/90%, D1 50%, CFX 40%, OM 70%, OM branch 80%, RCA  50%, dRCA 99%, PDA 50%, EF 50%  . TEE WITHOUT CARDIOVERSION N/A 01/13/2015   Procedure: TRANSESOPHAGEAL ECHOCARDIOGRAM (TEE);  Surgeon: Ivin Poot, MD;  Location: [place];  Service: Open Heart Surgery;  Laterality: N/A;  . TOTAL KNEE ARTHROPLASTY Right 02/02/2013   Procedure: TOTAL KNEE ARTHROPLASTY;  Surgeon: Yvette Rack., MD;  Location: [place];  Service: Orthopedics;  Laterality: Right;  RIGHT ARTHROPLASTY KNEE MEDIAL/LATERAL COMPARTMENTS WITH PATELLA RESURFACING   . UMBILICAL HERNIA REPAIR      No Known Allergies  I have reviewed the patient's current medications . allopurinol  50 mg  Oral Daily  . ceFEPime (MAXIPIME) IV  1 g Intravenous Q8H  . digoxin  0.25 mg Intravenous Once  . furosemide  20 mg Intravenous Once  . predniSONE  5 mg Oral BID WC  . vancomycin  750 mg Intravenous Q12H    ALPRAZolam, guaiFENesin-codeine, HYDROmorphone (DILAUDID) injection, oxyCODONE  Prior to Admission medications   Medication Sig Start Date End Date Taking? Authorizing Provider  acetaminophen (TYLENOL) 500 MG tablet Take 1,000 mg by mouth every 6 (six) hours as needed for pain. Reported on 06/21/2016   Yes  Historical Provider, MD  allopurinol (ZYLOPRIM) 100 MG tablet Take 50 mg by mouth daily.  04/14/13  Yes Historical Provider, MD  ALPRAZolam Duanne Moron) 0.5 MG tablet Take 0.5-1 mg by mouth 4 (four) times daily as needed for anxiety or sleep. Take .5mg -1mg  by mouth three times daily and 1mg  at bedtime as needed 10/08/16  Yes Historical Provider, MD  amoxicillin-clavulanate (AUGMENTIN) 875-125 MG tablet Take 1 tablet by mouth 2 (two) times daily. 11/10/16  Yes Heath Lark, MD  Calcium-Vitamin D (CALTRATE 600 PLUS-VIT D PO) Take 1 tablet by mouth 2 (two) times daily.    Yes Historical Provider, MD  ciprofloxacin (CIPRO) 250 MG tablet Take 1 tablet (250 mg total) by mouth 2 (two) times daily. 11/10/16  Yes Heath Lark, MD  guaiFENesin-codeine (ROBITUSSIN AC) 100-10 MG/5ML syrup Take 5 mLs by mouth 3 (three) times daily as needed for cough. 11/10/16  Yes Heath Lark, MD  lisinopril-hydrochlorothiazide (PRINZIDE,ZESTORETIC) 10-12.5 MG tablet take 1/2 tablet by mouth once daily 11/08/16  Yes Jettie Booze, MD  loratadine (CLARITIN) 10 MG tablet Take 10 mg by mouth daily.   Yes Historical Provider, MD  metoprolol (LOPRESSOR) 50 MG tablet take 1/2 tablet by mouth twice a day 10/22/16  Yes Jettie Booze, MD  MULTIPLE VITAMINS-MINERALS PO Take 1 tablet by mouth 2 (two) times daily. Reported on 06/21/2016   Yes Historical Provider, MD  NITROSTAT 0.4 MG SL tablet Place 0.4 mg under the tongue every 5 (five) minutes as needed. Reported on 06/21/2016 12/31/14  Yes Historical Provider, MD  Omega-3 Fatty Acids (FISH OIL) 1000 MG CAPS Take 2,000 mg by mouth 2 (two) times daily.   Yes Historical Provider, MD  oxyCODONE (ROXICODONE) 15 MG immediate release tablet Take 1 tablet (15 mg total) by mouth every 4 (four) hours as needed. 11/10/16  Yes Heath Lark, MD  predniSONE (DELTASONE) 5 MG tablet Take 5 mg by mouth 2 (two) times daily. 10/21/16  Yes Historical Provider, MD  traMADol (ULTRAM) 50 MG tablet Take 25 mg by mouth  every 4 (four) hours as needed for pain.    Yes Historical Provider, MD  cyclobenzaprine (FLEXERIL) 10 MG tablet Take 1 tablet (10 mg total) by mouth 3 (three) times daily as needed for muscle spasms. 05/20/16   Heath Lark, MD     Social History   Social History  . Marital status: Married    Spouse name: N/A  . Number of children: N/A  . Years of education: N/A   Occupational History  . Not on file.   Social History Main Topics  . Smoking status: Former Smoker    Types: Cigarettes    Quit date: 01/26/1970  . Smokeless tobacco: Never Used  . Alcohol use No  . Drug use: No  . Sexual activity: Not on file   Other Topics Concern  . Not on file   Social History Narrative  . No narrative on file  Family Status  Relation Status  . Mother Deceased  . Father Deceased  . Sister Alive  . Brother Alive  . Brother   . Maternal Grandmother Deceased  . Maternal Grandfather Deceased  . Paternal Grandmother Deceased  . Paternal Grandfather Deceased  . Neg Hx    Family History  Problem Relation Age of Onset  . Brain cancer Mother   . Heart disease Father     open heart surgery  . Heart attack Father   . Breast cancer Sister   . Heart disease Brother     open heart surgery  . Diabetes Brother   . Heart attack Brother   . Stroke Neg Hx       ROS:  Full 14 point review of systems complete and found to be negative unless listed above.  Physical Exam: Blood pressure 106/65, pulse 98, temperature 98.9 F (37.2 C), temperature source Rectal, resp. rate (!) 29, height 5\' 7"  (1.702 m), weight 150 lb (68 kg), SpO2 100 %.  General: ill appering, male mild no acute distress on supplemental oxygen Head: Eyes PERRLA, No xanthomas. Normocephalic and atraumatic, oropharynx without edema or exudate.  Lungs: Resp regular and unlabored, diminised breath sound with bibasilar Rales Heart: RR tachycardia no s3, s4, or murmurs..   Neck: No carotid bruits. No lymphadenopathy.  +  JVD. Abdomen: Bowel sounds present, distended with ttp Msk:  No spine or cva tenderness. No weakness, no joint deformities or effusions. Extremities: No clubbing, cyanosis. 2+ BL  edema. DP/PT/Radials 2+ and equal bilaterally. Neuro: Alert and oriented X 3. No focal deficits noted. Psych:  Good affect, responds appropriately Skin: No rashes or lesions noted.  Labs:   Lab Results  Component Value Date   WBC 15.2 (H) 12-09-16   HGB 9.1 (L) 12-09-2016   HCT 26.4 (L) 2016/12/09   MCV 83.5 12-09-16   PLT 25 (LL) 12-09-2016    Recent Labs  12/09/2016 0839  INR 2.05    Recent Labs Lab 12-09-2016 0839  NA 132*  K 4.3  CL 101  CO2 19*  BUN 49*  CREATININE 0.88  CALCIUM 8.5*  PROT 6.8  BILITOT 3.1*  ALKPHOS 88  ALT 315*  AST 330*  GLUCOSE 115*  ALBUMIN 3.4*   Magnesium  Date Value Ref Range Status  01/14/2015 2.3 1.5 - 2.5 mg/dL Final   No results for input(s): CKTOTAL, CKMB, TROPONINI in the last 72 hours.  Recent Labs  2016-12-09 0904  TROPIPOC 9.18*   No results found for: PROBNP Lab Results  Component Value Date   CHOL 66 04/21/2015   HDL 25.00 (L) 04/21/2015   LDLCALC 20 04/21/2015   TRIG 104.0 04/21/2015   No results found for: DDIMER Lipase  Date/Time Value Ref Range Status  06/03/2015 09:36 AM 15 (L) 22 - 51 U/L Final   TSH  Date/Time Value Ref Range Status  02/26/2011 10:30 AM 1.030 0.350 - 4.500 uIU/mL Final   Vitamin B-12  Date/Time Value Ref Range Status  01/10/2015 12:09 PM 560 211 - 911 pg/mL Final    Comment:    Performed at [facility]   Folate  Date/Time Value Ref Range Status  01/10/2015 12:09 PM >20.0 ng/mL Final    Comment:    (NOTE) Reference Ranges        Deficient:       0.4 - 3.3 ng/mL        Indeterminate:   3.4 - 5.4 ng/mL  Normal:              > 5.4 ng/mL Performed at [facility]    Ferritin  Date/Time Value Ref Range Status  01/10/2015 12:09 PM 405 (H) 22 - 322 ng/mL Final    Comment:     Performed at [facility]  05/30/2014 11:36 AM 337 (H) 22 - 316 ng/ml Final   TIBC  Date/Time Value Ref Range Status  01/10/2015 12:09 PM 199 (L) 215 - 435 ug/dL Final  05/30/2014 11:36 AM 198 (L) 202 - 409 ug/dL Final   Iron  Date/Time Value Ref Range Status  01/10/2015 12:09 PM 68 42 - 165 ug/dL Final  05/30/2014 11:36 AM 87 42 - 163 ug/dL Final   Retic %  Date/Time Value Ref Range Status  03/12/2016 08:45 AM 1.04 0.80 - 1.80 % Final    Echo: 01/2015 LV EF: 65% -  70%  ------------------------------------------------------------------- Indications:   Chest pain 786.51.  ------------------------------------------------------------------- History:  Risk factors: SVT. Hypertension.  ------------------------------------------------------------------- Study Conclusions  - Left ventricle: The cavity size was normal. Wall thickness was normal. Systolic function was vigorous. The estimated ejection fraction was in the range of 65% to 70%. Wall motion was normal; there were no regional wall motion abnormalities. Doppler parameters are consistent with abnormal left ventricular relaxation (grade 1 diastolic dysfunction). - Left atrium: The atrium was moderately dilated. - Pulmonary arteries: Systolic pressure was mildly increased. PA peak pressure: 40 mm Hg (S).  Impressions:  - Normal LV function; grade 1 diastolic dysfunction; moderate LAE; trace TR; mildly elevated pulmonary pressure.  Cath 01/2015 Hemodynamic Findings: Central aortic pressure: 119/53 Left ventricular pressure: 115/4/17  Angiographic Findings:  Left main: 20% ostial stenosis.   Left Anterior Descending Artery: Large caliber vessel that does not reach the apex. The LAD gives off a large diagonal branch. In the mid LAD there is a 99% stenosis followed by a 90% stenosis. The distal vessel is moderate in caliber. The diagonal branch is a large caliber vessel with diffuse  proximal 50% stenosis.   Circumflex Artery: Large caliber vessel with 40% proximal stenosis. There is a large caliber obtuse marginal branch with proximal 60-70% stenosis. The obtuse marginal branch bifurcates distally. The superior branch has proximal 80% stenosis.   Right Coronary Artery: Large dominant vessel with diffuse proximal and mid calcification. The mid vessel has serial 50% stenoses. The distal vessel has a 99% stenosis just before the bifurcation into the large caliber posterolateral branch and the large caliber PDA. The PDA has proximal 50% stenosis. The Posterolateral branch has mild diffuse plaque.   Left Ventricular Angiogram: LVEF=50%  Impression: 1. Severe triple vessel CAD 2. Low normal systolic function 3. Unstable angina  Recommendations: He has severe three vessel CAD. He has myelodysplastic syndrome and is not a good candidate for long term anti-platelet therapy. PCI would require multi-vessel stenting and need for long term anti-platelet therapy. Will consult CT surgery for CABG.        Complications:  None. The patient tolerated the procedure well   CORONARY ARTERY BYPASS GRAFTING  01/13/15 (CABG) times four, using left internal mammary artery and left greater saphenous vein. LIMA-LAD; SVG-OM; SVG-PD; SVG-DIAG  Radiology:  Dg Chest 2 View  Result Date: 11/10/2016 CLINICAL DATA:  Myelodysplastic syndrome with shortness of breath and cough. EXAM: CHEST  2 VIEW COMPARISON:  11/16/2015 FINDINGS: The cardio pericardial silhouette is enlarged. Symmetric, diffuse interstitial and central alveolar opacity suggests edema. No substantial pleural effusion. The visualized  bony structures of the thorax are intact. Patient is status post CABG. IMPRESSION: Cardiomegaly with diffuse interstitial and central alveolar opacity suggests edema. Diffuse infection cannot be excluded. Electronically Signed   By: Misty Stanley M.D.   On: 11/10/2016 17:30   Dg Chest Port 1 View  Result  Date: November 28, 2016 CLINICAL DATA:  Increased shortness of breath and difficulty breathing. EXAM: PORTABLE CHEST 1 VIEW COMPARISON:  11/10/2016 FINDINGS: Again noted are bilateral lung densities, right side greater than left. Pattern is most suggestive for pulmonary edema with mildly confluent areas of airspace disease in the right central lung. The linear densities or Kerley B-lines noted on the previous examination are less conspicuous on this examination. Heart size remains upper limits of normal. Evidence of prior CABG. Atherosclerotic calcifications at the aortic arch. IMPRESSION: Asymmetric lung disease is suggestive for pulmonary edema. Overall, the pulmonary edema has slightly decreased from 11/10/2016 but cannot exclude minimal progression in the central aspect of the right lung. Cannot exclude a secondly infectious process. Electronically Signed   By: Markus Daft M.D.   On: 2016/11/28 08:45    ASSESSMENT AND PLAN:     1. NSTEMI (non-ST elevated myocardial infarction) ([place]) s/p CABG x 4 (LIMA-LAD; SVG-OM; SVG-PD; SVG-DIAG) 01/2015 - This pain is different from prior cardiac pain. Unresponsive to sublingual nitroglycerin. Point-of-care troponin 9.8. EKG with questionable T-wave inversion in inferior lateral lead with elevated rate. Currently chest pain-free. Could be related to pneumonia. - Continue cycle troponin. Not a candidate for cardiac catheterization given low platelets and frequently requiring blood transfusion. Unable to add antianginal therapy due to low blood pressure currently. No a candidate for IV heparin as above. Consider palliative care. MD to see later today. BP improves consider adding Imdur.    2. AFib with RVR - Last episode occurred during CABG. He was in A. fib on presentation and then spontaneously converted to sinus rhythm currently is sinus tachycardia at rate of 140s. - CHADSVASCS score of 4. Not a candidate of anticoagulation. Hypotension limiting rate control strategy. -  Digoxin added for rate control. Pending echocardiogram. May need to consider discontinuation if diastolic dysfunction, pending echocardiogram.  3. Acute CHF - BNP > 1200. Likely rate related. Last echo showed normal EF with grade 1 diastolic dysfunction. Pending echocardiogram this admission. As above. Continue low-dose Lasix. Uptitrate as blood pressure allows.  4. MDS (myelodysplastic syndrome), low grade ([place]) with  Pancytopenia and Thrombocytopenia ([place]) - per primary    Signed: Arvada Seaborn, PA 11/28/2016, 3:25 PM Pager [phone]  Co-Sign MD

## 2016-12-06 NOTE — ED Provider Notes (Addendum)
[facility]  Department of Emergency Medicine   Code Blue CONSULT NOTE  Chief Complaint: Cardiac arrest/unresponsive   Level V Caveat: Unresponsive  History of present illness: I was contacted by the hospital for a CODE BLUE cardiac arrest upstairs and presented to the patient's bedside. CODE BLUE occurred around 4 pm on Nov 30, 2016.  Pt was admitted to the hospital with elevated troponin, possible Pneumonia. He has multiple medical comorbidities and multiple active problems which all are very serious. Pt had collapsed in front off family per nursing report and CPR was started immediately. Code was changed from DNR/DNI to full code by family after the arrest.  ROS: Unable to obtain, Level V caveat  . Past Medical History:  Diagnosis Date  . Anemia   . CAD (coronary artery disease), native coronary artery 01/09/2015   3 v dz, EF 50%  . Dysrhythmia   . Family history of ischemic heart disease   . Gout   . Headache 03/07/2014  . Hypertension   . MDS (myelodysplastic syndrome), low grade ([place]) 2012   on observation; MDS FISH panel on 04/26/2013 was normal; cytogenetics were also negative.   . Muscle spasm of back 04/05/2016  . Needs flu shot 08/23/2013  . Rheumatoid arthritis(714.0)    was on MTX until MDS dx  . Shortness of breath    exertional  . Tachycardia 2013   evaluated by Dr Irish Lack @ [facility]   Past Surgical History:  Procedure Laterality Date  . CIRCUMCISION    . CORONARY ARTERY BYPASS GRAFT N/A 01/13/2015   Procedure: CORONARY ARTERY BYPASS GRAFTING (CABG) times four, using left internal mammary artery and left greater saphenous vein.;  Surgeon: Ivin Poot, MD;  Location: [place];  Service: Open Heart Surgery;  Laterality: N/A;  . HERNIA REPAIR    . KNEE ARTHROSCOPY Left   . LEFT HEART CATHETERIZATION WITH CORONARY ANGIOGRAM N/A 01/09/2015   Procedure: LEFT HEART CATHETERIZATION WITH CORONARY ANGIOGRAM;  Surgeon: Burnell Blanks, MD;  LAD 99/90%, D1  50%, CFX 40%, OM 70%, OM branch 80%, RCA  50%, dRCA 99%, PDA 50%, EF 50%  . TEE WITHOUT CARDIOVERSION N/A 01/13/2015   Procedure: TRANSESOPHAGEAL ECHOCARDIOGRAM (TEE);  Surgeon: Ivin Poot, MD;  Location: [place];  Service: Open Heart Surgery;  Laterality: N/A;  . TOTAL KNEE ARTHROPLASTY Right 02/02/2013   Procedure: TOTAL KNEE ARTHROPLASTY;  Surgeon: Yvette Rack., MD;  Location: [place];  Service: Orthopedics;  Laterality: Right;  RIGHT ARTHROPLASTY KNEE MEDIAL/LATERAL COMPARTMENTS WITH PATELLA RESURFACING   . UMBILICAL HERNIA REPAIR     Social History   Social History  . Marital status: Married    Spouse name: N/A  . Number of children: N/A  . Years of education: N/A   Occupational History  . Not on file.   Social History Main Topics  . Smoking status: Former Smoker    Types: Cigarettes    Quit date: 01/26/1970  . Smokeless tobacco: Never Used  . Alcohol use No  . Drug use: No  . Sexual activity: Not on file   Other Topics Concern  . Not on file   Social History Narrative  . No narrative on file   No Known Allergies  Last set of Vital Signs (not current) Vitals:   11-30-16 2045 2016/11/30 2100  BP: (!) 103/41 (!) 32/12  Pulse:    Resp: (!) 38 (!) 37  Temp:        Physical Exam Gen: unresponsive Cardiovascular:  pulseless  Resp: apneic. Breath sounds equal bilaterally with bagging  Abd: nondistended  Neuro: GCS 3, unresponsive to pain  HEENT: No blood in posterior pharynx, gag reflex absent  Neck: No crepitus  Musculoskeletal: No deformity  Skin: warm  Procedures  CRITICAL CARE Performed by: Varney Biles Total critical care time: 30 minutes Critical care time was exclusive of separately billable procedures and treating other patients. Critical care was necessary to treat or prevent imminent or life-threatening deterioration. Critical care was time spent personally by me on the following activities: development of treatment plan with patient and/or  surrogate as well as nursing, discussions with consultants, evaluation of patient's response to treatment, examination of patient, obtaining history from patient or surrogate, ordering and performing treatments and interventions, ordering and review of laboratory studies, ordering and review of radiographic studies, pulse oximetry and re-evaluation of patient's condition.  Cardiopulmonary Resuscitation (CPR) Procedure Note Directed/Performed by: Varney Biles I personally directed ancillary staff and/or performed CPR in an effort to regain return of spontaneous circulation and to maintain cardiac, neuro and systemic perfusion.    Medical Decision making   Pt was intubated by Anesthesia team.  We started ACLS with chest compression and epinephrine. We had ROSC. EKG showed ST depressions which were new. No STEMI criteria. Discussed case with Cardiology team and they had already deemed that patient was not fit for catheterization. Pt had platelet count of 20K at admission. Etiology of arrest could be brain bleed. PT also had elevated troponin. Etiology could be massive PE, massive MI. Pt was not suitable for lytic therapy in this setting.  Pt then went into a wide complex tachycardia, with preserved pulse. While we were discussing defibrillation, pt spontaneously went into sinus rhythm. Pt then went into pulseless arrest for the 2nd time and we had ROSC after 1 round of CPR. Amiodarone 300 mg was given.  I had advised basic lab order and norepi drip - which medicine team had ordered.   Assessment and Plan   Cardiac Arrest - with ROSC.  Medicine team, Cardiology team and Palliative team to update family with the current condition of the patient and the prognosis. I have advised CT scan of the head, CXR and admission to ICU based on the decision made by the family on goals of care.     Varney Biles, MD 11/13/16 [place], MD 11/13/16 [id number]

## 2016-12-06 NOTE — Progress Notes (Signed)
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  Lactic acid was elevated on admission.  To start vancomycin and zosyn for suspected sepsis.   Plan: - zosyn 3.375 gm IV x1 over 30 min, then 3.375 gm IV q8h (infuse over 4 hrs) - vancomycin 1000 mg IV x1, then 750 mg IV q12h  _____________________________  Height: 5\' 7"  (170.2 cm) Weight: 150 lb (68 kg) IBW/kg (Calculated) : 66.1  Temp (24hrs), Avg:98.8 F (37.1 C), Min:98.7 F (37.1 C), Max:98.9 F (37.2 C)   Recent Labs Lab 11/10/16 0850 Dec 10, 2016 0839 Dec 10, 2016 0852  WBC 12.4*  --   --   CREATININE  --  0.88  --   LATICACIDVEN  --   --  2.74*    Estimated Creatinine Clearance: 73 mL/min (by C-G formula based on SCr of 0.88 mg/dL).    No Known Allergies   Thank you for allowing pharmacy to be a part of this patient's care.  Lynelle Doctor 2016-12-10 9:28 AM

## 2016-12-06 NOTE — Progress Notes (Signed)
Discussed with patient's wife and son ,with the presence of palliative medicine  Dr.  Domingo Cocking , about goals of care and CODE STATUS , at this point they do not wish for any more CPR if patient in cardiac arrest , but they wished to continue and other medical measures including ventilation, pressors and medical management , CODE STATUS was changed to limited code . Phillips Climes MD

## 2016-12-06 NOTE — Progress Notes (Signed)
  Echocardiogram 2D Echocardiogram has not been performed. I went to patient's room to perform the echocardiogram, the patient was sitting in the recliner. The secretary notified the nurse that the patient needed to be put in bed so I could perform the echocardiogram. The patient coded before the nurse came to put him in bed.  Gedalia Mcmillon 12-10-2016, 4:10 PM

## 2016-12-06 NOTE — Anesthesia Procedure Notes (Signed)
Procedure Name: Intubation Date/Time: 12-02-16 4:07 PM Performed by: Carleene Cooper A Pre-anesthesia Checklist: Patient identified, Emergency Drugs available, Suction available, Patient being monitored and Timeout performed Patient Re-evaluated:Patient Re-evaluated prior to inductionOxygen Delivery Method: Ambu bag Preoxygenation: Pre-oxygenation with 100% oxygen Intubation Type: IV induction Ventilation: Mask ventilation without difficulty Laryngoscope Size: Mac and 4 Grade View: Grade II Tube type: Subglottic suction tube Tube size: 7.5 mm Number of attempts: 2 Airway Equipment and Method: Stylet Placement Confirmation: ETT inserted through vocal cords under direct vision,  CO2 detector and breath sounds checked- equal and bilateral Secured at: 23 cm Tube secured with: Tape Dental Injury: Teeth and Oropharynx as per pre-operative assessment  Comments: DL X 2 with MAC 4. Grade 2 view. Blood noted in the airway prior to induction. ATOI. BBS=. + color change on the CO detector. ETT secured by RT. PCXR pending.

## 2016-12-06 NOTE — Progress Notes (Signed)
   2016-12-05 1600  Clinical Encounter Type  Visited With Family  Visit Type Initial;Psychological support;Spiritual support;Code;Critical Care  Referral From Nurse  Consult/Referral To Chaplain  Spiritual Encounters  Spiritual Needs Emotional;Other (Comment);Prayer;Grief support Special educational needs teacher)  Stress Factors  Patient Stress Factors Not reviewed  Family Stress Factors Health changes;Major life changes   I visited with the patient's wife, son and daughter-in-law in the ICU consult room. 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 She stated that she felt like, "it was her fault" that he coded, because she was helping him go to the restroom.  The patient's wife has good support from the son and daughter-in-law and they have another son coming.  The family had spoken with the patient about his goals of care before this event happened. They were in agreement with the doctors and understood what the doctor's prognosis was for the patient.  The patient's wife stated that she is strong in her faith and that she wants "God's will to be done." She stated that she wants to keep the patient alive and keep fighting, but that she knows it isn't the right thing to do and made the decision to not put the patient through anymore.  The patient's wife requested prayer.  I provided grief pastoral support and a prayer shawl for the patient's wife.   Please, contact Spiritual Care for further assistance.   [place] M.Div.

## 2016-12-06 NOTE — H&P (Signed)
History and Physical        Hospital Admission Note Date: November 17, 2016  Patient name: Eric [name] Medical record number: [id number] Date of birth: [birth year] Age: 71 y.o. Gender: male  PCP: Lilian Coma, MD   Referring physician: Quincy Carnes, PA-C   Patient coming from: home    Chief Complaint:  Chest pain with shortness of breath today  HPI: Patient is a 71 year old male with history of CAD, gout, hypertension, rheumatoid arthritis, primary myelofibrosis presented to ED with chest pain and shortness of breath. Patient was just recently seen by Dr. Alvy Bimler on 11/10/16, received 2 units of RBC transfusion. Patient was having fevers cough and chills at that time and was given the prescription off Augmentin and ciprofloxacin. Chest x-ray had shown infiltrates. Patient reported that this morning he woke up at 4 AM with substernal chest pain, 10 out of 10, sharp, stabbing associated with shortness of breath, palpitations and diaphoresis. No further radiation, nausea or vomiting. He has been having cough with greenish to yellowish phlegm, fevers and chills in the last 3-4 days. He did have some hemoptysis with coughing since yesterday and fever of 102F.  At the time of my encounter, chest pain has resolved and patient was eating breakfast. ED work-up/course:  Temp 98.9, RR 33, initially HR 138, atrial fib with RVR  BP 106/74 , O2 sats 96% on 2 L EKG rate 140, T-wave inversions in 2, 3 aVF and lateral leads,  Review of Systems: Positives marked in 'bold' Constitutional: +fever, chills, diaphoresis, poor appetite and fatigue.  HEENT: Denies photophobia, eye pain, redness, hearing loss, ear pain, congestion, sore throat, rhinorrhea, sneezing, mouth sores, trouble swallowing, neck pain, neck stiffness and tinnitus.   Respiratory: Please see history of present  illness Cardiovascular: Denies please see history of present illness Gastrointestinal: Denies nausea, vomiting, abdominal pain, diarrhea, constipation, blood in stool and abdominal distention.  Genitourinary: Denies dysuria, urgency, frequency, hematuria, flank pain and difficulty urinating.  Musculoskeletal: Denies myalgias, back pain, joint swelling, arthralgias and gait problem.  Skin: Denies pallor, rash and wound.  Neurological: Denies dizziness, seizures, syncope, weakness, light-headedness, numbness and headaches.  Hematological: Denies adenopathy. Easy bruising, personal or family bleeding history  Psychiatric/Behavioral: Denies suicidal ideation, mood changes, confusion, nervousness, sleep disturbance and agitation  Past Medical History: Past Medical History:  Diagnosis Date  . Anemia   . CAD (coronary artery disease), native coronary artery 01/09/2015   3 v dz, EF 50%  . Dysrhythmia   . Family history of ischemic heart disease   . Gout   . Headache 03/07/2014  . Hypertension   . MDS (myelodysplastic syndrome), low grade ([place]) 2012   on observation; MDS FISH panel on 04/26/2013 was normal; cytogenetics were also negative.   . Muscle spasm of back 04/05/2016  . Needs flu shot 08/23/2013  . Rheumatoid arthritis(714.0)    was on MTX until MDS dx  . Shortness of breath    exertional  . Tachycardia 2013   evaluated by Dr Irish Lack @ [facility]    Past Surgical History:  Procedure Laterality Date  . CIRCUMCISION    . CORONARY ARTERY BYPASS GRAFT N/A 01/13/2015   Procedure: CORONARY  ARTERY BYPASS GRAFTING (CABG) times four, using left internal mammary artery and left greater saphenous vein.;  Surgeon: Ivin Poot, MD;  Location: [facility];  Service: Open Heart Surgery;  Laterality: N/A;  . HERNIA REPAIR    . KNEE ARTHROSCOPY Left   . LEFT HEART CATHETERIZATION WITH CORONARY ANGIOGRAM N/A 01/09/2015   Procedure: LEFT HEART CATHETERIZATION WITH CORONARY ANGIOGRAM;  Surgeon: Burnell Blanks, MD;  LAD 99/90%, D1 50%, CFX 40%, OM 70%, OM branch 80%, RCA  50%, dRCA 99%, PDA 50%, EF 50%  . TEE WITHOUT CARDIOVERSION N/A 01/13/2015   Procedure: TRANSESOPHAGEAL ECHOCARDIOGRAM (TEE);  Surgeon: Ivin Poot, MD;  Location: [facility];  Service: Open Heart Surgery;  Laterality: N/A;  . TOTAL KNEE ARTHROPLASTY Right 02/02/2013   Procedure: TOTAL KNEE ARTHROPLASTY;  Surgeon: Yvette Rack., MD;  Location: [facility];  Service: Orthopedics;  Laterality: Right;  RIGHT ARTHROPLASTY KNEE MEDIAL/LATERAL COMPARTMENTS WITH PATELLA RESURFACING   . UMBILICAL HERNIA REPAIR      Medications: Prior to Admission medications   Medication Sig Start Date End Date Taking? Authorizing Provider  acetaminophen (TYLENOL) 500 MG tablet Take 1,000 mg by mouth every 6 (six) hours as needed for pain. Reported on 06/21/2016   Yes Historical Provider, MD  allopurinol (ZYLOPRIM) 100 MG tablet Take 50 mg by mouth daily.  04/14/13  Yes Historical Provider, MD  ALPRAZolam Duanne Moron) 0.5 MG tablet Take 0.5-1 mg by mouth 4 (four) times daily as needed for anxiety or sleep. Take .5mg -1mg  by mouth three times daily and 1mg  at bedtime as needed 10/08/16  Yes Historical Provider, MD  amoxicillin-clavulanate (AUGMENTIN) 875-125 MG tablet Take 1 tablet by mouth 2 (two) times daily. 11/10/16  Yes Heath Lark, MD  Calcium-Vitamin D (CALTRATE 600 PLUS-VIT D PO) Take 1 tablet by mouth 2 (two) times daily.    Yes Historical Provider, MD  ciprofloxacin (CIPRO) 250 MG tablet Take 1 tablet (250 mg total) by mouth 2 (two) times daily. 11/10/16  Yes Heath Lark, MD  guaiFENesin-codeine (ROBITUSSIN AC) 100-10 MG/5ML syrup Take 5 mLs by mouth 3 (three) times daily as needed for cough. 11/10/16  Yes Heath Lark, MD  lisinopril-hydrochlorothiazide (PRINZIDE,ZESTORETIC) 10-12.5 MG tablet take 1/2 tablet by mouth once daily 11/08/16  Yes Jettie Booze, MD  loratadine (CLARITIN) 10 MG tablet Take 10 mg by mouth daily.   Yes Historical Provider, MD   metoprolol (LOPRESSOR) 50 MG tablet take 1/2 tablet by mouth twice a day 10/22/16  Yes Jettie Booze, MD  MULTIPLE VITAMINS-MINERALS PO Take 1 tablet by mouth 2 (two) times daily. Reported on 06/21/2016   Yes Historical Provider, MD  NITROSTAT 0.4 MG SL tablet Place 0.4 mg under the tongue every 5 (five) minutes as needed. Reported on 06/21/2016 12/31/14  Yes Historical Provider, MD  Omega-3 Fatty Acids (FISH OIL) 1000 MG CAPS Take 2,000 mg by mouth 2 (two) times daily.   Yes Historical Provider, MD  oxyCODONE (ROXICODONE) 15 MG immediate release tablet Take 1 tablet (15 mg total) by mouth every 4 (four) hours as needed. 11/10/16  Yes Heath Lark, MD  predniSONE (DELTASONE) 5 MG tablet Take 5 mg by mouth 2 (two) times daily. 10/21/16  Yes Historical Provider, MD  traMADol (ULTRAM) 50 MG tablet Take 25 mg by mouth every 4 (four) hours as needed for pain.    Yes Historical Provider, MD  cyclobenzaprine (FLEXERIL) 10 MG tablet Take 1 tablet (10 mg total) by mouth 3 (three) times daily as needed  for muscle spasms. 05/20/16   Heath Lark, MD    Allergies:  No Known Allergies  Social History:  reports that he quit smoking about 46 years ago. His smoking use included Cigarettes. He has never used smokeless tobacco. He reports that he does not drink alcohol or use drugs.  Family History: Family History  Problem Relation Age of Onset  . Brain cancer Mother   . Heart disease Father     open heart surgery  . Heart attack Father   . Breast cancer Sister   . Heart disease Brother     open heart surgery  . Diabetes Brother   . Heart attack Brother   . Stroke Neg Hx     Physical Exam: Blood pressure 106/74, pulse (!) 138, temperature 98.9 F (37.2 C), temperature source Rectal, resp. rate (!) 33, height 5\' 7"  (1.702 m), weight 68 kg (150 lb), SpO2 96 %. General: Alert, awake, oriented x3, in no acute distress. HEENT: normocephalic, atraumatic, anicteric sclera, pink conjunctiva, pupils equal and  reactive to light and accomodation, oropharynx clear Neck: supple, no masses or lymphadenopathy, no goiter, no bruits  Heart: Irregularly irregular, tachycardia . Lungs: Bibasilar crackles Abdomen: Soft, nontender, nondistended, positive bowel sounds, no masses. Extremities: No clubbing, cyanosis, 2+ pitting edema. Neuro: Grossly intact, no focal neurological deficits, strength 5/5 upper and lower extremities bilaterally Psych: alert and oriented x 3, normal mood and affect Skin: no rashes or lesions, warm and dry   LABS on Admission:  Basic Metabolic Panel:  Recent Labs Lab 11/20/2016 0839  NA 132*  K 4.3  CL 101  CO2 19*  GLUCOSE 115*  BUN 49*  CREATININE 0.88  CALCIUM 8.5*   Liver Function Tests:  Recent Labs Lab 11/20/2016 0839  AST 330*  ALT 315*  ALKPHOS 88  BILITOT 3.1*  PROT 6.8  ALBUMIN 3.4*   No results for input(s): LIPASE, AMYLASE in the last 168 hours. No results for input(s): AMMONIA in the last 168 hours. CBC:  Recent Labs Lab 11/10/16 0850 11-20-16 0839  WBC 12.4* 15.2*  NEUTROABS 6.3 10.9*  HGB 7.4* 9.1*  HCT 22.7* 26.4*  MCV 84.2 83.5  PLT 20* 25*   Cardiac Enzymes: No results for input(s): CKTOTAL, CKMB, CKMBINDEX, TROPONINI in the last 168 hours. BNP: Invalid input(s): POCBNP CBG: No results for input(s): GLUCAP in the last 168 hours.  Radiological Exams on Admission:  No results found.  *I have personally reviewed the images above*  EKG: Independently reviewed.EKG rate 140, T-wave inversions in 2, 3 aVF and lateral leads,   Assessment/Plan Principal Problem:   NSTEMI (non-ST elevated myocardial infarction) ([place]), chest pain - Troponin 9.18 with EKG changes T-wave inversions in 2, 3, aVF and lateral leads with BNP 1220.7 and atrial fibrillation with RVR, acute CHF - Chest pain has currently resolved - Difficult situation, platelets 25K, unable to place on antiplatelet agents, BP borderline, will await cardiology evaluation and  recommendations regarding further management. No heparin drip secondary to profound thrombocytopenia. Discussed in detail with the patient and wife at the bedside, they understand overall guarded prognosis.   Active Problems:   MDS (myelodysplastic syndrome), low grade ([place]), pancytopenia with thrombocytopenia - Seen by Dr. Alvy Bimler on 12/6, had received 2 units packed RBC transfusion - Hemoglobin currently stable, per Dr Alvy Bimler, no platelet transfusion unless patient is bleeding    Sepsis ([place]) with HCAP - Obtain blood cultures, urine legionella antigen, strep antigen, influenza PCR - Placed on IV vancomycin and cefepime  Atrial fibrillation with RVR ([facility]) - At the time of my encounter, heart rate is controlled in 90s -  cardiology has been consulted, will follow recommendations    Acute CHF (congestive heart failure) ([place]) - Likely precipitated due to #1 and atrial fibrillation with RVR  - Unfortunately cannot place on IV Lasix due to BP, will await cardiology recommendations  - Serial cardiac enzymes, strict I's and O's and daily weights  - Follow 2-D echo   DVT prophylaxis:  SCDs   CODE STATUS:  discussed in detail with the patient, DNR/DNI   Consults called: cardiology  Family Communication: Admission, patients condition and plan of care including tests being ordered have been discussed with the patient and wife who indicates understanding and agree with the plan and Code Status  Admission status: inpatient  Disposition plan: Further plan will depend as patient's clinical course evolves and further radiologic and laboratory data become available.   At the time of admission, it appears that the appropriate admission status for this patient is INPATIENT . This is judged to be reasonable and necessary in order to provide the required intensity of service to ensure the patient's safety given the presenting symptoms, physical exam findings, and initial radiographic and laboratory  data in the context of their chronic comorbidities.      Time Spent on Admission: 54mins   RAI,RIPUDEEP M.D. [facility] Nov 17, 2016, 10:47 AM Pager: [phone]  If 7PM-7AM, please contact night-coverage [email or web address] Password [email or web address]

## 2016-12-06 NOTE — ED Notes (Signed)
Abnormal labs result MD kohut & PA lisa have been made aware

## 2016-12-06 NOTE — Progress Notes (Signed)
NO CHARGE NOTE  I was outside room of Mr. Lembcke's at time of code.  Accompanied family to ICU consult room to provide supportive presence.  Family reports that his family and faith are the most important things to him.  We discussed that he had been talking with his wife earlier today about dying.    Dr. Waldron Labs joined [place] and updated family on current condition.  We discussed that he would not want continued attempts at resuscitation if he does not have chance of functional recovery.  Family would like to continue with current therapies (he has been intubated and placed on pressors), but no further escalation of care or chest compressions in the event of another cardiac arrest.  Dr. Waldron Labs updated code status.  Micheline Rough, MD [facility] [phone]

## 2016-12-06 NOTE — ED Notes (Signed)
Hospitalist at bedside. Went to administer antibiotics, Was told MD to wait til she gets done with patient.

## 2016-12-06 NOTE — Consult Note (Signed)
PULMONARY / CRITICAL CARE MEDICINE   Name: Eric [name] MRN: [id number] DOB: [birth year]    ADMISSION DATE:  2016-12-04 CONSULTATION DATE:  12-04-16   REFERRING MD:  Dr. Tana Coast  CHIEF COMPLAINT:  S/P Cardiac arrest  HISTORY OF PRESENT ILLNESS:   Pt presented to the emergency room with chest pain. He is a previous smoker, not known to have COPD. He has history of CAD for which he had bypass 3 years ago. Patient also with myelodysplastic syndrome which has been difficult to control and patient requiring frequent transfusion, presented to the emergency room with chest pain waking him up.  He was diagnosed with an STEMI. His initial troponin was 3. Subsequent troponin was 8.79 . Cardiology was consulted. Patient's platelets have been chronically low. On admission, they were 25. He also presented with azotemia. Catheterization was held off because of the low plt  and increased risk of bleeding. He was not given medicines for the  NSTEMI. Chest pain resolved at the ED.  He was admitted to the stepdown unit. Patient ended up walking to the bathroom and collapsed after that. He went into cardiac arrest. Initial rhythm was VTACH w/o pulse and he was shocked.  Subsequent rhythms for V. Tach but with a pulse. Amiodarone drip was started. He had CPR. ROSC was 25 minutes.   By the time I saw him, patient looked moribund, agonal breathing, mottled. He was started on low-dose levophed at 5 mcg/kg/min and his blood pressure was [id number] systolic. Cardiology updated patient's family.   PAST MEDICAL HISTORY :  He  has a past medical history of Anemia; CAD (coronary artery disease), native coronary artery (01/09/2015); Dysrhythmia; Family history of ischemic heart disease; Gout; Headache (03/07/2014); Hypertension; MDS (myelodysplastic syndrome), low grade ([place]) (2012); Muscle spasm of back (04/05/2016); Needs flu shot (08/23/2013); Rheumatoid arthritis(714.0); Shortness of breath; and Tachycardia (2013).  PAST SURGICAL  HISTORY: He  has a past surgical history that includes Knee arthroscopy (Left); Hernia repair; Umbilical hernia repair; Circumcision; Total knee arthroplasty (Right, 02/02/2013); left heart catheterization with coronary angiogram (N/A, 01/09/2015); Coronary artery bypass graft (N/A, 01/13/2015); and TEE without cardioversion (N/A, 01/13/2015).  No Known Allergies  Current Facility-Administered Medications on File Prior to Encounter  Medication  . 0.9 %  sodium chloride infusion   Current Outpatient Prescriptions on File Prior to Encounter  Medication Sig  . acetaminophen (TYLENOL) 500 MG tablet Take 1,000 mg by mouth every 6 (six) hours as needed for pain. Reported on 06/21/2016  . allopurinol (ZYLOPRIM) 100 MG tablet Take 50 mg by mouth daily.   Marland Kitchen amoxicillin-clavulanate (AUGMENTIN) 875-125 MG tablet Take 1 tablet by mouth 2 (two) times daily.  . Calcium-Vitamin D (CALTRATE 600 PLUS-VIT D PO) Take 1 tablet by mouth 2 (two) times daily.   . ciprofloxacin (CIPRO) 250 MG tablet Take 1 tablet (250 mg total) by mouth 2 (two) times daily.  Marland Kitchen guaiFENesin-codeine (ROBITUSSIN AC) 100-10 MG/5ML syrup Take 5 mLs by mouth 3 (three) times daily as needed for cough.  Marland Kitchen lisinopril-hydrochlorothiazide (PRINZIDE,ZESTORETIC) 10-12.5 MG tablet take 1/2 tablet by mouth once daily  . loratadine (CLARITIN) 10 MG tablet Take 10 mg by mouth daily.  . metoprolol (LOPRESSOR) 50 MG tablet take 1/2 tablet by mouth twice a day  . MULTIPLE VITAMINS-MINERALS PO Take 1 tablet by mouth 2 (two) times daily. Reported on 06/21/2016  . NITROSTAT 0.4 MG SL tablet Place 0.4 mg under the tongue every 5 (five) minutes as needed. Reported on 06/21/2016  . Omega-3  Fatty Acids (FISH OIL) 1000 MG CAPS Take 2,000 mg by mouth 2 (two) times daily.  Marland Kitchen oxyCODONE (ROXICODONE) 15 MG immediate release tablet Take 1 tablet (15 mg total) by mouth every 4 (four) hours as needed.  . traMADol (ULTRAM) 50 MG tablet Take 25 mg by mouth every 4 (four) hours as  needed for pain.   . cyclobenzaprine (FLEXERIL) 10 MG tablet Take 1 tablet (10 mg total) by mouth 3 (three) times daily as needed for muscle spasms.    FAMILY HISTORY:  His indicated that his mother is deceased. He indicated that his father is deceased. He indicated that his sister is alive. He indicated that only one of his two brothers is alive. He indicated that his maternal grandmother is deceased. He indicated that his maternal grandfather is deceased. He indicated that his paternal grandmother is deceased. He indicated that his paternal grandfather is deceased. He indicated that the status of his neg hx is unknown.    SOCIAL HISTORY: He  reports that he quit smoking about 46 years ago. His smoking use included Cigarettes. He has never used smokeless tobacco. He reports that he does not drink alcohol or use drugs.  REVIEW OF SYSTEMS:   As above  SUBJECTIVE:  As above  VITAL SIGNS: BP 106/65   Pulse 98   Temp 98.9 F (37.2 C) (Rectal)   Resp (!) 29   Ht 5\' 7"  (1.702 m)   Wt 68 kg (150 lb)   SpO2 100%   BMI 23.49 kg/m   HEMODYNAMICS:    VENTILATOR SETTINGS: FiO2 (%):  [100 %] 100 % Set Rate:  [14 bmp] 14 bmp Vt Set:  [530 mL] 530 mL PEEP:  [5 cmH20] 5 cmH20 Plateau Pressure:  [26 cmH20] 26 cmH20  INTAKE / OUTPUT: No intake/output data recorded.  PHYSICAL EXAMINATION: General:  Agonal breathing. Dyssynchronous with the event. Gasping. In mild distress. Neuro:  CN grossly intact. (-) lateralizing signs HEENT:  ETT in place. Prominent neck veins Cardiovascular:  Tachycardic. (-) m/s/r/g Lungs:  Fair ae.  Crackles on BLF. (-) wheezing/rhonchi Abdomen:  Distended abd. Dec BS, soft. (-) masses/tenderness Musculoskeletal:  Gr 1 edema Skin:  Mottled extremites. Cool distal extremities.   LABS:  BMET  Recent Labs Lab 29-Nov-2016 0839 2016-11-29 1600  NA 132* PENDING  K 4.3 PENDING  CL 101 PENDING  CO2 19* PENDING  BUN 49* 50*  CREATININE 0.88 1.02  GLUCOSE 115*  147*    Electrolytes  Recent Labs Lab 29-Nov-2016 0839 Nov 29, 2016 1600  CALCIUM 8.5* PENDING  MG  --  2.2    CBC  Recent Labs Lab 11/10/16 0850 11-29-16 0839 2016/11/29 1600  WBC 12.4* 15.2* 18.4*  HGB 7.4* 9.1* 9.1*  HCT 22.7* 26.4* 26.5*  PLT 20* 25* 26*    Coag's  Recent Labs Lab 11-29-2016 0839 2016/11/29 1600  APTT 46*  --   INR 2.05 1.87    Sepsis Markers  Recent Labs Lab 11/29/2016 0852 Nov 29, 2016 1143  LATICACIDVEN 2.74* 3.43*    ABG  Recent Labs Lab 11/29/2016 1635  PHART 7.090*  PCO2ART 43.6  PO2ART 65.8*    Liver Enzymes  Recent Labs Lab 2016/11/29 0839  AST 330*  ALT 315*  ALKPHOS 88  BILITOT 3.1*  ALBUMIN 3.4*    Cardiac Enzymes  Recent Labs Lab 29-Nov-2016 1427 November 29, 2016 1600  TROPONINI 8.79* 8.82*    Glucose  Recent Labs Lab November 29, 2016 1556  GLUCAP 151*    Imaging Dg Chest Port 1 [address]  Result Date: 13-Nov-2016 CLINICAL DATA:  Increased shortness of breath and difficulty breathing. EXAM: PORTABLE CHEST 1 VIEW COMPARISON:  11/10/2016 FINDINGS: Again noted are bilateral lung densities, right side greater than left. Pattern is most suggestive for pulmonary edema with mildly confluent areas of airspace disease in the right central lung. The linear densities or Kerley B-lines noted on the previous examination are less conspicuous on this examination. Heart size remains upper limits of normal. Evidence of prior CABG. Atherosclerotic calcifications at the aortic arch. IMPRESSION: Asymmetric lung disease is suggestive for pulmonary edema. Overall, the pulmonary edema has slightly decreased from 11/10/2016 but cannot exclude minimal progression in the central aspect of the right lung. Cannot exclude a secondly infectious process. Electronically Signed   By: Markus Daft M.D.   On: 11/13/16 08:45     CULTURES:  Blood 12/8 > Sputum 12/8 > MRSA 12/8 >    ANTIBIOTICS: Cefepime 12/8 > Vanc 12/8 >   SIGNIFICANT EVENTS: 12/8 admit with NSTEMI  but no active management as plt was 25 2/2 MDS. Arrest and ROSC 25 mins   LINES/TUBES:   DISCUSSION: 71 year old male, known to have CAD, status post bypass years ago, with MDS, with low platelet count related to this, comes in with worsening dyspnea and chest pain. Patient presented with NSTEMI but no active management done with heparin or catheterization because of increased risk of bleeding with a low platelet count. Patient went into Vfib/vtach arrest and ROSC was 25 minutes.   Assessment : 1. Acute hypoxemic hypercapnic respiratory failure secondary to NSTEMI, Pulmonary edema, concern for healthcare associated pneumonia would recent hospital and office visits. 2. S/P V. Tach/Vfib arrest likely 2/2 to above. Afib.  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 Lactic acidosis 5. AKI/Azotemia 6. Transaminitis 2/2 #1 7. Severe thrombocytopenia. MDS.  Plan : 1. I extensively discussed the plan of care with the patient's wife, 2 sons, daughter-in-law. Family decided to make him a full DO NOT RESUSCITATE with no escalation of care, no additional pressors. They also do not want any invasive procedures. Family will decide the next 12-24 hours regarding goals of care. If he is still with [place] tomorrow, they will most likely extubate him terminally and switching to comfort care. 2. Continue ventilatory support for now. 3. Continue amiodarone drip. 4. Continue levo fed drip. He is currently on a low-dose at 5 mcg/kg/m. 5. ABG shows severe acidosis. We'll give him 2 amps of bicarbonate. Will switch his fluids to D5 water with bicarbonate at 50 mls an hour. 6. Continue broad-spectrum antibiotics for now. I think the driving force behind the code is his cardiac issues. 7. PRN  Versed and fentanyl to make him comfortable. 8. Hold off on TF.    Critical care time with this patient today: 35 minutes.  Monica Becton, MD [facility] Pager: [phone] After 3 pm or if no response, call [phone]  11/13/2016, 5:05 PM

## 2016-12-06 DEATH — deceased

## 2016-12-13 ENCOUNTER — Other Ambulatory Visit: Payer: Medicare Other

## 2016-12-24 ENCOUNTER — Other Ambulatory Visit: Payer: Medicare Other

## 2017-01-03 ENCOUNTER — Other Ambulatory Visit: Payer: Medicare Other

## 2017-01-03 ENCOUNTER — Ambulatory Visit: Payer: Medicare Other | Admitting: Hematology and Oncology

## 2017-01-21 IMAGING — DX DG CHEST 2V
2 series · 2 of 2 positions shown · non-contrast
Comparison: 11/16/2015

CLINICAL DATA: Myelodysplastic syndrome with shortness of breath
and cough.

EXAM:
CHEST  2 VIEW

[chest pa]
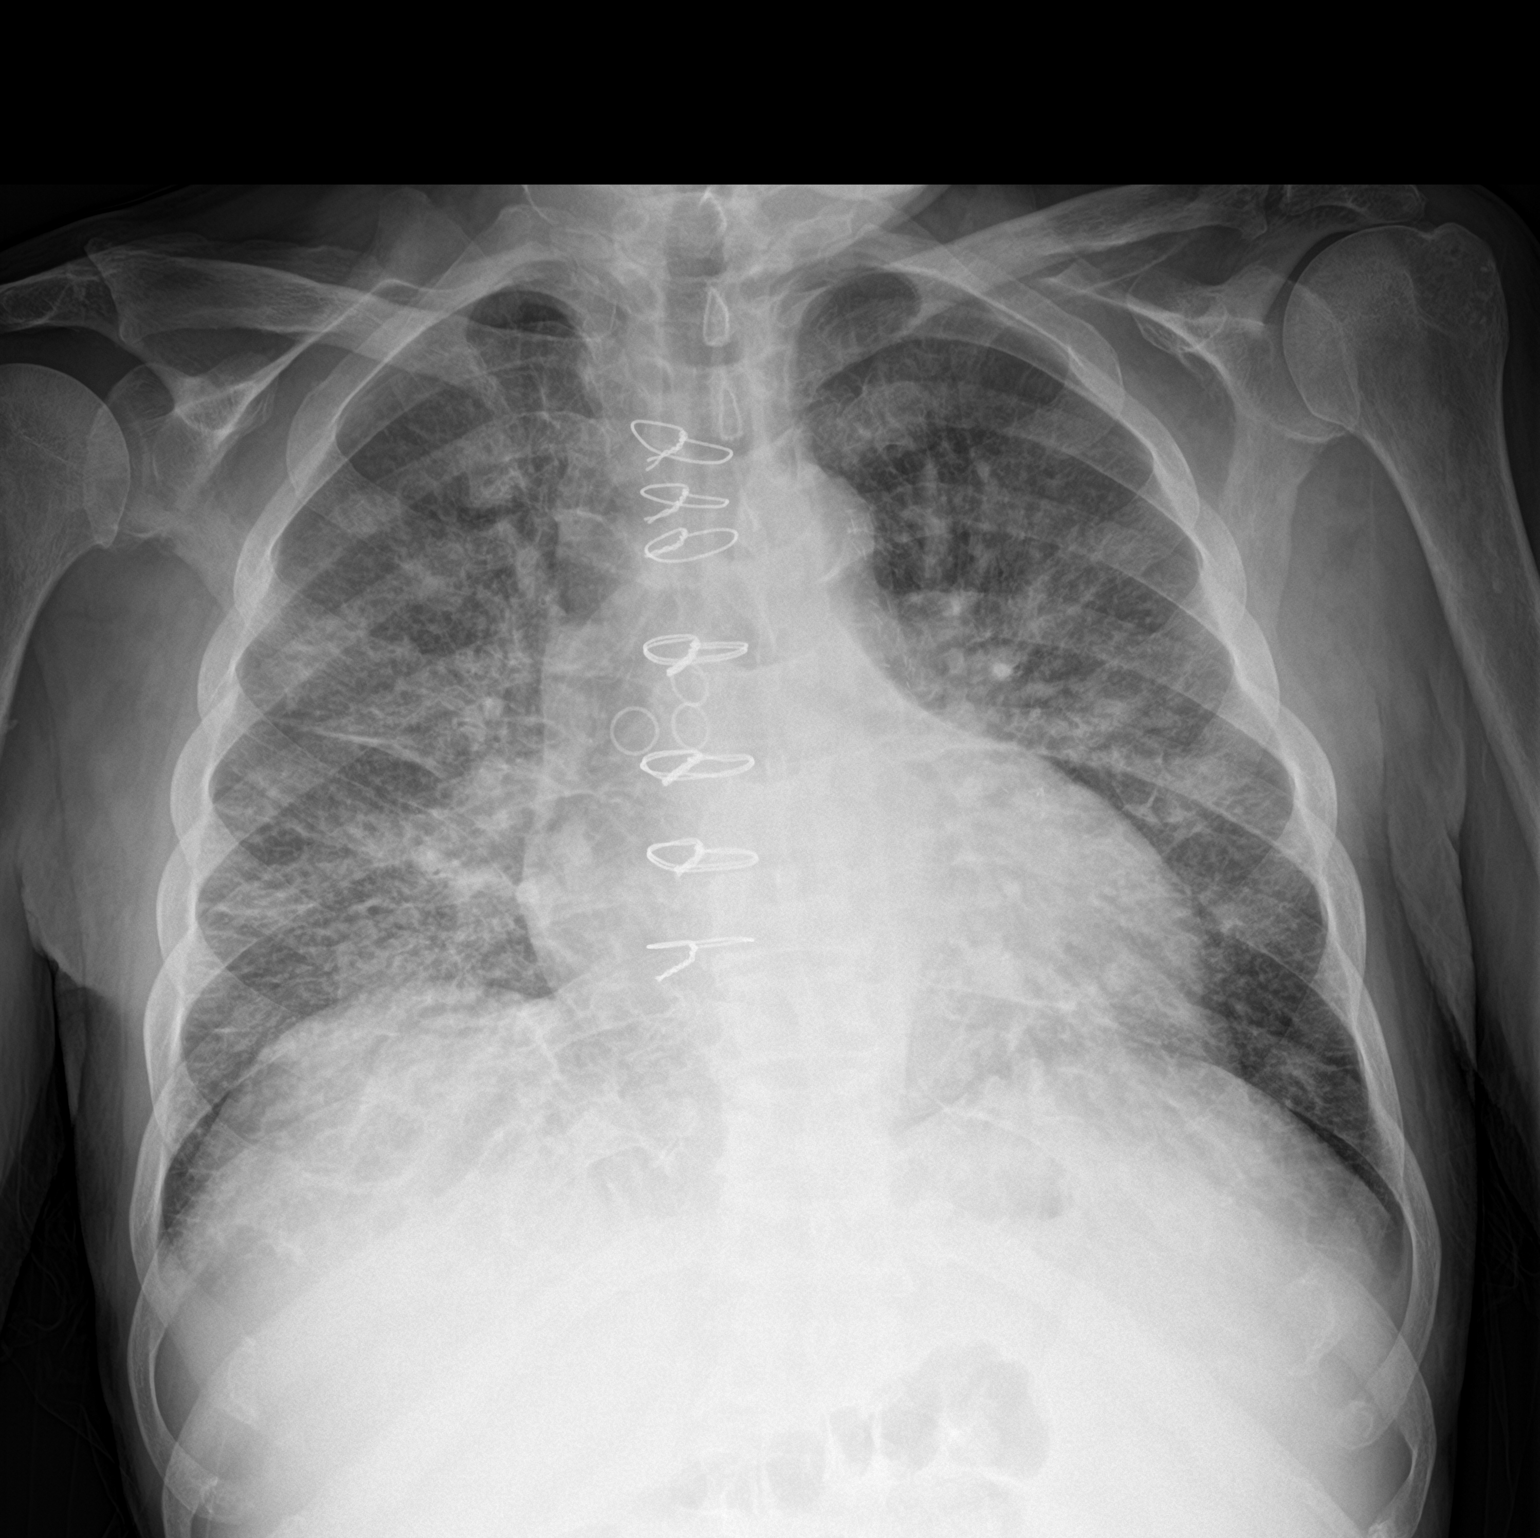

[chest lat]
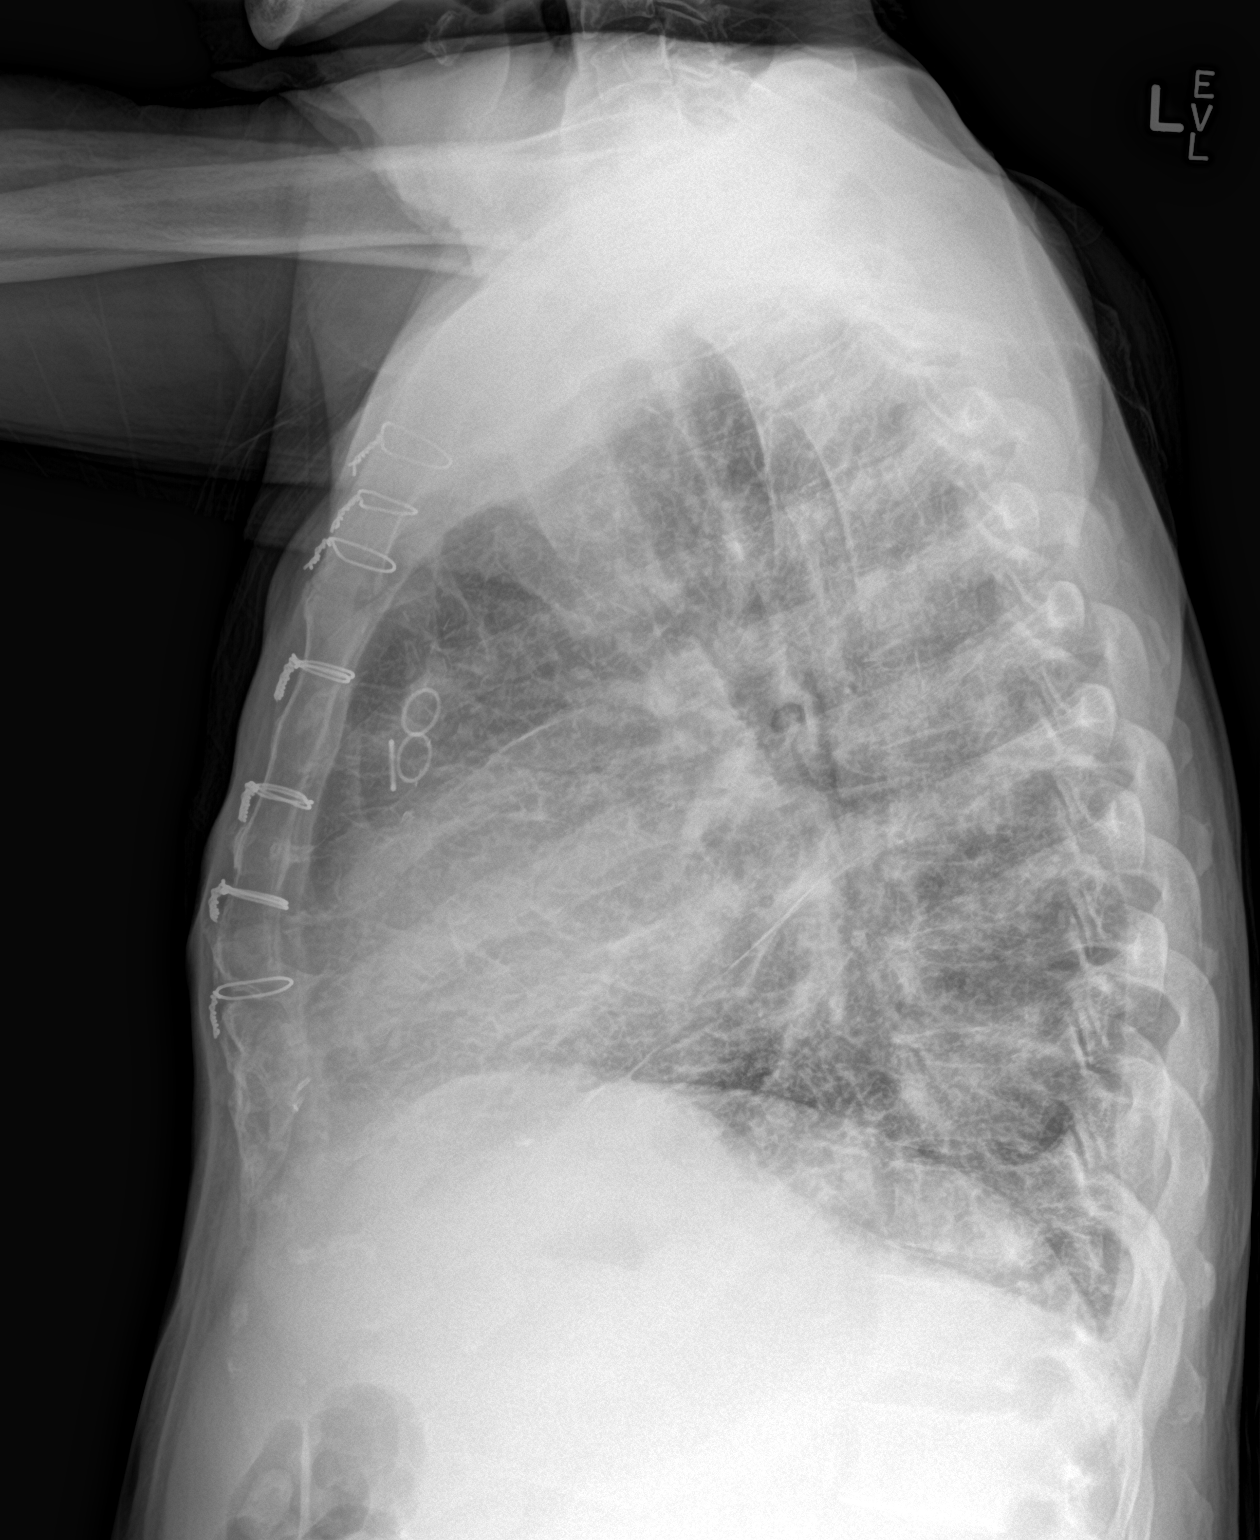

[2 of 2 positions shown; findings below may reference images not displayed]

FINDINGS: The cardio pericardial silhouette is enlarged. Symmetric, diffuse
interstitial and central alveolar opacity suggests edema. No
substantial pleural effusion. The visualized bony structures of the
thorax are intact. Patient is status post CABG.
IMPRESSION: Cardiomegaly with diffuse interstitial and central alveolar opacity
suggests edema. Diffuse infection cannot be excluded.

## 2017-01-23 IMAGING — DX DG CHEST 1V PORT
1 series · 1 of 1 positions shown · non-contrast
Comparison: 11/10/2016

CLINICAL DATA: Increased shortness of breath and difficulty
breathing.

EXAM:
PORTABLE CHEST 1 VIEW

[chest ap]
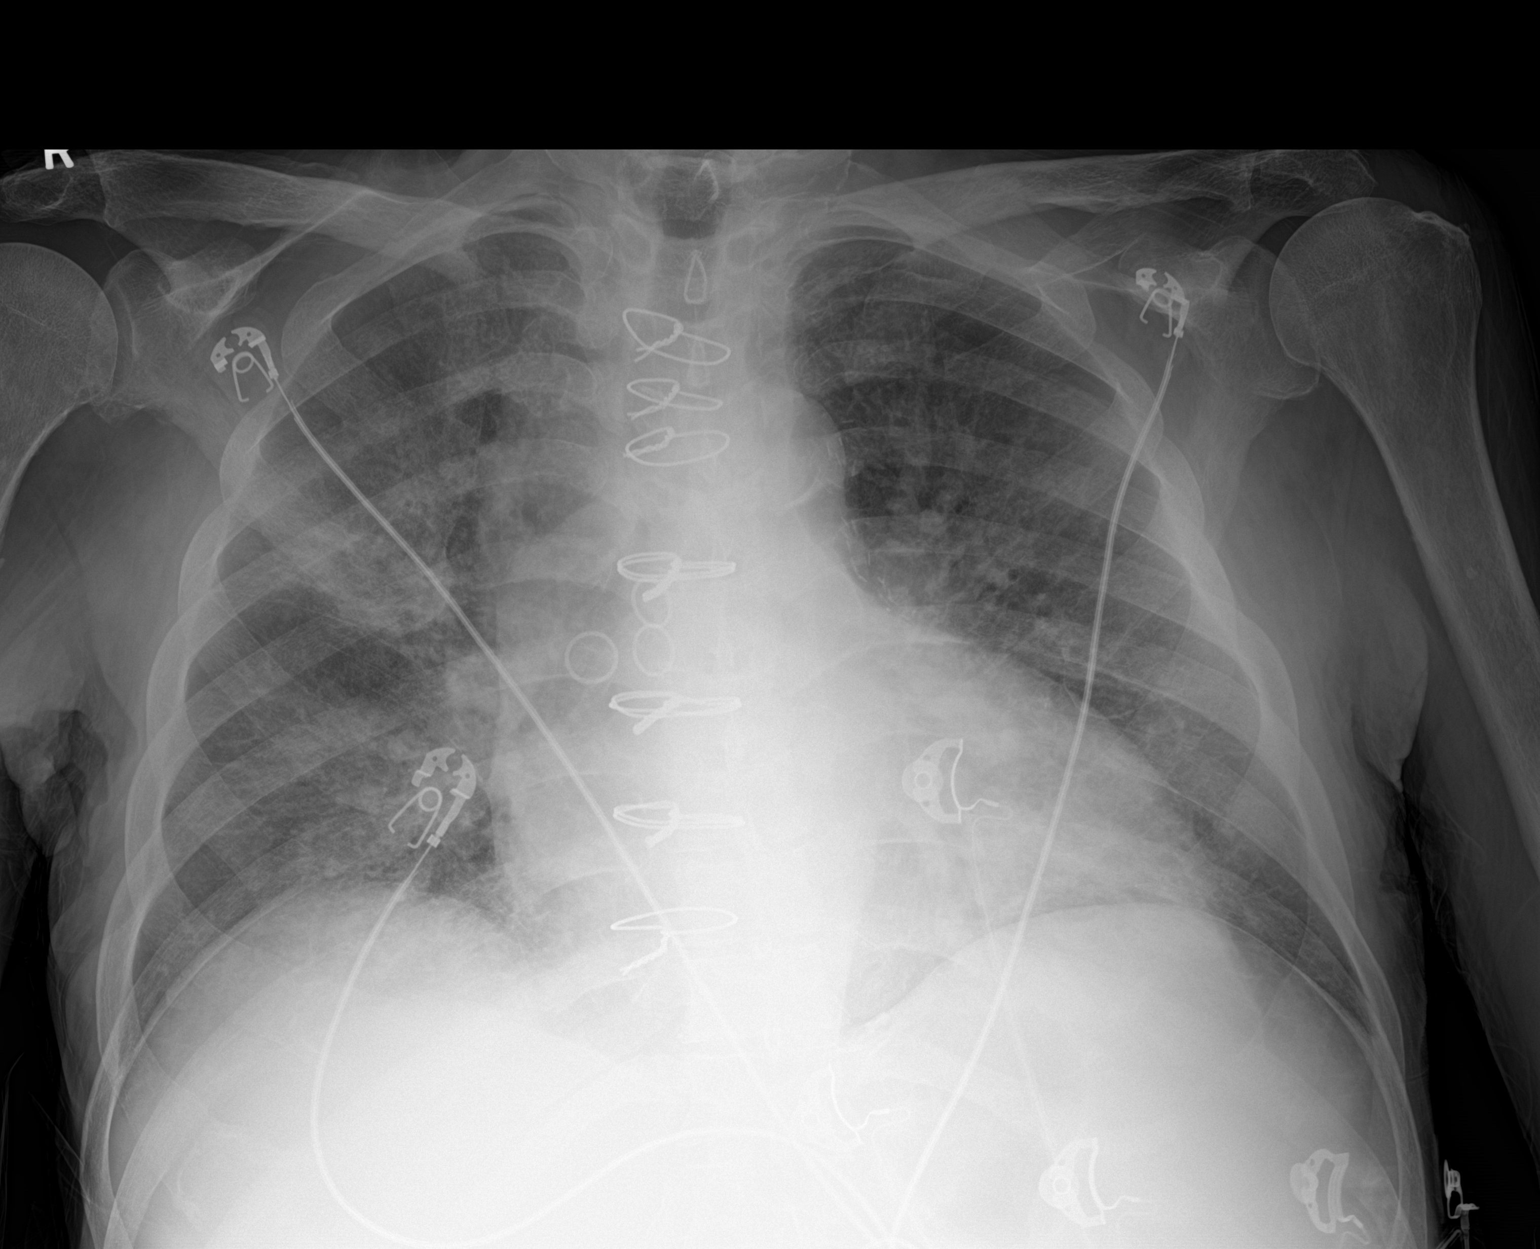

[1 of 1 positions shown; findings below may reference images not displayed]

FINDINGS: Again noted are bilateral lung densities, right side greater than
left. Pattern is most suggestive for pulmonary edema with mildly
confluent areas of airspace disease in the right central lung. The
linear densities or Kerley B-lines noted on the previous examination
are less conspicuous on this examination. Heart size remains upper
limits of normal. Evidence of prior CABG. Atherosclerotic
calcifications at the aortic arch.
IMPRESSION: Asymmetric lung disease is suggestive for pulmonary edema. Overall,
the pulmonary edema has slightly decreased from 11/10/2016 but
cannot exclude minimal progression in the central aspect of the
right lung. Cannot exclude a secondly infectious process.

## 2019-04-20 ENCOUNTER — Encounter
